# Patient Record
Sex: Male | Born: 1970 | Race: White | Hispanic: No | Smoking: Current every day smoker
Health system: Southern US, Community
[De-identification: ages and names within clinical notes are randomized; demographics above are authoritative.]

## PROBLEM LIST (undated history)

## (undated) DIAGNOSIS — F4310 Post-traumatic stress disorder, unspecified: Secondary | ICD-10-CM

## (undated) DIAGNOSIS — F17200 Nicotine dependence, unspecified, uncomplicated: Secondary | ICD-10-CM

## (undated) DIAGNOSIS — F419 Anxiety disorder, unspecified: Secondary | ICD-10-CM

## (undated) DIAGNOSIS — F32A Depression, unspecified: Secondary | ICD-10-CM

## (undated) DIAGNOSIS — F3181 Bipolar II disorder: Secondary | ICD-10-CM

## (undated) DIAGNOSIS — F1010 Alcohol abuse, uncomplicated: Secondary | ICD-10-CM

## (undated) DIAGNOSIS — F329 Major depressive disorder, single episode, unspecified: Secondary | ICD-10-CM

## (undated) HISTORY — PX: LYMPH NODE DISSECTION: SHX5087

## (undated) HISTORY — PX: HERNIA REPAIR: SHX51

---

## 2002-08-25 ENCOUNTER — Emergency Department (HOSPITAL_COMMUNITY): Admission: EM | Admit: 2002-08-25 | Discharge: 2002-08-25 | Payer: Self-pay | Admitting: Emergency Medicine

## 2003-04-25 ENCOUNTER — Encounter: Payer: Self-pay | Admitting: Emergency Medicine

## 2003-04-25 ENCOUNTER — Emergency Department (HOSPITAL_COMMUNITY): Admission: EM | Admit: 2003-04-25 | Discharge: 2003-04-25 | Payer: Self-pay | Admitting: Emergency Medicine

## 2004-11-06 ENCOUNTER — Emergency Department (HOSPITAL_COMMUNITY): Admission: EM | Admit: 2004-11-06 | Discharge: 2004-11-06 | Payer: Self-pay | Admitting: Emergency Medicine

## 2005-08-25 ENCOUNTER — Inpatient Hospital Stay (HOSPITAL_COMMUNITY): Admission: RE | Admit: 2005-08-25 | Discharge: 2005-08-27 | Payer: Self-pay | Admitting: Psychiatry

## 2005-08-25 ENCOUNTER — Emergency Department (HOSPITAL_COMMUNITY): Admission: EM | Admit: 2005-08-25 | Discharge: 2005-08-25 | Payer: Self-pay | Admitting: Emergency Medicine

## 2005-09-01 ENCOUNTER — Other Ambulatory Visit (HOSPITAL_COMMUNITY): Admission: RE | Admit: 2005-09-01 | Discharge: 2005-09-25 | Payer: Self-pay | Admitting: Psychiatry

## 2005-09-01 ENCOUNTER — Ambulatory Visit: Payer: Self-pay | Admitting: Psychiatry

## 2009-02-07 ENCOUNTER — Ambulatory Visit (HOSPITAL_COMMUNITY): Admission: RE | Admit: 2009-02-07 | Discharge: 2009-02-07 | Payer: Self-pay | Admitting: General Surgery

## 2009-06-04 ENCOUNTER — Inpatient Hospital Stay (HOSPITAL_COMMUNITY): Admission: RE | Admit: 2009-06-04 | Discharge: 2009-06-05 | Payer: Self-pay | Admitting: Psychiatry

## 2009-06-04 ENCOUNTER — Ambulatory Visit: Payer: Self-pay | Admitting: Psychiatry

## 2009-06-04 ENCOUNTER — Emergency Department (HOSPITAL_COMMUNITY): Admission: EM | Admit: 2009-06-04 | Discharge: 2009-06-04 | Payer: Self-pay | Admitting: Emergency Medicine

## 2009-10-24 ENCOUNTER — Ambulatory Visit (HOSPITAL_COMMUNITY): Payer: Self-pay | Admitting: Psychiatry

## 2010-03-15 ENCOUNTER — Ambulatory Visit: Payer: Self-pay | Admitting: Psychiatry

## 2010-12-23 ENCOUNTER — Inpatient Hospital Stay (HOSPITAL_COMMUNITY)
Admission: EM | Admit: 2010-12-23 | Discharge: 2010-12-25 | DRG: 897 | Disposition: A | Discharge status: Other Acute Inpatient Hospital | Payer: 59 | Attending: Internal Medicine | Admitting: Internal Medicine

## 2010-12-23 DIAGNOSIS — R45851 Suicidal ideations: Secondary | ICD-10-CM

## 2010-12-23 DIAGNOSIS — E871 Hypo-osmolality and hyponatremia: Secondary | ICD-10-CM | POA: Diagnosis present

## 2010-12-23 DIAGNOSIS — F10939 Alcohol use, unspecified with withdrawal, unspecified: Principal | ICD-10-CM | POA: Diagnosis present

## 2010-12-23 DIAGNOSIS — F10239 Alcohol dependence with withdrawal, unspecified: Principal | ICD-10-CM | POA: Diagnosis present

## 2010-12-23 DIAGNOSIS — F39 Unspecified mood [affective] disorder: Secondary | ICD-10-CM | POA: Diagnosis present

## 2010-12-23 DIAGNOSIS — F1020 Alcohol dependence, uncomplicated: Secondary | ICD-10-CM | POA: Diagnosis present

## 2010-12-23 LAB — DIFFERENTIAL
Basophils Absolute: 0 10*3/uL (ref 0.0–0.1)
Eosinophils Absolute: 0 10*3/uL (ref 0.0–0.7)
Eosinophils Relative: 0 % (ref 0–5)
Lymphocytes Relative: 12 % (ref 12–46)
Lymphs Abs: 1.3 10*3/uL (ref 0.7–4.0)
Monocytes Absolute: 0.5 10*3/uL (ref 0.1–1.0)
Monocytes Relative: 4 % (ref 3–12)

## 2010-12-23 LAB — CBC
HCT: 42.9 % (ref 39.0–52.0)
MCH: 27.5 pg (ref 26.0–34.0)
MCHC: 35 g/dL (ref 30.0–36.0)
MCV: 78.6 fL (ref 78.0–100.0)
Platelets: 211 10*3/uL (ref 150–400)
RBC: 5.46 MIL/uL (ref 4.22–5.81)
RDW: 14.6 % (ref 11.5–15.5)

## 2010-12-23 LAB — COMPREHENSIVE METABOLIC PANEL
ALT: 58 U/L — ABNORMAL HIGH (ref 0–53)
Albumin: 4.5 g/dL (ref 3.5–5.2)
Alkaline Phosphatase: 63 U/L (ref 39–117)
Calcium: 9.2 mg/dL (ref 8.4–10.5)
Creatinine, Ser: 0.79 mg/dL (ref 0.4–1.5)
GFR calc Af Amer: >60 mL/min (ref >60)
GFR calc non Af Amer: >60 mL/min (ref >60)
Potassium: 4 mEq/L (ref 3.5–5.1)
Sodium: 133 mEq/L — ABNORMAL LOW (ref 135–145)
Total Protein: 7.9 g/dL (ref 6.0–8.3)

## 2010-12-23 LAB — POCT CARDIAC MARKERS
CKMB, poc: 1.5 ng/mL (ref 1.0–8.0)
Myoglobin, poc: 76.6 ng/mL (ref 12–200)
Troponin i, poc: <0.05 ng/mL (ref 0.00–0.09)

## 2010-12-23 LAB — RAPID URINE DRUG SCREEN, HOSP PERFORMED
Amphetamines: NOT DETECTED
Benzodiazepines: POSITIVE — AB
Tetrahydrocannabinol: POSITIVE — AB

## 2010-12-23 LAB — ETHANOL: Alcohol, Ethyl (B): 5 mg/dL (ref 0–10)

## 2010-12-24 DIAGNOSIS — F1020 Alcohol dependence, uncomplicated: Secondary | ICD-10-CM

## 2010-12-24 LAB — BASIC METABOLIC PANEL
BUN: 9 mg/dL (ref 6–23)
CO2: 29 mEq/L (ref 19–32)
Calcium: 8.8 mg/dL (ref 8.4–10.5)
Creatinine, Ser: 0.77 mg/dL (ref 0.4–1.5)
GFR calc Af Amer: >60 mL/min (ref >60)
GFR calc non Af Amer: >60 mL/min (ref >60)
Potassium: 3.8 mEq/L (ref 3.5–5.1)
Sodium: 141 mEq/L (ref 135–145)

## 2010-12-24 LAB — LIPID PANEL
Cholesterol: 253 mg/dL — ABNORMAL HIGH (ref 0–200)
HDL: 84 mg/dL (ref >39)
LDL Cholesterol: 156 mg/dL — ABNORMAL HIGH (ref 0–99)
Total CHOL/HDL Ratio: 3 RATIO
Triglycerides: 63 mg/dL (ref <150)

## 2010-12-24 LAB — PHOSPHORUS: Phosphorus: 2.9 mg/dL (ref 2.3–4.6)

## 2010-12-24 LAB — TSH: TSH: 2.992 u[IU]/mL (ref 0.350–4.500)

## 2010-12-24 LAB — MAGNESIUM: Magnesium: 2.3 mg/dL (ref 1.5–2.5)

## 2010-12-24 LAB — VITAMIN B12: Vitamin B-12: 422 pg/mL (ref 211–911)

## 2010-12-24 NOTE — H&P (Signed)
[name], [name]            ACCOUNT NO.:  [id number]  MEDICAL RECORD NO.:  [id number]           PATIENT TYPE:  E  LOCATION:  [facility]                         FACILITY:  [facility]  PHYSICIAN:  Homero Fellers, MD   DATE OF BIRTH:  January 22, 1971  DATE OF ADMISSION:  12/23/2010 DATE OF DISCHARGE:                             HISTORY & PHYSICAL   PRIMARY CARE PHYSICIAN:  Thora Lance, MD  CHIEF COMPLAINT:  Wanting to detox from alcohol and suicide ideation.  This is a 40 year old Caucasian gentleman with heavy history of alcohol abuse.  The patient stated that he typically drinks constantly on a daily basis for 3 weeks in a row and then may be off for several weeks. He drinks about 30 beers a day and has been doing that for the past 2- 1/2 weeks.  Yesterday, he planned to kill himself by just drinking to death.  He said he planned to just drink until he just falls off and die.  Denies any use of weapon or any other organized plan like hanging or overdose of medication.  The patient had prior history of alcohol withdrawal before several months ago.  In the emergency room, he was given several doses of Ativan, totalling about 60 mg in the past 8 hours with some improvement of his symptoms but not completely resolved.  He continued to be tachycardic, agitated, anxious and is beginning to be tremulous.  He was already seen by the Behavioral Medicine Team and as such is not going to look for placement, but because of his daily features of alcohol withdrawal, we have been asked to admit to the Medical Service for management.  The patient complained of vague chest pain, no shortness of breath.  He had some nausea and vomiting with loose stool in the past 1-2 days, but there has been no vomiting since he got to the emergency room.  The patient had no seizure activity.  No leg swelling or palpitation.  Past medical history include alcohol abuse.  MEDICATIONS:  None.  ALLERGIES:   None.  SOCIAL HISTORY:  Currently smokes about half pack a per day, also uses marijuana.  He drinks alcohol as described above.  FAMILY HISTORY:  Unremarkable.  Ten-point review of systems is negative except as described above.  PHYSICAL EXAMINATION:  VITAL SIGNS:  Blood pressure 138/82 to 138/103, pulse of 105-119, respirations 24, temperature is 98.5, O2 sat 98%. GENERAL:  The patient is lying in bed, comfortable, appeared to be in no distress.  He appears slightly anxious and tremulous.  He is awake, alert, and oriented.  No hallucination or aggressive behavior. NECK:  Supple.  No JVD. LUNGS:  Clear to auscultation bilaterally. HEART:  S1 and S2.  No murmurs, rubs, or gallops. ABDOMEN:  Full, soft, nontender.  Bowel sounds present.  No masses. GI:  No edema, clubbing, or cyanosis. NEUROLOGIC:  No focal deficits.  Speech is clear.  Coordination appeared preserved.  He has mild tremor to both hands.  LABORATORY:  Sodium is 133, potassium 4, BUN 11, creatinine 0.79.  AST 56, ALT 58, alcohol level 5.  White count is 10.9, hemoglobin  is 15.0, platelet count is 211,000.  Cardiac enzymes normal.  ASSESSMENT: 1. Alcohol withdrawal. 2. Suicidal ideation. 3. Mild hyponatremia. 4. History of cannabis abuse.  PLAN:  Admit to telemetry.  I will put him on alcohol withdrawal protocol using Ativan and Librium.  He will also be on multivitamin, folic acid, and thiamine.  Vital signs to be closely followed.  The patient will also be on DVT prophylaxis.  I will use hydralazine for elevated blood pressure if condition is fairly stable.  He will definitely need alcohol rehabilitation once he is medically stable.     Homero Fellers, MD     FA/MEDQ  D:  12/23/2010  T:  12/23/2010  Job:  [id number]  Electronically Signed by Homero Fellers  on 12/24/2010 02:20:53 AM

## 2010-12-25 ENCOUNTER — Inpatient Hospital Stay (HOSPITAL_COMMUNITY)
Admission: RE | Admit: 2010-12-25 | Discharge: 2010-12-26 | DRG: 897 | Disposition: A | Discharge status: Home / Self Care | Payer: 59 | Source: Ambulatory Visit | Attending: Psychiatry | Admitting: Psychiatry

## 2010-12-25 DIAGNOSIS — F329 Major depressive disorder, single episode, unspecified: Secondary | ICD-10-CM

## 2010-12-25 DIAGNOSIS — F1210 Cannabis abuse, uncomplicated: Secondary | ICD-10-CM

## 2010-12-25 DIAGNOSIS — F3289 Other specified depressive episodes: Secondary | ICD-10-CM

## 2010-12-25 DIAGNOSIS — F1020 Alcohol dependence, uncomplicated: Principal | ICD-10-CM

## 2010-12-25 DIAGNOSIS — Z560 Unemployment, unspecified: Secondary | ICD-10-CM

## 2010-12-25 DIAGNOSIS — R45851 Suicidal ideations: Secondary | ICD-10-CM

## 2010-12-26 DIAGNOSIS — F1020 Alcohol dependence, uncomplicated: Secondary | ICD-10-CM

## 2010-12-26 NOTE — Discharge Summary (Signed)
[name], [name]            ACCOUNT NO.:  [id number]  MEDICAL RECORD NO.:  [id number]           PATIENT TYPE:  I  LOCATION:  3701                         FACILITY:  [facility]  PHYSICIAN:  Thora Lance, M.D.  DATE OF BIRTH:  05-May-1971  DATE OF ADMISSION:  12/23/2010 DATE OF DISCHARGE:  12/25/2010                              DISCHARGE SUMMARY   REASON FOR ADMISSION:  A 40 year old Caucasian male with history of heavy alcohol abuse who had been drinking up to 30 beers a day over the last 3 weeks prior to admission.  He reported he had planned to kill himself by drinking himself to death.  He does have a history of prior alcohol withdrawal in the past.  In the emergency room, he was given as much as 60 mg of Ativan, but still had significant tachycardia and agitation and anxiety and was admitted for alcohol detox after being seen by Behavioral Medicine.  PHYSICAL EXAMINATION:  Significant findings on exam; VITAL SIGNS:  Blood pressure 138/82, to 138/103, heart rate 105-119, respirations 24, and temperature 98.5. NECK:  Supple.  No JVD. LUNGS:  Were clear. HEART:  Regular rate and rhythm without murmur, gallop, or rub. ABDOMEN:  Soft, nontender.  Normal bowel sounds.  No masses. EXTREMITIES:  No edema. NEUROLOGIC:  No focal deficits.  LABORATORY DATA:  Sodium 133, potassium 4, BUN 11, and creatinine 0.7. AST 56 and ALT 58.  Alcohol level 5.  White count 10.9, hemoglobin 15, and platelet count 211,000.  Cardiac enzymes normal.  HOSPITAL COURSE:  The patient was admitted for alcohol withdrawal detox. He was placed on the alcohol detox protocol using Ativan.  This was supplemented with some p.m. Librium by Psychiatry.  He was given multivitamin, folic acid, and thiamine.  His blood pressure was initially elevated, but at discharge his vital signs had stabilized with last blood pressure 111/66 with a heart rate of 77.  He remained afebrile.  Folate level came back to 6.679  with is high.  B12 level 422. TSH 2.9.  The patient was seen by the Psychiatric Service.  The impression was chronic alcohol dependence and mood disorder, not otherwise specified.  Rule out bipolar disorder, which had been a diagnosis in the past.  Ativan detox protocol was continued, Librium was added at bedtime, and treatment options were discussed with the patient. The patient was transferred to [facility] for further stabilization and then from there would be transferred to rehab.  The patient was medically stable at the time of discharge, there was no significant medical issues.  DISCHARGE DIAGNOSES: 1. Alcoholism. 2. Alcohol withdrawal. 3. Mood disorder, rule out bipolar disorder.  PROCEDURES:  None.  DISCHARGE MEDICATIONS: 1. Multivitamin 1 p.o. daily. 2. Thiamine 100 mg one p.o. daily. 3. Folic acid 1 mg p.o. daily. 4. Psychiatric meds as per [facility].  DISPOSITION:  Discharge to [facility].  CODE STATUS:  Full code.  DIET:  Regular diet.  ACTIVITY:  As tolerated.         ______________________________ Thora Lance, M.D.     JJG/MEDQ  D:  12/25/2010  T:  12/25/2010  Job:  [id number]  Electronically Signed by Kirby Funk M.D. on 12/26/2010 06:26:11 PM

## 2010-12-28 NOTE — Consult Note (Signed)
[name], [name]            ACCOUNT NO.:  [id number]  MEDICAL RECORD NO.:  [id number]           PATIENT TYPE:  I  LOCATION:  3701                         FACILITY:  [facility]  PHYSICIAN:  Eulogio Ditch, MD DATE OF BIRTH:  Oct 05, 1971  DATE OF CONSULTATION:  12/24/2010 DATE OF DISCHARGE:                                CONSULTATION   REASON FOR CONSULTATION:  Alcohol detox and history of bipolar disorder.  HISTORY OF PRESENT ILLNESS:  A 40 year old white male with a long history of alcohol dependence, admitted on the medical floor for detox from alcohol.  The patient also verbalized suicidal ideation at the time of admission and he reported that he was planning to kill himself by just drinking.  The patient reported stress at job, problems with the wife.  He is married for 17 years, has 40 boy, a 39 year old.  Binge drinking.  The patient has financial and marital problems.  The patient is very logical and goal directed during interview, still has suicidal ideations but denies hearing any voices, is not delusional, no manic symptoms present.  Reported sleep and appetite fair.  The patient has gone into rehab in the past, 6 years ago, but remained sober only for 6 months.  He told me he remained sober for 6 months and then 1 year and then he started drinking again.  The patient was admitted in the past at [facility] under Dr. Dub Mikes and was on Lamictal.  He told me that he took Lamictal for 6 months and then later on he was on Seroquel but he did not like both medications and then he stopped taking the medication.  Also, he told me that Dr. Dub Mikes thought that his problem is not bipolar, his problem is alcohol abuse and psychosocial stressors.  MEDICAL ISSUES:  No active medical issue.  ALLERGIES:  No known drug allergies.  WITHDRAWAL SYMPTOMS:  The patient is currently not having any withdrawal symptoms.  He is on Ativan detox protocol.  The patient is not having any  tremors in the hands.  No psychotic symptoms present.  No seizure episode reported.  MENTAL STATUS EXAMINATION:  The patient is calm, cooperative during the interview.  Fair eye contact.  No psychomotor agitation, retardation noted during the interview.  Mood depressed, anxious.  Affect mood congruent.  Thought process logical and goal directed.  Thought content, the patient still has suicidal ideations.  Thought perception, no audiovisual hallucination reported, not internally preoccupied. Cognition, alert, awake, oriented x3.  Memory immediate, recent, remote intact.  Attention and concentration fair.  Abstraction ability fair. Fund of knowledge fair.  Insight and judgment poor.  DIAGNOSES:  Axis I:  Chronic alcohol dependence.  Mood disorder, not otherwise specified, rule out bipolar disorder. Axis II:  Deferred. Axis III:  See medical notes. Axis IV:  Chronic alcohol abuse. Axis V:  30 to 40  RECOMMENDATIONS: 1. The patient can be continued on Ativan detox protocol. 2. I added Librium 25 mg at bedtime for 3 days.3. The patient can be continued on folic acid 1 mg p.o. daily and     thiamine 100 mg p.o. daily. 4.  I discussed different treatment options with the patient.  The     patient agrees to go to [facility] for rehab or he is willing to come to     [facility] for further stabilization and then from there he     can go to rehab, but I told the patient that he should speak with     his wife and let us know which way he wants to go. 5. The patient told me that he thinks he will get benefit by going to     the [facility] at [facility]. 6. We will follow up on this patient for further treatment. 7. I will not start any medications for his mood lability at this     time.  Thanks for involving me in taking care of this patient.     Eulogio Ditch, MD     SA/MEDQ  D:  12/24/2010  T:  12/24/2010  Job:  [id number]  Electronically Signed by Eulogio Ditch  on 12/28/2010  07:17:54 AM

## 2010-12-29 NOTE — H&P (Signed)
[name], [name]            ACCOUNT NO.:  [id number]  MEDICAL RECORD NO.:  [id number]           PATIENT TYPE:  I  LOCATION:  0306                          FACILITY:  [facility]  PHYSICIAN:  Anselm Jungling, MD  DATE OF BIRTH:  Jan 25, 1971  DATE OF ADMISSION:  12/25/2010 DATE OF DISCHARGE:                      PSYCHIATRIC ADMISSION ASSESSMENT   IDENTIFYING INFORMATION:  A 40 year old male, married.  This is a voluntary admission.  HISTORY OF THE PRESENT ILLNESS:  Third [facility] admission for Javanni who has a history of polysubstance abuse and binge-pattern alcohol abuse.  He presents by way of our emergency room where he reported suicidal thoughts with a plan to kill myself by drinking himself to death.  He says that he has been binging heavily for the past 2 weeks and drank 30 beers on the day prior to presentation.  He endorses financial and marital problems but displayed no delusional or manic thinking.  He was initially admitted to our medical unit from March 19th to the 21st for alcohol detox since he initially displayed significant tachycardia, anxiety and agitation in the emergency room.  Today, he denies active suicidal thoughts.  Please see the psychiatric consultation done by Dr. Rogers Blocker on the medical unit on December 24, 2010.  PAST PSYCHIATRIC HISTORY:  Previous admissions to [facility] in 2006 for polysubstance abuse including cocaine abuse.  He participated in the [facility] chemical dependency intensive outpatient program in the past but did not complete it.  He had also attended a substance abuse rehab program 6 years ago but remained sober for only 6 months.  He has been previously diagnosed with bipolar II disorder, and his past medication trials include Wellbutrin and Lamictal.  He does have a history of one DWI charge.  He has no current legal charges.  SOCIAL HISTORY:  Married Caucasian male, unemployed.  Endorsing financial and marital stressors.  Lives at home with  wife and children.  FAMILY HISTORY:  Denies a family history of mental illness or substance abuse.  MEDICAL HISTORY:  Primary care physician is Dr. Kirby Funk. Chronic medical problems include cannabis abuse, alcohol abuse.  No known history of seizures or hepatitis or delirium.  PHYSICAL EXAM:  Done in the emergency room and is noted in the record. This is a normally developed Caucasian male in no acute distress. Admitting vital signs:  Temperature 98.3, pulse 77, respirations 18, blood pressure 111/66.  CBC:  WBC 10.9, hemoglobin 15, hematocrit 42.9, platelets 211,000.  Chemistry:  Sodium 141, potassium 3.8, chloride 107, carbon dioxide 29, BUN 9, creatinine 0.77.  Hepatic function:  SGOT 56, SGPT 58, alkaline phosphatase 63, total bilirubin 1.0.  Cardiac enzymes were negative.  Cholesterol was noted to be elevated.  Total cholesterol elevated at 253 with an LDL of 156.  B12, folate and TSH all normal. Urine drug screen positive for benzodiazepines and marijuana.  Alcohol level 5. MENTAL STATUS EXAM:  Fully alert male.  Cooperative.  No acute withdrawal symptoms today.  Endorsing some passive suicidal thoughts. No active suicidal thoughts.  Vital signs have normalized.  Pulse today 73, blood pressure 119/76.  Cognition intact.  No signs of delirium.  Thinking nonpsychotic.  Memory intact.  Fully oriented.  No signs of delirium or confusion.  Axis I:  Alcohol abuse and dependence.  Cannabis abuse. Axis II:  No diagnosis. Axis III:  No diagnosis. Axis IV:  Significant financial and marital discord. Axis V:  Current 42.  Past year not known.  PLAN:  Voluntarily admit him with the goal of a safe detox.  We started him on a Librium protocol, but he has already received a couple days over in the emergency room, so we will start today with Librium 25 mg b.i.d. for 2 days then 25 mg q.h.s. for 2 days, and he will have completed the protocol.  We plan on involving his family in  his treatment.     Margaret A. Lorin Picket, N.P.   ______________________________ Anselm Jungling, MD    MAS/MEDQ  D:  12/26/2010  T:  12/26/2010  Job:  [id number]  Electronically Signed by Kari Baars N.P. on 12/27/2010 03:43:53 PM Electronically Signed by Geralyn Flash MD on 12/29/2010 11:38:30 AM

## 2011-01-11 LAB — COMPREHENSIVE METABOLIC PANEL
ALT: 82 U/L — ABNORMAL HIGH (ref 0–53)
AST: 98 U/L — ABNORMAL HIGH (ref 0–37)
Albumin: 4.7 g/dL (ref 3.5–5.2)
Alkaline Phosphatase: 65 U/L (ref 39–117)
BUN: 9 mg/dL (ref 6–23)
CO2: 21 mEq/L (ref 19–32)
Calcium: 10 mg/dL (ref 8.4–10.5)
Chloride: 95 mEq/L — ABNORMAL LOW (ref 96–112)
Creatinine, Ser: 0.88 mg/dL (ref 0.4–1.5)
GFR calc Af Amer: >60 mL/min (ref >60)
GFR calc non Af Amer: >60 mL/min (ref >60)
Glucose, Bld: 118 mg/dL — ABNORMAL HIGH (ref 70–99)
Potassium: 3.9 mEq/L (ref 3.5–5.1)
Sodium: 134 mEq/L — ABNORMAL LOW (ref 135–145)
Total Bilirubin: 1.3 mg/dL — ABNORMAL HIGH (ref 0.3–1.2)
Total Protein: 8.1 g/dL (ref 6.0–8.3)

## 2011-01-11 LAB — RAPID URINE DRUG SCREEN, HOSP PERFORMED
Amphetamines: NOT DETECTED
Barbiturates: NOT DETECTED
Benzodiazepines: POSITIVE — AB
Cocaine: NOT DETECTED
Opiates: NOT DETECTED
Tetrahydrocannabinol: POSITIVE — AB

## 2011-01-11 LAB — LIPASE, BLOOD: Lipase: 23 U/L (ref 11–59)

## 2011-01-11 LAB — ETHANOL: Alcohol, Ethyl (B): 6 mg/dL (ref 0–10)

## 2011-01-14 LAB — DIFFERENTIAL
Basophils Absolute: 0 10*3/uL (ref 0.0–0.1)
Basophils Relative: 1 % (ref 0–1)
Eosinophils Absolute: 0.1 10*3/uL (ref 0.0–0.7)
Eosinophils Relative: 3 % (ref 0–5)
Lymphocytes Relative: 39 % (ref 12–46)
Lymphs Abs: 2.2 10*3/uL (ref 0.7–4.0)
Monocytes Absolute: 0.5 10*3/uL (ref 0.1–1.0)
Monocytes Relative: 10 % (ref 3–12)
Neutro Abs: 2.8 10*3/uL (ref 1.7–7.7)
Neutrophils Relative %: 48 % (ref 43–77)

## 2011-01-14 LAB — CBC
HCT: 45.4 % (ref 39.0–52.0)
Hemoglobin: 15.4 g/dL (ref 13.0–17.0)
MCHC: 33.8 g/dL (ref 30.0–36.0)
MCV: 84.2 fL (ref 78.0–100.0)
Platelets: 257 10*3/uL (ref 150–400)
RBC: 5.39 MIL/uL (ref 4.22–5.81)
RDW: 14.8 % (ref 11.5–15.5)
WBC: 5.6 10*3/uL (ref 4.0–10.5)

## 2011-01-14 LAB — BASIC METABOLIC PANEL
BUN: 8 mg/dL (ref 6–23)
CO2: 28 mEq/L (ref 19–32)
Calcium: 9.2 mg/dL (ref 8.4–10.5)
Chloride: 111 mEq/L (ref 96–112)
Creatinine, Ser: 0.92 mg/dL (ref 0.4–1.5)
GFR calc Af Amer: >60 mL/min (ref >60)
GFR calc non Af Amer: >60 mL/min (ref >60)
Glucose, Bld: 106 mg/dL — ABNORMAL HIGH (ref 70–99)
Potassium: 4.4 mEq/L (ref 3.5–5.1)
Sodium: 144 mEq/L (ref 135–145)

## 2011-02-18 NOTE — Op Note (Signed)
[name]            ACCOUNT NO.:  [id number]   MEDICAL RECORD NO.:  [id number]          PATIENT TYPE:  AMB   LOCATION:  DAY                          FACILITY:  [facility]   PHYSICIAN:  Juanetta Gosling, MDDATE OF BIRTH:  04/11/1971   DATE OF PROCEDURE:  02/07/2009  DATE OF DISCHARGE:                               OPERATIVE REPORT   PREOPERATIVE DIAGNOSIS:  Recurrent right inguinal hernia.   POSTOPERATIVE DIAGNOSIS:  Direct right inguinal hernia.   PROCEDURE:  Laparoscopic right inguinal hernia repair with Ultrapro  mesh.   SURGEON:  Juanetta Gosling, M.D.   ANESTHESIA:  General.   FINDINGS:  Direct inguinal hernia.  No indirect inguinal hernia found.   SPECIMENS:  None.   DRAINS:  None.   ESTIMATED BLOOD LOSS:  Minimal.   COMPLICATIONS:  None.   DISPOSITION:  To recovery room in stable condition.   SUPERVISING ANESTHESIOLOGIST:  Brayton Caves, M.D.   HISTORY:  This is a 40 year old male with a history of right inguinal  hernia repair with mesh in the early 90's who presents for a right groin  bulge for over a year.  This has begun to bother him over time.  On his  exam he had a well-healed right groin incision and an easily reducible  right inguinal hernia.  We discussed a laparoscopic possible open repair  of this right inguinal hernia.   PROCEDURE:  After informed consent was obtained, the patient was taken  to the operating room.  He had 1 gm of intravenous cefazolin  administered as well as sequential compression devices placed on his  lower extremities prior to operation.  He then was placed under general  endotracheal anesthesia without complication.  His abdomen and groins  were then prepped and draped in a standard sterile surgical fashion.  A  Foley catheter was placed without complication.  A surgical time-out was  then performed.   A 10 mm incision was then made below the umbilicus.  Dissection was  carried out down to the level of the  anterior fascia.  This was incised  sharply.  The rectus abdominis was pulled laterally on the left side,  and this preperitoneal space was then entered.  The balloon dissector  was then inserted and insufflated to 30 pumps.  This was held for 2  minutes.  Following this, this was then removed, and the preperitoneal  space was then insufflated. The space was created pretty well.  The  camera was then placed.  I placed initial two 5-mm trocars in the mid  clavicular line on both sides.  He very clearly had a direct inguinal  hernia that reduced upon insufflation.  His vessels were noted and  remained out of our field during the procedure.  The cord structures  were difficult to encircle.  There was a fair amount of scarring, and I  think he may have had a plug placed in this previously.  Eventually,  these structures were dissected, and I was able to identify that he did  not have an indirect inguinal hernia.  I did dissect laterally out to  the anterior superior iliac spine, which was still scarred from where it  appeared that he had a plug placed in this previously.  This space was  then developed to my satisfaction.  I then cut a 6 x 6 piece of Ultrapro  mesh over the corners and then inserted this mesh into the space.  The  Ultrapro was then placed into position laterally.  It was tacked twice  to the abdominal wall superiorly.  There were 2 tacks placed to Cooper's  ligament.  I also placed several tacks right above where his direct  hernia was in order to get plenty of overlap and make sure this stayed  in good position.  His mesh appeared in good position.  The peritoneum  was then placed on top of the mesh, and the abdomen was then  desufflated.  All trocars were then removed.  The fascia was closed with  a 2-0 Vicryl.  The skin was closed with a 4-0 Monocryl, and all the  incisions and Dermabond were placed all over these wounds.  He tolerated  this well and was extubated in the  operating room.  His Foley catheter  was removed.  He was transferred to recovery in stable condition.      Juanetta Gosling, MD  Electronically Signed     MCW/MEDQ  D:  02/07/2009  T:  02/07/2009  Job:  [id number]   cc:   Thora Lance, M.D.  Fax: [phone]

## 2011-02-18 NOTE — H&P (Signed)
[name], [name]            ACCOUNT NO.:  [id number]   MEDICAL RECORD NO.:  [id number]          PATIENT TYPE:  IPS   LOCATION:  0501                          FACILITY:  [facility]   PHYSICIAN:  Geoffery Lyons, M.D.      DATE OF BIRTH:  01/31/1971   DATE OF ADMISSION:  06/04/2009  DATE OF DISCHARGE:                       PSYCHIATRIC ADMISSION ASSESSMENT   This is on a 40 year old male voluntarily admitted on June 04, 2009.  The patient reports a history of binge drinking, has been binging for  the past 9 days.  States he binge drinks for about 1 week about every 3  to 4 weeks.  Normally drinks about a 12-pack to a case.  Last drink was  on Saturday.  He denies any suicidal thoughts.  He denies any seizure  activity and does smoke some marijuana on a recreational basis.  He  states he has been having some physical symptoms related to the alcohol  use, wants to get help.  Patient reports he has had a poor appetite for  5 days and did experience tremors, chills, nausea, vomiting at home.   PAST PSYCHIATRIC HISTORY:  The patient was here in 2006.  This is the  second visit.  No current outpatient mental health treatment.  Has been  on Lamictal in the past and states he may have a history of bipolar  disorder.   SOCIAL HISTORY:  A 40 year old male who lives in [place].  He is  married.  He has a 51 year old son.   FAMILY HISTORY:  Father with bipolar disorder and alcohol problems.   ALCOHOL/DRUG HISTORY:  He denies any seizures.  No DTs.  Smokes  marijuana on a recreational basis.   PRIMARY CARE [name]:  Dr. Valentina Lucks.   MEDICAL PROBLEMS:  He denies any acute or chronic health issues.   MEDICATIONS:  None.   DRUG ALLERGIES:  No known allergies.   PHYSICAL EXAMINATION:  Reviewed and noted the patient was well-  developed, well-nourished and agitated, anxious-appearing.  Abdomen was soft and nontender, nondistended.   Laboratory data shows a sodium of 134, chloride of 95,  glucose of 118.  Liver enzymes show an SGOT of 98 and SGPT of 82, total bilirubin is  mildly elevated at 1.3.  Alcohol level was 6.  Lipase of 23.   The patient received IV Ativan for alcohol withdrawal, fluids, and a  banana bag with some improvement in symptoms.   MENTAL STATUS EXAM:  The patient is fully alert and cooperative with  good eye contact.  He seems to appear somewhat anxious and complaining  of some tremors.  Speech is clear.  He is very polite and asked  appropriate questions.  Thought processes are coherent and goal  directed.  No delusional state.  Cognitive function intact.  Judgment  and insight appear to be good.  Poor impulse control related to alcohol  use.   DIAGNOSES:   AXIS I:  Alcohol dependence, cannabis abuse.   AXIS II:  Deferred.   AXIS III:  He has no known medical conditions.   AXIS IV:  Psychosocial problems related to chronic alcohol  use.   AXIS V:  Current is 45-50   Our plan is to detox patient on Librium protocol.  Work on relapse  prevention.  We will continue to assess comorbidities.  We will have  trazodone available for sleep.  The patient will be in the red group.  The patient is interest in the [facility] program, and case manager will  assess the patient's potential for attending.   Estimated length of stay is 3 to 5 days.      Landry Corporal, N.P.      Geoffery Lyons, M.D.  Electronically Signed    JO/MEDQ  D:  06/05/2009  T:  06/05/2009  Job:  [id number]

## 2011-02-21 NOTE — Discharge Summary (Signed)
[name], [name]            ACCOUNT NO.:  [id number]   MEDICAL RECORD NO.:  [id number]          PATIENT TYPE:  IPS   LOCATION:  0303                          FACILITY:  [facility]   PHYSICIAN:  Jeanice Lim, M.D. DATE OF BIRTH:  Nov 13, 1970   DATE OF ADMISSION:  08/25/2005  DATE OF DISCHARGE:  08/27/2005                                 DISCHARGE SUMMARY   IDENTIFYING DATA:  This is a 40 year old married Caucasian male with a  history of anxiety and alcohol abuse, increased depression and mood swings,  tearful, drinking with recent cocaine use, not sleeping well.  Denied  suicidal or homicidal ideation.  First [facility] admission.  History of a DUI.  Positive family history of bipolar.   MEDICATIONS:  The patient had been tried on Wellbutrin in the past.  None at  this time.   ALLERGIES:  No known drug allergies.   PHYSICAL EXAMINATION:  Physical and neurologic exam essentially within  normal limits.  The patient appearing healthy.   MENTAL STATUS EXAM:  Sleepy young male.  Alert, no distress.  Speech clear,  articulate.  Mood depressed, sleepy and affect slightly restricted.  Thought  processes goal directed.  No psychotic symptoms.  Denied acute dangerous  ideation.  Cognitively intact.  Judgment and insight were poor.   ADMISSION DIAGNOSES:  AXIS I:  Mood disorder not otherwise specified.  Rule  out bipolar disorder, type 2.  Alcohol abuse versus dependence.  Possible  alcohol-induced mood disorder superimposed on bipolar disorder, type 2.  AXIS II:  Deferred.  AXIS III:  None.  AXIS IV:  Moderate (stressors with limited support system and other  psychosocial issues).  AXIS V:  30/65.   HOSPITAL COURSE:  The patient was admitted and ordered routine p.r.n.  medications and underwent further monitoring.  Was encouraged to participate  in individual, group and milieu therapy.  Risk/benefit ratio and alternative  treatments regarding medications were reviewed  including risk of rash with  Lamictal and Steven-Johnson syndrome and instructions on safety and patient  was agreeable to start medications.  The patient responded to crisis  intervention.   CONDITION ON DISCHARGE:  Discharged in improved condition, tolerating  medications without side effects.  The patient was discharged and medication  education again reviewed.   DISCHARGE MEDICATIONS:  1.  Trazodone 50 mg q.h.s. p.r.n. insomnia.  2.  Librium 25 mg b.i.d. for two days, then 25 mg at 8 p.m. for two days and      then stop.  3.  Lamictal 25 mg in the morning for one week, then 50 mg in the morning.  4.  Vistaril 50 mg q.8h. p.r.n. anxiety.   FOLLOW UP:  The patient was agreeable to follow up with [facility] IOP at [facility] outpatient on September 01, 2005 at 11:30 a.m.   DISCHARGE DIAGNOSES:  AXIS I:  Mood disorder not otherwise specified.  Rule  out bipolar disorder, type 2.  Alcohol abuse versus dependence.  Possible  alcohol-induced mood disorder superimposed on bipolar disorder, type 2.  AXIS II:  Deferred.  AXIS  III:  None.  AXIS IV:  Moderate (stressors with limited support system and other  psychosocial issues).  AXIS V:  GAF on discharge 55.      Jeanice Lim, M.D.  Electronically Signed     JEM/MEDQ  D:  09/14/2005  T:  09/14/2005  Job:  [id number]

## 2011-06-04 ENCOUNTER — Emergency Department (HOSPITAL_COMMUNITY)
Admission: EM | Admit: 2011-06-04 | Discharge: 2011-06-05 | Disposition: A | Discharge status: Other Acute Inpatient Hospital | Payer: 59 | Attending: Emergency Medicine | Admitting: Emergency Medicine

## 2011-06-04 DIAGNOSIS — R000 Tachycardia, unspecified: Secondary | ICD-10-CM | POA: Insufficient documentation

## 2011-06-04 DIAGNOSIS — F3289 Other specified depressive episodes: Secondary | ICD-10-CM | POA: Insufficient documentation

## 2011-06-04 DIAGNOSIS — F329 Major depressive disorder, single episode, unspecified: Secondary | ICD-10-CM | POA: Insufficient documentation

## 2011-06-04 DIAGNOSIS — F1010 Alcohol abuse, uncomplicated: Secondary | ICD-10-CM | POA: Insufficient documentation

## 2011-06-04 LAB — COMPREHENSIVE METABOLIC PANEL
Albumin: 4.6 g/dL (ref 3.5–5.2)
Alkaline Phosphatase: 66 U/L (ref 39–117)
BUN: 5 mg/dL — ABNORMAL LOW (ref 6–23)
Chloride: 96 mEq/L (ref 96–112)
Creatinine, Ser: 0.6 mg/dL (ref 0.50–1.35)
GFR calc Af Amer: >60 mL/min (ref >60)
GFR calc non Af Amer: >60 mL/min (ref >60)
Glucose, Bld: 102 mg/dL — ABNORMAL HIGH (ref 70–99)
Total Bilirubin: 0.8 mg/dL (ref 0.3–1.2)

## 2011-06-04 LAB — CBC
Hemoglobin: 15.4 g/dL (ref 13.0–17.0)
MCH: 27.2 pg (ref 26.0–34.0)
MCV: 77.6 fL — ABNORMAL LOW (ref 78.0–100.0)
Platelets: 177 10*3/uL (ref 150–400)
RBC: 5.66 MIL/uL (ref 4.22–5.81)
RDW: 14.3 % (ref 11.5–15.5)
WBC: 14.4 10*3/uL — ABNORMAL HIGH (ref 4.0–10.5)

## 2011-06-04 LAB — RAPID URINE DRUG SCREEN, HOSP PERFORMED
Amphetamines: NOT DETECTED
Opiates: NOT DETECTED

## 2011-06-04 LAB — ETHANOL: Alcohol, Ethyl (B): 294 mg/dL — ABNORMAL HIGH (ref 0–11)

## 2011-06-05 ENCOUNTER — Inpatient Hospital Stay (HOSPITAL_COMMUNITY)
Admission: AD | Admit: 2011-06-05 | Discharge: 2011-06-08 | DRG: 897 | Disposition: A | Discharge status: Home / Self Care | Payer: 59 | Attending: Psychiatry | Admitting: Psychiatry

## 2011-06-05 DIAGNOSIS — F1020 Alcohol dependence, uncomplicated: Principal | ICD-10-CM

## 2011-06-05 DIAGNOSIS — F3289 Other specified depressive episodes: Secondary | ICD-10-CM

## 2011-06-05 DIAGNOSIS — F329 Major depressive disorder, single episode, unspecified: Secondary | ICD-10-CM

## 2011-06-07 DIAGNOSIS — F39 Unspecified mood [affective] disorder: Secondary | ICD-10-CM

## 2011-06-07 DIAGNOSIS — F1020 Alcohol dependence, uncomplicated: Secondary | ICD-10-CM

## 2011-06-08 LAB — LITHIUM LEVEL: Lithium Lvl: <0.25 mEq/L — ABNORMAL LOW (ref 0.80–1.40)

## 2011-06-27 NOTE — Discharge Summary (Signed)
NAMEVASILI, FOK            ACCOUNT NO.:  [id number]  MEDICAL RECORD NO.:  [id number]  LOCATION:  4782                          FACILITY:  [facility]  PHYSICIAN:  Orson Aloe, MD       DATE OF BIRTH:  Sep 28, 1971  DATE OF ADMISSION:  06/05/2011 DATE OF DISCHARGE:  06/08/2011                              DISCHARGE SUMMARY   IDENTIFYING INFORMATION:  This is a 40 year old Caucasian male.  This is a voluntary admission.  HISTORY OF PRESENT ILLNESS:  Xan presented with depression and alcohol abuse, had recently lost his marriage and endorsed a history of alcoholism.  He had been through numerous treatments and had been in jail three times in the past month.  He was asking for a safe detox and getting help with long-term treatment.  He reported drinking in a binge pattern and consuming at least 18 beers on the day he binges, sometimes up to 36 beers at a time.  He had recently been on an 8-9 day binge prior to admission.  He had DWI charge with a court date pending.  He denied any suicidal thoughts.  MEDICAL EVALUATION:  He was medically evaluated in our emergency room. He is a normally developed male who was noted to have a mildly elevated WBC of 14.4, normal hemoglobin at 15.4 and normal platelets at 177,000. Chemistry normal.  Hepatic function mildly elevated with AST 63, ALT 35, alkaline phosphatase 66 and total bilirubin 0.8.  Urine drug screen was negative for all substances and alcohol level 294 mg/dL.  COURSE OF HOSPITALIZATION:  He was admitted to our dual diagnosis unit and started on Vistaril 50 mg q.6 hours p.r.n. for insomnia and started on a Librium detox protocol with a goal of a safe detox in 4 days.  He was given a provisional diagnosis of bipolar disorder NOS and started on lithium 300 mg p.o. b.i.d.  Sleep was improved when we added Seroquel 25 mg h.s. p.r.n. for insomnia and the Vistaril was ultimately discontinued.  His detox was uneventful.  His  participation in group therapy was satisfactory and interaction with peers and staff appropriate.  We checked a lithium level on him on September 2 and it was subtherapeutic, at less than 0.25 mEq per liter.  Throughout his stay, he clearly and convincingly denied any suicidal thoughts and he was ready for discharge by September 2.  DISCHARGE PLAN:  Follow up with Dr. Geoffery Lyons for medication management and he requested to contact Dr. Dub Mikes to make his own appointment and he was given information on attending AA and was to start at [facility] meetings on June 08, 2011 and complete 90 meetings in 90 days.  DISCHARGE DIAGNOSES:  AXIS I:  Alcohol dependence. AXIS II:  Deferred. AXIS III:  Deferred. AXIS IV:  Moderate chronic social issues related to substance abuse. AXIS V:  Current 55, past year not known.  DISCHARGE MEDICATIONS: 1. Hydroxyzine 25 mg q.6 hours as needed for anxiety. 2. Lithium carbonate 300 mg q.12 hours for bipolar disorder. 3. Multivitamin 1 tablet daily. 4. Nicotine patch as needed for tobacco withdrawal.5. Thiamine 100 mg by mouth daily for vitamin B1 supplementation.     Margaret A.  Lorin Picket, N.P.   ______________________________ Orson Aloe, MD    MAS/MEDQ  D:  06/26/2011  T:  06/26/2011  Job:  [id number]  Electronically Signed by Orson Aloe  on 06/27/2011 08:16:04 AM

## 2011-06-27 NOTE — Assessment & Plan Note (Signed)
  [name], [name]            ACCOUNT NO.:  [id number]  MEDICAL RECORD NO.:  [id number]  LOCATION:  4098                          FACILITY:  [facility]  PHYSICIAN:  Orson Aloe, MD       DATE OF BIRTH:  1971/07/17  DATE OF ADMISSION:  06/05/2011 DATE OF DISCHARGE:  06/08/2011                      PSYCHIATRIC ADMISSION ASSESSMENT   IDENTIFYING INFORMATION:  A 40 year old Caucasian male.  This is a voluntary admission.  HISTORY OF PRESENT ILLNESS:  [name] presents requesting help getting off alcohol.  Had been on an 8- to 9-day binge and was found in the emergency room to have a significantly elevated alcohol level.  He denies any suicidal or homicidal thoughts but feels that alcohol is wrecking his life.  Has a pending court date for DWI, and wife has left him because of his alcohol use.  PAST PSYCHIATRIC HISTORY:  No current outpatient treatment.  MEDICAL EVALUATION:  He was medically evaluated in our emergency room, where he was noted to have an elevated alcohol level of 294 mg/dL and a negative urine drug screen.  Liver enzymes mildly elevated with AST 63, ALT 35, alkaline phosphatase 66, and total bilirubin 0.8.  ADMITTING MENTAL STATUS EXAM:  Fully alert male, cooperative, in full contact with reality.  Good eye contact.  Clear, goal-oriented thoughts. No evidence of delirium.  No evidence of acute withdrawal.  No delusional statements made.  No evidence of psychosis.  His cognition is completely intact.  Memory intact.  Judgment and insight adequate. Denying any suicidal thoughts.  ADMITTING DIAGNOSES:  Axis I:  Alcohol dependence.  History of bipolar disorder. Axis II:  No diagnosis. Axis III:  No diagnosis. Axis IV:  Significant social issues related to alcohol abuse. Axis V:  Current 40, past year not known.  The plan is to voluntarily admit him with a goal of a safe detox and to stabilize his mood.  We will start him on lithium 300 mg b.i.d. and Librium protocol  with a goal of a safe detox.    Margaret A. Lorin Picket, N.P.   ______________________________ Orson Aloe, MD   MAS/MEDQ  D:  06/26/2011  T:  06/26/2011  Job:  [id number]  Electronically Signed by Orson Aloe  on 06/27/2011 08:17:11 AM

## 2011-10-26 ENCOUNTER — Inpatient Hospital Stay (HOSPITAL_COMMUNITY)
Admission: EM | Admit: 2011-10-26 | Discharge: 2011-10-29 | DRG: 897 | Disposition: A | Discharge status: Home / Self Care | Payer: Self-pay | Attending: Internal Medicine | Admitting: Internal Medicine

## 2011-10-26 ENCOUNTER — Other Ambulatory Visit: Payer: Self-pay

## 2011-10-26 ENCOUNTER — Encounter (HOSPITAL_COMMUNITY): Payer: Self-pay | Admitting: Emergency Medicine

## 2011-10-26 DIAGNOSIS — E860 Dehydration: Secondary | ICD-10-CM | POA: Diagnosis present

## 2011-10-26 DIAGNOSIS — F10239 Alcohol dependence with withdrawal, unspecified: Principal | ICD-10-CM

## 2011-10-26 DIAGNOSIS — F1010 Alcohol abuse, uncomplicated: Secondary | ICD-10-CM | POA: Diagnosis present

## 2011-10-26 DIAGNOSIS — F10939 Alcohol use, unspecified with withdrawal, unspecified: Principal | ICD-10-CM | POA: Diagnosis present

## 2011-10-26 DIAGNOSIS — F32A Depression, unspecified: Secondary | ICD-10-CM | POA: Diagnosis present

## 2011-10-26 DIAGNOSIS — F17200 Nicotine dependence, unspecified, uncomplicated: Secondary | ICD-10-CM | POA: Diagnosis present

## 2011-10-26 DIAGNOSIS — F1020 Alcohol dependence, uncomplicated: Secondary | ICD-10-CM | POA: Diagnosis present

## 2011-10-26 DIAGNOSIS — F329 Major depressive disorder, single episode, unspecified: Secondary | ICD-10-CM | POA: Diagnosis present

## 2011-10-26 DIAGNOSIS — F3289 Other specified depressive episodes: Secondary | ICD-10-CM | POA: Diagnosis present

## 2011-10-26 DIAGNOSIS — R45851 Suicidal ideations: Secondary | ICD-10-CM | POA: Diagnosis present

## 2011-10-26 DIAGNOSIS — F10231 Alcohol dependence with withdrawal delirium: Secondary | ICD-10-CM

## 2011-10-26 HISTORY — DX: Nicotine dependence, unspecified, uncomplicated: F17.200

## 2011-10-26 HISTORY — DX: Anxiety disorder, unspecified: F41.9

## 2011-10-26 HISTORY — DX: Major depressive disorder, single episode, unspecified: F32.9

## 2011-10-26 HISTORY — DX: Alcohol abuse, uncomplicated: F10.10

## 2011-10-26 HISTORY — DX: Depression, unspecified: F32.A

## 2011-10-26 HISTORY — DX: Bipolar II disorder: F31.81

## 2011-10-26 LAB — RAPID URINE DRUG SCREEN, HOSP PERFORMED
Amphetamines: NOT DETECTED
Benzodiazepines: NOT DETECTED
Cocaine: NOT DETECTED
Opiates: NOT DETECTED
Tetrahydrocannabinol: NOT DETECTED

## 2011-10-26 LAB — COMPREHENSIVE METABOLIC PANEL
ALT: 34 U/L (ref 0–53)
AST: 37 U/L (ref 0–37)
CO2: 26 mEq/L (ref 19–32)
Calcium: 9.7 mg/dL (ref 8.4–10.5)
Creatinine, Ser: 0.73 mg/dL (ref 0.50–1.35)
GFR calc Af Amer: >90 mL/min (ref >90)
GFR calc non Af Amer: >90 mL/min (ref >90)
Glucose, Bld: 155 mg/dL — ABNORMAL HIGH (ref 70–99)
Sodium: 136 mEq/L (ref 135–145)
Total Protein: 8.8 g/dL — ABNORMAL HIGH (ref 6.0–8.3)

## 2011-10-26 LAB — CBC
HCT: 49.1 % (ref 39.0–52.0)
MCH: 27.3 pg (ref 26.0–34.0)
MCHC: 35.6 g/dL (ref 30.0–36.0)
MCV: 76.5 fL — ABNORMAL LOW (ref 78.0–100.0)
Platelets: 230 10*3/uL (ref 150–400)
RDW: 14 % (ref 11.5–15.5)

## 2011-10-26 LAB — DIFFERENTIAL
Basophils Absolute: 0 10*3/uL (ref 0.0–0.1)
Eosinophils Absolute: 0 10*3/uL (ref 0.0–0.7)
Eosinophils Relative: 0 % (ref 0–5)
Lymphocytes Relative: 18 % (ref 12–46)
Monocytes Absolute: 0.7 10*3/uL (ref 0.1–1.0)

## 2011-10-26 MED ORDER — LORAZEPAM 1 MG PO TABS: 1.0000 mg | ORAL_TABLET | Freq: Four times a day (QID) | ORAL | Status: DC | PRN

## 2011-10-26 MED ORDER — ACETAMINOPHEN 325 MG PO TABS
650.0000 mg | ORAL_TABLET | Freq: Four times a day (QID) | ORAL | Status: DC | PRN
  Administered 2011-10-26 – 2011-10-28 (×5): 650 mg via ORAL
  Filled 2011-10-26 (×5): qty 2

## 2011-10-26 MED ORDER — ONDANSETRON HCL 4 MG/2ML IJ SOLN
4.0000 mg | Freq: Once | INTRAMUSCULAR | Status: AC
  Administered 2011-10-26: 4 mg via INTRAVENOUS
  Filled 2011-10-26: qty 2

## 2011-10-26 MED ORDER — THIAMINE HCL 100 MG/ML IJ SOLN
Freq: Once | INTRAVENOUS | Status: AC
  Administered 2011-10-26: 21:00:00 via INTRAVENOUS
  Filled 2011-10-26: qty 1000

## 2011-10-26 MED ORDER — ENOXAPARIN SODIUM 30 MG/0.3ML ~~LOC~~ SOLN
30.0000 mg | SUBCUTANEOUS | Status: DC
  Administered 2011-10-26 – 2011-10-28 (×3): 30 mg via SUBCUTANEOUS
  Filled 2011-10-26 (×4): qty 0.3

## 2011-10-26 MED ORDER — SODIUM CHLORIDE 0.9 % IV BOLUS (SEPSIS)
1000.0000 mL | Freq: Once | INTRAVENOUS | Status: AC
  Administered 2011-10-26: 1000 mL via INTRAVENOUS

## 2011-10-26 MED ORDER — LORAZEPAM 2 MG/ML IJ SOLN
2.0000 mg | Freq: Once | INTRAMUSCULAR | Status: AC
  Administered 2011-10-26: 2 mg via INTRAVENOUS
  Filled 2011-10-26: qty 1

## 2011-10-26 MED ORDER — LORAZEPAM 2 MG/ML IJ SOLN
0.0000 mg | Freq: Two times a day (BID) | INTRAMUSCULAR | Status: DC
  Administered 2011-10-28: 2 mg via INTRAVENOUS

## 2011-10-26 MED ORDER — LORAZEPAM 2 MG/ML IJ SOLN
0.0000 mg | Freq: Four times a day (QID) | INTRAMUSCULAR | Status: AC
  Administered 2011-10-26 – 2011-10-28 (×7): 2 mg via INTRAVENOUS
  Filled 2011-10-26 (×5): qty 1

## 2011-10-26 MED ORDER — ACETAMINOPHEN 650 MG RE SUPP: 650.0000 mg | Freq: Four times a day (QID) | RECTAL | Status: DC | PRN

## 2011-10-26 MED ORDER — ONDANSETRON HCL 4 MG PO TABS: 4.0000 mg | ORAL_TABLET | Freq: Four times a day (QID) | ORAL | Status: DC | PRN

## 2011-10-26 MED ORDER — LORAZEPAM 2 MG/ML IJ SOLN
1.0000 mg | Freq: Four times a day (QID) | INTRAMUSCULAR | Status: DC | PRN
  Administered 2011-10-26 – 2011-10-28 (×3): 1 mg via INTRAVENOUS
  Filled 2011-10-26 (×6): qty 1

## 2011-10-26 MED ORDER — ONDANSETRON HCL 4 MG/2ML IJ SOLN: 4.0000 mg | Freq: Four times a day (QID) | INTRAMUSCULAR | Status: DC | PRN

## 2011-10-26 MED ORDER — DIAZEPAM 5 MG PO TABS
5.0000 mg | ORAL_TABLET | Freq: Two times a day (BID) | ORAL | Status: DC
  Administered 2011-10-26: 5 mg via ORAL
  Filled 2011-10-26: qty 1

## 2011-10-26 MED ORDER — SODIUM CHLORIDE 0.9 % IV SOLN
Freq: Once | INTRAVENOUS | Status: AC
  Administered 2011-10-26: 14:00:00 via INTRAVENOUS

## 2011-10-26 NOTE — ED Notes (Signed)
Consult to bedside.

## 2011-10-26 NOTE — ED Provider Notes (Signed)
History     CSN: [id number]  Arrival date & time 10/26/11  1304   First MD Initiated Contact with Patient 10/26/11 1310      Chief Complaint  Patient presents with  . Delirium Tremens (DTS)    (Consider location/radiation/quality/duration/timing/severity/associated sxs/prior treatment) The history is provided by the patient and a friend.   Patient presents with tremors and anxiety following cessation of drinking alcohol. Patient has been drinking 36 beers a day for the past 3 days but has been unable to drink for the past 8-9 hours due to vomiting. He denies any black stools, abdominal pain, blood in his vomit. His only other illicit drug use is marijuana. He denies any suicidal or homicidal ideations. Patient had been sober for 3 months prior to his current is taking. Does admit to depression. No medications used prior to arrival Past Medical History  Diagnosis Date  . Alcohol abuse     History reviewed. No pertinent past surgical history.  No family history on file.  History  Substance Use Topics  . Smoking status: Not on file  . Smokeless tobacco: Not on file  . Alcohol Use: Yes     heavy       Review of Systems  All other systems reviewed and are negative.    Allergies  Review of patient's allergies indicates not on file.  Home Medications  No current outpatient prescriptions on file.  There were no vitals taken for this visit.  Physical Exam  Nursing note and vitals reviewed. Constitutional: He is oriented to person, place, and time. He appears well-developed and well-nourished.  Non-toxic appearance. No distress.  HENT:  Head: Normocephalic and atraumatic.  Eyes: Conjunctivae, EOM and lids are normal. Pupils are equal, round, and reactive to light.  Neck: Normal range of motion. Neck supple. No tracheal deviation present. No mass present.  Cardiovascular: Regular rhythm and normal heart sounds.  Tachycardia present.  Exam reveals no gallop.   No murmur  heard. Pulmonary/Chest: Effort normal and breath sounds normal. No stridor. No respiratory distress. He has no decreased breath sounds. He has no wheezes. He has no rhonchi. He has no rales.  Abdominal: Soft. Normal appearance and bowel sounds are normal. He exhibits no distension. There is no tenderness. There is no rebound and no CVA tenderness.  Musculoskeletal: Normal range of motion. He exhibits no edema and no tenderness.  Neurological: He is alert and oriented to person, place, and time. He has normal strength. No cranial nerve deficit or sensory deficit. GCS eye subscore is 4. GCS verbal subscore is 5. GCS motor subscore is 6.  Skin: Skin is warm and dry. No abrasion and no rash noted.  Psychiatric: His speech is normal. He is agitated. He expresses no homicidal and no suicidal ideation.    ED Course  Procedures (including critical care time)   Labs Reviewed  URINE RAPID DRUG SCREEN (HOSP PERFORMED)  ETHANOL  CBC  DIFFERENTIAL  COMPREHENSIVE METABOLIC PANEL   No results found.   No diagnosis found.    MDM  Patient given Ativan x2 IV push. Patient also given IV fluids. Patient is in seizure precautions. He will be admitted by the hospitalist  CRITICAL CARE Performed by: Toy Baker   Total critical care time: 65   Critical care time was exclusive of separately billable procedures and treating other patients.  Critical care was necessary to treat or prevent imminent or life-threatening deterioration.  Critical care was time spent personally by me on  the following activities: development of treatment plan with patient and/or surrogate as well as nursing, discussions with consultants, evaluation of patient's response to treatment, examination of patient, obtaining history from patient or surrogate, ordering and performing treatments and interventions, ordering and review of laboratory studies, ordering and review of radiographic studies, pulse oximetry and  re-evaluation of patient's condition.\        Toy Baker, MD 10/26/11 [phone]

## 2011-10-26 NOTE — ED Notes (Signed)
Pt drinks heavy and was drinking then stopped 3 days ago, diaphoretic, shaking, tremors,

## 2011-10-26 NOTE — H&P (Signed)
PCP:  none  Chief Complaint:  "I'm withdrawing from alcohol"  HPI: [name] is a 41 year old white male with history of alcohol abuse and dependence, was brought to the ER by his friends today because he was seen shaking uncontrollably, restless, unsteady, and perspiring. [name] reports that he drinks heavily on a daily basis, with intermittent episodes of sobriety. He usually drinks 36 beers a day he has been doing this for almost a week now, was drinking a little less heavily prior to that, he had one beer today and had a little less than his usual yesterday. Currently reports nausea, uncontrollable shakes, sweating and restlessness. He denies any fevers or chills, denies recent trauma or injury, denies using any other prescription or illicit drug.  Allergies:  No Known Allergies    Past Medical History  Diagnosis Date  . Alcohol abuse   . Depression     Past Surgical History  Procedure Date  . 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 No pertinent family history.  Review of Systems: Positives  bolded Constitutional: Denies fever, chills, diaphoresis, appetite change and fatigue.  HEENT: Denies photophobia, eye pain, redness, hearing loss, ear pain, congestion, sore throat, rhinorrhea, sneezing, mouth sores, trouble swallowing, neck pain, neck stiffness and tinnitus.   Respiratory: Denies SOB, DOE, cough, chest tightness,  and wheezing.   Cardiovascular: Denies chest pain, palpitations and leg swelling.  Gastrointestinal: Denies nausea, vomiting, abdominal pain, diarrhea, constipation, blood in stool and abdominal distention.  Genitourinary: Denies dysuria, urgency, frequency, hematuria, flank  pain and difficulty urinating.  Musculoskeletal: Denies myalgias, back pain, joint swelling, arthralgias and gait problem.  Skin: Denies pallor, rash and wound.  Neurological: Denies dizziness, seizures, syncope, weakness, light-headedness, numbness and headaches.  Hematological: Denies adenopathy. Easy bruising, personal or family bleeding history  Psychiatric/Behavioral: Denies suicidal ideation, mood changes, confusion, nervousness, sleep disturbance and agitation   Physical Exam: Blood pressure 134/76, pulse 119, temperature 98.6 F (37 C), temperature source Oral, resp. rate 21, SpO2 96.00%. General exam: Thinly built white male sitting in the stretcher, the entire face and upper chest is flushed HEENT: Pupils are former 4 mm reactive to light, oral mucosa is dry Neck no JVD or lymphadenopathy CVS S1-S2 tachycardic regular rate and rhythm no murmurs rubs or gallops Lungs clear to auscultation bilaterally Abdomen soft mild epigastric tenderness no rigidity or rebound positive bowel sounds Extremities no edema clubbing or cyanosis Neuro fine tremors noted in upper extremity, otherwise moves all extremities no localizing signs no asterixis  Labs on Admission:  Results for orders placed during the hospital encounter of 10/26/11 (from the past 48 hour(s))  ETHANOL     Status: Abnormal   Collection Time   10/26/11  1:20 PM      Component Value Range Comment   Alcohol, Ethyl (B) 38 (*) 0 - 11 (mg/dL)   CBC     Status: Abnormal   Collection Time   10/26/11  1:20 PM      Component Value Range Comment   WBC 10.8 (*) 4.0 - 10.5 (K/uL)    RBC 6.42 (*) 4.22 - 5.81 (MIL/uL)    Hemoglobin 17.5 (*) 13.0 - 17.0 (g/dL)  HCT 49.1  39.0 - 52.0 (%)    MCV 76.5 (*) 78.0 - 100.0 (fL)    MCH 27.3  26.0 - 34.0 (pg)    MCHC 35.6  30.0 - 36.0 (g/dL)    RDW 78.2  95.6 - 21.3 (%)    Platelets 230  150 - 400 (K/uL)   DIFFERENTIAL     Status: Abnormal   Collection Time   10/26/11  1:20 PM       Component Value Range Comment   Neutrophils Relative 76  43 - 77 (%)    Neutro Abs 8.2 (*) 1.7 - 7.7 (K/uL)    Lymphocytes Relative 18  12 - 46 (%)    Lymphs Abs 1.9  0.7 - 4.0 (K/uL)    Monocytes Relative 6  3 - 12 (%)    Monocytes Absolute 0.7  0.1 - 1.0 (K/uL)    Eosinophils Relative 0  0 - 5 (%)    Eosinophils Absolute 0.0  0.0 - 0.7 (K/uL)    Basophils Relative 0  0 - 1 (%)    Basophils Absolute 0.0  0.0 - 0.1 (K/uL)   COMPREHENSIVE METABOLIC PANEL     Status: Abnormal   Collection Time   10/26/11  1:20 PM      Component Value Range Comment   Sodium 136  135 - 145 (mEq/L)    Potassium 3.5  3.5 - 5.1 (mEq/L)    Chloride 94 (*) 96 - 112 (mEq/L)    CO2 26  19 - 32 (mEq/L)    Glucose, Bld 155 (*) 70 - 99 (mg/dL)    BUN 11  6 - 23 (mg/dL)    Creatinine, Ser 0.86  0.50 - 1.35 (mg/dL)    Calcium 9.7  8.4 - 10.5 (mg/dL)    Total Protein 8.8 (*) 6.0 - 8.3 (g/dL)    Albumin 4.7  3.5 - 5.2 (g/dL)    AST 37  0 - 37 (U/L)    ALT 34  0 - 53 (U/L)    Alkaline Phosphatase 78  39 - 117 (U/L)    Total Bilirubin 0.9  0.3 - 1.2 (mg/dL)    GFR calc non Af Amer >90  >90 (mL/min)    GFR calc Af Amer >90  >90 (mL/min)   LIPASE, BLOOD     Status: Normal   Collection Time   10/26/11  1:39 PM      Component Value Range Comment   Lipase 34  11 - 59 (U/L)   URINE RAPID DRUG SCREEN (HOSP PERFORMED)     Status: Normal   Collection Time   10/26/11  2:00 PM      Component Value Range Comment   Opiates NONE DETECTED  NONE DETECTED     Cocaine NONE DETECTED  NONE DETECTED     Benzodiazepines NONE DETECTED  NONE DETECTED     Amphetamines NONE DETECTED  NONE DETECTED     Tetrahydrocannabinol NONE DETECTED  NONE DETECTED     Barbiturates NONE DETECTED  NONE DETECTED      Radiological Exams on Admission: No results found.  Assessment/Plan 1. Alcohol withdrawal syndrome 2. alcohol abuse  3. Depression 4. dehydration with hemoconcentration A little early for full-blown alcohol withdrawal since he  was drinking as of yesterday but still less than usual, suspect it would get worse through the next 2 days of his hospital course. Alcohol level is 38, which I suspect is still less for somebody who drinks 36 beers a  day. Will admit him to step down unit Check an EKG Start CIWA protocol with Ativan Will also add some scheduled Valium  Start him on IV fluids with thiamine multivitamin and folic acid and IV PPI Check magnesium level Will request  a psych consult for tomorrow. Code status: FULL  Time Spent on Admission:  Makaylee Spielberg [facility] Pager: [phone] 10/26/2011, 3:14 PM

## 2011-10-26 NOTE — ED Notes (Signed)
Pt belongings taken home with friends including cell phone, wallet and clothes

## 2011-10-27 ENCOUNTER — Encounter (HOSPITAL_COMMUNITY): Payer: Self-pay

## 2011-10-27 DIAGNOSIS — F10231 Alcohol dependence with withdrawal delirium: Secondary | ICD-10-CM

## 2011-10-27 MED ORDER — SODIUM CHLORIDE 0.9 % IV SOLN
INTRAVENOUS | Status: DC
  Administered 2011-10-27 – 2011-10-28 (×3): via INTRAVENOUS

## 2011-10-27 NOTE — Consult Note (Signed)
Reason for Consult: Alcohol withdrawal, detoxification. Referring Physician: Dr. Jomarie Longs.  [name] is an 41 y.o. male.  HPI: Patient had been drinking and presented with withdrawal symptoms from alcohol.  Past Medical History  Diagnosis Date  . Alcohol abuse   . Anxiety   . Depression   . Bipolar 2 disorder   . Active smoker     Past Surgical History  Procedure Date  . Lymph node dissection     History reviewed. No pertinent family history.  Social History:  reports that he has been smoking.  He does not have any smokeless tobacco history on file. He reports that he drinks about 21.6 ounces of alcohol per week. His drug history not on file.  Allergies: No Known Allergies  Medications: I have reviewed the patient's current medications.  Results for orders placed during the hospital encounter of 10/26/11 (from the past 48 hour(s))  ETHANOL     Status: Abnormal   Collection Time   10/26/11  1:20 PM      Component Value Range Comment   Alcohol, Ethyl (B) 38 (*) 0 - 11 (mg/dL)   CBC     Status: Abnormal   Collection Time   10/26/11  1:20 PM      Component Value Range Comment   WBC 10.8 (*) 4.0 - 10.5 (K/uL)    RBC 6.42 (*) 4.22 - 5.81 (MIL/uL)    Hemoglobin 17.5 (*) 13.0 - 17.0 (g/dL)    HCT 21.3  08.6 - 57.8 (%)    MCV 76.5 (*) 78.0 - 100.0 (fL)    MCH 27.3  26.0 - 34.0 (pg)    MCHC 35.6  30.0 - 36.0 (g/dL)    RDW 46.9  62.9 - 52.8 (%)    Platelets 230  150 - 400 (K/uL)   DIFFERENTIAL     Status: Abnormal   Collection Time   10/26/11  1:20 PM      Component Value Range Comment   Neutrophils Relative 76  43 - 77 (%)    Neutro Abs 8.2 (*) 1.7 - 7.7 (K/uL)    Lymphocytes Relative 18  12 - 46 (%)    Lymphs Abs 1.9  0.7 - 4.0 (K/uL)    Monocytes Relative 6  3 - 12 (%)    Monocytes Absolute 0.7  0.1 - 1.0 (K/uL)    Eosinophils Relative 0  0 - 5 (%)    Eosinophils Absolute 0.0  0.0 - 0.7 (K/uL)    Basophils Relative 0  0 - 1 (%)    Basophils Absolute 0.0  0.0 -  0.1 (K/uL)   COMPREHENSIVE METABOLIC PANEL     Status: Abnormal   Collection Time   10/26/11  1:20 PM      Component Value Range Comment   Sodium 136  135 - 145 (mEq/L)    Potassium 3.5  3.5 - 5.1 (mEq/L)    Chloride 94 (*) 96 - 112 (mEq/L)    CO2 26  19 - 32 (mEq/L)    Glucose, Bld 155 (*) 70 - 99 (mg/dL)    BUN 11  6 - 23 (mg/dL)    Creatinine, Ser 4.13  0.50 - 1.35 (mg/dL)    Calcium 9.7  8.4 - 10.5 (mg/dL)    Total Protein 8.8 (*) 6.0 - 8.3 (g/dL)    Albumin 4.7  3.5 - 5.2 (g/dL)    AST 37  0 - 37 (U/L)    ALT 34  0 - 53 (  U/L)    Alkaline Phosphatase 78  39 - 117 (U/L)    Total Bilirubin 0.9  0.3 - 1.2 (mg/dL)    GFR calc non Af Amer >90  >90 (mL/min)    GFR calc Af Amer >90  >90 (mL/min)   LIPASE, BLOOD     Status: Normal   Collection Time   10/26/11  1:39 PM      Component Value Range Comment   Lipase 34  11 - 59 (U/L)   URINE RAPID DRUG SCREEN (HOSP PERFORMED)     Status: Normal   Collection Time   10/26/11  2:00 PM      Component Value Range Comment   Opiates NONE DETECTED  NONE DETECTED     Cocaine NONE DETECTED  NONE DETECTED     Benzodiazepines NONE DETECTED  NONE DETECTED     Amphetamines NONE DETECTED  NONE DETECTED     Tetrahydrocannabinol NONE DETECTED  NONE DETECTED     Barbiturates NONE DETECTED  NONE DETECTED    MAGNESIUM     Status: Normal   Collection Time   10/26/11  8:48 PM      Component Value Range Comment   Magnesium 2.0  1.5 - 2.5 (mg/dL)     No results found.  Review of Systems  Unable to perform ROS: medical condition   Blood pressure 120/68, pulse 85, temperature 97.9 F (36.6 C), temperature source Oral, resp. rate 18, height 5\' 10"  (1.778 m), weight 74.844 kg (165 lb), SpO2 97.00%. Physical Exam  Assessment/Plan: Patient was admitted for alcohol withdrawal. He was tachycardic and Ativan detox protocol was initiated. He is awake and alert with good eye contact and normal speech. There are periods of being guarded about information. He  states that he was in the [employer] with one stated contact mission in [place]. He expresses disillusionment but not active flashbacks or nightmares. He makes a specific statement that he owns no weapons. He denies past history of mood swings but does state he was arrested for assault on one occasion. He reports that he is a binge drinker. The last detox was in August 2012. He did not have any pain to drink until January 19. He may spend 3-4 days drinking as many as 36 mm light. He admits to being confused and has some lapse of memory. He denies suicidal ideation. He is very despondent however because he has separated from his wife of many years and his 7 year old son may presented from seeing him.  he said that he has been taking SGA Seroquel to stop racing thoughts and Lamictal.  RECOMMENDATION:  1 this patient will be followed as the Ativan detox proceeds.  2 disposition will be determined following this detox and discussion with the Psych CSW   Jimmy Plessinger 10/27/2011, 9:03 PM

## 2011-10-27 NOTE — Progress Notes (Signed)
Subjective: Tachycardic overnight consistent with alcohol withdrawal  Objective: Vital signs in last 24 hours: Filed Vitals:   10/26/11 2153 10/26/11 2255 10/27/11 0358 10/27/11 0600  BP: 117/70 117/70 113/69 127/70  Pulse: 92 95 75 75  Temp: 98.3 F (36.8 C)   97.8 F (36.6 C)  TempSrc: Oral     Resp: 20   18  Height:      Weight:      SpO2: 94%   95%    Intake/Output Summary (Last 24 hours) at 10/27/11 1109 Last data filed at 10/26/11 1607  Gross per 24 hour  Intake      0 ml  Output   1100 ml  Net  -1100 ml    Weight change:   General exam: Thinly built white male sitting in the stretcher, the entire face and upper chest is flushed  HEENT: Pupils are former 4 mm reactive to light, oral mucosa is dry  Neck no JVD or lymphadenopathy  CVS S1-S2 tachycardic regular rate and rhythm no murmurs rubs or gallops  Lungs clear to auscultation bilaterally  Abdomen soft mild epigastric tenderness no rigidity or rebound positive bowel sounds  Extremities no edema clubbing or cyanosis  Neuro fine tremors noted in upper extremity, otherwise moves all extremities no localizing signs no asterixis   Lab Results: Results for orders placed during the hospital encounter of 10/26/11 (from the past 24 hour(s))  ETHANOL     Status: Abnormal   Collection Time   10/26/11  1:20 PM      Component Value Range   Alcohol, Ethyl (B) 38 (*) 0 - 11 (mg/dL)  CBC     Status: Abnormal   Collection Time   10/26/11  1:20 PM      Component Value Range   WBC 10.8 (*) 4.0 - 10.5 (K/uL)   RBC 6.42 (*) 4.22 - 5.81 (MIL/uL)   Hemoglobin 17.5 (*) 13.0 - 17.0 (g/dL)   HCT 16.1  09.6 - 04.5 (%)   MCV 76.5 (*) 78.0 - 100.0 (fL)   MCH 27.3  26.0 - 34.0 (pg)   MCHC 35.6  30.0 - 36.0 (g/dL)   RDW 40.9  81.1 - 91.4 (%)   Platelets 230  150 - 400 (K/uL)  DIFFERENTIAL     Status: Abnormal   Collection Time   10/26/11  1:20 PM      Component Value Range   Neutrophils Relative 76  43 - 77 (%)   Neutro Abs 8.2  (*) 1.7 - 7.7 (K/uL)   Lymphocytes Relative 18  12 - 46 (%)   Lymphs Abs 1.9  0.7 - 4.0 (K/uL)   Monocytes Relative 6  3 - 12 (%)   Monocytes Absolute 0.7  0.1 - 1.0 (K/uL)   Eosinophils Relative 0  0 - 5 (%)   Eosinophils Absolute 0.0  0.0 - 0.7 (K/uL)   Basophils Relative 0  0 - 1 (%)   Basophils Absolute 0.0  0.0 - 0.1 (K/uL)  COMPREHENSIVE METABOLIC PANEL     Status: Abnormal   Collection Time   10/26/11  1:20 PM      Component Value Range   Sodium 136  135 - 145 (mEq/L)   Potassium 3.5  3.5 - 5.1 (mEq/L)   Chloride 94 (*) 96 - 112 (mEq/L)   CO2 26  19 - 32 (mEq/L)   Glucose, Bld 155 (*) 70 - 99 (mg/dL)   BUN 11  6 - 23 (mg/dL)   Creatinine,  Ser 0.73  0.50 - 1.35 (mg/dL)   Calcium 9.7  8.4 - 16.1 (mg/dL)   Total Protein 8.8 (*) 6.0 - 8.3 (g/dL)   Albumin 4.7  3.5 - 5.2 (g/dL)   AST 37  0 - 37 (U/L)   ALT 34  0 - 53 (U/L)   Alkaline Phosphatase 78  39 - 117 (U/L)   Total Bilirubin 0.9  0.3 - 1.2 (mg/dL)   GFR calc non Af Amer >90  >90 (mL/min)   GFR calc Af Amer >90  >90 (mL/min)  LIPASE, BLOOD     Status: Normal   Collection Time   10/26/11  1:39 PM      Component Value Range   Lipase 34  11 - 59 (U/L)  URINE RAPID DRUG SCREEN (HOSP PERFORMED)     Status: Normal   Collection Time   10/26/11  2:00 PM      Component Value Range   Opiates NONE DETECTED  NONE DETECTED    Cocaine NONE DETECTED  NONE DETECTED    Benzodiazepines NONE DETECTED  NONE DETECTED    Amphetamines NONE DETECTED  NONE DETECTED    Tetrahydrocannabinol NONE DETECTED  NONE DETECTED    Barbiturates NONE DETECTED  NONE DETECTED   MAGNESIUM     Status: Normal   Collection Time   10/26/11  8:48 PM      Component Value Range   Magnesium 2.0  1.5 - 2.5 (mg/dL)     Micro: No results found for this or any previous visit (from the past 240 hour(s)).  Studies/Results: No results found.  Medications:  Scheduled Meds:   . sodium chloride   Intravenous Once  . enoxaparin  30 mg Subcutaneous Q24H  .  LORazepam  0-4 mg Intravenous Q6H   Followed by  . LORazepam  0-4 mg Intravenous Q12H  . LORazepam  2 mg Intravenous Once  . LORazepam  2 mg Intravenous Once  . ondansetron (ZOFRAN) IV  4 mg Intravenous Once  . general admission iv infusion   Intravenous Once  . sodium chloride  1,000 mL Intravenous Once  . DISCONTD: diazepam  5 mg Oral BID   Continuous Infusions:  PRN Meds:.acetaminophen, acetaminophen, LORazepam, LORazepam, ondansetron (ZOFRAN) IV, ondansetron   Assessment:   1. Alcohol withdrawal syndrome continue ciwa  protocol, continued telemetry 2. alcohol abuse social work consultation, continue thiamine and folic acid, magnesium level is okay  3. Depression  4. dehydration with hemoconcentration, continue IV fluids     LOS: 1 day   [facility] 10/27/2011, 11:09 AM

## 2011-10-27 NOTE — Progress Notes (Signed)
Per State Regulation 482.30 This chart was reviewed for medical necessity with respect to the patient's Admission/Duration of stay.    Stclair Szymborski K                 Nurse Care Manager              Next Review Date:10-30-11   

## 2011-10-28 LAB — COMPREHENSIVE METABOLIC PANEL
ALT: 30 U/L (ref 0–53)
Alkaline Phosphatase: 58 U/L (ref 39–117)
CO2: 26 mEq/L (ref 19–32)
GFR calc Af Amer: >90 mL/min (ref >90)
GFR calc non Af Amer: >90 mL/min (ref >90)
Glucose, Bld: 96 mg/dL (ref 70–99)
Potassium: 3.6 mEq/L (ref 3.5–5.1)
Sodium: 139 mEq/L (ref 135–145)
Total Bilirubin: 0.8 mg/dL (ref 0.3–1.2)

## 2011-10-28 MED ORDER — VALPROIC ACID 250 MG PO CAPS
250.0000 mg | ORAL_CAPSULE | Freq: Two times a day (BID) | ORAL | Status: DC
  Administered 2011-10-28 – 2011-10-29 (×2): 250 mg via ORAL
  Filled 2011-10-28 (×3): qty 1

## 2011-10-28 MED ORDER — ZOLPIDEM TARTRATE 5 MG PO TABS
5.0000 mg | ORAL_TABLET | Freq: Every evening | ORAL | Status: DC | PRN
  Administered 2011-10-28: 5 mg via ORAL
  Filled 2011-10-28: qty 1

## 2011-10-28 NOTE — Consult Note (Signed)
Reason for Consult:Alcohol Detox Referring Physician: Dr. Diannia Ruder is an 41 y.o. male.  HPI: Pt had a relapse, drinking alcohol for 3 days.  He says his sponsor became worried and friend of family brought him in.  He is depressed because relapse has further alienated his wife Barbette Or off his insurance] and may prohibit visits with his son.   Past Medical History  Diagnosis Date  . Alcohol abuse   . Anxiety   . Depression   . Bipolar 2 disorder   . Active smoker     Past Surgical History  Procedure Date  . Lymph node dissection     History reviewed. No pertinent family history.  Social History:  reports that he has been smoking.  He does not have any smokeless tobacco history on file. He reports that he drinks about 21.6 ounces of alcohol per week. His drug history not on file.  Allergies: No Known Allergies  Medications: I have reviewed the patient's current medications.  Results for orders placed during the hospital encounter of 10/26/11 (from the past 48 hour(s))  MAGNESIUM     Status: Normal   Collection Time   10/26/11  8:48 PM      Component Value Range Comment   Magnesium 2.0  1.5 - 2.5 (mg/dL)   COMPREHENSIVE METABOLIC PANEL     Status: Normal   Collection Time   10/28/11 10:31 AM      Component Value Range Comment   Sodium 139  135 - 145 (mEq/L)    Potassium 3.6  3.5 - 5.1 (mEq/L)    Chloride 105  96 - 112 (mEq/L)    CO2 26  19 - 32 (mEq/L)    Glucose, Bld 96  70 - 99 (mg/dL)    BUN 9  6 - 23 (mg/dL)    Creatinine, Ser 7.82  0.50 - 1.35 (mg/dL)    Calcium 9.1  8.4 - 10.5 (mg/dL)    Total Protein 6.8  6.0 - 8.3 (g/dL)    Albumin 3.5  3.5 - 5.2 (g/dL)    AST 29  0 - 37 (U/L)    ALT 30  0 - 53 (U/L)    Alkaline Phosphatase 58  39 - 117 (U/L)    Total Bilirubin 0.8  0.3 - 1.2 (mg/dL)    GFR calc non Af Amer >90  >90 (mL/min)    GFR calc Af Amer >90  >90 (mL/min)     No results found.  Review of Systems  Unable to perform ROS  Blood  pressure 118/79, pulse 74, temperature 97.7 F (36.5 C), temperature source Oral, resp. rate 20, height 5\' 10"  (1.778 m), weight 74.844 kg (165 lb), SpO2 100.00%. Physical Exam  Assessment/Plan: AXIS I   Depression, recurrent, moderate; Alcohol Dependence, Cannabis abuse AXIS II  Deferred AXIS III Alcohol Withdrawal AXIS IV Marital conflict, financial social stress AXIS IV GAF 55 prior GAF 40 Chart is reviewed, patient is interviewed.  He states he is feeling much better without symptoms of tremors or paraesthesias.  He denies suicidal/homidical ideation.  He was in rehab and had a relapse.  He was binge drinking for 3 days and says he did not call his sponsor or his family friend "for financial reasons - embarrassment".  He admits he had money for his  Drinking.  He feels great remorse because he has disappointed his wife and expects she will refuse to see him.  He says he is prepared to go  home and will call his sponsor when he knows the discharge date.  He has no medication for depression or to discourage alcohol.  He will need psychoeducation to understand the harm of taking alcohol with medications to treat his depression.  The Psych CSW is notified to make necessary referrals post-discharge. Revia has been recommended to discourage alcohol consumption - if pt is not taking any opioids.  This patient is sitting up, awake and alert.  He has spontaneous speech with good eye contact.  His thought process is organized and sequential.  He denies audio/visual hallucinations. He denies suicidal/homicidal ideation  He does not display any bizarre behaviors or psychotic thoughts.  His insight is poor His judgment is impaired. His reality testing is impaired. RECOMMENDATION: 1. Consider antidepressant medication Wellbutrin 75 mg 1 tab twice daily 2. Consider Depakote 250 mg twice daily OR Revia 50 mg daily 3. Will request Psych CSW to make referral to outpatient psychiatrist and therapist before discharge    4. Pt is cognitively intact and suicide risk is minimal  5.  Pt anticipates discharge to home and is not a suicide/homicide risk at this time   Zylpha Poynor 10/28/2011, 4:56 PM

## 2011-10-28 NOTE — Progress Notes (Signed)
  Subjective: He appears anxious and flushed   Objective: Vital signs in last 24 hours: Filed Vitals:   10/27/11 1451 10/27/11 2137 10/28/11 0600 10/28/11 1424  BP: 120/68 115/65 108/68 118/79  Pulse: 85 87 60 74  Temp: 97.9 F (36.6 C) 98.5 F (36.9 C) 98 F (36.7 C) 97.7 F (36.5 C)  TempSrc: Oral Oral Oral Oral  Resp: 18 18 18 20   Height:      Weight:      SpO2: 97% 98% 94% 100%   No intake or output data in the 24 hours ending 10/28/11 1722  Weight change:    General exam: Thinly built white male sitting in the stretcher, the entire face and upper chest is flushed  HEENT: Pupils are former 4 mm reactive to light, oral mucosa is dry  Neck no JVD or lymphadenopathy  CVS S1-S2 tachycardic regular rate and rhythm no murmurs rubs or gallops  Lungs clear to auscultation bilaterally  Abdomen soft mild epigastric tenderness no rigidity or rebound positive bowel sounds  Extremities no edema clubbing or cyanosis  Neuro fine tremors noted in upper extremity, otherwise moves all extremities no localizing signs no asterixis     Lab Results: Results for orders placed during the hospital encounter of 10/26/11 (from the past 24 hour(s))  COMPREHENSIVE METABOLIC PANEL     Status: Normal   Collection Time   10/28/11 10:31 AM      Component Value Range   Sodium 139  135 - 145 (mEq/L)   Potassium 3.6  3.5 - 5.1 (mEq/L)   Chloride 105  96 - 112 (mEq/L)   CO2 26  19 - 32 (mEq/L)   Glucose, Bld 96  70 - 99 (mg/dL)   BUN 9  6 - 23 (mg/dL)   Creatinine, Ser 1.61  0.50 - 1.35 (mg/dL)   Calcium 9.1  8.4 - 09.6 (mg/dL)   Total Protein 6.8  6.0 - 8.3 (g/dL)   Albumin 3.5  3.5 - 5.2 (g/dL)   AST 29  0 - 37 (U/L)   ALT 30  0 - 53 (U/L)   Alkaline Phosphatase 58  39 - 117 (U/L)   Total Bilirubin 0.8  0.3 - 1.2 (mg/dL)   GFR calc non Af Amer >90  >90 (mL/min)   GFR calc Af Amer >90  >90 (mL/min)     Micro: No results found for this or any previous visit (from the past 240  hour(s)).  Studies/Results: No results found.  Medications:  Scheduled Meds:   . enoxaparin  30 mg Subcutaneous Q24H  . LORazepam  0-4 mg Intravenous Q6H   Followed by  . LORazepam  0-4 mg Intravenous Q12H   Continuous Infusions:   . sodium chloride 75 mL/hr at 10/28/11 1719   PRN Meds:.acetaminophen, acetaminophen, LORazepam, LORazepam, ondansetron (ZOFRAN) IV, ondansetron   Assessment:   1. Alcohol withdrawal syndrome continue ciwa protocol, continued telemetry ,appreciate psyche consult .Started on depakote .Outpt psyche consult. 2. alcohol abuse social work consultation, continue thiamine and folic acid, magnesium level is okay  3. Depression started depakote today  4. dehydration with hemoconcentration, continue IV fluids   May dc in am if not in withdrawal       LOS: 2 days   [facility] 10/28/2011, 5:22 PM

## 2011-10-28 NOTE — Progress Notes (Signed)
Initial visit.  Saw pt on rounds in 5E.     Pt reported feeling anxious.   Pt is involved in [facility] in [place].  He reports that his relationship with AA is helpful and he longs to go back for support after discharge.  He feels that if he goes to a meeting every day, he is able abstain from drinking.   Pt reports that he did not go to a meeting Christmas day, as his wife did not want him to leave, and it became easier for him to miss the subsequent ones - which became a pattern until he began drinking again.  Pt has talked with a former AA sponsor, but has not been able to reach his current sponsor since he has been in hospital.    Pt spoke with chaplain about his relationship with his wife, which he feels is not supportive.  He reports that his wife is willing to attend AA meetings or family meetings with him.    Pt spoke with chaplain about goals after discharge, frustration with relapses, and not knowing "who I am" or "how to follow my calling."  Pt's faith figures prominently in pt's coping.  He reports that he prays every morning.  Chaplain provided support and Bible to pt.    Will continue to follow and provide support during admission.   Please page as needs arise.     10/28/11 1600  Clinical Encounter Type  Visited With Patient  Visit Type Initial;Spiritual support;Psychological support  Referral From Other (Comment) (Saw pt on rounds in 5E)  Spiritual Encounters  Spiritual Needs Emotional;Sacred text  Stress Factors  Patient Stress Factors Family relationships

## 2011-10-29 LAB — COMPREHENSIVE METABOLIC PANEL
AST: 30 U/L (ref 0–37)
Albumin: 3.5 g/dL (ref 3.5–5.2)
Calcium: 9.3 mg/dL (ref 8.4–10.5)
Creatinine, Ser: 0.86 mg/dL (ref 0.50–1.35)
GFR calc non Af Amer: >90 mL/min (ref >90)

## 2011-10-29 MED ORDER — LORAZEPAM 1 MG PO TABS: 1.0000 mg | ORAL_TABLET | Freq: Four times a day (QID) | ORAL | Status: AC | PRN

## 2011-10-29 MED ORDER — VALPROIC ACID 250 MG PO CAPS: 250.0000 mg | ORAL_CAPSULE | Freq: Two times a day (BID) | ORAL | Status: DC

## 2011-10-29 MED ORDER — ENOXAPARIN SODIUM 40 MG/0.4ML ~~LOC~~ SOLN
40.0000 mg | SUBCUTANEOUS | Status: DC
  Filled 2011-10-29: qty 0.4

## 2011-10-29 NOTE — Progress Notes (Signed)
Pharmacy - Brief Note Lovenox 30mg  SQ q24h for VTE prophylaxis  Wt 74.8 kg, SCr 0.9, CrCl 159mL/min  No bleeding reported.  P: Increase Lovenox to standard prophylactic dosage (40mg  SQ q24h) per standing orders.  Elie Goody, Pharm.D.  [phone] 10/29/2011 7:58 AM

## 2011-10-29 NOTE — Plan of Care (Signed)
Problem: Phase II Progression Outcomes Goal: IV changed to normal saline lock Outcome: Completed/Met Date Met:  10/29/11 NSL d/c

## 2011-10-29 NOTE — Discharge Summary (Signed)
Patient ID: [name] MRN: [id number] DOB/AGE: [birth year] 40 y.o.  Admit date: 10/26/2011 Discharge date: 10/29/2011  Primary Care Physician:  [name], MD, MD  Discharge Diagnoses:    Present on Admission:  .Alcohol abuse .Alcohol withdrawal syndrome .Depression  Active Problems:  Alcohol abuse  Alcohol withdrawal syndrome  Depression   Medication List  As of 10/29/2011  5:55 PM   TAKE these medications         LORazepam 1 MG tablet   Commonly known as: ATIVAN   Take 1 tablet (1 mg total) by mouth every 6 (six) hours as needed for anxiety (CIWA-AR > 8  -OR-  withdrawal symptoms:  anxiety, agitation, insomnia, diaphoresis, nausea, vomiting, tremors, tachycardia, or hypertension.).      valproic acid 250 MG capsule   Commonly known as: DEPAKENE   Take 1 capsule (250 mg total) by mouth 2 (two) times daily.            Disposition and Follow-up: with psychiatry in 1 week upon discharge  Consults:   1. psychiatry  Significant Diagnostic Studies:  No results found.  Brief H and P: 41 year old white male with history of alcohol abuse and dependence, was brought to the ER by his friends because he was seen shaking uncontrollably, restless, unsteady, and perspiring.  [name] reported that he drinks heavily on a daily basis, with intermittent episodes of sobriety. He usually drinks 36 beers a day he has been doing this for almost a week now, was drinking a little less heavily prior to that, he had one beer today and had a little less than his usual yesterday. Patient reported nausea, uncontrollable shakes, sweating and restlessness.  He denied any fevers or chills, denies recent trauma or injury, denies using any other prescription or illicit drug.   Physical Exam on Discharge:  Filed Vitals:   10/28/11 0600 10/28/11 1424 10/28/11 2150 10/29/11 0600  BP: 108/68 118/79 126/79 104/66  Pulse: 60 74 74 68  Temp: 98 F (36.7 C) 97.7 F (36.5 C) 97.4 F  (36.3 C) 97.2 F (36.2 C)  TempSrc: Oral Oral Oral Oral  Resp: 18 20 24 20   Height:      Weight:      SpO2: 94% 100% 98% 98%     Intake/Output Summary (Last 24 hours) at 10/29/11 1755 Last data filed at 10/29/11 1300  Gross per 24 hour  Intake   2580 ml  Output      0 ml  Net   2580 ml    General: Alert, awake, oriented x3, in no acute distress. HEENT: No bruits, no goiter. Heart: Regular rate and rhythm, without murmurs, rubs, gallops. Lungs: Clear to auscultation bilaterally. Abdomen: Soft, nontender, nondistended, positive bowel sounds. Extremities: No clubbing cyanosis or edema with positive pedal pulses. Neuro: Grossly intact, nonfocal.  CBC:    Component Value Date/Time   WBC 10.8* 10/26/2011 1320   HGB 17.5* 10/26/2011 1320   HCT 49.1 10/26/2011 1320   PLT 230 10/26/2011 1320   MCV 76.5* 10/26/2011 1320   NEUTROABS 8.2* 10/26/2011 1320   LYMPHSABS 1.9 10/26/2011 1320   MONOABS 0.7 10/26/2011 1320   EOSABS 0.0 10/26/2011 1320   BASOSABS 0.0 10/26/2011 1320    Basic Metabolic Panel:    Component Value Date/Time   NA 138 10/29/2011 0500   K 3.6 10/29/2011 0500   CL 103 10/29/2011 0500   CO2 25 10/29/2011 0500   BUN 8 10/29/2011 0500   CREATININE 0.86 10/29/2011  0500   GLUCOSE 83 10/29/2011 0500   CALCIUM 9.3 10/29/2011 0500    Hospital Course:   Active Problems:   Alcohol withdrawal syndrome - patient is no longer actively withdrawing - stable to be discharged home   Depression - patient was seen by psych and recommendation was for Depakote 350 mg BID - Wellbutrin apparently patent had intolerance to so we will not prescribe this treatment - patient verbalized understanding in importance to follow up with psychiatry  Time spent on Discharge: Greater than 30 minutes  Signed: Shalay Carder 10/29/2011, 5:55 PM

## 2011-10-29 NOTE — Progress Notes (Signed)
Spoke with patient at bedside. States he is ready for d/c home, has a PCP (Dr. Valentina Lucks) that follows him for primary care needs. States he has a friend on his way to get him. No issues with obtaining meds. Plans to restart AA, has a sponsor.

## 2011-10-30 NOTE — Progress Notes (Signed)
Pt d/c'd before being seen by CSW.  MD asking if CSW will call Pt to assist with outpt provider appts.  LM for Pt.  Notified MD.  Providence Crosby, LCSWA Clinical Social Work [phone]

## 2011-11-09 ENCOUNTER — Inpatient Hospital Stay (HOSPITAL_COMMUNITY)
Admission: EM | Admit: 2011-11-09 | Discharge: 2011-11-10 | DRG: 894 | Discharge status: Against Medical Advice | Payer: Self-pay | Attending: Internal Medicine | Admitting: Internal Medicine

## 2011-11-09 ENCOUNTER — Encounter (HOSPITAL_COMMUNITY): Payer: Self-pay | Admitting: *Deleted

## 2011-11-09 DIAGNOSIS — F3189 Other bipolar disorder: Secondary | ICD-10-CM | POA: Diagnosis present

## 2011-11-09 DIAGNOSIS — F17200 Nicotine dependence, unspecified, uncomplicated: Secondary | ICD-10-CM | POA: Diagnosis present

## 2011-11-09 DIAGNOSIS — F10939 Alcohol use, unspecified with withdrawal, unspecified: Principal | ICD-10-CM | POA: Diagnosis present

## 2011-11-09 DIAGNOSIS — F32A Depression, unspecified: Secondary | ICD-10-CM | POA: Diagnosis present

## 2011-11-09 DIAGNOSIS — F341 Dysthymic disorder: Secondary | ICD-10-CM | POA: Diagnosis present

## 2011-11-09 DIAGNOSIS — F329 Major depressive disorder, single episode, unspecified: Secondary | ICD-10-CM | POA: Diagnosis present

## 2011-11-09 DIAGNOSIS — F10239 Alcohol dependence with withdrawal, unspecified: Secondary | ICD-10-CM

## 2011-11-09 DIAGNOSIS — R45851 Suicidal ideations: Secondary | ICD-10-CM | POA: Diagnosis present

## 2011-11-09 DIAGNOSIS — F1010 Alcohol abuse, uncomplicated: Secondary | ICD-10-CM | POA: Diagnosis present

## 2011-11-09 LAB — DIFFERENTIAL
Eosinophils Relative: 2 % (ref 0–5)
Lymphocytes Relative: 47 % — ABNORMAL HIGH (ref 12–46)
Lymphs Abs: 2.3 10*3/uL (ref 0.7–4.0)
Neutro Abs: 2.2 10*3/uL (ref 1.7–7.7)
Neutrophils Relative %: 44 % (ref 43–77)

## 2011-11-09 LAB — COMPREHENSIVE METABOLIC PANEL
ALT: 27 U/L (ref 0–53)
Alkaline Phosphatase: 66 U/L (ref 39–117)
CO2: 29 mEq/L (ref 19–32)
Chloride: 102 mEq/L (ref 96–112)
GFR calc Af Amer: >90 mL/min (ref >90)
GFR calc non Af Amer: >90 mL/min (ref >90)
Glucose, Bld: 101 mg/dL — ABNORMAL HIGH (ref 70–99)
Potassium: 3.6 mEq/L (ref 3.5–5.1)
Sodium: 143 mEq/L (ref 135–145)
Total Bilirubin: 0.3 mg/dL (ref 0.3–1.2)

## 2011-11-09 LAB — CBC
HCT: 45.9 % (ref 39.0–52.0)
Hemoglobin: 15.7 g/dL (ref 13.0–17.0)
MCH: 27.1 pg (ref 26.0–34.0)
MCHC: 34.2 g/dL (ref 30.0–36.0)
MCV: 79.1 fL (ref 78.0–100.0)
Platelets: 227 K/uL (ref 150–400)
RBC: 5.8 MIL/uL (ref 4.22–5.81)
RDW: 14.5 % (ref 11.5–15.5)
WBC: 5 K/uL (ref 4.0–10.5)

## 2011-11-09 LAB — ETHANOL: Alcohol, Ethyl (B): 186 mg/dL — ABNORMAL HIGH (ref 0–11)

## 2011-11-09 LAB — MRSA PCR SCREENING: MRSA by PCR: NEGATIVE

## 2011-11-09 LAB — RAPID URINE DRUG SCREEN, HOSP PERFORMED: Barbiturates: NOT DETECTED

## 2011-11-09 LAB — MAGNESIUM: Magnesium: 1.9 mg/dL (ref 1.5–2.5)

## 2011-11-09 MED ORDER — LORAZEPAM 0.5 MG PO TABS
1.0000 mg | ORAL_TABLET | Freq: Four times a day (QID) | ORAL | Status: DC | PRN
  Administered 2011-11-09: 1 mg via ORAL
  Filled 2011-11-09 (×2): qty 1

## 2011-11-09 MED ORDER — LORAZEPAM 2 MG/ML IJ SOLN
0.0000 mg | Freq: Four times a day (QID) | INTRAMUSCULAR | Status: DC
  Administered 2011-11-09 – 2011-11-10 (×4): 2 mg via INTRAVENOUS
  Filled 2011-11-09 (×4): qty 1

## 2011-11-09 MED ORDER — LORAZEPAM 2 MG/ML IJ SOLN
1.0000 mg | INTRAMUSCULAR | Status: DC | PRN
  Administered 2011-11-09 – 2011-11-10 (×2): 1 mg via INTRAVENOUS
  Filled 2011-11-09 (×2): qty 1

## 2011-11-09 MED ORDER — FOLIC ACID 1 MG PO TABS
1.0000 mg | ORAL_TABLET | Freq: Every day | ORAL | Status: DC
  Administered 2011-11-09 – 2011-11-10 (×2): 1 mg via ORAL
  Filled 2011-11-09 (×2): qty 1

## 2011-11-09 MED ORDER — LORAZEPAM 0.5 MG PO TABS: 1.0000 mg | ORAL_TABLET | ORAL | Status: DC | PRN

## 2011-11-09 MED ORDER — NICOTINE 21 MG/24HR TD PT24
21.0000 mg | MEDICATED_PATCH | Freq: Once | TRANSDERMAL | Status: AC
  Administered 2011-11-09: 21 mg via TRANSDERMAL
  Filled 2011-11-09: qty 1

## 2011-11-09 MED ORDER — ENOXAPARIN SODIUM 40 MG/0.4ML ~~LOC~~ SOLN
40.0000 mg | Freq: Every day | SUBCUTANEOUS | Status: DC
  Administered 2011-11-09: 40 mg via SUBCUTANEOUS
  Filled 2011-11-09 (×2): qty 0.4

## 2011-11-09 MED ORDER — SODIUM CHLORIDE 0.45 % IV SOLN
INTRAVENOUS | Status: DC
  Administered 2011-11-09 – 2011-11-10 (×2): via INTRAVENOUS

## 2011-11-09 MED ORDER — THIAMINE HCL 100 MG/ML IJ SOLN
100.0000 mg | Freq: Every day | INTRAMUSCULAR | Status: DC
  Filled 2011-11-09 (×2): qty 1

## 2011-11-09 MED ORDER — LORAZEPAM 2 MG/ML IJ SOLN
1.0000 mg | Freq: Once | INTRAMUSCULAR | Status: AC
  Administered 2011-11-09: 1 mg via INTRAVENOUS
  Filled 2011-11-09: qty 1

## 2011-11-09 MED ORDER — LORAZEPAM 2 MG/ML IJ SOLN: 1.0000 mg | Freq: Once | INTRAMUSCULAR | Status: DC

## 2011-11-09 MED ORDER — LORAZEPAM 2 MG/ML IJ SOLN: 0.0000 mg | Freq: Two times a day (BID) | INTRAMUSCULAR | Status: DC

## 2011-11-09 MED ORDER — NICOTINE 21 MG/24HR TD PT24
21.0000 mg | MEDICATED_PATCH | Freq: Every day | TRANSDERMAL | Status: DC
  Administered 2011-11-09 – 2011-11-10 (×2): 21 mg via TRANSDERMAL
  Filled 2011-11-09 (×2): qty 1

## 2011-11-09 MED ORDER — M.V.I. ADULT IV INJ
INJECTION | Freq: Once | INTRAVENOUS | Status: AC
  Administered 2011-11-09: 14:00:00 via INTRAVENOUS
  Filled 2011-11-09: qty 1000

## 2011-11-09 MED ORDER — VITAMIN B-1 100 MG PO TABS
100.0000 mg | ORAL_TABLET | Freq: Every day | ORAL | Status: DC
  Administered 2011-11-09 – 2011-11-10 (×2): 100 mg via ORAL
  Filled 2011-11-09 (×2): qty 1

## 2011-11-09 MED ORDER — SODIUM CHLORIDE 0.9 % IV SOLN: Freq: Once | INTRAVENOUS | Status: DC

## 2011-11-09 MED ORDER — LORAZEPAM 2 MG/ML IJ SOLN: 2.0000 mg | Freq: Once | INTRAMUSCULAR | Status: DC

## 2011-11-09 MED ORDER — LORAZEPAM 2 MG/ML IJ SOLN: 1.0000 mg | Freq: Four times a day (QID) | INTRAMUSCULAR | Status: DC | PRN

## 2011-11-09 MED ORDER — PANTOPRAZOLE SODIUM 40 MG IV SOLR
40.0000 mg | Freq: Every day | INTRAVENOUS | Status: DC
  Administered 2011-11-09: 40 mg via INTRAVENOUS
  Filled 2011-11-09 (×2): qty 40

## 2011-11-09 MED ORDER — ALBUTEROL SULFATE (5 MG/ML) 0.5% IN NEBU: 2.5000 mg | INHALATION_SOLUTION | RESPIRATORY_TRACT | Status: DC | PRN

## 2011-11-09 MED ORDER — SODIUM CHLORIDE 0.9 % IV BOLUS (SEPSIS)
1000.0000 mL | Freq: Once | INTRAVENOUS | Status: AC
  Administered 2011-11-09: 1000 mL via INTRAVENOUS

## 2011-11-09 MED ORDER — DOCUSATE SODIUM 100 MG PO CAPS
100.0000 mg | ORAL_CAPSULE | Freq: Two times a day (BID) | ORAL | Status: DC
  Administered 2011-11-09 – 2011-11-10 (×2): 100 mg via ORAL
  Filled 2011-11-09 (×3): qty 1

## 2011-11-09 MED ORDER — ACETAMINOPHEN 650 MG RE SUPP: 650.0000 mg | Freq: Four times a day (QID) | RECTAL | Status: DC | PRN

## 2011-11-09 MED ORDER — LORAZEPAM 1 MG PO TABS: 1.0000 mg | ORAL_TABLET | Freq: Four times a day (QID) | ORAL | Status: DC | PRN

## 2011-11-09 MED ORDER — ONDANSETRON HCL 4 MG PO TABS: 4.0000 mg | ORAL_TABLET | Freq: Four times a day (QID) | ORAL | Status: DC | PRN

## 2011-11-09 MED ORDER — ADULT MULTIVITAMIN W/MINERALS CH
1.0000 | ORAL_TABLET | Freq: Every day | ORAL | Status: DC
  Administered 2011-11-09 – 2011-11-10 (×2): 1 via ORAL
  Filled 2011-11-09 (×2): qty 1

## 2011-11-09 MED ORDER — ONDANSETRON HCL 4 MG/2ML IJ SOLN
4.0000 mg | Freq: Four times a day (QID) | INTRAMUSCULAR | Status: DC | PRN
  Administered 2011-11-09: 4 mg via INTRAVENOUS
  Filled 2011-11-09: qty 2

## 2011-11-09 MED ORDER — BIOTENE DRY MOUTH MT LIQD
15.0000 mL | Freq: Two times a day (BID) | OROMUCOSAL | Status: DC
  Administered 2011-11-09 – 2011-11-10 (×2): 15 mL via OROMUCOSAL

## 2011-11-09 MED ORDER — LORAZEPAM 2 MG/ML IJ SOLN
1.0000 mg | Freq: Once | INTRAMUSCULAR | Status: AC
  Administered 2011-11-09: 1 mg via INTRAVENOUS

## 2011-11-09 MED ORDER — ACETAMINOPHEN 325 MG PO TABS
650.0000 mg | ORAL_TABLET | Freq: Four times a day (QID) | ORAL | Status: DC | PRN
  Administered 2011-11-09 – 2011-11-10 (×2): 650 mg via ORAL
  Filled 2011-11-09 (×2): qty 2

## 2011-11-09 NOTE — ED Notes (Signed)
Pt reports going through etoh withdrawals, last drank last night. Pt very anxious and shaking at triage. Airway intact.

## 2011-11-09 NOTE — Progress Notes (Signed)
[name] is an 41 y.o. male.    PCP: [name], MD, MD   Chief Complaint: Nausea, vomiting, and shaking  HPI: This is a 41 year old, Caucasian male, with a past medical history that is really unremarkable for any chronic medical problems. He was admitted in January for alcohol withdrawal. He was seen by psychiatry at that time, and the recommendations were to put the patient on Wellbutrin and Depakote. It appears that he was discharged on Depakote, but patient denies taking the medication at this time. He, apparently, had some form of intolerance to Wellbutrin.  He was discharged home on January 23rd. He did okay for a few days. But he went into what he calls,'manic depression'. He's been having a lot of financial trouble recently. So, he started drinking again about 4 days ago. He's had about 30-40 cans of beer on a daily basis. He went through a bottle of vodka. He had his last drink at 10 PM last night. However, when he woke up this morning he had a cup of ice house beer. And this morning he started having nausea, vomiting, sweating, denies any syncopal episodes. He started shaking a lot. He started feeling very poorly and sick. So, he took a shower and decided to come in to the emergency department. Patient denies any chest pain or shortness of breath. He tells me he has had occasional abdominal pains, but doesn't have any pain currently. He is very tearful and concerned about how his medical condition and his alcoholism will affect his son.    Home Medications: Prior to Admission medications   Medication Sig Start Date End Date Taking? Authorizing Provider  LORazepam (ATIVAN) 1 MG tablet Take 1 tablet (1 mg total) by mouth every 6 (six) hours as needed for anxiety (CIWA-AR > 8  -OR-  withdrawal symptoms:  anxiety, agitation, insomnia, diaphoresis, nausea, vomiting, tremors, tachycardia, or hypertension.). 10/29/11 11/08/11  Manson Passey, MD    Allergies: No Known  Allergies  Past Medical History: Past Medical History  Diagnosis Date  . Alcohol abuse   . Anxiety   . Depression   . Bipolar 2 disorder   . Active smoker     Past Surgical History  Procedure Date  . Lymph node dissection     Social History:  reports that he has been smoking.  He does not have any smokeless tobacco history on file. He reports that he drinks about 21.6 ounces of alcohol per week. He reports that he uses illicit drugs (Marijuana).  Family History:  Family History  Problem Relation Age of Onset  . Alcohol abuse Father     Review of Systems - History obtained from the patient General ROS: positive for  - fatigue Psychological ROS: positive for - anxiety, depression, irritability and denies suicidal or homicidal ideation Ophthalmic ROS: negative ENT ROS: negative Allergy and Immunology ROS: negative Hematological and Lymphatic ROS: negative Endocrine ROS: negative Respiratory ROS: no cough, shortness of breath, or wheezing Cardiovascular ROS: no chest pain or dyspnea on exertion Gastrointestinal ROS: negative Genito-Urinary ROS: no dysuria, trouble voiding, or hematuria Musculoskeletal ROS: negative Neurological ROS: negative Dermatological ROS: negative  Physical Examination Blood pressure 129/82, pulse 108, temperature 97.6 F (36.4 C), temperature source Oral, resp. rate 22, SpO2 99.00%.  General appearance: alert, cooperative, flushed and no distress Head: Normocephalic, without obvious abnormality, atraumatic Eyes: conjunctivae/corneas clear. PERRL, EOM's intact. Fundi benign. Throat: dry mm Neck: no adenopathy, no carotid bruit, no JVD, supple, symmetrical, trachea midline  and thyroid not enlarged, symmetric, no tenderness/mass/nodules Back: symmetric, no curvature. ROM normal. No CVA tenderness. Resp: clear to auscultation bilaterally Cardio: tachycardic, regular, no murmur, click, rub or gallop GI: soft, non-tender; bowel sounds normal; no  masses,  no organomegaly Extremities: extremities normal, atraumatic, no cyanosis or edema Pulses: 2+ and symmetric Skin: flushed, sweating Lymph nodes: Cervical, supraclavicular, and axillary nodes normal. Neurologic: Grossly normal  Laboratory Data: Results for orders placed during the hospital encounter of 11/09/11 (from the past 48 hour(s))  CBC     Status: Normal   Collection Time   11/09/11 11:14 AM      Component Value Range Comment   WBC 5.0  4.0 - 10.5 (K/uL)    RBC 5.80  4.22 - 5.81 (MIL/uL)    Hemoglobin 15.7  13.0 - 17.0 (g/dL)    HCT 78.2  95.6 - 21.3 (%)    MCV 79.1  78.0 - 100.0 (fL)    MCH 27.1  26.0 - 34.0 (pg)    MCHC 34.2  30.0 - 36.0 (g/dL)    RDW 08.6  57.8 - 46.9 (%)    Platelets 227  150 - 400 (K/uL)   DIFFERENTIAL     Status: Abnormal   Collection Time   11/09/11 11:14 AM      Component Value Range Comment   Neutrophils Relative 44  43 - 77 (%)    Neutro Abs 2.2  1.7 - 7.7 (K/uL)    Lymphocytes Relative 47 (*) 12 - 46 (%)    Lymphs Abs 2.3  0.7 - 4.0 (K/uL)    Monocytes Relative 7  3 - 12 (%)    Monocytes Absolute 0.3  0.1 - 1.0 (K/uL)    Eosinophils Relative 2  0 - 5 (%)    Eosinophils Absolute 0.1  0.0 - 0.7 (K/uL)    Basophils Relative 1  0 - 1 (%)    Basophils Absolute 0.0  0.0 - 0.1 (K/uL)   COMPREHENSIVE METABOLIC PANEL     Status: Abnormal   Collection Time   11/09/11 11:14 AM      Component Value Range Comment   Sodium 143  135 - 145 (mEq/L)    Potassium 3.6  3.5 - 5.1 (mEq/L)    Chloride 102  96 - 112 (mEq/L)    CO2 29  19 - 32 (mEq/L)    Glucose, Bld 101 (*) 70 - 99 (mg/dL)    BUN 8  6 - 23 (mg/dL)    Creatinine, Ser 6.29  0.50 - 1.35 (mg/dL)    Calcium 9.1  8.4 - 10.5 (mg/dL)    Total Protein 8.1  6.0 - 8.3 (g/dL)    Albumin 4.2  3.5 - 5.2 (g/dL)    AST 29  0 - 37 (U/L)    ALT 27  0 - 53 (U/L)    Alkaline Phosphatase 66  39 - 117 (U/L)    Total Bilirubin 0.3  0.3 - 1.2 (mg/dL)    GFR calc non Af Amer >90  >90 (mL/min)    GFR calc Af  Amer >90  >90 (mL/min)   ETHANOL     Status: Abnormal   Collection Time   11/09/11 11:14 AM      Component Value Range Comment   Alcohol, Ethyl (B) 186 (*) 0 - 11 (mg/dL)   URINE RAPID DRUG SCREEN (HOSP PERFORMED)     Status: Abnormal   Collection Time   11/09/11 11:29 AM  Component Value Range Comment   Opiates NONE DETECTED  NONE DETECTED     Cocaine NONE DETECTED  NONE DETECTED     Benzodiazepines POSITIVE (*) NONE DETECTED     Amphetamines NONE DETECTED  NONE DETECTED     Tetrahydrocannabinol NONE DETECTED  NONE DETECTED     Barbiturates NONE DETECTED  NONE DETECTED      Radiology Reports: No results found.   Assessment/Plan  Principal Problem:  *Alcohol withdrawal syndrome Active Problems:  Alcohol abuse  Depression   #1 Alcohol withdrawal syndrome/alcohol abuse: Patient will be put on the Ativan protocol. Because there is possibility of clinical worsening, patient will be monitored in the step down floor for tonight. He'll be given thiamine. Magnesium levels will be checked.  #2 Depression: Psychiatry consultation will be obtained again. Patient may require inpatient psychiatric treatment. At this time will hold off on initiating any new medications. He's not been taking the Depakote. Hold off on that till seen by psychiatry. However, he denies any suicidal or homicidal ideation.  #3 DVT prophylaxis will be provided.  Patient is a full code.  Further management decisions will depend on results of further testing and patient's response to treatment.   Dr. Kirby Funk will assume care in the morning.   [facility]  [facility] Pager [phone]  11/09/2011, 2:54 PM

## 2011-11-09 NOTE — ED Notes (Signed)
Pt denies HI/SI.

## 2011-11-09 NOTE — ED Provider Notes (Signed)
History     CSN: [id number]  Arrival date & time 11/09/11  1017   First MD Initiated Contact with Patient 11/09/11 1104      Chief Complaint  Patient presents with  . Medical Clearance    (Consider location/radiation/quality/duration/timing/severity/associated sxs/prior treatment) Patient is a 41 y.o. male presenting with alcohol problem. The history is provided by the patient.  Alcohol Problem  Pt presents with alcohol withdrawal. States that he can go months without drinking but has been binging on up to 36-40 beers/day for the past 4 days. Last drink was sometime last evening. He feels very nervous and is shaking; admits HA but denies other sx. Denies nausea, vomiting, abd pain, hematemesis, dark stools/BRBPR, weakness. He denies SI/HI; has hx of depression and bipolar d/o.  He is a smoker. Admits cannabis use but denies other illicit drug use.  Pt was hospitalized on 1/20 for observation for etoh withdrawal.  Past Medical History  Diagnosis Date  . Alcohol abuse   . Anxiety   . Depression   . Bipolar 2 disorder   . Active smoker     Past Surgical History  Procedure Date  . Lymph node dissection     History reviewed. No pertinent family history. Strong family hx of etoh/drug abuse.  History  Substance Use Topics  . Smoking status: Current Everyday Smoker -- 2.0 packs/day for 20 years  . Smokeless tobacco: Not on file  . Alcohol Use: 21.6 oz/week    36 Cans of beer per week     heavy       Review of Systems  All other systems reviewed and are negative.    Allergies  Review of patient's allergies indicates no known allergies.  Home Medications   Current Outpatient Rx  Name Route Sig Dispense Refill  . LORAZEPAM 1 MG PO TABS Oral Take 1 tablet (1 mg total) by mouth every 6 (six) hours as needed for anxiety (CIWA-AR > 8  -OR-  withdrawal symptoms:  anxiety, agitation, insomnia, diaphoresis, nausea, vomiting, tremors, tachycardia, or hypertension.). 30  tablet 0    BP 122/82  Pulse 95  Temp(Src) 97.6 F (36.4 C) (Oral)  Resp 20  SpO2 96%  Physical Exam  Nursing note and vitals reviewed. Constitutional: He is oriented to person, place, and time. He appears well-developed and well-nourished. No distress.  HENT:  Head: Normocephalic and atraumatic.  Eyes: Conjunctivae and EOM are normal. Pupils are equal, round, and reactive to light. Right eye exhibits no discharge. Left eye exhibits no discharge.  Neck: Normal range of motion.  Cardiovascular: Normal rate, regular rhythm and normal heart sounds.  Exam reveals no gallop and no friction rub.   No murmur heard. Pulmonary/Chest: Effort normal and breath sounds normal.  Abdominal: Soft. There is no tenderness.  Musculoskeletal: Normal range of motion.  Neurological: He is alert and oriented to person, place, and time. He has normal strength. No cranial nerve deficit or sensory deficit. Coordination normal. GCS eye subscore is 4. GCS verbal subscore is 5. GCS motor subscore is 6.  Skin: Skin is warm and dry. He is not diaphoretic.  Psychiatric: His speech is normal. His mood appears anxious. He is agitated. He expresses no homicidal and no suicidal ideation.       Extremely anxious appearing. Tremulous.    ED Course  Procedures (including critical care time)  Labs Reviewed  DIFFERENTIAL - Abnormal; Notable for the following:    Lymphocytes Relative 47 (*)    All other  components within normal limits  COMPREHENSIVE METABOLIC PANEL - Abnormal; Notable for the following:    Glucose, Bld 101 (*)    All other components within normal limits  URINE RAPID DRUG SCREEN (HOSP PERFORMED) - Abnormal; Notable for the following:    Benzodiazepines POSITIVE (*)    All other components within normal limits  ETHANOL - Abnormal; Notable for the following:    Alcohol, Ethyl (B) 186 (*)    All other components within normal limits  CBC   No results found.   1. Alcohol withdrawal syndrome   2.  Depression       MDM  11:25 AM CIWA of 13. 2 mg Ativan and IVF given. Seizure precautions.  1:58 PM Pt remedicated with Ativan. Banana bag given. Called [facility] - they will see once stabilized. Will require admit for observation.  2:10 PM Discussed with Dr. Weldon Inches, who saw pt with me. Additional 2 mg of Ativan given. Call to medicine for admit - concern for going into DTs.       Grant Fontana, [place] 11/09/11 1428

## 2011-11-09 NOTE — ED Notes (Addendum)
Pt requesting detox from ETOH, reports drinking 36-40 beers/day, started drinking age 41, reports he is a binge drinker, comes from a family of alcohol and drug abuse, pt reports drinking 36-40 beers/day x4 days, last drank last night approx 2230. Pt reports being in and out of rehab several times. Pt tearful states "I don't know why I can't quit drinking." Pt reports he has a 75 yr old son and he does'nt want his son to be disappointed of him. Pt reports not eating x3-4 days

## 2011-11-09 NOTE — ED Provider Notes (Addendum)
Medical screening examination/treatment/procedure(s) were conducted as a shared visit with non-physician practitioner(s) and myself.  I personally evaluated the patient during the encounter 41 year old, male, admits to being an alcoholic.  He requests alcohol detox.  He has been on a binge for the past 4 days.  He states he has never gone through DTs before, but he has passed.  Out.  When he has been intoxicated in the past.  His last drink was last night.  On physical examination.  He is tremulous, erythematous, and tachycardic.  We'll perform screening evaluation and treating him for alcohol withdrawal.  Then, we will arrange for admission.  Nicholes Stairs, MD 11/09/11 1416  2:25 PM Spoke with [facility].  Dr. Rito Ehrlich.  He will come eval to determine if pt needs stepdown or will be safe with tele alone.   Nicholes Stairs, MD 11/09/11 1426

## 2011-11-10 LAB — CBC
HCT: 42.3 % (ref 39.0–52.0)
Hemoglobin: 13.8 g/dL (ref 13.0–17.0)
MCHC: 32.6 g/dL (ref 30.0–36.0)
RBC: 5.28 MIL/uL (ref 4.22–5.81)

## 2011-11-10 LAB — COMPREHENSIVE METABOLIC PANEL
ALT: 20 U/L (ref 0–53)
Calcium: 9.1 mg/dL (ref 8.4–10.5)
Creatinine, Ser: 0.83 mg/dL (ref 0.50–1.35)
GFR calc Af Amer: >90 mL/min (ref >90)
Glucose, Bld: 82 mg/dL (ref 70–99)
Sodium: 139 mEq/L (ref 135–145)
Total Protein: 6.4 g/dL (ref 6.0–8.3)

## 2011-11-10 LAB — PROTIME-INR
INR: 0.97 (ref 0.00–1.49)
Prothrombin Time: 13.1 seconds (ref 11.6–15.2)

## 2011-11-10 LAB — APTT: aPTT: 31 seconds (ref 24–37)

## 2011-11-10 NOTE — Progress Notes (Signed)
Clinical Social Work Psychiatry  Assessment    Presenting Symptoms/Problems: Patient presents after recent dc from [facility] hospital with binge drinking and "manic depression" per his report.  Reports he can go 6-9 months without a drink, and then is triggered by financial problems, relationship issues, no job, ect which causes him to return to drinking and his recent relapse.  Upon meeting patient, he reports he is ready to be discharged. Reports he has an appointment tomorrow at an inpatient substance abuse facility: [facility] and also a job interview in which he is very excited about.    Psychiatric History: Patient reports he has been to [facility] and [facility] for multiple times for detox and is interested in inpatient treatment at [facility] in which he has an appointment on 2/5.     Family Collateral Information: No family present during interview.  Reports he has supervised visits with his son and not a good relationship with son's mother. Reports he has friends within the community and also multiple sponsors who provide care for him if he is in crisis.  Reports he goes to AA for additional treatment as well.                           Emotional Health/Current Symptoms:   Upon meeting patient, patient was pacing the room and immediately asked if he could be discharge, because he felt a lot better and was ready to go home.  CSW and patient discussed patient multiple admissions for detox and also extensive alcohol history since he was 41 years old.  Patient reports he has already set up an appointment for [facility] for treatment and is eager to get started on 2/5 and also is hopeful for a job interview on 2/5 as well for additional income.  Patient reports he recently left the hospital in which he went home and was doing well until he was cleaning his home and found a bottle of liquor.  Patient reports he was not strong enough, was triggered by the urge to drink and began binge drinking again.  Patient mood was  motivated and did not appear depressed.  Patient reports he is ready to go and wants to be discharged home.  Patient reports he is motivated to speak with his sponsor and also community friends for additional support.  CSW challenged patient and used reality therapy to entice patient about his recent relapse and again admitted to the hospital.  Patient took ownership regarding situation, but reports this time will be different in which he will follow up with treatment and has outpatient treatment already established.  Patient reports he is not feeling depressed at this time, understand he tremors but that is because of his nerves.  Reports he is not experiencing psychosis, AVH, or SI, plan or intent.  Patient wants to go home and follow up in the outpatient.    Interpretive Summary/Anticipated DC Plan:    1.  Patient has an appointment for inpatient treatment on 2/5 per his report and a job interview in which he is ready to leave and go home.   Agreeable to wait and hear recommendations from MD before leaving.   2.  Reports he is feeling better and nauesa and abdominal pain have decreased.  3.  Discussed with patient about options of inpatient treatment and SA IOP and patient agreeable with plan to [facility].  4.  Will follow up with Psych MD with regards to recommendations and plan.  Ashley Jacobs, MSW LCSW [phone]

## 2011-11-10 NOTE — Progress Notes (Signed)
Pt kept saying that he needs to go home to "take care of some business." After multiple long talks with pt, he still wants to go home "right now." Dr. Valentina Lucks was notified. Pt is told that his MD does not recommend that he goes home before seeing the psychiatrist, but pt still refused to stay. Dr. Valentina Lucks is notified a second time and pt signed the "LEAVING HOSPITAL AGAINST MEDICAL ADVICE" form.

## 2011-11-10 NOTE — Progress Notes (Signed)
Subjective: Very depressed.  Third detox in last few weeks  Objective: Vital signs in last 24 hours: Temp:  [97.6 F (36.4 C)-99.5 F (37.5 C)] 97.8 F (36.6 C) (02/04 0400) Pulse Rate:  [68-110] 71  (02/04 0600) Resp:  [11-23] 18  (02/04 0600) BP: (104-155)/(58-104) 119/79 mmHg (02/04 0600) SpO2:  [94 %-99 %] 96 % (02/04 0600) Weight:  [71.3 kg (157 lb 3 oz)-73 kg (160 lb 15 oz)] 73 kg (160 lb 15 oz) (02/04 0500) Weight change:     Intake/Output from previous day: 02/03 0701 - 02/04 0700 In: 3140 [P.O.:840; I.V.:2300] Out: 1445 [Urine:1445] Intake/Output this shift:    General appearance: alert and cooperative Resp: clear to auscultation bilaterally Cardio: regular rate and rhythm, S1, S2 normal, no murmur, click, rub or gallop GI: soft, non-tender; bowel sounds normal; no masses,  no organomegaly Extremities: extremities normal, atraumatic, no cyanosis or edema  Lab Results:  Basename 11/10/11 0435 11/09/11 1114  WBC 6.3 5.0  HGB 13.8 15.7  HCT 42.3 45.9  PLT 219 227   BMET  Basename 11/10/11 0435 11/09/11 1114  NA 139 143  K 3.7 3.6  CL 101 102  CO2 31 29  GLUCOSE 82 101*  BUN 8 8  CREATININE 0.83 0.72  CALCIUM 9.1 9.1    Studies/Results: No results found.  Medications: I have reviewed the patient's current medications.  Assessment/Plan: Principal Problem:  *Alcohol withdrawal syndrome  Continue alcohol WD protocol, OOB and ambulate, advance diet Active Problems:  Alcohol abuse  Depression, h/o bipolar disorder.  Psych Consult   LOS: 1 day   Amritha Yorke JOSEPH 11/10/2011, 7:12 AM

## 2011-11-10 NOTE — ED Provider Notes (Signed)
Medical screening examination/treatment/procedure(s) were conducted as a shared visit with non-physician practitioner(s) and myself.  I personally evaluated the patient during the encounter  Dyanara Cozza P Shatavia Santor, MD 11/10/11 1455 

## 2011-11-10 NOTE — Progress Notes (Signed)
Pt just left his room to go home

## 2011-11-15 ENCOUNTER — Inpatient Hospital Stay (HOSPITAL_COMMUNITY)
Admission: EM | Admit: 2011-11-15 | Discharge: 2011-11-18 | DRG: 897 | Disposition: A | Discharge status: Home / Self Care | Payer: Self-pay | Attending: Internal Medicine | Admitting: Internal Medicine

## 2011-11-15 ENCOUNTER — Encounter (HOSPITAL_COMMUNITY): Payer: Self-pay | Admitting: Emergency Medicine

## 2011-11-15 DIAGNOSIS — F1010 Alcohol abuse, uncomplicated: Secondary | ICD-10-CM | POA: Diagnosis present

## 2011-11-15 DIAGNOSIS — F1020 Alcohol dependence, uncomplicated: Secondary | ICD-10-CM | POA: Diagnosis present

## 2011-11-15 DIAGNOSIS — R000 Tachycardia, unspecified: Secondary | ICD-10-CM | POA: Diagnosis present

## 2011-11-15 DIAGNOSIS — F329 Major depressive disorder, single episode, unspecified: Secondary | ICD-10-CM | POA: Diagnosis present

## 2011-11-15 DIAGNOSIS — F32A Depression, unspecified: Secondary | ICD-10-CM | POA: Diagnosis present

## 2011-11-15 DIAGNOSIS — F3289 Other specified depressive episodes: Secondary | ICD-10-CM | POA: Diagnosis present

## 2011-11-15 DIAGNOSIS — F10939 Alcohol use, unspecified with withdrawal, unspecified: Principal | ICD-10-CM | POA: Diagnosis present

## 2011-11-15 DIAGNOSIS — F10239 Alcohol dependence with withdrawal, unspecified: Principal | ICD-10-CM | POA: Diagnosis present

## 2011-11-15 DIAGNOSIS — E876 Hypokalemia: Secondary | ICD-10-CM | POA: Diagnosis present

## 2011-11-15 DIAGNOSIS — R45851 Suicidal ideations: Secondary | ICD-10-CM | POA: Diagnosis present

## 2011-11-15 DIAGNOSIS — F17200 Nicotine dependence, unspecified, uncomplicated: Secondary | ICD-10-CM | POA: Diagnosis present

## 2011-11-15 LAB — CBC
HCT: 47.1 % (ref 39.0–52.0)
MCH: 26.7 pg (ref 26.0–34.0)
MCHC: 34.2 g/dL (ref 30.0–36.0)
MCV: 78 fL (ref 78.0–100.0)
Platelets: 244 10*3/uL (ref 150–400)
RDW: 15 % (ref 11.5–15.5)

## 2011-11-15 LAB — RAPID URINE DRUG SCREEN, HOSP PERFORMED
Benzodiazepines: NOT DETECTED
Cocaine: NOT DETECTED
Opiates: NOT DETECTED

## 2011-11-15 LAB — COMPREHENSIVE METABOLIC PANEL
Albumin: 4.3 g/dL (ref 3.5–5.2)
BUN: 7 mg/dL (ref 6–23)
Calcium: 9.2 mg/dL (ref 8.4–10.5)
Creatinine, Ser: 0.74 mg/dL (ref 0.50–1.35)
Total Bilirubin: 0.3 mg/dL (ref 0.3–1.2)
Total Protein: 8.5 g/dL — ABNORMAL HIGH (ref 6.0–8.3)

## 2011-11-15 LAB — ETHANOL: Alcohol, Ethyl (B): 359 mg/dL — ABNORMAL HIGH (ref 0–11)

## 2011-11-15 LAB — ACETAMINOPHEN LEVEL: Acetaminophen (Tylenol), Serum: <15 ug/mL (ref 10–30)

## 2011-11-15 MED ORDER — ALUM & MAG HYDROXIDE-SIMETH 200-200-20 MG/5ML PO SUSP: 30.0000 mL | ORAL | Status: DC | PRN

## 2011-11-15 MED ORDER — NICOTINE 21 MG/24HR TD PT24: 21.0000 mg | MEDICATED_PATCH | Freq: Every day | TRANSDERMAL | Status: DC

## 2011-11-15 MED ORDER — ADULT MULTIVITAMIN W/MINERALS CH: 1.0000 | ORAL_TABLET | Freq: Every day | ORAL | Status: DC

## 2011-11-15 MED ORDER — IBUPROFEN 600 MG PO TABS
600.0000 mg | ORAL_TABLET | Freq: Three times a day (TID) | ORAL | Status: DC | PRN
  Filled 2011-11-15: qty 3
  Filled 2011-11-15: qty 1

## 2011-11-15 MED ORDER — ZOLPIDEM TARTRATE 5 MG PO TABS
5.0000 mg | ORAL_TABLET | Freq: Every evening | ORAL | Status: DC | PRN
  Filled 2011-11-15: qty 1

## 2011-11-15 MED ORDER — VITAMIN B-1 100 MG PO TABS: 100.0000 mg | ORAL_TABLET | Freq: Every day | ORAL | Status: DC

## 2011-11-15 MED ORDER — ACETAMINOPHEN 325 MG PO TABS: 650.0000 mg | ORAL_TABLET | ORAL | Status: DC | PRN

## 2011-11-15 MED ORDER — LORAZEPAM 1 MG PO TABS: 0.0000 mg | ORAL_TABLET | Freq: Two times a day (BID) | ORAL | Status: DC

## 2011-11-15 MED ORDER — LORAZEPAM 2 MG/ML IJ SOLN: 1.0000 mg | Freq: Four times a day (QID) | INTRAMUSCULAR | Status: DC | PRN

## 2011-11-15 MED ORDER — FOLIC ACID 1 MG PO TABS
1.0000 mg | ORAL_TABLET | Freq: Every day | ORAL | Status: DC
Start: 2011-11-16 — End: 2011-11-16

## 2011-11-15 MED ORDER — ONDANSETRON HCL 4 MG PO TABS
4.0000 mg | ORAL_TABLET | Freq: Three times a day (TID) | ORAL | Status: DC | PRN
  Administered 2011-11-16: 4 mg via ORAL
  Filled 2011-11-15: qty 1

## 2011-11-15 MED ORDER — LORAZEPAM 1 MG PO TABS
2.0000 mg | ORAL_TABLET | Freq: Once | ORAL | Status: AC
  Administered 2011-11-15: 2 mg via ORAL
  Filled 2011-11-15: qty 2

## 2011-11-15 MED ORDER — LORAZEPAM 1 MG PO TABS
0.0000 mg | ORAL_TABLET | Freq: Four times a day (QID) | ORAL | Status: DC
  Administered 2011-11-15: 2 mg via ORAL
  Filled 2011-11-15: qty 2

## 2011-11-15 MED ORDER — LORAZEPAM 1 MG PO TABS
1.0000 mg | ORAL_TABLET | Freq: Four times a day (QID) | ORAL | Status: DC | PRN
  Administered 2011-11-16: 1 mg via ORAL
  Filled 2011-11-15: qty 1

## 2011-11-15 MED ORDER — THIAMINE HCL 100 MG/ML IJ SOLN: 100.0000 mg | Freq: Every day | INTRAMUSCULAR | Status: DC

## 2011-11-15 NOTE — ED Notes (Signed)
MD/PA at bedside. 

## 2011-11-15 NOTE — ED Notes (Signed)
MD at bedside. 

## 2011-11-15 NOTE — ED Notes (Signed)
Pt extremely anxious. Distraught about circumstances.  Tearful. Consoled patient as much as possible.  Pt given meal and meds for comfort.

## 2011-11-15 NOTE — ED Provider Notes (Signed)
History     CSN: [id number]  Arrival date & time 11/15/11  1959   First MD Initiated Contact with Patient 11/15/11 2051     10:16 PM HPI Patient reports a history of alcohol abuse. Reports his requesting alcohol detox. Reports his last detox was one week ago reports severe depression but denies suicidal or homicidal ideation. Patient is a 41 y.o. male presenting with alcohol problem. The history is provided by the patient.  Alcohol Problem This is a chronic problem. The problem has been unchanged. Pertinent negatives include no abdominal pain or weakness.    Past Medical History  Diagnosis Date  . Alcohol abuse   . Anxiety   . Depression   . Bipolar 2 disorder   . Active smoker     Past Surgical History  Procedure Date  . Lymph node dissection     Family History  Problem Relation Age of Onset  . Alcohol abuse Father     History  Substance Use Topics  . Smoking status: Current Everyday Smoker -- 2.0 packs/day for 20 years  . Smokeless tobacco: Not on file  . Alcohol Use: 21.6 oz/week    36 Cans of beer per week     heavy       Review of Systems  Gastrointestinal: Negative for abdominal pain.  Neurological: Positive for tremors. Negative for weakness.  Psychiatric/Behavioral: Positive for behavioral problems and agitation. Negative for suicidal ideas and self-injury.  All other systems reviewed and are negative.    Allergies  Review of patient's allergies indicates no known allergies.  Home Medications  No current outpatient prescriptions on file.  BP 134/84  Pulse 115  Temp(Src) 98.1 F (36.7 C) (Oral)  Resp 18  SpO2 98%  Physical Exam  Constitutional: He is oriented to person, place, and time. He appears well-developed and well-nourished.  HENT:  Head: Normocephalic and atraumatic.  Eyes: Pupils are equal, round, and reactive to light.  Neurological: He is alert and oriented to person, place, and time.  Skin: Skin is warm and dry. No rash noted.  No erythema. No pallor.  Psychiatric: He has a normal mood and affect. His behavior is normal.    ED Course  Procedures   MDM   Patient passed to Dr. Nicanor Alcon who will admit the patient for Alcohol withdrawal       Thomasene Lot, PA-C 11/16/11 0111  Thomasene Lot, PA-C 11/16/11 0112

## 2011-11-15 NOTE — ED Notes (Signed)
Pt presented to the ER stating that he needs detox, pt reports having 30 beers a day, states last dose was about 3hr ago, pt presented to be shaking, jittering, able to ambulate but unstable and wobbly.

## 2011-11-15 NOTE — ED Notes (Addendum)
No family at bedside at this time.

## 2011-11-16 ENCOUNTER — Encounter (HOSPITAL_COMMUNITY): Payer: Self-pay | Admitting: Internal Medicine

## 2011-11-16 LAB — BASIC METABOLIC PANEL
CO2: 30 mEq/L (ref 19–32)
Chloride: 100 mEq/L (ref 96–112)
Creatinine, Ser: 0.85 mg/dL (ref 0.50–1.35)
GFR calc Af Amer: >90 mL/min (ref >90)
Potassium: 3.2 mEq/L — ABNORMAL LOW (ref 3.5–5.1)
Sodium: 141 mEq/L (ref 135–145)

## 2011-11-16 LAB — MRSA PCR SCREENING: MRSA by PCR: NEGATIVE

## 2011-11-16 LAB — CBC
HCT: 39.5 % (ref 39.0–52.0)
RBC: 5.03 MIL/uL (ref 4.22–5.81)
RDW: 15.1 % (ref 11.5–15.5)
WBC: 5.4 10*3/uL (ref 4.0–10.5)

## 2011-11-16 MED ORDER — ONDANSETRON HCL 4 MG/2ML IJ SOLN: 4.0000 mg | Freq: Four times a day (QID) | INTRAMUSCULAR | Status: DC | PRN

## 2011-11-16 MED ORDER — LORAZEPAM 1 MG PO TABS: 1.0000 mg | ORAL_TABLET | Freq: Four times a day (QID) | ORAL | Status: DC | PRN

## 2011-11-16 MED ORDER — SODIUM CHLORIDE 0.9 % IJ SOLN
3.0000 mL | Freq: Two times a day (BID) | INTRAMUSCULAR | Status: DC
  Administered 2011-11-16 – 2011-11-18 (×4): 3 mL via INTRAVENOUS

## 2011-11-16 MED ORDER — POTASSIUM CHLORIDE CRYS ER 20 MEQ PO TBCR
20.0000 meq | EXTENDED_RELEASE_TABLET | Freq: Three times a day (TID) | ORAL | Status: DC
  Administered 2011-11-16 (×3): 20 meq via ORAL
  Filled 2011-11-16 (×6): qty 1

## 2011-11-16 MED ORDER — THIAMINE HCL 100 MG/ML IJ SOLN
Freq: Once | INTRAVENOUS | Status: AC
  Administered 2011-11-16: 04:00:00 via INTRAVENOUS
  Filled 2011-11-16: qty 1000

## 2011-11-16 MED ORDER — VITAMIN B-1 100 MG PO TABS
100.0000 mg | ORAL_TABLET | Freq: Every day | ORAL | Status: DC
  Administered 2011-11-16 – 2011-11-18 (×3): 100 mg via ORAL
  Filled 2011-11-16 (×4): qty 1

## 2011-11-16 MED ORDER — FOLIC ACID 1 MG PO TABS
1.0000 mg | ORAL_TABLET | Freq: Every day | ORAL | Status: DC
  Administered 2011-11-16 – 2011-11-18 (×3): 1 mg via ORAL
  Filled 2011-11-16 (×4): qty 1

## 2011-11-16 MED ORDER — LORAZEPAM 1 MG PO TABS
2.0000 mg | ORAL_TABLET | Freq: Four times a day (QID) | ORAL | Status: DC | PRN
  Administered 2011-11-17 – 2011-11-18 (×2): 2 mg via ORAL
  Filled 2011-11-16 (×2): qty 2

## 2011-11-16 MED ORDER — LORAZEPAM 2 MG/ML IJ SOLN: 2.0000 mg | Freq: Four times a day (QID) | INTRAMUSCULAR | Status: DC | PRN

## 2011-11-16 MED ORDER — LORAZEPAM 1 MG PO TABS
0.0000 mg | ORAL_TABLET | Freq: Two times a day (BID) | ORAL | Status: DC
  Administered 2011-11-17: 2 mg via ORAL
  Administered 2011-11-18: 1 mg via ORAL
  Filled 2011-11-16: qty 1

## 2011-11-16 MED ORDER — ACETAMINOPHEN 325 MG PO TABS
650.0000 mg | ORAL_TABLET | Freq: Four times a day (QID) | ORAL | Status: DC | PRN
  Administered 2011-11-16 – 2011-11-17 (×2): 650 mg via ORAL
  Filled 2011-11-16 (×2): qty 2

## 2011-11-16 MED ORDER — ACETAMINOPHEN 650 MG RE SUPP: 650.0000 mg | Freq: Four times a day (QID) | RECTAL | Status: DC | PRN

## 2011-11-16 MED ORDER — LORAZEPAM 2 MG/ML IJ SOLN
2.0000 mg | Freq: Four times a day (QID) | INTRAMUSCULAR | Status: DC | PRN
  Administered 2011-11-16 – 2011-11-17 (×4): 2 mg via INTRAVENOUS
  Filled 2011-11-16 (×4): qty 1

## 2011-11-16 MED ORDER — PANTOPRAZOLE SODIUM 40 MG PO TBEC
40.0000 mg | DELAYED_RELEASE_TABLET | Freq: Every day | ORAL | Status: DC
  Administered 2011-11-16 – 2011-11-18 (×3): 40 mg via ORAL
  Filled 2011-11-16 (×3): qty 1

## 2011-11-16 MED ORDER — PANTOPRAZOLE SODIUM 40 MG IV SOLR
40.0000 mg | Freq: Every day | INTRAVENOUS | Status: DC
  Administered 2011-11-16: 40 mg via INTRAVENOUS
  Filled 2011-11-16 (×2): qty 40

## 2011-11-16 MED ORDER — ZOLPIDEM TARTRATE 5 MG PO TABS
5.0000 mg | ORAL_TABLET | Freq: Once | ORAL | Status: AC
  Administered 2011-11-16: 5 mg via ORAL
  Filled 2011-11-16: qty 1

## 2011-11-16 MED ORDER — THIAMINE HCL 100 MG/ML IJ SOLN
100.0000 mg | Freq: Every day | INTRAMUSCULAR | Status: DC
  Filled 2011-11-16 (×4): qty 1

## 2011-11-16 MED ORDER — ADULT MULTIVITAMIN W/MINERALS CH
1.0000 | ORAL_TABLET | Freq: Every day | ORAL | Status: DC
  Administered 2011-11-16 – 2011-11-18 (×3): 1 via ORAL
  Filled 2011-11-16 (×4): qty 1

## 2011-11-16 MED ORDER — ONDANSETRON HCL 4 MG PO TABS
4.0000 mg | ORAL_TABLET | Freq: Four times a day (QID) | ORAL | Status: DC | PRN
  Administered 2011-11-16 – 2011-11-17 (×2): 4 mg via ORAL
  Filled 2011-11-16 (×2): qty 1

## 2011-11-16 MED ORDER — LORAZEPAM 1 MG PO TABS
0.0000 mg | ORAL_TABLET | Freq: Four times a day (QID) | ORAL | Status: AC
  Administered 2011-11-16 (×3): 2 mg via ORAL
  Administered 2011-11-16: 4 mg via ORAL
  Administered 2011-11-17: 3 mg via ORAL
  Administered 2011-11-18: 2 mg via ORAL
  Filled 2011-11-16: qty 4
  Filled 2011-11-16: qty 2
  Filled 2011-11-16: qty 4
  Filled 2011-11-16 (×2): qty 2
  Filled 2011-11-16: qty 3
  Filled 2011-11-16: qty 2

## 2011-11-16 MED ORDER — LORAZEPAM 2 MG/ML IJ SOLN
1.0000 mg | Freq: Four times a day (QID) | INTRAMUSCULAR | Status: DC | PRN
  Administered 2011-11-16: 1 mg via INTRAVENOUS
  Filled 2011-11-16: qty 1

## 2011-11-16 NOTE — H&P (Signed)
PCP:   [name], MD, MD   Chief Complaint:  Alcohol withdrawal  HPI: 41yoM with h/o alcohol dependence.   Pt was last admitted to [facility] 1/20-1/23 for alcohol withdrawal, was seen by psych  and recommended starting Depakote, and was stable for d/c home. He was then seen  again in ED 2/3 for alcohol withdrawal, and admitted again 2/3 for the same. They  note he had not been taking Depakote. He left against medical advice to "take care  of some business" on 2/4.   He now presents with alcohol withdrawal symptoms, tremulousness, and requesting  detox. In discussion with pt, has fairly good insight into his behavior and  expresses that he can't keep doing this to himself. There are also significant  social stressors -- his wife divorced him and took his 28 yo son, who he now cannot  see, and she also apparently took quite a lot of his money. 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  Finally, he states he was in the [employer] and was in [place], [place] during  combat action, for which he possibly has PTSD symptoms.   However, he states he's had periods of total abstinence and sobriety for 9 months  at a time, but recently started drinking again -- up to 2 cases of beer a day. He  denies hard liquor, but endorses occasional marijuana, but no other drugs. He  denies h/o seizures.   In the ED, pt was tachycardic to 115, preserved BP, afebrile. Labs with HCO3 33  otherwise unremarkable chemistry panel. LFT's normal. CBC normal. Tylenol negative.  Alcohol level 359, UTox negative.   ROS also with some abdominal pain, but otherwise negative.   Past Medical History  Diagnosis Date  . Alcohol abuse   . Anxiety   . Depression   . Bipolar 2 disorder   . Active smoker     Past Surgical History  Procedure Date  . Lymph node dissection     Medications:  HOME MEDS: Not taking anything Prior to Admission medications   Not on File    Allergies:  No Known Allergies  Social History:   reports that he has been smoking.  He does not have any smokeless tobacco history on file. He reports that he drinks about 21.6 ounces of alcohol per week. He reports that he uses illicit drugs (Marijuana). Has an ex-wife and 31 yo son whom he cannot see now, as his ex-wife divorced him. Was in the [employer] and went to [place], [place], and may have some PTSD symptoms from this. Was also a Management consultant. Heavy alcohol abuse during binges, but has had periods of sobriety for months on end. Has been drinking up to two cases of beer a day  Family History: Family History  Problem Relation Age of Onset  . Alcohol abuse Father    Physical Exam: Filed Vitals:   11/15/11 2044 11/15/11 2310 11/15/11 2315  BP: 134/84 143/89 143/89  Pulse: 115 111 111  Temp: 98.1 F (36.7 C) 98.6 F (37 C)   TempSrc: Oral Oral   Resp: 18 18   SpO2: 98% 98%    Blood pressure 143/89, pulse 111, temperature 98.6 F (37 C), temperature source Oral, resp. rate 18, SpO2 98.00%.  Gen: Young appearing, pleasant, currently calm and conversant, able to relate  history well, minimally tremulous at present. No distress HEENT: Pupils round, reactive, normal, EOMI, sclera clear, no icterus. Mouth a bit  dry but normal appearing.  Lungs: CTAB no w/c/r, normal exam, good air movement Heart: Regular, not tachycardic, no m/g Abd: Soft, scaphoid, NT ND benign overall Extrem: Warm, thin, normal tone, radials palpable, normal exam, no BLE edema Neuro: Alert, attentive CN 2-12 intact, moves extremities on his own, grossly non- focal Psych: Linear thought process, expresses himself well, but expressing guilt and  remorse, has decent insight into his problems.   Labs & Imaging Results for orders placed during the hospital encounter of 11/15/11 (from the past 48 hour(s))    URINE RAPID DRUG SCREEN (HOSP PERFORMED)     Status: Normal   Collection Time   11/15/11  9:59 PM      Component Value Range Comment   Opiates NONE DETECTED  NONE DETECTED     Cocaine NONE DETECTED  NONE DETECTED     Benzodiazepines NONE DETECTED  NONE DETECTED     Amphetamines NONE DETECTED  NONE DETECTED     Tetrahydrocannabinol NONE DETECTED  NONE DETECTED     Barbiturates NONE DETECTED  NONE DETECTED    CBC     Status: Abnormal   Collection Time   11/15/11 10:01 PM      Component Value Range Comment   WBC 5.5  4.0 - 10.5 (K/uL)    RBC 6.04 (*) 4.22 - 5.81 (MIL/uL)    Hemoglobin 16.1  13.0 - 17.0 (g/dL)    HCT 16.1  09.6 - 04.5 (%)    MCV 78.0  78.0 - 100.0 (fL)    MCH 26.7  26.0 - 34.0 (pg)    MCHC 34.2  30.0 - 36.0 (g/dL)    RDW 40.9  81.1 - 91.4 (%)    Platelets 244  150 - 400 (K/uL)   COMPREHENSIVE METABOLIC PANEL     Status: Abnormal   Collection Time   11/15/11 10:01 PM      Component Value Range Comment   Sodium 145  135 - 145 (mEq/L)    Potassium 3.6  3.5 - 5.1 (mEq/L)    Chloride 103  96 - 112 (mEq/L)    CO2 33 (*) 19 - 32 (mEq/L)    Glucose, Bld 101 (*) 70 - 99 (mg/dL)    BUN 7  6 - 23 (mg/dL)    Creatinine, Ser 7.82  0.50 - 1.35 (mg/dL)    Calcium 9.2  8.4 - 10.5 (mg/dL)    Total Protein 8.5 (*) 6.0 - 8.3 (g/dL)    Albumin 4.3  3.5 - 5.2 (g/dL)    AST 38 (*) 0 - 37 (U/L)    ALT 32  0 - 53 (U/L)    Alkaline Phosphatase 76  39 - 117 (U/L)    Total Bilirubin 0.3  0.3 - 1.2 (mg/dL)    GFR calc non Af Amer >90  >90 (mL/min)    GFR calc Af Amer >90  >90 (mL/min)   ETHANOL     Status: Abnormal   Collection Time   11/15/11 10:01 PM      Component Value Range Comment   Alcohol, Ethyl (B) 359 (*) 0 - 11 (mg/dL)   ACETAMINOPHEN LEVEL     Status: Normal   Collection Time   11/15/11 10:01 PM      Component Value Range Comment   Acetaminophen (Tylenol), Serum <15.0  10 - 30 (ug/mL)    No  results found.  Impression Present on Admission:  .Alcohol withdrawal  syndrome .Depression .Alcohol abuse  41yoM with h/o alcohol dependence presents with alcohol withdrawal.   1. Alcohol withdrawal: Has good insight into his alcoholism and periods of extended  sobriety and having a job in the past are good predictors of future success if he  can stop drinking. There is significant social stressors and psychiatric  comorbidities -- depression and likely some element of PTSD. Hopefully SW/psych can  provide him with some resources to get plugged into therapy or to see a  psychiatrist regularly.   He doesn't like taking meds, that's why he quit Seroquel, Lamictal, and most  recently Depakote.   - CIWA scale, will give banana bag, then PO folate, MV, thiamine. PPI for  gastritis. SW consultation. Consider Psych consult in the am.   SDU, admit to [facility]  Presumed full code.   Other plans as per orders.  Shakeita Vandevander 11/16/2011, 2:42 AM

## 2011-11-16 NOTE — ED Provider Notes (Signed)
Medical screening examination/treatment/procedure(s) were performed by non-physician practitioner and as supervising physician I was immediately available for consultation/collaboration.  Loren Racer, MD 11/16/11 [phone]

## 2011-11-16 NOTE — Progress Notes (Signed)
eLink Physician-Brief Progress Note Patient Name: [name] DOB: 1970-10-25 MRN: [id number]  Date of Service  11/16/2011   HPI/Events of Note   Best practice  eICU Interventions  SCDs ordered   Intervention Category Intermediate Interventions: Best-practice therapies (e.g. DVT, beta blocker, etc.)  DETERDING,ELIZABETH 11/16/2011, 6:35 AM

## 2011-11-16 NOTE — Progress Notes (Signed)
Patient CIWA 23.  Patient restless, agitated, picking at skin, visible tremors.  Tearful at times.  C/o headache 4/10.  Asking "who is in the bathroom". (No one else is in the room with the patient).  MD notified.  New orders received, will continue to monitor.

## 2011-11-17 LAB — BASIC METABOLIC PANEL
BUN: 9 mg/dL (ref 6–23)
Calcium: 9.4 mg/dL (ref 8.4–10.5)
GFR calc non Af Amer: >90 mL/min (ref >90)
Glucose, Bld: 87 mg/dL (ref 70–99)

## 2011-11-17 MED ORDER — ZOLPIDEM TARTRATE 5 MG PO TABS
5.0000 mg | ORAL_TABLET | ORAL | Status: DC
  Administered 2011-11-17: 5 mg via ORAL
  Filled 2011-11-17: qty 1

## 2011-11-17 MED ORDER — POTASSIUM CHLORIDE CRYS ER 20 MEQ PO TBCR
20.0000 meq | EXTENDED_RELEASE_TABLET | Freq: Two times a day (BID) | ORAL | Status: DC
  Administered 2011-11-17 – 2011-11-18 (×3): 20 meq via ORAL
  Filled 2011-11-17 (×6): qty 1

## 2011-11-17 NOTE — Progress Notes (Signed)
Spoke with Judeth Cornfield from SW about pt's d/c plans, possible [facility] admission, Pt requesting [facility] for rehab treatment.  SW aware, Judeth Cornfield to let pt's SW Marchelle Folks know to came talk with pt regarding d/c plans.

## 2011-11-17 NOTE — Progress Notes (Signed)
Subjective: Feels much better  Objective: Vital signs in last 24 hours: Temp:  [97.5 F (36.4 C)-98.7 F (37.1 C)] 97.5 F (36.4 C) (02/11 0400) Pulse Rate:  [64-102] 66  (02/11 0600) Resp:  [13-26] 15  (02/11 0600) BP: (107-127)/(56-90) 107/73 mmHg (02/11 0600) SpO2:  [92 %-98 %] 96 % (02/11 0600) Weight change:     Intake/Output from previous day: 02/10 0701 - 02/11 0700 In: 1920 [P.O.:1920] Out: 3800 [Urine:3800] Intake/Output this shift: Total I/O In: 480 [P.O.:480] Out: 1000 [Urine:1000]  General appearance: alert and cooperative Chest wall: no tenderness lungs clear Cardio: regular rate and rhythm, S1, S2 normal, no murmur, click, rub or gallop  Lab Results:  Basename 11/16/11 0512 11/15/11 2201  WBC 5.4 5.5  HGB 13.2 16.1  HCT 39.5 47.1  PLT 177 244   BMET  Basename 11/17/11 0316 11/16/11 0512  NA 143 141  K 4.0 3.2*  CL 103 100  CO2 32 30  GLUCOSE 87 85  BUN 9 10  CREATININE 0.79 0.85  CALCIUM 9.4 8.7    Studies/Results: No results found.  Medications: I have reviewed the patient's current medications.  Assessment/Plan: Principal Problem:  *Alcohol withdrawal syndrome  Improved.  Continue WD protocol Active Problems:  Alcohol abuse SW consult.  He claims he is entering alcohol rehab next week  Depression/bipolar disorder  Psych to see today   LOS: 2 days   [name] 11/17/2011, 6:54 AM

## 2011-11-17 NOTE — Progress Notes (Signed)
Spoke with Marchelle Folks from SW, she said that psych consult has not been done for pt yet.  Called and spoke with Dr. Valentina Lucks about pt's psych consult, he is going to call in this am.

## 2011-11-17 NOTE — Progress Notes (Signed)
Spiritual Care Note:  Pt requested that a Chaplain be paged at or about 1500, but no page was received by the daytime chaplain until our transition at 1700. Chaplains decided for nighttime Chaplain to respond to the 1715 page. This was explain to the pt and he responded he understood that sometimes pages fail to go through.  Pt is a recovering alcoholic coming out of the shakes stage of his addiction. He is very intelligent and is capable of assisting in his own care. He has been admitted to the hospital for his addiction in the past and has been treated in [facility] previously. In short he has been through this before and dealing with his treatment this time requires dealing with an intellectual pt who knows where his life is endangered and what he should do. The pt said that he has walked out of treatment in the past when it appeared the staff was assisting him get away from one addiction by starting him on another addiction, which he felt was behavioral medications. His spiritual pain is real and has been dealt with by straight talk about where he is and where he needs to be. One barrier to this is that the 12 step program he was attending assigned him a buddy who really does not want to be his buddy. He says that he was referred around so much that he ended up not calling his buddy again, which set him up for being alone with his addictions. Because of his addictions he has lost his wife's care, his children's affection and his work situation. He is not at the bottom, but he can see it from where he is currently.  Pt feels that if he is allowed to walk more, now that he is more stable, that he can burn off some of his pent up stress.  For Medical and Behavioral Staff: My recommendation is that a hard look at pt being assigned to Behavioral Health be made. This course may lead to a pt feeling of greater desperation rather than a conduit of betterment. Pt needs counsel, encouragement, advocate care,  and truth telling. His spiritual side is holding him together presently and to separate him from such care while being assisted by Medical and Behavioral staff would be shortsighted. Consults for holistic care is needed. The day time chaplains will be requested for follow-up.  For Chaplains: Pt is [name] with a [employer] background. He presently is mediating on Psalm 23 and feels this is his guide to spiritual recovery.  Pt speaks very highly of the staff on [address] [place] and praises those that have provided care to him. He is in the midst of painful transitions from his addiction to his recovery and staff care has meant a great deal to his beginning recovery.pt   Nighttime Chaplain should be paged tonight in the event of another crisis situation.   [name] Nighttime Chaplain On Call Pager [phone]

## 2011-11-17 NOTE — Progress Notes (Signed)
CSW received a call from nurse, Sue Lush. CSW was informed that patient is waiting to see a Child psychotherapist. CSW informed patient care nurse that this CSW is not the CSW for this patient. CSW was told that nurse has called CSW Marchelle Folks, [place] CSW and she has not answered phone. CSW informed patient care nurse that she may be with another patient but that if she leaves Marchelle Folks a message she will likely call back and that this CSW will also relay the information to her. CSW informed patient care nurse that the CSW for the patients physician is Joquita and early this morning CSW gave nurse that number. CSW communicated same to Psych Child psychotherapist, Providence Crosby. Thomes Burak C. Alexey Rhoads MSW, LCSW [phone]

## 2011-11-18 DIAGNOSIS — F1010 Alcohol abuse, uncomplicated: Secondary | ICD-10-CM

## 2011-11-18 NOTE — Progress Notes (Signed)
LM for Pt's friend, Lillia Abed, at [phone].  Providence Crosby, LCSWA Clinical Social Work [phone]

## 2011-11-18 NOTE — Consult Note (Addendum)
Reason for Consult  Alcohol Withdrawal Referring Physician: Dr.Griffin [name] is an 41 y.o. male.  HPI: Spoke with Pt regarding this admission Pt denies any illegal drug use. Pt does smoke.  Pt reports that he's been drinking for 25yrs. He reports that he binge drinks, starting with a 12-pack and eventually consuming up to 40 beers in one sitting.  Pt reports that he's been to detox 4x in the past 2-3wks. He states that he was sober from August 2012 until 2wks ago. He attributes his relapse to hanging out with the wrong people.  Pt reports that he's been in numerous inpt and outpt tx facilities and that he's attended AA faithfully during recovery periods. Pt reports that AA works for him and that he has a strong AA support system.  Pt reports that he used to be a Surveyor, minerals but that he's been out of work for approx 1 year[ as a [employer] homes] due to the economy. He states that he and his business partner have known one another for many years and that he's realizing that he's going to have to distance himself from this person if he wants to get better. Pt reports that this realization, and the separation from his wife of 18 years for the past year, has contributed to his depression.  Pt has the weight of possibly losing his belongings, car and apartment on his shoulders.  Pt denies current or hx of sucidality but admits to periods of time of not wanting to live. Pt states that he has a 52 year old son to whom he's devoted. Pt reports that his son is not currently talking to him due to his recent relapse but that he hopes that his son will "bounce back" and understand that he wants help.  Pt has an appt at [facility] for admission into their 28-day program on the 18th. He will have the option for remaining in their program for 90-days. Pt wanting to d/c home to tie up loose ends and collect his things prior to admission into [facility]. Pt intends to physically be with an AA support person,  particularly his sponsor, daily until he's admitted into [facility]. He says he has a list of AA friends who will allow him  To sleep on sofas until Mon. Feb 18th admission to [facility].   Additionally, Pt intends to attend AA meetings daily until his admission into [facility].  Pt reports that he's been Rx'd Xanax recently but that flushed them. He not Rx'd any other psychotropic med. He reports he had a bad reaction to one but cannot recall the name.  He does recall Lithium and Lamictal  Pt attempted to contact a friend, Lillia Abed, at [phone]   Pt provided CSW with permission who will  contact Lillia Abed at a later time.  Pt emphatically states that he needs to be d/c'd home prior to his [facility] admission. He states that he will be better able to focus on his tx if he's allowed to get his affairs in order. Otherwise, he states that he will spend much of his tx time worrying about the status of his belongings, car and his apt.    AXIS I  Depression due to medical condition Alcohol Dependence, Bipolar I, Tobacco DependenceI  AXIS II  Deferred AXIS III  Past Medical History  Diagnosis Date  . Alcohol abuse   . Anxiety   . Depression   . Bipolar 2 disorder   . Active smoker     Past Surgical  History  Procedure Date  . Lymph node dissection   AXIS IV  Parenting issues, finances, employment, family conflicts impulse control, poor adherence to medication treatment AXIS V  GAF  50  Family History  Problem Relation Age of Onset  . Alcohol abuse Father     Social History:  reports that he has been smoking.  He does not have any smokeless tobacco history on file. He reports that he drinks about 151.2 ounces of alcohol per week. He reports that he uses illicit drugs (Marijuana).  Allergies: No Known Allergies  Medications:I have reviewed patient's current medications  Results for orders placed during the hospital encounter of 11/15/11 (from the past 48 hour(s))  BASIC METABOLIC PANEL     Status:  Normal   Collection Time   11/17/11  3:16 AM      Component Value Range Comment   Sodium 143  135 - 145 (mEq/L)    Potassium 4.0  3.5 - 5.1 (mEq/L)    Chloride 103  96 - 112 (mEq/L)    CO2 32  19 - 32 (mEq/L)    Glucose, Bld 87  70 - 99 (mg/dL)    BUN 9  6 - 23 (mg/dL)    Creatinine, Ser 4.09  0.50 - 1.35 (mg/dL)    Calcium 9.4  8.4 - 10.5 (mg/dL)    GFR calc non Af Amer >90  >90 (mL/min)    GFR calc Af Amer >90  >90 (mL/min)     No results found.  Review of Systems  Unable to perform ROS: other   Blood pressure 104/63, pulse 118, temperature 97.8 F (36.6 C), temperature source Oral, resp. rate 17, height 5\' 10"  (1.778 m), weight 70.1 kg (154 lb 8.7 oz), SpO2 97.00%. Physical Exam  Assessment/Plan:  Discussed with Dr. Valentina Lucks, Pt is interviewed with Psych CSW Providence Crosby Pt reviews his plans for post-discharge treatment.  Former treatments were only a few days.  [facility] will give him a longer rehab program.  He has full complement of sponsors and has plans to stay with them and plans to attend multiple AA programs daily. Marland Kitchen  He has realized his last relapse after an effective rehab was a disregard of his counselors warning to NOT try to see his son.  When he did, he decompensated and began drinking again.  He needs to be transferred to outpatient services when he completes his rehab program.  He has decided with this interview that he will NOT try to see his son who refuses to talk with him. This is a significant trigger to binge drinking.  Pt is bearded, calm and has spontaneous talk.  His affect is labile as he describes loss of work with friend, loss of contact with his son and financial, tangible losses of car etc.  He denies SI/HI.  He denies AH VH.  He is cognitively intact and speaks with evidence of executive functioning.  He has some insight.  His judgment is to be tested for this specific plan he has in place. He is future oriented.   It is assessed that a few days as inpatient  in [facility] or similar facility would not serve him well as the long-term rehab program.  RECOMMENDATION: 1.  When medically stable, discharge with notification of friends in AA 2.  Consider mood stabilizing medication Depakote 250 mg BID, starting dose 3.  Consider anxiolytic/antidepressant: Zoloft 50 mg OR Lexapro 20 mg 4.  Consider Vistaril 50 mg 3 X daily as  needed for anxiety 5.  Consider the lethal risk of potential mix of Benzodiazepines and Alcohol; minimize or delete Ativan and 'cousins' 6.  Consider ambien 10 mg for sleep 7.  Support referral to [facility]  When medically stable; Suicide risk assessment, based on today's interview, minimal 8.  No further psychiatric needs identified.  MD Psychiatrist signs off  Wael Maestas 11/18/2011, 4:03 PM

## 2011-11-18 NOTE — Progress Notes (Signed)
Met with Pt and psych MD.  Pt explained that he's made several calls to various AA members and that he has couches lined up on which for him to sleep.  Pt told CSW and psych MD that he intends to attend up to 4 AA meetings per day and was anxious to d/c so that he could attend a 1700 AA meeting.  Pt asked CSW not to speak with Lillia Abed, as he wanted to keep things between his AA contacts, psych MD, MD and himself.  Psych MD completed her eval.  Recommendations to follow.  Providence Crosby, LCSWA Clinical Social Work [phone]

## 2011-11-18 NOTE — Progress Notes (Signed)
Subjective: Feels better.  Would like to be transferred to [facility] prior to admission at alcohol rehab program next week  Objective: Vital signs in last 24 hours: Temp:  [97.7 F (36.5 C)-98.6 F (37 C)] 98.4 F (36.9 C) (02/11 2125) Pulse Rate:  [57-90] 63  (02/12 0600) Resp:  [12-20] 13  (02/12 0600) BP: (96-130)/(61-70) 96/63 mmHg (02/12 0325) SpO2:  [95 %-98 %] 96 % (02/12 0600) Weight change:  Last BM Date: 11/09/11  Intake/Output from previous day: 02/11 0701 - 02/12 0700 In: -  Out: 1825 [Urine:1825] Intake/Output this shift:    Resp: clear to auscultation bilaterally Cardio: regular rate and rhythm, S1, S2 normal, no murmur, click, rub or gallop GI: soft, non-tender; bowel sounds normal; no masses,  no organomegaly  Lab Results:  [facility] 11/16/11 0512 11/15/11 2201  WBC 5.4 5.5  HGB 13.2 16.1  HCT 39.5 47.1  PLT 177 244   BMET  Basename 11/17/11 0316 11/16/11 0512  NA 143 141  K 4.0 3.2*  CL 103 100  CO2 32 30  GLUCOSE 87 85  BUN 9 10  CREATININE 0.79 0.85  CALCIUM 9.4 8.7    Studies/Results: No results found.  Medications: I have reviewed the patient's current medications.  Assessment/Plan: Principal Problem:  *Alcohol withdrawal syndrome  Improved on alcohol WD protocol Active Problems:  Alcohol abuse  SW to see pt.  Depression  Awaiting recommendations from psych, ?Transfer to [facility] , pt tells states he has made arrangement to enter inpt etoh rehab program on 2/18   LOS: 3 days   Carlyle Mcelrath JOSEPH 11/18/2011, 7:32 AM

## 2011-11-18 NOTE — Progress Notes (Signed)
Spoke with Pt at length regarding current admission.  Pt denies any illegal drug use.  Pt does smoke.  Pt reports that he's been drinking for 51yrs.  He reports that he binge drinks, starting with a 12-pack and eventually consuming up to 40 beers in one sitting.    Pt reports that he's been to detox 4x in the past 2-3wks.  He states that he was sober from August 2012 until 2wks ago.  He attributes his relapse to hanging out with the wrong people.  Pt reports that he's been in numerous inpt and outpt tx facilities and that he's attended AA faithfully during recovery periods.  Pt reports that AA works for him and that he has a strong AA support system.  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  He states that he and his business partner have known one another for many years and that he's realizing that he's going to have to distance himself from this person if he wants to get better.  Pt reports that this realization, and the separation from his wife of 18 years for the past year, has contributed to his depression.  Additionally, Pt has the weight of possibly losing his belongings, car and apartment on his shoulders.  Pt denies current or hx of sucidality but admits to periods of time of not wanting to live.  Pt states that he has a 16 year old son to whom he's devoted.  Pt reports that his son is not currently talking to him due to his recent relapse but that he hopes that his son will "bounce back" and understand that he wants help.  Pt has an appt at [facility] for admission into their 28-day program on the 18th.  He will have the option for remaining in their program for 90-days.  Pt wanting to d/c home to tie up loose ends, see his son and collect his things prior to admission into [facility].  Pt intends to physically be with an AA support person, particularly his sponsor, daily until he's admitted into [facility].  Additionally, Pt  intends to attend AA meetings daily until his admission into [facility].  Pt reports that he's been Rx'd Xanax recently but that flushed them.  He not Rx'd any other psychotropic med.  Pt attempted to contact a friend, Lillia Abed, at [phone] for CSW to speak with, however this friend was at work.  Pt provided CSW with permission to contact [place] at a later time.  Pt emphatically states that he needs to be d/c'd home prior to his [facility] admission.  He states that he will be better able to focus on his tx if he's allowed to get his affairs in order.  Otherwise, he states that he will spend much of his tx time worrying about the status of his belongings, car and Apt.  Psych MD to see Pt this afternoon.  Providence Crosby, LCSWA Clinical Social Work [phone]

## 2011-11-18 NOTE — Discharge Summary (Signed)
Physician Discharge Summary  Patient ID: [name] MRN: [id number] DOB/AGE: 14-Jun-1971 41 y.o.  Admit date: 11/15/2011 Discharge date: 11/18/2011  Admission Diagnoses:alcohol withdrawal syndrome Alcoholism Depression  Discharge Diagnoses:  Principal Problem:  *Alcohol withdrawal syndrome Active Problems:  Alcohol abuse  Depression   Discharged Condition: fair  Hospital Course: the patient was admitted at his request for alcohol withdrawal syndrome. His lab work and x-ray were unremarkable other than some mild hypokalemia which was corrected. He was given IV fluids and treated with lorazepam as per the alcohol protocol. Within 2 days his alcohol withdrawal syndrome which much improved with less agitation and tremulousness. He was seen by psychiatry and the social worker. The psychiatrist made some suggestions for medical therapy of his depression and possible bipolar disorder. He has obtained admission to [facility] inpatient alcohol rehabilitation facility starting tomorrow as per RN report. I will defer treatment of his psychiatric issues to this facility.  Diet: Regular diet Activity: As tolerated CODE STATUS: Full code  Consults: psychiatry  Significant Diagnostic Studies: labs:  and radiology: CXR: normal  Treatments: IV hydration and sedatives, vitamins  Discharge Exam: Blood pressure 111/72, pulse 130, temperature 97.8 F (36.6 C), temperature source Oral, resp. rate 14, height 5\' 10"  (1.778 m), weight 70.1 kg (154 lb 8.7 oz), SpO2 97.00%. General appearance: alert and cooperative Resp: clear to auscultation bilaterally Cardio: regular rate and rhythm, S1, S2 normal, no murmur, click, rub or gallop  Disposition  Medication List    Notice       You have not been prescribed any medications.            Follow-up Information    Follow up with [name], MD in 6 weeks.         SignedLillia Mountain 11/18/2011, 5:49 PM

## 2011-11-19 NOTE — Discharge Summary (Signed)
Physician Discharge Summary  Patient ID: [name] MRN: [id number] DOB/AGE: Mar 23, 1971 41 y.o.  Admit date: 11/09/2011 Discharge date: 11/19/2011  Admission Diagnoses:alcohol withdrawal syndrome Alcohol abuse Depression Bipolar disorder  Discharge Diagnoses:  Principal Problem:  *Alcohol withdrawal syndrome Active Problems:  Alcohol abuse  Depression   Discharged Condition: good  Hospital Course: the patient was admitted at his request for alcohol withdrawal syndrome. His lab work and chest x-ray were unremarkable. He was treated with the alcohol withdrawal protocol including lorazepam as needed, IV fluids and vitamin supplementation.after one day of an inpatient he was lucid, alert and oriented mild tremulousness. Psychiatry was consulted. Before he could be seen by psychiatry were social work the patient left the hospital against medical device  Consults: psychiatry  Significant Diagnostic Studies: labs: see chart  Treatments: IV hydration and alcohol withdrawal protocol including benzodiazepines  Discharge Exam: Blood pressure 130/77, pulse 93, temperature 97.8 F (36.6 C), temperature source Oral, resp. rate 24, height 5\' 10"  (1.778 m), weight 73 kg (160 lb 15 oz), SpO2 96.00%. Resp: clear to auscultation bilaterally Cardio: regular rate and rhythm, S1, S2 normal, no murmur, click, rub or gallop  Disposition: Home or Self Care   Medication List  As of 11/19/2011  3:13 PM   STOP taking these medications         LORazepam 1 MG tablet           Follow-up Information    Follow up with [name], MD .         SignedLillia Mountain 11/19/2011, 3:13 PM

## 2013-02-08 ENCOUNTER — Encounter (HOSPITAL_COMMUNITY): Payer: Self-pay | Admitting: *Deleted

## 2013-02-08 ENCOUNTER — Inpatient Hospital Stay (HOSPITAL_COMMUNITY)
Admission: EM | Admit: 2013-02-08 | Discharge: 2013-02-12 | DRG: 897 | Disposition: A | Discharge status: Home / Self Care | Payer: MEDICAID | Attending: Internal Medicine | Admitting: Internal Medicine

## 2013-02-08 DIAGNOSIS — F329 Major depressive disorder, single episode, unspecified: Secondary | ICD-10-CM

## 2013-02-08 DIAGNOSIS — F1994 Other psychoactive substance use, unspecified with psychoactive substance-induced mood disorder: Secondary | ICD-10-CM | POA: Diagnosis present

## 2013-02-08 DIAGNOSIS — F411 Generalized anxiety disorder: Secondary | ICD-10-CM | POA: Diagnosis present

## 2013-02-08 DIAGNOSIS — F10929 Alcohol use, unspecified with intoxication, unspecified: Secondary | ICD-10-CM

## 2013-02-08 DIAGNOSIS — R45851 Suicidal ideations: Secondary | ICD-10-CM

## 2013-02-08 DIAGNOSIS — F32A Depression, unspecified: Secondary | ICD-10-CM | POA: Diagnosis present

## 2013-02-08 DIAGNOSIS — F3189 Other bipolar disorder: Secondary | ICD-10-CM | POA: Diagnosis present

## 2013-02-08 DIAGNOSIS — G479 Sleep disorder, unspecified: Secondary | ICD-10-CM | POA: Diagnosis present

## 2013-02-08 DIAGNOSIS — F17200 Nicotine dependence, unspecified, uncomplicated: Secondary | ICD-10-CM | POA: Diagnosis present

## 2013-02-08 DIAGNOSIS — F10939 Alcohol use, unspecified with withdrawal, unspecified: Principal | ICD-10-CM | POA: Diagnosis present

## 2013-02-08 DIAGNOSIS — F930 Separation anxiety disorder of childhood: Secondary | ICD-10-CM | POA: Diagnosis present

## 2013-02-08 DIAGNOSIS — F10229 Alcohol dependence with intoxication, unspecified: Secondary | ICD-10-CM | POA: Diagnosis present

## 2013-02-08 DIAGNOSIS — F10239 Alcohol dependence with withdrawal, unspecified: Secondary | ICD-10-CM | POA: Diagnosis present

## 2013-02-08 DIAGNOSIS — F1010 Alcohol abuse, uncomplicated: Secondary | ICD-10-CM | POA: Diagnosis present

## 2013-02-08 LAB — RAPID URINE DRUG SCREEN, HOSP PERFORMED
Amphetamines: NOT DETECTED
Barbiturates: NOT DETECTED
Benzodiazepines: NOT DETECTED
Cocaine: NOT DETECTED
Opiates: NOT DETECTED
Tetrahydrocannabinol: NOT DETECTED

## 2013-02-08 LAB — COMPREHENSIVE METABOLIC PANEL
ALT: 57 U/L — ABNORMAL HIGH (ref 0–53)
AST: 67 U/L — ABNORMAL HIGH (ref 0–37)
Albumin: 4.2 g/dL (ref 3.5–5.2)
Alkaline Phosphatase: 69 U/L (ref 39–117)
BUN: 7 mg/dL (ref 6–23)
CO2: 26 mEq/L (ref 19–32)
Calcium: 8.9 mg/dL (ref 8.4–10.5)
Chloride: 103 mEq/L (ref 96–112)
Creatinine, Ser: 0.63 mg/dL (ref 0.50–1.35)
GFR calc Af Amer: >90 mL/min (ref >90)
GFR calc non Af Amer: >90 mL/min (ref >90)
Glucose, Bld: 106 mg/dL — ABNORMAL HIGH (ref 70–99)
Potassium: 3.7 mEq/L (ref 3.5–5.1)
Sodium: 143 mEq/L (ref 135–145)
Total Bilirubin: 0.3 mg/dL (ref 0.3–1.2)
Total Protein: 8.4 g/dL — ABNORMAL HIGH (ref 6.0–8.3)

## 2013-02-08 LAB — CBC
HCT: 45.9 % (ref 39.0–52.0)
Hemoglobin: 16.1 g/dL (ref 13.0–17.0)
MCH: 27.1 pg (ref 26.0–34.0)
MCHC: 35.1 g/dL (ref 30.0–36.0)
MCV: 77.1 fL — ABNORMAL LOW (ref 78.0–100.0)
Platelets: 189 10*3/uL (ref 150–400)
RBC: 5.95 MIL/uL — ABNORMAL HIGH (ref 4.22–5.81)
RDW: 13.8 % (ref 11.5–15.5)
WBC: 6.3 10*3/uL (ref 4.0–10.5)

## 2013-02-08 LAB — ETHANOL: Alcohol, Ethyl (B): 424 mg/dL (ref 0–11)

## 2013-02-08 MED ORDER — LORAZEPAM 2 MG/ML IJ SOLN
1.0000 mg | Freq: Once | INTRAMUSCULAR | Status: AC
  Administered 2013-02-08: 1 mg via INTRAVENOUS

## 2013-02-08 MED ORDER — LORAZEPAM 2 MG/ML IJ SOLN
1.0000 mg | Freq: Four times a day (QID) | INTRAMUSCULAR | Status: DC | PRN
  Filled 2013-02-08 (×2): qty 1

## 2013-02-08 MED ORDER — THIAMINE HCL 100 MG/ML IJ SOLN
100.0000 mg | Freq: Every day | INTRAMUSCULAR | Status: DC
  Filled 2013-02-08: qty 1

## 2013-02-08 MED ORDER — ADULT MULTIVITAMIN W/MINERALS CH
1.0000 | ORAL_TABLET | Freq: Every day | ORAL | Status: DC
  Administered 2013-02-09 – 2013-02-10 (×2): 1 via ORAL
  Filled 2013-02-08 (×2): qty 1

## 2013-02-08 MED ORDER — LORAZEPAM 1 MG PO TABS
0.0000 mg | ORAL_TABLET | Freq: Four times a day (QID) | ORAL | Status: DC
  Administered 2013-02-09: 1 mg via ORAL
  Filled 2013-02-08: qty 2

## 2013-02-08 MED ORDER — ZIPRASIDONE MESYLATE 20 MG IM SOLR
20.0000 mg | Freq: Once | INTRAMUSCULAR | Status: AC
  Administered 2013-02-08: 20 mg via INTRAMUSCULAR
  Filled 2013-02-08: qty 20

## 2013-02-08 MED ORDER — ONDANSETRON HCL 4 MG PO TABS: 4.0000 mg | ORAL_TABLET | Freq: Three times a day (TID) | ORAL | Status: DC | PRN

## 2013-02-08 MED ORDER — NICOTINE 21 MG/24HR TD PT24
21.0000 mg | MEDICATED_PATCH | Freq: Every day | TRANSDERMAL | Status: DC
  Administered 2013-02-09 – 2013-02-11 (×3): 21 mg via TRANSDERMAL
  Filled 2013-02-08 (×5): qty 1

## 2013-02-08 MED ORDER — IBUPROFEN 200 MG PO TABS: 600.0000 mg | ORAL_TABLET | Freq: Three times a day (TID) | ORAL | Status: DC | PRN

## 2013-02-08 MED ORDER — VITAMIN B-1 100 MG PO TABS
100.0000 mg | ORAL_TABLET | Freq: Every day | ORAL | Status: DC
  Administered 2013-02-09 – 2013-02-12 (×4): 100 mg via ORAL
  Filled 2013-02-08 (×4): qty 1

## 2013-02-08 MED ORDER — LORAZEPAM 1 MG PO TABS: 0.0000 mg | ORAL_TABLET | Freq: Two times a day (BID) | ORAL | Status: DC

## 2013-02-08 MED ORDER — FOLIC ACID 1 MG PO TABS
1.0000 mg | ORAL_TABLET | Freq: Every day | ORAL | Status: DC
  Administered 2013-02-09 – 2013-02-10 (×2): 1 mg via ORAL
  Filled 2013-02-08 (×2): qty 1

## 2013-02-08 MED ORDER — LORAZEPAM 1 MG PO TABS
1.0000 mg | ORAL_TABLET | Freq: Four times a day (QID) | ORAL | Status: DC | PRN
  Administered 2013-02-09: 1 mg via ORAL
  Filled 2013-02-08 (×2): qty 1
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
## 2013-02-08 NOTE — ED Provider Notes (Signed)
History     CSN: [id number]  Arrival date & time 02/08/13  2010   First MD Initiated Contact with Patient 02/08/13 2041      Chief Complaint  Patient presents with  . Medical Clearance   level V caveat applies secondary to alcohol intoxication and cooperation  HPI  History provided by the patient and EMS report. Patient is a 42 year old male with past history is of bipolar disorder, depression and alcohol abuse who presents by EMS intoxicated. Patient does not cooperate much and does not offer much history. He does occasionally state that he is sad and angry at the government for making him kill people. Patient is not offer any other details. Per EMS patient was found at a friend's house where his reported to been drinking and intoxicated with alcohol. There was concerns by friends of his safety to self and the patient was making statements about harming himself or dying.    Past Medical History  Diagnosis Date  . Alcohol abuse   . Anxiety   . Depression   . Bipolar 2 disorder   . Active smoker     Past Surgical History  Procedure Laterality Date  . Lymph node dissection      Family History  Problem Relation Age of Onset  . Alcohol abuse Father     History  Substance Use Topics  . Smoking status: Current Every Day Smoker -- 2.00 packs/day for 20 years  . Smokeless tobacco: Not on file  . Alcohol Use: 151.2 oz/week    252 Cans of beer per week     Comment: heavy       Review of Systems  Unable to perform ROS: Other    Allergies  Review of patient's allergies indicates no known allergies.  Home Medications  No current outpatient prescriptions on file.  BP 152/96  Pulse 109  Temp(Src) 98.5 F (36.9 C) (Oral)  Resp 20  SpO2 94%  Physical Exam  Nursing note and vitals reviewed. Constitutional: He is oriented to person, place, and time. He appears well-developed and well-nourished. No distress.  HENT:  Head: Normocephalic.  Cardiovascular: Normal rate  and regular rhythm.   Pulmonary/Chest: Effort normal.  Abdominal: Soft.  Musculoskeletal: Normal range of motion.  Neurological: He is alert and oriented to person, place, and time.  Speech slightly slurred. Appears intoxicated  Skin: Skin is warm.  Psychiatric: His mood appears anxious. He exhibits a depressed mood. He expresses suicidal ideation.  Tearful    ED Course  Procedures   Results for orders placed during the hospital encounter of 02/08/13  CBC      Result Value Range   WBC 6.3  4.0 - 10.5 K/uL   RBC 5.95 (*) 4.22 - 5.81 MIL/uL   Hemoglobin 16.1  13.0 - 17.0 g/dL   HCT 08.6  57.8 - 46.9 %   MCV 77.1 (*) 78.0 - 100.0 fL   MCH 27.1  26.0 - 34.0 pg   MCHC 35.1  30.0 - 36.0 g/dL   RDW 62.9  52.8 - 41.3 %   Platelets 189  150 - 400 K/uL  COMPREHENSIVE METABOLIC PANEL      Result Value Range   Sodium 143  135 - 145 mEq/L   Potassium 3.7  3.5 - 5.1 mEq/L   Chloride 103  96 - 112 mEq/L   CO2 26  19 - 32 mEq/L   Glucose, Bld 106 (*) 70 - 99 mg/dL   BUN 7  6 -  23 mg/dL   Creatinine, Ser 4.09  0.50 - 1.35 mg/dL   Calcium 8.9  8.4 - 81.1 mg/dL   Total Protein 8.4 (*) 6.0 - 8.3 g/dL   Albumin 4.2  3.5 - 5.2 g/dL   AST 67 (*) 0 - 37 U/L   ALT 57 (*) 0 - 53 U/L   Alkaline Phosphatase 69  39 - 117 U/L   Total Bilirubin 0.3  0.3 - 1.2 mg/dL   GFR calc non Af Amer >90  >90 mL/min   GFR calc Af Amer >90  >90 mL/min  ETHANOL      Result Value Range   Alcohol, Ethyl (B) 424 (*) 0 - 11 mg/dL  URINE RAPID DRUG SCREEN (HOSP PERFORMED)      Result Value Range   Opiates NONE DETECTED  NONE DETECTED   Cocaine NONE DETECTED  NONE DETECTED   Benzodiazepines NONE DETECTED  NONE DETECTED   Amphetamines NONE DETECTED  NONE DETECTED   Tetrahydrocannabinol NONE DETECTED  NONE DETECTED   Barbiturates NONE DETECTED  NONE DETECTED       No results found.   1. Alcohol intoxication       MDM  8:55 PM patient seen and evaluated. Patient awake and alert but not require  operative does not offer much history. He does appear intoxicated.  Psych holding orders in place.  Tele psych consult ordered.      Angus Seller, PA-C 02/09/13 [phone]

## 2013-02-08 NOTE — ED Notes (Signed)
Clothing, shoes, necklace, and car keys in pt belongings bag.  No wallet noted.

## 2013-02-08 NOTE — ED Notes (Signed)
[id number]> Expected date:<BR> Expected time:<BR> Means of arrival:<BR> Comments:<BR> EMS/42 yo male-suicidal-hx PTSD-intoxicated

## 2013-02-09 ENCOUNTER — Encounter (HOSPITAL_COMMUNITY): Payer: Self-pay

## 2013-02-09 DIAGNOSIS — F10239 Alcohol dependence with withdrawal, unspecified: Principal | ICD-10-CM

## 2013-02-09 DIAGNOSIS — F1010 Alcohol abuse, uncomplicated: Secondary | ICD-10-CM

## 2013-02-09 DIAGNOSIS — F329 Major depressive disorder, single episode, unspecified: Secondary | ICD-10-CM

## 2013-02-09 MED ORDER — LORAZEPAM 2 MG/ML IJ SOLN: 0.5000 mg | INTRAMUSCULAR | Status: DC | PRN

## 2013-02-09 MED ORDER — NICOTINE 21 MG/24HR TD PT24: 21.0000 mg | MEDICATED_PATCH | Freq: Every day | TRANSDERMAL | Status: DC

## 2013-02-09 MED ORDER — LORAZEPAM 2 MG/ML IJ SOLN
0.0000 mg | Freq: Four times a day (QID) | INTRAMUSCULAR | Status: DC
  Administered 2013-02-09 – 2013-02-10 (×3): 2 mg via INTRAVENOUS
  Administered 2013-02-10: 4 mg via INTRAVENOUS
  Filled 2013-02-09 (×4): qty 1
  Filled 2013-02-09: qty 2

## 2013-02-09 MED ORDER — LORAZEPAM 2 MG/ML IJ SOLN
2.0000 mg | Freq: Once | INTRAMUSCULAR | Status: AC
  Administered 2013-02-09: 2 mg via INTRAVENOUS

## 2013-02-09 MED ORDER — OXYCODONE-ACETAMINOPHEN 5-325 MG PO TABS: 1.0000 | ORAL_TABLET | Freq: Four times a day (QID) | ORAL | Status: DC | PRN

## 2013-02-09 MED ORDER — ENOXAPARIN SODIUM 40 MG/0.4ML ~~LOC~~ SOLN
40.0000 mg | SUBCUTANEOUS | Status: DC
  Administered 2013-02-09 – 2013-02-10 (×2): 40 mg via SUBCUTANEOUS
  Filled 2013-02-09 (×5): qty 0.4

## 2013-02-09 MED ORDER — ONDANSETRON HCL 4 MG PO TABS: 4.0000 mg | ORAL_TABLET | Freq: Four times a day (QID) | ORAL | Status: DC | PRN

## 2013-02-09 MED ORDER — THIAMINE HCL 100 MG/ML IJ SOLN
100.0000 mg | Freq: Every day | INTRAMUSCULAR | Status: DC
Start: 2013-02-09 — End: 2013-02-09

## 2013-02-09 MED ORDER — LORAZEPAM 2 MG/ML IJ SOLN
1.0000 mg | Freq: Four times a day (QID) | INTRAMUSCULAR | Status: DC | PRN
  Administered 2013-02-09 – 2013-02-10 (×2): 1 mg via INTRAVENOUS
  Filled 2013-02-09 (×3): qty 1

## 2013-02-09 MED ORDER — KETOROLAC TROMETHAMINE 30 MG/ML IJ SOLN
30.0000 mg | Freq: Once | INTRAMUSCULAR | Status: AC
  Administered 2013-02-09: 30 mg via INTRAVENOUS
  Filled 2013-02-09: qty 1

## 2013-02-09 MED ORDER — ADULT MULTIVITAMIN W/MINERALS CH: 1.0000 | ORAL_TABLET | Freq: Every day | ORAL | Status: DC

## 2013-02-09 MED ORDER — VITAMIN B-1 100 MG PO TABS: 100.0000 mg | ORAL_TABLET | Freq: Every day | ORAL | Status: DC

## 2013-02-09 MED ORDER — ONDANSETRON HCL 4 MG/2ML IJ SOLN
4.0000 mg | Freq: Four times a day (QID) | INTRAMUSCULAR | Status: DC | PRN
  Administered 2013-02-10 (×2): 4 mg via INTRAVENOUS
  Filled 2013-02-09 (×2): qty 2

## 2013-02-09 MED ORDER — FOLIC ACID 1 MG PO TABS: 1.0000 mg | ORAL_TABLET | Freq: Every day | ORAL | Status: DC

## 2013-02-09 MED ORDER — HYDRALAZINE HCL 20 MG/ML IJ SOLN: 5.0000 mg | Freq: Once | INTRAMUSCULAR | Status: DC

## 2013-02-09 MED ORDER — LORAZEPAM 1 MG PO TABS: 1.0000 mg | ORAL_TABLET | Freq: Four times a day (QID) | ORAL | Status: DC | PRN

## 2013-02-09 MED ORDER — SODIUM CHLORIDE 0.9 % IV SOLN
INTRAVENOUS | Status: DC
  Administered 2013-02-09 – 2013-02-10 (×2): via INTRAVENOUS
  Administered 2013-02-11: 75 mL via INTRAVENOUS
  Administered 2013-02-11 – 2013-02-12 (×2): via INTRAVENOUS

## 2013-02-09 MED ORDER — LORAZEPAM 1 MG PO TABS
2.0000 mg | ORAL_TABLET | Freq: Once | ORAL | Status: AC
  Administered 2013-02-09: 2 mg via ORAL
  Filled 2013-02-09: qty 2

## 2013-02-09 MED ORDER — LORAZEPAM 2 MG/ML IJ SOLN: 0.0000 mg | Freq: Two times a day (BID) | INTRAMUSCULAR | Status: DC

## 2013-02-09 NOTE — H&P (Signed)
[facility] History and Physical  [name] JXB:[id number] DOB: September 23, 1971 DOA: 02/08/2013  Referring physician: ED physician PCP: [name], MD   Chief Complaint: alcohol withdrawal   HPI:  Pt is 42 yo male with history of alcohol abuse and alcohol withdrawal, presented to [facility] ED after having suicidal ideations, was markedly intoxicated on arrival and unable to provide history. Pt is more alert at the time of my visit but still unable to provide detail history and is relatively poor historian. TRH has been asked to admit for withdrawal and requested admission to SDU. Pt denies chest pain or shortness of breath, no specific abdominal or urinary concerns.   Assessment and Plan:  Principal Problem:   Alcohol withdrawal syndrome - plan on admitting to SDU for closer monitoring - place on CIWA protocol - continue Ativan per CIWA, multivitamin, folic acid and thiamine - allow for regular diet, repeat CMET and CBC in AM Active Problems:   Alcohol abuse - psych consultation and social work consult to discuss cessation    Depression - will need admission to [facility] once medically cleared - details on SI or HI are not very clear - I have place consult for psych evaluation and recommendations    Transaminitis - secondary to alcohol abuse  Code Status: Full Family Communication: Pt at bedside Disposition Plan: Admit to SDU    Review of Systems:  Unable to obtain due to relatively altered mental status    Past Medical History  Diagnosis Date  . Alcohol abuse   . Anxiety   . Depression   . Bipolar 2 disorder   . Active smoker     Past Surgical History  Procedure Laterality Date  . Lymph node dissection      Social History:  reports that he has been smoking.  He does not have any smokeless tobacco history on file. He reports that he drinks about 151.2 ounces of alcohol per week. He reports that he uses illicit drugs (Marijuana).  No Known Allergies  Family  History  Problem Relation Age of Onset  . Alcohol abuse Father     Prior to Admission medications   Not on File    Physical Exam: Filed Vitals:   02/09/13 0626 02/09/13 0931 02/09/13 0940 02/09/13 1207  BP: 117/76 141/91 141/91 145/79  Pulse: 87 104 104 105  Temp: 98.5 F (36.9 C) 98.3 F (36.8 C)  97.6 F (36.4 C)  TempSrc: Oral Oral  Oral  Resp: 18 16  24   SpO2: 94% 93%  97%    Physical Exam  Constitutional: Appears calm but tremulous, not in acute distress  HENT: Normocephalic. External right and left ear normal. Oropharynx is clear and moist.  Eyes: Conjunctivae and EOM are normal. PERRLA, no scleral icterus.  Neck: Normal ROM. Neck supple. No JVD. No tracheal deviation. No thyromegaly.  CVS: RRR, S1/S2 +, no murmurs, no gallops, no carotid bruit.  Pulmonary: Effort and breath sounds normal, no stridor, rhonchi, wheezes, rales.  Abdominal: Soft. BS +,  no distension, tenderness, rebound or guarding.  Musculoskeletal: Normal range of motion. No edema and no tenderness.  Lymphadenopathy: No lymphadenopathy noted, cervical, inguinal. Neuro: Alert. Difficulty with finger to nose and heal to sheen, follows commands appropriately, overall non focal  Skin: Skin is warm and dry. No rash noted. Not diaphoretic. No erythema. No pallor.  Psychiatric: Appears calm at the time   Labs on Admission:  Basic Metabolic Panel:  Recent Labs Lab 02/08/13 2034  NA  143  K 3.7  CL 103  CO2 26  GLUCOSE 106*  BUN 7  CREATININE 0.63  CALCIUM 8.9   Liver Function Tests:  Recent Labs Lab 02/08/13 2034  AST 67*  ALT 57*  ALKPHOS 69  BILITOT 0.3  PROT 8.4*  ALBUMIN 4.2   CBC:  Recent Labs Lab 02/08/13 2034  WBC 6.3  HGB 16.1  HCT 45.9  MCV 77.1*  PLT 189   Radiological Exams on Admission: No results found.  EKG: Normal sinus rhythm, no ST/T wave changes  Debbora Presto, MD  [facility] Pager [phone]  If 7PM-7AM, please contact  night-coverage [email or web address] Password [facility] 02/09/2013, 12:15 PM

## 2013-02-09 NOTE — ED Notes (Signed)
Resting quietly, sitter at bedside-per charge RN, do not wake patient due to increased agitation-meds not given by night RN r/t agitation

## 2013-02-09 NOTE — BHH Counselor (Signed)
Pending medical admit, per Dr. Benjiman Core.

## 2013-02-09 NOTE — ED Notes (Signed)
IVC papers requested by Dr Juleen China

## 2013-02-09 NOTE — ED Provider Notes (Signed)
Medical screening examination/treatment/procedure(s) were performed by non-physician practitioner and as supervising physician I was immediately available for consultation/collaboration.  Raeford Razor, MD 02/09/13 1455

## 2013-02-09 NOTE — BHH Counselor (Signed)
Attempted to assess patient today. Patient  stated, "I am very sick right now can we do this later". Patient explained that he has been passing out on/off since his arrival to the ED. Patient then sat up on his bed and stated, "I can't breathe..help me please" . Corporate treasurer at patients bedside who grabbed a trash can as patient began to vomit. EPD and nurse also notified.

## 2013-02-09 NOTE — ED Provider Notes (Signed)
Patient sleeping comfortably this morning. Reportedly has been homicidal and depressed. Was markedly intoxicated. Recently discharged from [employer]. Will need psych eval and likey placement.   [name]. Rubin Payor, MD 02/09/13 [phone]

## 2013-02-10 DIAGNOSIS — F1994 Other psychoactive substance use, unspecified with psychoactive substance-induced mood disorder: Secondary | ICD-10-CM

## 2013-02-10 DIAGNOSIS — F1020 Alcohol dependence, uncomplicated: Secondary | ICD-10-CM

## 2013-02-10 LAB — CBC
Hemoglobin: 13.5 g/dL (ref 13.0–17.0)
MCH: 25.9 pg — ABNORMAL LOW (ref 26.0–34.0)
MCV: 77.8 fL — ABNORMAL LOW (ref 78.0–100.0)
RBC: 5.22 MIL/uL (ref 4.22–5.81)

## 2013-02-10 LAB — COMPREHENSIVE METABOLIC PANEL
BUN: 11 mg/dL (ref 6–23)
CO2: 30 mEq/L (ref 19–32)
Calcium: 8.9 mg/dL (ref 8.4–10.5)
Creatinine, Ser: 0.71 mg/dL (ref 0.50–1.35)
GFR calc Af Amer: >90 mL/min (ref >90)
GFR calc non Af Amer: >90 mL/min (ref >90)
Glucose, Bld: 87 mg/dL (ref 70–99)

## 2013-02-10 MED ORDER — LORAZEPAM 2 MG/ML IJ SOLN
INTRAMUSCULAR | Status: AC
  Administered 2013-02-10: 2 mg
  Filled 2013-02-10: qty 1

## 2013-02-10 MED ORDER — HYDROXYZINE HCL 25 MG PO TABS
25.0000 mg | ORAL_TABLET | Freq: Four times a day (QID) | ORAL | Status: DC | PRN
  Administered 2013-02-11 (×2): 25 mg via ORAL
  Filled 2013-02-10 (×2): qty 1

## 2013-02-10 MED ORDER — DIPHENHYDRAMINE HCL 50 MG/ML IJ SOLN
25.0000 mg | Freq: Once | INTRAMUSCULAR | Status: AC
  Administered 2013-02-10: 25 mg via INTRAVENOUS
  Filled 2013-02-10: qty 1

## 2013-02-10 MED ORDER — LORAZEPAM 2 MG/ML IJ SOLN
2.0000 mg | Freq: Three times a day (TID) | INTRAMUSCULAR | Status: DC | PRN
  Administered 2013-02-10 – 2013-02-12 (×5): 2 mg via INTRAVENOUS
  Filled 2013-02-10 (×6): qty 1

## 2013-02-10 MED ORDER — ONDANSETRON 4 MG PO TBDP
4.0000 mg | ORAL_TABLET | Freq: Four times a day (QID) | ORAL | Status: DC | PRN
  Filled 2013-02-10: qty 1

## 2013-02-10 MED ORDER — LORAZEPAM 1 MG PO TABS: 2.0000 mg | ORAL_TABLET | Freq: Once | ORAL | Status: DC

## 2013-02-10 MED ORDER — CHLORDIAZEPOXIDE HCL 25 MG PO CAPS
50.0000 mg | ORAL_CAPSULE | Freq: Once | ORAL | Status: AC
  Administered 2013-02-10: 50 mg via ORAL
  Filled 2013-02-10: qty 2

## 2013-02-10 MED ORDER — ZOLPIDEM TARTRATE 5 MG PO TABS
5.0000 mg | ORAL_TABLET | Freq: Once | ORAL | Status: AC
  Administered 2013-02-10: 5 mg via ORAL
  Filled 2013-02-10: qty 1

## 2013-02-10 MED ORDER — ADULT MULTIVITAMIN W/MINERALS CH
1.0000 | ORAL_TABLET | Freq: Every day | ORAL | Status: DC
  Administered 2013-02-11 – 2013-02-12 (×2): 1 via ORAL
  Filled 2013-02-10 (×3): qty 1

## 2013-02-10 MED ORDER — METOCLOPRAMIDE HCL 5 MG/ML IJ SOLN
10.0000 mg | Freq: Once | INTRAMUSCULAR | Status: AC
  Administered 2013-02-10: 10 mg via INTRAVENOUS
  Filled 2013-02-10: qty 2

## 2013-02-10 MED ORDER — ACETAMINOPHEN 500 MG PO TABS: 500.0000 mg | ORAL_TABLET | Freq: Four times a day (QID) | ORAL | Status: DC | PRN

## 2013-02-10 MED ORDER — CHLORDIAZEPOXIDE HCL 25 MG PO CAPS
25.0000 mg | ORAL_CAPSULE | Freq: Four times a day (QID) | ORAL | Status: DC | PRN
  Administered 2013-02-10 – 2013-02-11 (×3): 25 mg via ORAL
  Filled 2013-02-10 (×3): qty 1

## 2013-02-10 MED ORDER — THIAMINE HCL 100 MG/ML IJ SOLN
100.0000 mg | Freq: Once | INTRAMUSCULAR | Status: DC
  Filled 2013-02-10: qty 1

## 2013-02-10 MED ORDER — LORAZEPAM 2 MG/ML IJ SOLN
2.0000 mg | Freq: Once | INTRAMUSCULAR | Status: AC
  Administered 2013-02-10: 2 mg via INTRAVENOUS
  Filled 2013-02-10: qty 1

## 2013-02-10 MED ORDER — LORAZEPAM 2 MG/ML IJ SOLN
2.0000 mg | Freq: Once | INTRAMUSCULAR | Status: AC
  Administered 2013-02-10: 2 mg via INTRAVENOUS

## 2013-02-10 MED ORDER — LOPERAMIDE HCL 2 MG PO CAPS: 2.0000 mg | ORAL_CAPSULE | ORAL | Status: DC | PRN

## 2013-02-10 NOTE — Consult Note (Signed)
Reason for Consult: Alcohol intoxication, Alcohol dependence, Substance induced mood disorder, SI/HI  Referring Physician: Dr. Lesia Hausen is an 41 y.o. male.  HPI: Patient has complaints of severe withdrawal symptoms of stomach upset, nausea, vomiting, sweating, disturbed vitals and increased anxiety and asked to be released but was placed on IVC because of severe intoxication and danger to himself. It was informed that his wife gave him warning that he needs to seeking treatment before he can contact her again. He stated that he has been bing drinking four day in a row as he has been separated from his wife and has been in relationship with his 54 years old son. 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 He was previously taken detox treatment at [facility] treatment [facility], [facility],  and [facility]. Patient stated that he has been feeling depression and anxiety and suicidal ideations. He was history of alcohol abuse and alcohol withdrawal, presented to [facility] ED after having suicidal ideations. He was markedly intoxicated on arrival with BAL is 424.    MSE: Patient appeared restless, unable to stay in his bed, standing and wants to walk on the unit and feels nervous and hand tremors. He has anxious and depressed mood and anxious affect. He has increased psychomotor activity and normal speech and thought process. He has poor insight, judgment and impulse control  Past Medical History  Diagnosis Date  . Alcohol abuse   . Anxiety   . Depression   . Bipolar 2 disorder   . Active smoker     Past Surgical History  Procedure Laterality Date  . Lymph node dissection      Family History  Problem Relation Age of Onset  . Alcohol abuse Father     Social History:  reports that he has been smoking Cigarettes.  He has a 40 pack-year smoking history. He has never used smokeless tobacco. He reports that he drinks about 151.2 ounces of alcohol per week. He reports  that he uses illicit drugs (Marijuana).  Allergies: No Known Allergies  Medications: I have reviewed the patient's current medications.  Results for orders placed during the hospital encounter of 02/08/13 (from the past 48 hour(s))  CBC     Status: Abnormal   Collection Time    02/08/13  8:34 PM      Result Value Range   WBC 6.3  4.0 - 10.5 K/uL   RBC 5.95 (*) 4.22 - 5.81 MIL/uL   Hemoglobin 16.1  13.0 - 17.0 g/dL   HCT 09.6  04.5 - 40.9 %   MCV 77.1 (*) 78.0 - 100.0 fL   MCH 27.1  26.0 - 34.0 pg   MCHC 35.1  30.0 - 36.0 g/dL   RDW 81.1  91.4 - 78.2 %   Platelets 189  150 - 400 K/uL  COMPREHENSIVE METABOLIC PANEL     Status: Abnormal   Collection Time    02/08/13  8:34 PM      Result Value Range   Sodium 143  135 - 145 mEq/L   Potassium 3.7  3.5 - 5.1 mEq/L   Chloride 103  96 - 112 mEq/L   CO2 26  19 - 32 mEq/L   Glucose, Bld 106 (*) 70 - 99 mg/dL   BUN 7  6 - 23 mg/dL   Creatinine, Ser 9.56  0.50 - 1.35 mg/dL   Calcium 8.9  8.4 - 21.3 mg/dL   Total Protein 8.4 (*) 6.0 -  8.3 g/dL   Albumin 4.2  3.5 - 5.2 g/dL   AST 67 (*) 0 - 37 U/L   ALT 57 (*) 0 - 53 U/L   Alkaline Phosphatase 69  39 - 117 U/L   Total Bilirubin 0.3  0.3 - 1.2 mg/dL   GFR calc non Af Amer >90  >90 mL/min   GFR calc Af Amer >90  >90 mL/min   Comment:            The eGFR has been calculated     using the CKD EPI equation.     This calculation has not been     validated in all clinical     situations.     eGFR's persistently     <90 mL/min signify     possible Chronic Kidney Disease.  ETHANOL     Status: Abnormal   Collection Time    02/08/13  8:34 PM      Result Value Range   Alcohol, Ethyl (B) 424 (*) 0 - 11 mg/dL   Comment:            LOWEST DETECTABLE LIMIT FOR     SERUM ALCOHOL IS 11 mg/dL     FOR MEDICAL PURPOSES ONLY     CRITICAL RESULT CALLED TO, READ BACK BY AND VERIFIED WITH:     Talbert Nan RN 2124 02/08/13 A NAVARRO  URINE RAPID DRUG SCREEN (HOSP PERFORMED)     Status: None    Collection Time    02/08/13  8:50 PM      Result Value Range   Opiates NONE DETECTED  NONE DETECTED   Cocaine NONE DETECTED  NONE DETECTED   Benzodiazepines NONE DETECTED  NONE DETECTED   Amphetamines NONE DETECTED  NONE DETECTED   Tetrahydrocannabinol NONE DETECTED  NONE DETECTED   Barbiturates NONE DETECTED  NONE DETECTED   Comment:            DRUG SCREEN FOR MEDICAL PURPOSES     ONLY.  IF CONFIRMATION IS NEEDED     FOR ANY PURPOSE, NOTIFY LAB     WITHIN 5 DAYS.                LOWEST DETECTABLE LIMITS     FOR URINE DRUG SCREEN     Drug Class       Cutoff (ng/mL)     Amphetamine      1000     Barbiturate      200     Benzodiazepine   200     Tricyclics       300     Opiates          300     Cocaine          300     THC              50  MRSA PCR SCREENING     Status: None   Collection Time    02/09/13  3:45 PM      Result Value Range   MRSA by PCR NEGATIVE  NEGATIVE   Comment:            The GeneXpert MRSA Assay (FDA     approved for NASAL specimens     only), is one component of a     comprehensive MRSA colonization     surveillance program. It is not     intended to diagnose MRSA     infection nor  to guide or     monitor treatment for     MRSA infections.  CBC     Status: Abnormal   Collection Time    02/10/13  6:07 AM      Result Value Range   WBC 5.8  4.0 - 10.5 K/uL   RBC 5.22  4.22 - 5.81 MIL/uL   Hemoglobin 13.5  13.0 - 17.0 g/dL   HCT 29.5  28.4 - 13.2 %   MCV 77.8 (*) 78.0 - 100.0 fL   MCH 25.9 (*) 26.0 - 34.0 pg   MCHC 33.3  30.0 - 36.0 g/dL   RDW 44.0  10.2 - 72.5 %   Platelets 123 (*) 150 - 400 K/uL   Comment: SPECIMEN CHECKED FOR CLOTS     REPEATED TO VERIFY     DELTA CHECK NOTED  COMPREHENSIVE METABOLIC PANEL     Status: Abnormal   Collection Time    02/10/13  6:07 AM      Result Value Range   Sodium 139  135 - 145 mEq/L   Potassium 4.1  3.5 - 5.1 mEq/L   Chloride 104  96 - 112 mEq/L   CO2 30  19 - 32 mEq/L   Glucose, Bld 87  70 - 99 mg/dL    BUN 11  6 - 23 mg/dL   Creatinine, Ser 3.66  0.50 - 1.35 mg/dL   Calcium 8.9  8.4 - 44.0 mg/dL   Total Protein 6.5  6.0 - 8.3 g/dL   Albumin 3.2 (*) 3.5 - 5.2 g/dL   AST 55 (*) 0 - 37 U/L   ALT 42  0 - 53 U/L   Alkaline Phosphatase 61  39 - 117 U/L   Total Bilirubin 1.0  0.3 - 1.2 mg/dL   GFR calc non Af Amer >90  >90 mL/min   GFR calc Af Amer >90  >90 mL/min   Comment:            The eGFR has been calculated     using the CKD EPI equation.     This calculation has not been     validated in all clinical     situations.     eGFR's persistently     <90 mL/min signify     possible Chronic Kidney Disease.    No results found.  Positive for anorexia, anxiety, bad mood, behavior problems, depression, excessive alcohol consumption, separation anxiety, sleep disturbance and tobacco use Blood pressure 130/76, pulse 113, temperature 98.2 F (36.8 C), temperature source Oral, resp. rate 19, height 5\' 10"  (1.778 m), weight 166 lb 3.6 oz (75.4 kg), SpO2 97.00%.   Assessment/Plan: Alcohol intoxication Alcohol dependence Substance induced mood disorder  Recommendation: Will stop his ativan protocol which is not strong enough to control his withdrawal symptoms. Will start Librium 50 mg loading dose and librium protocol with supportive therapy. Continue IVC and sitter.   Janmarie Smoot,JANARDHAHA R. 02/10/2013, 2:44 PM

## 2013-02-10 NOTE — H&P (Signed)
[facility] History and Physical  [name] ZOX:[id number] DOB: 1971/07/20 DOA: 02/08/2013  Referring physician: ED physician PCP: [name], MD   Chief Complaint: alcohol withdrawal   HPI:  Pt is 42 yo male with history of alcohol abuse and alcohol withdrawal, presented to [facility] ED after having suicidal ideations, was markedly intoxicated on arrival and unable to provide history. Pt is more alert at the time of my visit but still unable to provide detail history and is relatively poor historian. TRH has been asked to admit for withdrawal and requested admission to SDU. Pt denies chest pain or shortness of breath, no specific abdominal or urinary concerns.   Assessment and Plan:  Alcohol withdrawal syndrome - plan on admitting to SDU for closer monitoring - place on CIWA protocol - continue multivitamin, folic acid and thiamine - allow for regular diet, repeat CMET and CBC in AM - psych consulted, appreciate input. IVC and sitter    Alcohol abuse - psych consultation and social work consult to discuss cessation     Depression - will need admission to [facility] once medically cleared, appreciate psych input - details on SI or HI are not very clear; patient denies SI today     Transaminitis - secondary to alcohol abuse - improving  Code Status: Full Family Communication: Pt at bedside Disposition Plan: remain inpatient.    Past Medical History  Diagnosis Date  . Alcohol abuse   . Anxiety   . Depression   . Bipolar 2 disorder   . Active smoker     Past Surgical History  Procedure Laterality Date  . Lymph node dissection      Social History:  reports that he has been smoking Cigarettes.  He has a 40 pack-year smoking history. He has never used smokeless tobacco. He reports that he drinks about 151.2 ounces of alcohol per week. He reports that he uses illicit drugs (Marijuana).  No Known Allergies  Family History  Problem Relation Age of Onset  .  Alcohol abuse Father     Prior to Admission medications   Not on File    Physical Exam: Filed Vitals:   02/10/13 0300 02/10/13 0344 02/10/13 0400 02/10/13 0800  BP:   115/63 127/87  Pulse: 80  80 92  Temp:  98.5 F (36.9 C)    TempSrc:      Resp: 17  18 24   Height:      Weight:  75.4 kg (166 lb 3.6 oz)    SpO2: 96%  95% 92%   Physical Exam  Constitutional: Appears calm but tremulous, not in acute distress  HENT: Normocephalic. External right and left ear normal. Oropharynx is clear and moist.  Eyes: Conjunctivae and EOM are normal. PERRLA, no scleral icterus.  Neck: Normal ROM. Neck supple. No JVD. No tracheal deviation. No thyromegaly.  CVS: RRR, S1/S2 +, no murmurs, no gallops, no carotid bruit.  Pulmonary: Effort and breath sounds normal, no stridor, rhonchi, wheezes, rales.  Abdominal: Soft. BS +,  no distension, tenderness, rebound or guarding.  Musculoskeletal: Normal range of motion. No edema and no tenderness.  Lymphadenopathy: No lymphadenopathy noted, cervical, inguinal. Neuro: Alert. Difficulty with finger to nose and heal to sheen, follows commands appropriately, overall non focal  Skin: Skin is warm and dry. No rash noted. Not diaphoretic. No erythema. No pallor.  Psychiatric: Appears calm at the time   Labs on Admission:  Basic Metabolic Panel:  Recent Labs Lab 02/08/13 2034 02/10/13 0607  NA  143 139  K 3.7 4.1  CL 103 104  CO2 26 30  GLUCOSE 106* 87  BUN 7 11  CREATININE 0.63 0.71  CALCIUM 8.9 8.9   Liver Function Tests:  Recent Labs Lab 02/08/13 2034 02/10/13 0607  AST 67* 55*  ALT 57* 42  ALKPHOS 69 61  BILITOT 0.3 1.0  PROT 8.4* 6.5  ALBUMIN 4.2 3.2*   CBC:  Recent Labs Lab 02/08/13 2034 02/10/13 0607  WBC 6.3 5.8  HGB 16.1 13.5  HCT 45.9 40.6  MCV 77.1* 77.8*  PLT 189 123*   EKG: Normal sinus rhythm, no ST/T wave changes  Pamella Pert, MD  [facility] Pager [phone]  If 7PM-7AM, please contact  night-coverage [email or web address] Password [email or web address] 02/10/2013, 10:14 AM

## 2013-02-10 NOTE — Progress Notes (Signed)
Clinical Social Work Department CLINICAL SOCIAL WORK PSYCHIATRY SERVICE LINE ASSESSMENT 02/10/2013  Patient:  [name]  Account:  [id number]  Admit Date:  02/08/2013  Clinical Social Worker:  Unk Lightning, LCSW  Date/Time:  02/10/2013 02:30 PM Referred by:  Physician  Date referred:  02/10/2013 Reason for Referral  Substance Abuse  Psychosocial assessment   Presenting Symptoms/Problems (In the person's/family's own words):   Psych consulted due to ETOH intoxication and SI and HI.   Abuse/Neglect/Trauma History (check all that apply)  Denies history   Abuse/Neglect/Trauma Comments:   Psychiatric History (check all that apply)  Outpatient treatment   Psychiatric medications:  None currently   Current Mental Health Hospitalizations/Previous Mental Health History:   Patient reports he has been diagnosed with depression and anxiety. Patient reports that depression is more situational and gives the example of getting divorced and missing his child. Patient reports no current treatment for depression or anxiety.   Current provider:   None   Place and Date:   N/A   Current Medications:   acetaminophen, LORazepam, LORazepam, LORazepam, ondansetron (ZOFRAN) IV, ondansetron            . enoxaparin (LOVENOX) injection  40 mg Subcutaneous Q24H  . folic acid  1 mg Oral Daily  . LORazepam  0-4 mg Intravenous Q6H   Followed by     . [START ON 02/11/2013] LORazepam  0-4 mg Intravenous Q12H  . multivitamin with minerals  1 tablet Oral Daily  . nicotine  21 mg Transdermal Daily  . thiamine  100 mg Oral Daily   Or     . thiamine  100 mg Intravenous Daily   Previous Impatient Admission/Date/Reason:   Patient denies any hospitalizations.   Emotional Health / Current Symptoms    Suicide/Self Harm  None reported   Suicide attempt in the past:   Patient denies any SI or HI. Patient denies any SI or HI on admission and reports "I was drunk when I came in."   Other harmful  behavior:   Patient admits to periods of binge drinking.   Psychotic/Dissociative Symptoms  None reported   Other Psychotic/Dissociative Symptoms:   Patient denies any current psychotic symtpoms. Patient denies any AH or VH when withdrawing.    Attention/Behavioral Symptoms  Restless   Other Attention / Behavioral Symptoms:   Patient restless and continues to report that he wants to DC. Patient reports he wants to walk outside and get fresh air.    Cognitive Impairment  Orientation - Place  Orientation - Self  Orientation - Situation  Orientation - Time   Other Cognitive Impairment:    Mood and Adjustment  Anxious    Stress, Anxiety, Trauma, Any Recent Loss/Stressor  Other - See comment   Anxiety (frequency):   Patient reports he feels anxious at home. Patient feels anxious now and feels "stir crazy." RN reports that patient has received several doses of Ativan.   Phobia (specify):   N/A   Compulsive behavior (specify):   N/A   Obsessive behavior (specify):   N/A   Other:   Patient reports stressful relationship with ex-wfe.   Substance Abuse/Use  Current substance use  Substance abuse treatment needed   SBIRT completed (please refer for detailed history):  Y  Self-reported substance use:   Patient reports he can go 2-3 months at a time remaining sober but will have periods of binge drinking. Patient reports that he hurt his back about 1 week ago and was using  hydrocodone. When pills ran out, patient went to the bar to drink and eat dinner and reports he ended up at the ER. Patient reports he has been drinking about 36-48 beers daily for the past three days. Patient denies any further drug use.   Urinary Drug Screen Completed:  Y Alcohol level:   424    Environmental/Housing/Living Arrangement  Stable housing   Who is in the home:   Alone   Emergency contact:  C.C.-friend   Financial  IPRS   Patient's Strengths and Goals (patient's own words):    Patient reports he attends AA meetings daily. Patient reports that family in [place] are supportive.   Clinical Social Worker's Interpretive Summary:   CSW received referral to complete psychosocial assessment. CSW reviewed chart and met with patient at bedside. No family present but sitter in room. CSW introduced myself and explained role.    Patient reports he hurt his back and was taking pain medications. Patient reports when pain medication ran out he begun drinking and is not sure how he arrived to the ER. 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 Patient has a 55 year old son and reports a strong relationship with son. Patient reports limited informal support in the area but family is supportive who all live in [place]. Patient reports plan at DC is to go to [place] and live with his family. Patient feels that negative influences encourage him to drink and it is hard for him to stay sober. Although patient desires to live with family, patient has not contacted family in order to make arrangements at DC.    CSW and patient discussed patient's substance use and greater detail. Patient reports that he has been drinking for several years. Patient has been to several inpatient facilities but does not feel they are beneficial. Patient feels that most effective treatment is attending AA meetings on daily basis. Patient reports he has had two sponsors but one sponsor recently relapsed. Patient has been to therapy in the past and been for marriage counseling. Patient refuses all therapy, support groups or inpatient treatment at this time and feels that "it was a waste of time."    CSW and patient spoke about triggers to alcohol use and if any emotional connections were related to consumption. Patient reports that depression is situational and often when he feels frustrated he turns to alcohol. Patient has tried taking Lamictal and Seroquel about 6-7 years ago  but reports no current medications. Patient reports he would be open to discussing medication options with psych MD.    Patient restless throughout assessment and appears to minimize harmful affects of alcohol use. Patient describes use a cycle and feels that AA meetings are effective although he has had several relapses. Patient identified cycle of relapsing about every 2-3 months but struggles with identifying triggers or using alternative coping skills. Patient not receptive to other forms of treatment at this time. Patient denying any SI or HI and reports he wants to DC and does not acknowledge the dangers of alcohol withdrawal.    CSW will staff case with psych MD and will follow any recommendations provided.   Disposition:  Recommend Psych CSW continuing to support while in hospital

## 2013-02-10 NOTE — Progress Notes (Signed)
Nutrition Brief Note  Patient identified on the Malnutrition Screening Tool (MST) Report  Body mass index is 23.85 kg/(m^2). Patient meets criteria for normal weight based on current BMI. Pt denies any weight loss or decreased appetite.  Current diet order is regular, patient is consuming approximately 100% of meals at this time. Pt reports that he was eating well PTA and ate 3 meals yesterday but, this morning he has nausea and skipped breakfast. Pt was given medication for nausea and he plans on ordering lunch. Pt has no questions or concerns at this time. Labs and medications reviewed. Pt receiving a Multivitamin with minerals, Folvite, and thiamine daily.  No nutrition interventions warranted at this time. If nutrition issues arise, please consult RD.   Ian Malkin RD, LDN Inpatient Clinical Dietitian Pager: [phone] After Hours Pager: [phone]

## 2013-02-11 DIAGNOSIS — F10239 Alcohol dependence with withdrawal, unspecified: Secondary | ICD-10-CM

## 2013-02-11 LAB — CBC
MCH: 25.8 pg — ABNORMAL LOW (ref 26.0–34.0)
MCV: 78 fL (ref 78.0–100.0)
Platelets: 126 10*3/uL — ABNORMAL LOW (ref 150–400)
RBC: 5.23 MIL/uL (ref 4.22–5.81)
RDW: 13.6 % (ref 11.5–15.5)

## 2013-02-11 LAB — COMPREHENSIVE METABOLIC PANEL
AST: 50 U/L — ABNORMAL HIGH (ref 0–37)
BUN: 8 mg/dL (ref 6–23)
CO2: 27 mEq/L (ref 19–32)
Calcium: 8.8 mg/dL (ref 8.4–10.5)
Creatinine, Ser: 0.66 mg/dL (ref 0.50–1.35)
GFR calc Af Amer: >90 mL/min (ref >90)
GFR calc non Af Amer: >90 mL/min (ref >90)

## 2013-02-11 MED ORDER — FLEET ENEMA 7-19 GM/118ML RE ENEM
1.0000 | ENEMA | Freq: Once | RECTAL | Status: AC
  Administered 2013-02-11: 1 via RECTAL
  Filled 2013-02-11: qty 1

## 2013-02-11 MED ORDER — LORAZEPAM 2 MG/ML IJ SOLN
2.0000 mg | Freq: Once | INTRAMUSCULAR | Status: AC
  Administered 2013-02-11: 2 mg via INTRAVENOUS

## 2013-02-11 MED ORDER — ACETAMINOPHEN 325 MG PO TABS
650.0000 mg | ORAL_TABLET | Freq: Three times a day (TID) | ORAL | Status: DC | PRN
  Administered 2013-02-11: 650 mg via ORAL
  Filled 2013-02-11: qty 2

## 2013-02-11 MED ORDER — ZOLPIDEM TARTRATE 5 MG PO TABS
5.0000 mg | ORAL_TABLET | Freq: Once | ORAL | Status: AC
  Administered 2013-02-11: 5 mg via ORAL
  Filled 2013-02-11: qty 1

## 2013-02-11 NOTE — H&P (Deleted)
[facility] Progress Note   [name] JXB:[id number] DOB: 1970-12-11 DOA: 02/08/2013  Referring physician: ED physician PCP: [name], MD   Chief Complaint: alcohol withdrawal   HPI:  Pt is 42 yo male with history of alcohol abuse and alcohol withdrawal, presented to [facility] ED after having suicidal ideations, was markedly intoxicated on arrival and unable to provide history. Pt is more alert at the time of my visit but still unable to provide detail history and is relatively poor historian. TRH has been asked to admit for withdrawal and requested admission to SDU. Pt denies chest pain or shortness of breath, no specific abdominal or urinary concerns.   Assessment and Plan:  Alcohol withdrawal syndrome - improved this morning, patient insists that he be discharged home today, explained that he will need additional psychiatry evaluation today. Appreciate their input! - continue multivitamin, folic acid and thiamine - allow for regular diet, repeat CMET and CBC in AM - psych consulted, appreciate input. IVC and sitter    Alcohol abuse - psych consultation and social work consult to discuss cessation     Depression - may need admission to [facility] once medically cleared, defer that decision to psych. From medical standpoint patient has improved today.  - details on SI or HI were not very clear on admission; patient denies SI now     Transaminitis - secondary to alcohol abuse - improving  Code Status: Full Family Communication: Pt at bedside Disposition Plan: remain inpatient pending psych evaluation re dispo   Past Medical History  Diagnosis Date  . Alcohol abuse   . Anxiety   . Depression   . Bipolar 2 disorder   . Active smoker     Past Surgical History  Procedure Laterality Date  . Lymph node dissection      Social History:  reports that he has been smoking Cigarettes.  He has a 40 pack-year smoking history. He has never used smokeless tobacco. He  reports that he drinks about 151.2 ounces of alcohol per week. He reports that he uses illicit drugs (Marijuana).  No Known Allergies  Family History  Problem Relation Age of Onset  . Alcohol abuse Father     Prior to Admission medications   Not on File    Physical Exam: Filed Vitals:   02/10/13 2311 02/11/13 0000 02/11/13 0338 02/11/13 0400  BP:  139/76  129/83  Pulse:  86  84  Temp: 98 F (36.7 C)  98 F (36.7 C)   TempSrc: Oral  Oral   Resp:  22  14  Height:      Weight:      SpO2:  95%  95%   Physical Exam  Constitutional: Appears calm but tremulous, not in acute distress  HENT: Normocephalic. External right and left ear normal. Oropharynx is clear and moist.  Eyes: Conjunctivae and EOM are normal. PERRLA, no scleral icterus.  Neck: Normal ROM. Neck supple. No JVD. No tracheal deviation. No thyromegaly.  CVS: RRR, S1/S2 +, no murmurs, no gallops, no carotid bruit.  Pulmonary: Effort and breath sounds normal, no stridor, rhonchi, wheezes, rales.  Abdominal: Soft. BS +,  no distension, tenderness, rebound or guarding.  Musculoskeletal: Normal range of motion. No edema and no tenderness.  Lymphadenopathy: No lymphadenopathy noted, cervical, inguinal. Neuro: Alert. Difficulty with finger to nose and heal to sheen, follows commands appropriately, overall non focal  Skin: Skin is warm and dry. No rash noted. Not diaphoretic. No erythema. No pallor.  Psychiatric:  Appears calm at the time   Labs on Admission:  Basic Metabolic Panel:  Recent Labs Lab 02/08/13 2034 02/10/13 0607 02/11/13 0335  NA 143 139 138  K 3.7 4.1 3.5  CL 103 104 103  CO2 26 30 27   GLUCOSE 106* 87 84  BUN 7 11 8   CREATININE 0.63 0.71 0.66  CALCIUM 8.9 8.9 8.8   Liver Function Tests:  Recent Labs Lab 02/08/13 2034 02/10/13 0607 02/11/13 0335  AST 67* 55* 50*  ALT 57* 42 40  ALKPHOS 69 61 64  BILITOT 0.3 1.0 0.8  PROT 8.4* 6.5 6.7  ALBUMIN 4.2 3.2* 3.3*   CBC:  Recent Labs Lab  02/08/13 2034 02/10/13 0607 02/11/13 0335  WBC 6.3 5.8 6.3  HGB 16.1 13.5 13.5  HCT 45.9 40.6 40.8  MCV 77.1* 77.8* 78.0  PLT 189 123* 126*   EKG: Normal sinus rhythm, no ST/T wave changes  Pamella Pert, MD  [facility] Pager [phone]  If 7PM-7AM, please contact night-coverage [email or web address] Password [email or web address] 02/11/2013, 7:21 AM

## 2013-02-11 NOTE — Progress Notes (Addendum)
[facility] PROGRESS NOTE  [name] JYN:[id number] DOB: July 20, 1971 DOA: 02/08/2013 PCP: [name], MD  HPI: Pt is 42 yo male with history of alcohol abuse and alcohol withdrawal, presented to [facility] ED after having suicidal ideations, was markedly intoxicated on arrival and unable to provide history. Pt is more alert at the time of my visit but still unable to provide detail history and is relatively poor historian. TRH has been asked to admit for withdrawal and requested admission to SDU. Pt denies chest pain or shortness of breath, no specific abdominal or urinary concerns.   Assessment/Plan: Alcohol withdrawal syndrome  - improved this morning, patient insists that he be discharged home today, explained that he will need additional psychiatry evaluation today. Appreciate their input!  - continue multivitamin, folic acid and thiamine  - allow for regular diet, repeat CMET and CBC in AM  - psych consulted, appreciate input. IVC and sitter   Alcohol abuse  - psych consultation and social work consult to discuss cessation   Depression  - may need admission to [facility] once medically cleared, defer that decision to psych. From medical standpoint patient has improved today.  - details on SI or HI were not very clear on admission; patient denies SI now   Transaminitis  - secondary to alcohol abuse  - improving  Code Status: Full Family Communication: none  Disposition Plan: remain inpatient pending psych evaluation re dispo  Consultants:  Psychiatry  Procedures:  none  Antibiotics:  Anti-infectives   None     Antibiotics Given (last 72 hours)   None      HPI/Subjective: - no complaints, patient insists that he be discharged home today, counseled that he needs additional psychiatric evaluation  Objective: Filed Vitals:   02/11/13 0338 02/11/13 0400 02/11/13 0800 02/11/13 1200  BP:  129/83 119/72   Pulse:  84 75   Temp: 98 F (36.7 C)  97.7 F (36.5  C) 97.8 F (36.6 C)  TempSrc: Oral  Oral Oral  Resp:  14 16   Height:      Weight:      SpO2:  95% 95%     Intake/Output Summary (Last 24 hours) at 02/11/13 1352 Last data filed at 02/11/13 1240  Gross per 24 hour  Intake   2115 ml  Output   3475 ml  Net  -1360 ml   Filed Weights   02/09/13 1530 02/10/13 0344  Weight: 73.2 kg (161 lb 6 oz) 75.4 kg (166 lb 3.6 oz)    Exam:   General:  NAD, less tremulous since yesterday   Cardiovascular: regular rate and rhythm, without MRG  Respiratory: good air movement, clear to auscultation throughout, no wheezing, ronchi or rales  Abdomen: soft, not tender to palpation, positive bowel sounds  MSK: no peripheral edema  Neuro: CN 2-12 grossly intact, MS 5/5 in all 4  Data Reviewed: Basic Metabolic Panel:  Recent Labs Lab 02/08/13 2034 02/10/13 0607 02/11/13 0335  NA 143 139 138  K 3.7 4.1 3.5  CL 103 104 103  CO2 26 30 27   GLUCOSE 106* 87 84  BUN 7 11 8   CREATININE 0.63 0.71 0.66  CALCIUM 8.9 8.9 8.8   Liver Function Tests:  Recent Labs Lab 02/08/13 2034 02/10/13 0607 02/11/13 0335  AST 67* 55* 50*  ALT 57* 42 40  ALKPHOS 69 61 64  BILITOT 0.3 1.0 0.8  PROT 8.4* 6.5 6.7  ALBUMIN 4.2 3.2* 3.3*   CBC:  Recent Labs  Lab 02/08/13 2034 02/10/13 0607 02/11/13 0335  WBC 6.3 5.8 6.3  HGB 16.1 13.5 13.5  HCT 45.9 40.6 40.8  MCV 77.1* 77.8* 78.0  PLT 189 123* 126*    Recent Results (from the past 240 hour(s))  MRSA PCR SCREENING     Status: None   Collection Time    02/09/13  3:45 PM      Result Value Range Status   MRSA by PCR NEGATIVE  NEGATIVE Final   Comment:            The GeneXpert MRSA Assay (FDA     approved for NASAL specimens     only), is one component of a     comprehensive MRSA colonization     surveillance program. It is not     intended to diagnose MRSA     infection nor to guide or     monitor treatment for     MRSA infections.     Studies: No results found.  Scheduled  Meds: . enoxaparin (LOVENOX) injection  40 mg Subcutaneous Q24H  . multivitamin with minerals  1 tablet Oral Daily  . nicotine  21 mg Transdermal Daily  . thiamine  100 mg Oral Daily   Continuous Infusions: . sodium chloride 75 mL/hr at 02/11/13 0305    Principal Problem:   Alcohol withdrawal syndrome Active Problems:   Alcohol abuse   Depression   Time spent: 70  Pamella Pert, MD [facility] Pager [phone]. If 7 PM - 7 AM, please contact night-coverage at [email or web address], password [email or web address] 02/11/2013, 1:52 PM  LOS: 3 days

## 2013-02-12 DIAGNOSIS — F10229 Alcohol dependence with intoxication, unspecified: Secondary | ICD-10-CM

## 2013-02-12 MED ORDER — NICOTINE 21 MG/24HR TD PT24: 1.0000 | MEDICATED_PATCH | TRANSDERMAL | Status: DC

## 2013-02-12 MED ORDER — ADULT MULTIVITAMIN W/MINERALS CH: 1.0000 | ORAL_TABLET | Freq: Every day | ORAL | Status: DC

## 2013-02-12 NOTE — Progress Notes (Signed)
Patient provided discharge instructions. No follow up appointments needed or scheduled. Physician gave prescriptions to patient. IV discontinued. Patient verbalized understanding of discharge instructions.

## 2013-02-12 NOTE — Consult Note (Signed)
Reason for Consult: Alcohol intoxication, Alcohol dependence, Substance induced mood disorder, SI/HI Referring Physician: Dr. Lesia Hausen is an 42 y.o. male.  HPI: Patient has been free from alcohol withdrawal symptoms. Patient was able to complete detox protocol unable to take a shower eat well and sleep. Patient denies current symptoms for depression anxiety mania and psychosis. Patient denies recent onset ideation and has no evidence of psychotic symptoms. Patient has been contracting for safety and willing to follow up with outpatient psychiatric services and substance abuse treatment program. Patient is willing to participate in AA meetings and stay with a sponsor.   Reportedly patient friend has plans of removing alcohol from the house he has been living. Reportedly has been living with his friends house since he has been separated from his wife about 2 and half years ago because of his alcohol binge drinking. 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 He was  supported by his friends and some extended family members.  Patient minimized his alcohol abuse was dependence and refuse inpatient rehabilitation services.  Mental Status Examination: Patient appeared as per his stated age, casually dressed, and fairly groomed, and maintaining good eye contact. Patient has good mood and his affect was constricted. He has normal rate, rhythm, and volume of speech. His thought process is linear and goal directed. Patient has denied suicidal, homicidal ideations, intentions or plans. Patient has no evidence of auditory or visual hallucinations, delusions, and paranoia. Patient has fair to poor insight judgment and impulse control.  Past Medical History  Diagnosis Date  . Alcohol abuse   . Anxiety   . Depression   . Bipolar 2 disorder   . Active smoker     Past Surgical History  Procedure Laterality Date  . Lymph node  dissection      Family History  Problem Relation Age of Onset  . Alcohol abuse Father     Social History:  reports that he has been smoking Cigarettes.  He has a 40 pack-year smoking history. He has never used smokeless tobacco. He reports that he drinks about 151.2 ounces of alcohol per week. He reports that he uses illicit drugs (Marijuana).  Allergies: No Known Allergies  Medications: I have reviewed the patient's current medications.  Results for orders placed during the hospital encounter of 02/08/13 (from the past 48 hour(s))  CBC     Status: Abnormal   Collection Time    02/11/13  3:35 AM      Result Value Range   WBC 6.3  4.0 - 10.5 K/uL   RBC 5.23  4.22 - 5.81 MIL/uL   Hemoglobin 13.5  13.0 - 17.0 g/dL   HCT 40.9  81.1 - 91.4 %   MCV 78.0  78.0 - 100.0 fL   MCH 25.8 (*) 26.0 - 34.0 pg   MCHC 33.1  30.0 - 36.0 g/dL   RDW 78.2  95.6 - 21.3 %   Platelets 126 (*) 150 - 400 K/uL  COMPREHENSIVE METABOLIC PANEL     Status: Abnormal   Collection Time    02/11/13  3:35 AM      Result Value Range   Sodium 138  135 - 145 mEq/L   Potassium 3.5  3.5 - 5.1 mEq/L   Chloride 103  96 - 112 mEq/L   CO2 27  19 - 32 mEq/L   Glucose, Bld 84  70 - 99  mg/dL   BUN 8  6 - 23 mg/dL   Creatinine, Ser 1.61  0.50 - 1.35 mg/dL   Calcium 8.8  8.4 - 09.6 mg/dL   Total Protein 6.7  6.0 - 8.3 g/dL   Albumin 3.3 (*) 3.5 - 5.2 g/dL   AST 50 (*) 0 - 37 U/L   ALT 40  0 - 53 U/L   Alkaline Phosphatase 64  39 - 117 U/L   Total Bilirubin 0.8  0.3 - 1.2 mg/dL   GFR calc non Af Amer >90  >90 mL/min   GFR calc Af Amer >90  >90 mL/min   Comment:            The eGFR has been calculated     using the CKD EPI equation.     This calculation has not been     validated in all clinical     situations.     eGFR's persistently     <90 mL/min signify     possible Chronic Kidney Disease.    No results found.  Positive for anorexia, anxiety, bad mood, behavior problems, depression, excessive alcohol  consumption, separation anxiety, sleep disturbance and tobacco use Blood pressure 113/78, pulse 71, temperature 97.8 F (36.6 C), temperature source Oral, resp. rate 16, height 5\' 10"  (1.778 m), weight 166 lb 3.6 oz (75.4 kg), SpO2 97.00%.   Assessment/Plan: Alcohol intoxication Alcohol dependence Substance induced mood disorder  Recommendation:  1. Patient would be referred to the outpatient psychiatric services at [facility] in [place] and also [facility] at [place].   2. Patient cannot be commented to a substance abuse treatment programs as a rehabilitation treatment.  3. Patient has plans of starting antidepressant medication Effexor and a hoping he will receive a free medication from [facility].  4. Patient has plans of relocating to [place], where extended family members lives at this time.  5. Patient has plans of participating in AA meetings and stay with a sponsor staying sober.  6. Discontinue sitter and rescind involuntary commitment.   Jaeli Grubb,JANARDHAHA R. 02/12/2013, 12:15 PM

## 2013-02-12 NOTE — Progress Notes (Signed)
Asked by MD and psych MD to rescind Pt's IVC documents.  Faxed Examination and Recommendation, with box to rescind IVC checked, to [employer].  Received fax confirmation.  Placed these documents on Pt's chart.  Attempted to speak with Pt re: any potential needs.  Pt had already left the building.  Providence Crosby, LCSWA Clinical Social Work [phone]

## 2013-02-12 NOTE — Discharge Summary (Signed)
Physician Discharge Summary  [name] WJX:[id number] DOB: 01-Apr-1971 DOA: 02/08/2013  PCP: [name], MD   Admit date: 02/08/2013 Discharge date: 02/12/2013  Time spent: 45inutes  Recommendations for Outpatient Follow-up:  1. Please follow up with PCP next week post hospitalization.   Psychiatry recommendations: 1. Patient would be referred to the outpatient psychiatric services at [facility] in [place] and also [facility] at [place].  2. Patient cannot be commented to a substance abuse treatment programs as a rehabilitation treatment.  3. Patient has plans of starting antidepressant medication Effexor and a hoping he will receive a free medication from [facility].  4. Patient has plans of relocating to [place], where extended family members lives at this time.  5. Patient has plans of participating in AA meetings and stay with a sponsor staying sober.  6. Discontinue sitter and rescind involuntary commitment.   Discharge Diagnoses:  Principal Problem:   Alcohol withdrawal syndrome Active Problems:   Alcohol abuse   Depression  Discharge Condition: stable Diet recommendation: regular  Filed Weights   02/09/13 1530 02/10/13 0344  Weight: 73.2 kg (161 lb 6 oz) 75.4 kg (166 lb 3.6 oz)   History of present illness:  42 yo male with history of alcohol abuse and alcohol withdrawal, presented to [facility] ED after having suicidal ideations, was markedly intoxicated on arrival and unable to provide history. Pt is more alert at the time of my visit but still unable to provide detail history and is relatively poor historian. TRH has been asked to admit for withdrawal and requested admission to SDU. Pt denies chest pain or shortness of breath, no specific abdominal or urinary concerns.   Hospital Course:  Alcohol withdrawal syndrome  Placed on CIWA with Ativan initially, changed to Librium per psychiatry with improvement in patient symptoms and  had minimal withdrawal symptoms by his hospital day 3. Had long discussions with the patient about his depression and drinking and seems determined to seek long term help. He is genuinely concerned however afraid of a relapse. During his hospitalization he persistently denies SI or HI. Psychiatry followed patient while hospitalized. Has good follow up as outlined above.  Alcohol abuse - psych consultation and social work consult to discuss cessation  Depression - plans to start antidepressant as an outpatient, patient deferred to be given a prescrption.  Transaminitis - secondary to alcohol abuse, improving.  Procedures:  none   Consultations:  Psychiatry  Discharge Exam: Filed Vitals:   02/12/13 0000 02/12/13 0400 02/12/13 0800 02/12/13 1122  BP: 114/76 95/49 105/59 113/78  Pulse: 79 72 66 71  Temp: 98.4 F (36.9 C)  97.7 F (36.5 C) 97.8 F (36.6 C)  TempSrc: Oral  Oral Oral  Resp: 21 14 13 16   Height:      Weight:      SpO2: 93% 95% 93% 97%    General: NAD Cardiovascular: RRR Respiratory: CTA biL  Discharge Instructions     Medication List    TAKE these medications       multivitamin with minerals Tabs  Take 1 tablet by mouth daily.     nicotine 21 mg/24hr patch  Commonly known as:  NICODERM CQ - dosed in mg/24 hours  Place 1 patch onto the skin daily.        The results of significant diagnostics from this hospitalization (including imaging, microbiology, ancillary and laboratory) are listed below for reference.    Significant Diagnostic Studies: No results found.  Microbiology:  Recent Results (from the past 240 hour(s))  MRSA PCR SCREENING     Status: None   Collection Time    02/09/13  3:45 PM      Result Value Range Status   MRSA by PCR NEGATIVE  NEGATIVE Final   Comment:            The GeneXpert MRSA Assay (FDA     approved for NASAL specimens     only), is one component of a     comprehensive MRSA colonization     surveillance program. It  is not     intended to diagnose MRSA     infection nor to guide or     monitor treatment for     MRSA infections.     Labs: Basic Metabolic Panel:  Recent Labs Lab 02/08/13 2034 02/10/13 0607 02/11/13 0335  NA 143 139 138  K 3.7 4.1 3.5  CL 103 104 103  CO2 26 30 27   GLUCOSE 106* 87 84  BUN 7 11 8   CREATININE 0.63 0.71 0.66  CALCIUM 8.9 8.9 8.8   Liver Function Tests:  Recent Labs Lab 02/08/13 2034 02/10/13 0607 02/11/13 0335  AST 67* 55* 50*  ALT 57* 42 40  ALKPHOS 69 61 64  BILITOT 0.3 1.0 0.8  PROT 8.4* 6.5 6.7  ALBUMIN 4.2 3.2* 3.3*   CBC:  Recent Labs Lab 02/08/13 2034 02/10/13 0607 02/11/13 0335  WBC 6.3 5.8 6.3  HGB 16.1 13.5 13.5  HCT 45.9 40.6 40.8  MCV 77.1* 77.8* 78.0  PLT 189 123* 126*    Signed:  Lyriq Jarchow  [facility] 02/12/2013, 3:07 PM

## 2013-10-05 ENCOUNTER — Inpatient Hospital Stay (HOSPITAL_COMMUNITY)
Admission: EM | Admit: 2013-10-05 | Discharge: 2013-10-10 | DRG: 897 | Disposition: A | Discharge status: Home / Self Care | Payer: Medicaid - Out of State | Attending: Internal Medicine | Admitting: Internal Medicine

## 2013-10-05 ENCOUNTER — Encounter (HOSPITAL_COMMUNITY): Payer: Self-pay | Admitting: Emergency Medicine

## 2013-10-05 DIAGNOSIS — K922 Gastrointestinal hemorrhage, unspecified: Secondary | ICD-10-CM

## 2013-10-05 DIAGNOSIS — R45851 Suicidal ideations: Secondary | ICD-10-CM

## 2013-10-05 DIAGNOSIS — F3189 Other bipolar disorder: Secondary | ICD-10-CM | POA: Diagnosis present

## 2013-10-05 DIAGNOSIS — F10939 Alcohol use, unspecified with withdrawal, unspecified: Principal | ICD-10-CM

## 2013-10-05 DIAGNOSIS — F1020 Alcohol dependence, uncomplicated: Secondary | ICD-10-CM | POA: Diagnosis present

## 2013-10-05 DIAGNOSIS — F10239 Alcohol dependence with withdrawal, unspecified: Principal | ICD-10-CM

## 2013-10-05 DIAGNOSIS — K219 Gastro-esophageal reflux disease without esophagitis: Secondary | ICD-10-CM | POA: Diagnosis present

## 2013-10-05 DIAGNOSIS — F1210 Cannabis abuse, uncomplicated: Secondary | ICD-10-CM

## 2013-10-05 DIAGNOSIS — F411 Generalized anxiety disorder: Secondary | ICD-10-CM | POA: Diagnosis present

## 2013-10-05 DIAGNOSIS — Z23 Encounter for immunization: Secondary | ICD-10-CM

## 2013-10-05 DIAGNOSIS — F1010 Alcohol abuse, uncomplicated: Secondary | ICD-10-CM

## 2013-10-05 DIAGNOSIS — F329 Major depressive disorder, single episode, unspecified: Secondary | ICD-10-CM

## 2013-10-05 DIAGNOSIS — F17200 Nicotine dependence, unspecified, uncomplicated: Secondary | ICD-10-CM | POA: Diagnosis present

## 2013-10-05 DIAGNOSIS — F32A Depression, unspecified: Secondary | ICD-10-CM

## 2013-10-05 LAB — CBC
Hemoglobin: 14.2 g/dL (ref 13.0–17.0)
MCH: 28.3 pg (ref 26.0–34.0)
RBC: 5.01 MIL/uL (ref 4.22–5.81)
WBC: 6.4 10*3/uL (ref 4.0–10.5)

## 2013-10-05 LAB — COMPREHENSIVE METABOLIC PANEL
ALT: 53 U/L (ref 0–53)
AST: 47 U/L — ABNORMAL HIGH (ref 0–37)
Albumin: 4 g/dL (ref 3.5–5.2)
Alkaline Phosphatase: 66 U/L (ref 39–117)
BUN: 6 mg/dL (ref 6–23)
CO2: 27 mEq/L (ref 19–32)
Calcium: 8.7 mg/dL (ref 8.4–10.5)
Chloride: 102 mEq/L (ref 96–112)
Creatinine, Ser: 0.92 mg/dL (ref 0.50–1.35)
GFR calc Af Amer: >90 mL/min (ref >90)
GFR calc non Af Amer: >90 mL/min (ref >90)
Glucose, Bld: 107 mg/dL — ABNORMAL HIGH (ref 70–99)
Potassium: 4 mEq/L (ref 3.7–5.3)
Sodium: 145 mEq/L (ref 137–147)
Total Bilirubin: 0.5 mg/dL (ref 0.3–1.2)
Total Protein: 7.9 g/dL (ref 6.0–8.3)

## 2013-10-05 LAB — ETHANOL: Alcohol, Ethyl (B): 194 mg/dL — ABNORMAL HIGH (ref 0–11)

## 2013-10-05 LAB — RAPID URINE DRUG SCREEN, HOSP PERFORMED
Amphetamines: NOT DETECTED
Barbiturates: NOT DETECTED
Tetrahydrocannabinol: POSITIVE — AB

## 2013-10-05 MED ORDER — SODIUM CHLORIDE 0.9 % IV SOLN
1000.0000 mL | Freq: Once | INTRAVENOUS | Status: AC
  Administered 2013-10-05: 1000 mL via INTRAVENOUS

## 2013-10-05 MED ORDER — ENOXAPARIN SODIUM 40 MG/0.4ML ~~LOC~~ SOLN
40.0000 mg | SUBCUTANEOUS | Status: DC
  Administered 2013-10-05: 40 mg via SUBCUTANEOUS
  Filled 2013-10-05 (×2): qty 0.4

## 2013-10-05 MED ORDER — VITAMIN B-1 100 MG PO TABS: 100.0000 mg | ORAL_TABLET | Freq: Every day | ORAL | Status: DC

## 2013-10-05 MED ORDER — THIAMINE HCL 100 MG/ML IJ SOLN
100.0000 mg | Freq: Every day | INTRAMUSCULAR | Status: DC
  Administered 2013-10-05: 100 mg via INTRAVENOUS
  Filled 2013-10-05: qty 2

## 2013-10-05 MED ORDER — LORAZEPAM 2 MG/ML IJ SOLN
2.0000 mg | Freq: Once | INTRAMUSCULAR | Status: AC
  Administered 2013-10-05: 2 mg via INTRAVENOUS
  Filled 2013-10-05: qty 1

## 2013-10-05 MED ORDER — LORAZEPAM 2 MG/ML IJ SOLN
2.0000 mg | INTRAMUSCULAR | Status: DC | PRN
  Administered 2013-10-05: 3 mg via INTRAVENOUS
  Administered 2013-10-06 – 2013-10-07 (×7): 2 mg via INTRAVENOUS
  Administered 2013-10-07 (×4): 3 mg via INTRAVENOUS
  Administered 2013-10-08 (×4): 2 mg via INTRAVENOUS
  Administered 2013-10-08: 3 mg via INTRAVENOUS
  Administered 2013-10-09 (×3): 2 mg via INTRAVENOUS
  Filled 2013-10-05: qty 2
  Filled 2013-10-05: qty 1
  Filled 2013-10-05: qty 2
  Filled 2013-10-05: qty 1
  Filled 2013-10-05 (×2): qty 2
  Filled 2013-10-05 (×8): qty 1
  Filled 2013-10-05 (×2): qty 2
  Filled 2013-10-05: qty 1

## 2013-10-05 MED ORDER — SODIUM CHLORIDE 0.9 % IV SOLN
1000.0000 mL | INTRAVENOUS | Status: DC
  Administered 2013-10-05: 1000 mL via INTRAVENOUS

## 2013-10-05 MED ORDER — FOLIC ACID 1 MG PO TABS
1.0000 mg | ORAL_TABLET | Freq: Every day | ORAL | Status: DC
  Administered 2013-10-06 – 2013-10-10 (×5): 1 mg via ORAL
  Filled 2013-10-05 (×5): qty 1

## 2013-10-05 MED ORDER — CHLORDIAZEPOXIDE HCL 10 MG PO CAPS
10.0000 mg | ORAL_CAPSULE | Freq: Four times a day (QID) | ORAL | Status: DC
  Administered 2013-10-05: 10 mg via ORAL
  Filled 2013-10-05 (×2): qty 1

## 2013-10-05 MED ORDER — ADULT MULTIVITAMIN W/MINERALS CH
1.0000 | ORAL_TABLET | Freq: Every day | ORAL | Status: DC
  Administered 2013-10-06 – 2013-10-10 (×5): 1 via ORAL
  Filled 2013-10-05 (×5): qty 1

## 2013-10-05 MED ORDER — THIAMINE HCL 100 MG/ML IJ SOLN
100.0000 mg | Freq: Every day | INTRAMUSCULAR | Status: DC
  Administered 2013-10-05 – 2013-10-06 (×2): 100 mg via INTRAVENOUS
  Filled 2013-10-05: qty 2
  Filled 2013-10-05: qty 1

## 2013-10-05 MED ORDER — LORAZEPAM 1 MG PO TABS: 0.0000 mg | ORAL_TABLET | Freq: Four times a day (QID) | ORAL | Status: DC

## 2013-10-05 MED ORDER — SODIUM CHLORIDE 0.9 % IV SOLN
INTRAVENOUS | Status: DC
  Administered 2013-10-05 – 2013-10-07 (×2): via INTRAVENOUS

## 2013-10-05 MED ORDER — ONDANSETRON HCL 4 MG/2ML IJ SOLN
4.0000 mg | Freq: Four times a day (QID) | INTRAMUSCULAR | Status: DC | PRN
  Administered 2013-10-06: 4 mg via INTRAVENOUS
  Filled 2013-10-05: qty 2

## 2013-10-05 MED ORDER — SODIUM CHLORIDE 0.9 % IJ SOLN
3.0000 mL | Freq: Two times a day (BID) | INTRAMUSCULAR | Status: DC
Start: 2013-10-05 — End: 2013-10-10
  Administered 2013-10-06 – 2013-10-10 (×9): 3 mL via INTRAVENOUS

## 2013-10-05 MED ORDER — ONDANSETRON HCL 4 MG PO TABS: 4.0000 mg | ORAL_TABLET | Freq: Four times a day (QID) | ORAL | Status: DC | PRN

## 2013-10-05 MED ORDER — PNEUMOCOCCAL VAC POLYVALENT 25 MCG/0.5ML IJ INJ
0.5000 mL | INJECTION | INTRAMUSCULAR | Status: AC
  Administered 2013-10-06: 0.5 mL via INTRAMUSCULAR
  Filled 2013-10-05: qty 0.5

## 2013-10-05 MED ORDER — LORAZEPAM 1 MG PO TABS: 0.0000 mg | ORAL_TABLET | Freq: Two times a day (BID) | ORAL | Status: DC

## 2013-10-05 MED ORDER — THIAMINE HCL 100 MG/ML IJ SOLN
Freq: Once | INTRAVENOUS | Status: AC
  Administered 2013-10-05: 21:00:00 via INTRAVENOUS
  Filled 2013-10-05: qty 1000

## 2013-10-05 NOTE — H&P (Signed)
[facility] History and Physical  Patient: [name]  NWG:[id number]  DOB: 09-19-1971  DOS: the patient was seen and examined on 10/05/2013 PCP: No PCP Per Patient  Chief Complaint: Tremors  HPI: [name] is a 42 y.o. male with Past medical history of alcohol abuse, depression, bipolar disorder. The patient is coming from home. Patient presents with complaints of tremors. He mentions that he was trying to get himself by drinking To much alcohol. Over Saturday Sunday and Monday he mentions he must or drank nearly 30-60 beers and some [place] whiskey. At his baseline and regular day he would at least drinks 6 cans of beer which can go up to 18 and 1-2 [place] whiskey. Tuesday night he woke up in the middle of the night as he was cold and clammy and he drank 2 beers. This morning he drank one beer when he woke up.and since he continues to have tremors and shakiness which are not getting better it had another beer in the afternoon.despite that his symptoms were not getting better and he was worried about seizure and therefore he came to the hospital. He denies any complaints of seizure hallucination incontinence of bowel or bladder or trauma. He mentions he has been smoking marijuana, the last time he smoked was 2 weeks ago.  he denies use of any other recreational drugs or any other prescription medication. He was supposed to take Lamictal and seroquel but is not taking it since last few months.  at present he denies any suicidal or homicidal ideation.  Review of Systems: as mentioned in the history of present illness.  A Comprehensive review of the other systems is negative.  Past Medical History  Diagnosis Date  . Alcohol abuse   . Anxiety   . Depression   . Bipolar 2 disorder   . Active smoker    Past Surgical History  Procedure Laterality Date  . Lymph node dissection     Social History:  reports that he has been smoking Cigarettes.  He has a 40 pack-year  smoking history. He has never used smokeless tobacco. He reports that he drinks about 151.2 ounces of alcohol per week. He reports that he uses illicit drugs (Marijuana). Independent for most of his  ADL.  No Known Allergies  Family History  Problem Relation Age of Onset  . Alcohol abuse Father     Prior to Admission medications   Not on File    Physical Exam: Filed Vitals:   10/05/13 1856 10/05/13 1858 10/05/13 1910 10/05/13 1924  BP: 134/80 116/62 116/62 124/68  Pulse: 112 115 110 106  Temp:      TempSrc:      Resp: 16  19 24   Weight:      SpO2: 98%  95% 95%    General: Alert, Awake and Oriented to Time, Place and Person. Appear in moderate distress Eyes: PERRL ENT: Oral Mucosa clear dry. Neck: no JVD Cardiovascular: S1 and S2 Present, no Murmur, Peripheral Pulses Present Respiratory: Bilateral Air entry equal and Decreased, Clear to Auscultation,  no Crackles,no wheezes Abdomen: Bowel Sound Present, Soft and Non tender Skin: no Rash Extremities: no Pedal edema, no calf tenderness Neurologic: Grossly Unremarkable.  Labs on Admission:  CBC:  Recent Labs Lab 10/05/13 1655  WBC 6.4  HGB 14.2  HCT 40.8  MCV 81.4  PLT 275    CMP     Component Value Date/Time   NA 145 10/05/2013 1655   K 4.0 10/05/2013  1655   CL 102 10/05/2013 1655   CO2 27 10/05/2013 1655   GLUCOSE 107* 10/05/2013 1655   BUN 6 10/05/2013 1655   CREATININE 0.92 10/05/2013 1655   CALCIUM 8.7 10/05/2013 1655   PROT 7.9 10/05/2013 1655   ALBUMIN 4.0 10/05/2013 1655   AST 47* 10/05/2013 1655   ALT 53 10/05/2013 1655   ALKPHOS 66 10/05/2013 1655   BILITOT 0.5 10/05/2013 1655   GFRNONAA >90 10/05/2013 1655   GFRAA >90 10/05/2013 1655    No results found for this basename: LIPASE, AMYLASE,  in the last 168 hours No results found for this basename: AMMONIA,  in the last 168 hours  No results found for this basename: CKTOTAL, CKMB, CKMBINDEX, TROPONINI,  in the last 168 hours BNP (last 3  results) No results found for this basename: PROBNP,  in the last 8760 hours  Radiological Exams on Admission: No results found.  EKG: Independently reviewed. Sinus tachycardia   Assessment/Plan Principal Problem:   Alcohol withdrawal Active Problems:   Suicidal ideation   1. Alcohol withdrawal The patient is presenting with complaints of alcohol withdrawal. He appears to significant shakiness and tremors but does not have any hallucination/delusion or seizure-like activity. He does is surrounding and is well aware of his current condition. With this she will be admitted to step down unit. He will be placed on CIWA protocol. He will be given librium 10 mg QID AS WELL as Ativan as needed for worsening agitation. IV Zofran as needed for nausea  2. GERD IV Protonix  3. suicide ideation Psychiatric consult in the morning and bedside sitter.  4.Maj. depression   psychiatric consult for further workup.  DVT Prophylaxis: subcutaneous Heparin Nutrition: Clear liquid diet   Code Status: Full   Disposition: Admitted to inpatient in step-down unit.  Author: Lynden Oxford, MD [facility] Pager: [phone] 10/05/2013, 7:56 PM    If 7PM-7AM, please contact night-coverage [email or web address] Password [email or web address]

## 2013-10-05 NOTE — Plan of Care (Signed)
Problem: Phase I Progression Outcomes Goal: Pain controlled with appropriate interventions Outcome: Progressing Complains of headache at times currently is pain free Goal: Voiding-avoid urinary catheter unless indicated Outcome: Progressing awaiting first void Goal: Hemodynamically stable Outcome: Progressing VSS will continue to monitor

## 2013-10-05 NOTE — ED Notes (Addendum)
Reports heavy etoh use for the past 5 days. Last drank 1 hour ago but having shaking, muscle spasms, n/v. Also reports "no longer wanting to live."

## 2013-10-05 NOTE — ED Provider Notes (Signed)
CSN: [id number]     Arrival date & time 10/05/13  1615 History   First MD Initiated Contact with Patient 10/05/13 1629     Chief Complaint  Patient presents with  . Withdrawal    HPI Is reports that he normally drinks a beer throughout the day.  His last drink was around 7 or 8 this morning.  He tried to stop drinking on his own and as the day went on he is become increasingly "shaky".  He tried to drink 1 beer an hour ago to see if this would help with the shakes and it did not and therefore he came to the ER for evaluation.  No hallucinations.  No seizures.  Patient with several missions to the hospital for similar symptoms   Past Medical History  Diagnosis Date  . Alcohol abuse   . Anxiety   . Depression   . Bipolar 2 disorder   . Active smoker    Past Surgical History  Procedure Laterality Date  . Lymph node dissection     Family History  Problem Relation Age of Onset  . Alcohol abuse Father    History  Substance Use Topics  . Smoking status: Current Every Day Smoker -- 2.00 packs/day for 20 years    Types: Cigarettes  . Smokeless tobacco: Never Used  . Alcohol Use: 151.2 oz/week    252 Cans of beer per week     Comment: heavy     Review of Systems  All other systems reviewed and are negative.    Allergies  Review of patient's allergies indicates no known allergies.  Home Medications  No current outpatient prescriptions on file. BP 138/91  Pulse 110  Temp(Src) 98.1 F (36.7 C) (Oral)  Resp 18  Wt 171 lb 3 oz (77.65 kg)  SpO2 96% Physical Exam  Nursing note and vitals reviewed. Constitutional: He is oriented to person, place, and time. He appears well-developed and well-nourished.  HENT:  Head: Normocephalic and atraumatic.  Eyes: EOM are normal.  Neck: Normal range of motion.  Cardiovascular: Normal rate, regular rhythm, normal heart sounds and intact distal pulses.   Pulmonary/Chest: Effort normal and breath sounds normal. No respiratory distress.   Abdominal: Soft. He exhibits no distension. There is no tenderness.  Musculoskeletal: Normal range of motion.  Neurological: He is alert and oriented to person, place, and time.  Skin: Skin is warm and dry.  Psychiatric: He has a normal mood and affect. Judgment normal.    ED Course  Procedures (including critical care time)  CRITICAL CARE Performed by: Lyanne Co Total critical care time: 32 Critical care time was exclusive of separately billable procedures and treating other patients. Critical care was necessary to treat or prevent imminent or life-threatening deterioration. Critical care was time spent personally by me on the following activities: development of treatment plan with patient and/or surrogate as well as nursing, discussions with consultants, evaluation of patient's response to treatment, examination of patient, obtaining history from patient or surrogate, ordering and performing treatments and interventions, ordering and review of laboratory studies, ordering and review of radiographic studies, pulse oximetry and re-evaluation of patient's condition.   Labs Review Labs Reviewed  COMPREHENSIVE METABOLIC PANEL - Abnormal; Notable for the following:    Glucose, Bld 107 (*)    AST 47 (*)    All other components within normal limits  ETHANOL - Abnormal; Notable for the following:    Alcohol, Ethyl (B) 194 (*)    All other  components within normal limits  SALICYLATE LEVEL - Abnormal; Notable for the following:    Salicylate Lvl <2.0 (*)    All other components within normal limits  URINE RAPID DRUG SCREEN (HOSP PERFORMED) - Abnormal; Notable for the following:    Tetrahydrocannabinol POSITIVE (*)    All other components within normal limits  CBC   Imaging Review No results found.  EKG Interpretation   None       MDM   1. Alcohol withdrawal    The patient will be admitted to the hospital for alcohol withdrawal.  Ativan given. CIWA 12. Step  down    Lyanne Co, MD 10/05/13 [phone]

## 2013-10-05 NOTE — ED Notes (Signed)
Ron, [facility] notified of [facility] sitter need.  Security called to wand pt.

## 2013-10-06 DIAGNOSIS — F1210 Cannabis abuse, uncomplicated: Secondary | ICD-10-CM | POA: Diagnosis present

## 2013-10-06 DIAGNOSIS — K922 Gastrointestinal hemorrhage, unspecified: Secondary | ICD-10-CM

## 2013-10-06 LAB — COMPREHENSIVE METABOLIC PANEL
ALT: 36 U/L (ref 0–53)
AST: 28 U/L (ref 0–37)
Albumin: 3 g/dL — ABNORMAL LOW (ref 3.5–5.2)
Alkaline Phosphatase: 49 U/L (ref 39–117)
BUN: 8 mg/dL (ref 6–23)
CO2: 27 mEq/L (ref 19–32)
Calcium: 7.6 mg/dL — ABNORMAL LOW (ref 8.4–10.5)
Chloride: 109 mEq/L (ref 96–112)
Creatinine, Ser: 0.82 mg/dL (ref 0.50–1.35)
GFR calc Af Amer: >90 mL/min (ref >90)
GFR calc non Af Amer: >90 mL/min (ref >90)
Glucose, Bld: 81 mg/dL (ref 70–99)
Potassium: 3.8 mEq/L (ref 3.7–5.3)
Sodium: 146 mEq/L (ref 137–147)
Total Bilirubin: 0.8 mg/dL (ref 0.3–1.2)
Total Protein: 5.8 g/dL — ABNORMAL LOW (ref 6.0–8.3)

## 2013-10-06 LAB — CBC
HCT: 36.9 % — ABNORMAL LOW (ref 39.0–52.0)
Hemoglobin: 12.1 g/dL — ABNORMAL LOW (ref 13.0–17.0)
MCH: 27.3 pg (ref 26.0–34.0)
MCHC: 32.8 g/dL (ref 30.0–36.0)
MCV: 83.1 fL (ref 78.0–100.0)
Platelets: 235 10*3/uL (ref 150–400)
RBC: 4.44 MIL/uL (ref 4.22–5.81)
RDW: 15 % (ref 11.5–15.5)
WBC: 5 10*3/uL (ref 4.0–10.5)

## 2013-10-06 LAB — GLUCOSE, CAPILLARY
GLUCOSE-CAPILLARY: 74 mg/dL (ref 70–99)
Glucose-Capillary: 122 mg/dL — ABNORMAL HIGH (ref 70–99)

## 2013-10-06 LAB — ETHANOL: Alcohol, Ethyl (B): <11 mg/dL (ref 0–11)

## 2013-10-06 LAB — PROTIME-INR
INR: 1.01 (ref 0.00–1.49)
Prothrombin Time: 13.1 seconds (ref 11.6–15.2)

## 2013-10-06 LAB — MRSA PCR SCREENING: MRSA by PCR: NEGATIVE

## 2013-10-06 MED ORDER — PANTOPRAZOLE SODIUM 40 MG IV SOLR
40.0000 mg | INTRAVENOUS | Status: DC
  Administered 2013-10-06: 40 mg via INTRAVENOUS
  Filled 2013-10-06 (×2): qty 40

## 2013-10-06 MED ORDER — LORAZEPAM 2 MG/ML IJ SOLN
2.0000 mg | INTRAMUSCULAR | Status: DC
  Administered 2013-10-06 – 2013-10-09 (×15): 2 mg via INTRAVENOUS
  Filled 2013-10-06 (×19): qty 1

## 2013-10-06 MED ORDER — VITAMIN B-1 100 MG PO TABS
100.0000 mg | ORAL_TABLET | Freq: Every day | ORAL | Status: DC
  Administered 2013-10-07 – 2013-10-10 (×4): 100 mg via ORAL
  Filled 2013-10-06 (×4): qty 1

## 2013-10-06 MED ORDER — CHLORDIAZEPOXIDE HCL 5 MG PO CAPS
25.0000 mg | ORAL_CAPSULE | Freq: Four times a day (QID) | ORAL | Status: DC
  Administered 2013-10-06 – 2013-10-10 (×16): 25 mg via ORAL
  Filled 2013-10-06: qty 5
  Filled 2013-10-06 (×2): qty 1
  Filled 2013-10-06: qty 5
  Filled 2013-10-06 (×5): qty 1
  Filled 2013-10-06: qty 5
  Filled 2013-10-06 (×4): qty 1
  Filled 2013-10-06: qty 5
  Filled 2013-10-06: qty 1

## 2013-10-06 MED ORDER — INFLUENZA VAC SPLIT QUAD 0.5 ML IM SUSP
0.5000 mL | INTRAMUSCULAR | Status: DC
  Filled 2013-10-06: qty 0.5

## 2013-10-06 MED ORDER — PANTOPRAZOLE SODIUM 40 MG PO TBEC
40.0000 mg | DELAYED_RELEASE_TABLET | Freq: Every day | ORAL | Status: DC
  Administered 2013-10-07 – 2013-10-10 (×4): 40 mg via ORAL
  Filled 2013-10-06 (×4): qty 1

## 2013-10-06 NOTE — Progress Notes (Addendum)
[facility] Progress Note [name] TEAM 1 - Stepdown/ICU TEAM   JAZ MALLICK WUJ:[id number] DOB: [birth year] DOA: 10/05/2013 PCP: No PCP Per Patient    Brief narrative: 43 y/o male with h/o long term alcohol abuse presented with ongoing alcohol abuse and tremors. Pt was admitted to a hospital recently for ETOH withdrawal, remained sober for a few days after being discharged and then began to drink again. He states he was essentially drinking beer constantly while laying in bed for the past 5 days. He lives alone. He began to feel weak and have tremors and called his AA sponser who brought him to the hospital .   Subjective: Still feels very anxious- believes he was having visual hallucinations at home. Had been vomiting daily in the mornings. Does not admit to vomiting blood but noted black stool about a week or two ago which lasted a few days. He often has heartburn and indigestion but rarely has abdominal pain. He confirms to me that he is not suicidal at this time and has not been suicidal at home.  Assessment/Plan: Principal Problem:   Alcohol abuse and withdrawal - increase Librium from 10 to 25 mg TID - Liberal Ativan use to control withdrawal symptoms - add routine Ativan at 2 mg every 4 hrs in addition to PRN Ativan - very high risk for seizures- seizure and fall precautions   Active Problems:    GI bleed- probable - suspect alcoholic gastritis  - hemoccult stool - start Protonix IV - follow Hgb with hydration - d/c Lovenox   Vomiting - resolved - advance to regular diet  Mariajuana abuse  Depression - has seen multiple different psychiatrists and has been on and off of Lamictal and Seroquel for past 6-8 months - he has not taken them in over a month  Code Status: Full code Family Communication: none Disposition Plan: follow in SDU  Consultants: none  Procedures: none  Antibiotics: none  DVT prophylaxis: SCDs- d/c  Lovenox  Objective: Blood pressure 129/75, pulse 102, temperature 98.1 F (36.7 C), temperature source Oral, resp. rate 24, height 5\' 9"  (1.753 m), weight 69.9 kg (154 lb 1.6 oz), SpO2 96.00%.  Intake/Output Summary (Last 24 hours) at 10/06/13 1551 Last data filed at 10/06/13 1500  Gross per 24 hour  Intake   2853 ml  Output   1651 ml  Net   1202 ml     Exam: General: No acute respiratory distress- anxious appearing with tremulous hands- AAO  X 3 Lungs: Clear to auscultation bilaterally without wheezes or crackles Cardiovascular: Regular rate and rhythm without murmur- HR 90-100 Abdomen: Nontender, nondistended, soft, bowel sounds positive, no rebound, no ascites, no appreciable mass Extremities: No significant cyanosis, clubbing, or edema bilateral lower extremities  Data Reviewed: Basic Metabolic Panel:  Recent Labs Lab 10/05/13 1655 10/06/13 0448  NA 145 146  K 4.0 3.8  CL 102 109  CO2 27 27  GLUCOSE 107* 81  BUN 6 8  CREATININE 0.92 0.82  CALCIUM 8.7 7.6*   Liver Function Tests:  Recent Labs Lab 10/05/13 1655 10/06/13 0448  AST 47* 28  ALT 53 36  ALKPHOS 66 49  BILITOT 0.5 0.8  PROT 7.9 5.8*  ALBUMIN 4.0 3.0*   No results found for this basename: LIPASE, AMYLASE,  in the last 168 hours No results found for this basename: AMMONIA,  in the last 168 hours CBC:  Recent Labs Lab 10/05/13 1655 10/06/13 0448  WBC 6.4 5.0  HGB 14.2  12.1*  HCT 40.8 36.9*  MCV 81.4 83.1  PLT 275 235   Cardiac Enzymes: No results found for this basename: CKTOTAL, CKMB, CKMBINDEX, TROPONINI,  in the last 168 hours BNP (last 3 results) No results found for this basename: PROBNP,  in the last 8760 hours CBG:  Recent Labs Lab 10/05/13 2340 10/06/13 0639  GLUCAP 122* 74    Recent Results (from the past 240 hour(s))  MRSA PCR SCREENING     Status: None   Collection Time    10/06/13  6:15 AM      Result Value Range Status   MRSA by PCR NEGATIVE  NEGATIVE Final    Comment:            The GeneXpert MRSA Assay (FDA     approved for NASAL specimens     only), is one component of a     comprehensive MRSA colonization     surveillance program. It is not     intended to diagnose MRSA     infection nor to guide or     monitor treatment for     MRSA infections.     Studies:  Recent x-ray studies have been reviewed in detail by the Attending Physician  Scheduled Meds:  Scheduled Meds: . chlordiazePOXIDE  25 mg Oral QID  . enoxaparin (LOVENOX) injection  40 mg Subcutaneous Q24H  . folic acid  1 mg Oral Daily  . [START ON 10/07/2013] influenza vac split quadrivalent PF  0.5 mL Intramuscular Tomorrow-1000  . multivitamin with minerals  1 tablet Oral Daily  . [START ON 10/07/2013] pantoprazole  40 mg Oral Daily  . sodium chloride  3 mL Intravenous Q12H  . [START ON 10/07/2013] thiamine  100 mg Oral Daily   Continuous Infusions: . sodium chloride 125 mL/hr at 10/05/13 2247    Time spent on care of this patient: 35 min   [name],Jaskirat Schwieger, MD  [facility] Office  [phone] Pager - Text Page per Loretha StaplerAmion as per below:  On-Call/Text Page:      [email or web address]      password [email or web address]  If 7PM-7AM, please contact night-coverage [email or web address] Password [email or web address] 10/06/2013, 3:51 PM   LOS: 1 day

## 2013-10-07 MED ORDER — LORAZEPAM 2 MG/ML IJ SOLN
3.0000 mg | Freq: Once | INTRAMUSCULAR | Status: AC
  Filled 2013-10-07: qty 2

## 2013-10-07 MED ORDER — TRAZODONE HCL 50 MG PO TABS
50.0000 mg | ORAL_TABLET | Freq: Once | ORAL | Status: AC
  Administered 2013-10-07: 50 mg via ORAL
  Filled 2013-10-07: qty 1

## 2013-10-07 NOTE — Progress Notes (Signed)
Utilization Review Completed.Damien Batty T1/11/2013  

## 2013-10-07 NOTE — Progress Notes (Addendum)
[facility] Progress Note [place] TEAM 1 - Stepdown/ICU TEAM   Ray ChurchMichael C Mackel KGM:[id number] DOB: [birth year] DOA: 10/05/2013 PCP: No PCP Per Patient    Brief narrative: 43 y/o male with h/o long term alcohol abuse presented with ongoing alcohol abuse and tremors. Pt was admitted to a hospital recently for ETOH withdrawal, remained sober for a few days after being discharged and then began to drink again. He states he was essentially drinking beer constantly while laying in bed for the past 5 days. He lives alone. He began to feel weak and have tremors and called his AA sponser who brought him to the hospital .   Subjective: Still feels very anxious- no other complaints.  Assessment/Plan: Principal Problem:   Alcohol abuse and withdrawal - increased Librium from 10 to 25 mg TID - add routine Ativan at 2 mg every 4 hrs in addition to PRN Ativan - very high risk for seizures- seizure and fall precautions - withdrawal quite sever today with CIWA > 20- advise RN to give liberal Ativan (CIWA is Q1 hr)    Active Problems:    GI bleed- probable - suspect alcoholic gastritis  - hemoccult stool - started Protonix  - follow Hgb with hydration - d/c Lovenox   Vomiting - resolved - advanced to regular diet  Mariajuana abuse  Depression - has seen multiple different psychiatrists and has been on and off of Lamictal and Seroquel for past 6-8 months - he has not taken them in over a month  Code Status: Full code Family Communication: none Disposition Plan: follow in SDU  Consultants: none  Procedures: none  Antibiotics: none  DVT prophylaxis: SCDs- d/c Lovenox  Objective: Blood pressure 123/64, pulse 80, temperature 97.5 F (36.4 C), temperature source Oral, resp. rate 19, height 5\' 9"  (1.753 m), weight 71.1 kg (156 lb 12 oz), SpO2 96.00%.  Intake/Output Summary (Last 24 hours) at 10/07/13 1408 Last data filed at 10/07/13 1300  Gross per 24 hour  Intake    3100 ml  Output   3000 ml  Net    100 ml     Exam: General: No acute respiratory distress- anxious appearing with tremulous hands- AAO  X 3 Lungs: Clear to auscultation bilaterally without wheezes or crackles Cardiovascular: Regular rate and rhythm without murmur- HR 90-100 Abdomen: Nontender, nondistended, soft, bowel sounds positive, no rebound, no ascites, no appreciable mass Extremities: No significant cyanosis, clubbing, or edema bilateral lower extremities  Data Reviewed: Basic Metabolic Panel:  Recent Labs Lab 10/05/13 1655 10/06/13 0448  NA 145 146  K 4.0 3.8  CL 102 109  CO2 27 27  GLUCOSE 107* 81  BUN 6 8  CREATININE 0.92 0.82  CALCIUM 8.7 7.6*   Liver Function Tests:  Recent Labs Lab 10/05/13 1655 10/06/13 0448  AST 47* 28  ALT 53 36  ALKPHOS 66 49  BILITOT 0.5 0.8  PROT 7.9 5.8*  ALBUMIN 4.0 3.0*   No results found for this basename: LIPASE, AMYLASE,  in the last 168 hours No results found for this basename: AMMONIA,  in the last 168 hours CBC:  Recent Labs Lab 10/05/13 1655 10/06/13 0448  WBC 6.4 5.0  HGB 14.2 12.1*  HCT 40.8 36.9*  MCV 81.4 83.1  PLT 275 235   Cardiac Enzymes: No results found for this basename: CKTOTAL, CKMB, CKMBINDEX, TROPONINI,  in the last 168 hours BNP (last 3 results) No results found for this basename: PROBNP,  in the last 8760 hours  CBG:  Recent Labs Lab 10/05/13 2340 10/06/13 0639  GLUCAP 122* 74    Recent Results (from the past 240 hour(s))  MRSA PCR SCREENING     Status: None   Collection Time    10/06/13  6:15 AM      Result Value Range Status   MRSA by PCR NEGATIVE  NEGATIVE Final   Comment:            The GeneXpert MRSA Assay (FDA     approved for NASAL specimens     only), is one component of a     comprehensive MRSA colonization     surveillance program. It is not     intended to diagnose MRSA     infection nor to guide or     monitor treatment for     MRSA infections.      Studies:  Recent x-ray studies have been reviewed in detail by the Attending Physician  Scheduled Meds:  Scheduled Meds: . chlordiazePOXIDE  25 mg Oral QID  . folic acid  1 mg Oral Daily  . influenza vac split quadrivalent PF  0.5 mL Intramuscular Tomorrow-1000  . LORazepam  2 mg Intravenous Q4H  . multivitamin with minerals  1 tablet Oral Daily  . pantoprazole  40 mg Oral Daily  . sodium chloride  3 mL Intravenous Q12H  . thiamine  100 mg Oral Daily   Continuous Infusions: . sodium chloride 125 mL/hr at 10/07/13 1000    Time spent on care of this patient: 35 min   Rosezetta Balderston, MD  [facility] Office  [phone] Pager - Text Page per Loretha Stapler as per below:  On-Call/Text Page:      [email or web address]      password [email or web address]  If 7PM-7AM, please contact night-coverage [email or web address] Password [email or web address] 10/07/2013, 2:08 PM   LOS: 2 days

## 2013-10-07 NOTE — Progress Notes (Signed)
Pt has received 18 ml of Ativan which includes shceduled and prn, pt's CIWA has not been less than 20 throughout day, pt obs with tremors and states not feeling well, pt request ativan q hr and states it is not helping, pt also request something to sleep, MD/N, nursing will cont to monitor

## 2013-10-07 NOTE — Progress Notes (Signed)
Chaplain responded to spiritual consult, but patient was sound asleep. Will follow up as necessary.

## 2013-10-08 LAB — BASIC METABOLIC PANEL
BUN: 5 mg/dL — ABNORMAL LOW (ref 6–23)
CALCIUM: 8.9 mg/dL (ref 8.4–10.5)
CO2: 25 mEq/L (ref 19–32)
Chloride: 107 mEq/L (ref 96–112)
Creatinine, Ser: 0.78 mg/dL (ref 0.50–1.35)
GFR calc Af Amer: >90 mL/min (ref >90)
GFR calc non Af Amer: >90 mL/min (ref >90)
GLUCOSE: 94 mg/dL (ref 70–99)
Potassium: 3.8 mEq/L (ref 3.7–5.3)
Sodium: 143 mEq/L (ref 137–147)

## 2013-10-08 LAB — CBC
HCT: 37.1 % — ABNORMAL LOW (ref 39.0–52.0)
Hemoglobin: 12.5 g/dL — ABNORMAL LOW (ref 13.0–17.0)
MCH: 27.7 pg (ref 26.0–34.0)
MCHC: 33.7 g/dL (ref 30.0–36.0)
MCV: 82.3 fL (ref 78.0–100.0)
Platelets: 247 10*3/uL (ref 150–400)
RBC: 4.51 MIL/uL (ref 4.22–5.81)
RDW: 15.1 % (ref 11.5–15.5)
WBC: 5.7 10*3/uL (ref 4.0–10.5)

## 2013-10-08 MED ORDER — TRAZODONE HCL 50 MG PO TABS
50.0000 mg | ORAL_TABLET | Freq: Every day | ORAL | Status: DC
  Administered 2013-10-08 – 2013-10-09 (×2): 50 mg via ORAL
  Filled 2013-10-08 (×3): qty 1

## 2013-10-08 NOTE — Progress Notes (Signed)
Took himself off heart monitor and starts to walk to ED.  States he left clothing, wallet and watch in white bag in ED upon admit.  Security called third time to ask about patient's belongings and cannot find them.  Security states they will come up to talk with patient.  Patient back to room and placed on monitor.  Ativan given.  Continue to watch.

## 2013-10-08 NOTE — Progress Notes (Signed)
[facility] Progress Note [place] TEAM 1 - Stepdown/ICU TEAM   [name] ZOX:[id number] DOB: [birth year] DOA: 10/05/2013 PCP: No PCP Per Patient    Brief narrative: 43 y/o male with h/o long term alcohol abuse presented with ongoing alcohol abuse and tremors. Pt was admitted to a hospital recently for ETOH withdrawal, remained sober for a few days after being discharged and then began to drink again. He states he was essentially drinking beer constantly while laying in bed for the past 5 days. He lives alone. He began to feel weak and have tremors and called his AA sponser who brought him to the hospital .   Subjective: Still feels very anxious- no other complaints.  Assessment/Plan: Principal Problem:   Alcohol abuse and withdrawal - cont Librium at 25 mg TID - cont routine Ativan at 2 mg every 4 hrs in addition to PRN Ativan - very high risk for seizures- seizure and fall precautions  Active Problems:    GI bleed- probable - suspect alcoholic gastritis  - hemoccult stool - started Protonix  -Hgb stable with hydration - d/c Lovenox   Vomiting - resolved - advanced to regular diet  Mariajuana abuse  Depression - has seen multiple different psychiatrists and has been on and off of Lamictal and Seroquel for past 6-8 months - he has not taken them in over a month  Code Status: Full code Family Communication: none Disposition Plan: follow in SDU  Consultants: none  Procedures: none  Antibiotics: none  DVT prophylaxis: SCDs- d/c Lovenox  Objective: Blood pressure 97/55, pulse 70, temperature 97.4 F (36.3 C), temperature source Oral, resp. rate 13, height 5\' 9"  (1.753 m), weight 73.8 kg (162 lb 11.2 oz), SpO2 97.00%.  Intake/Output Summary (Last 24 hours) at 10/08/13 1511 Last data filed at 10/08/13 1204  Gross per 24 hour  Intake 1110.5 ml  Output   2000 ml  Net -889.5 ml     Exam: General: No acute respiratory distress- continues  to be anxious appearing with tremulous hands- AAO  X 3 Lungs: Clear to auscultation bilaterally without wheezes or crackles Cardiovascular: Regular rate and rhythm without murmur- HR 90-100 Abdomen: Nontender, nondistended, soft, bowel sounds positive, no rebound, no ascites, no appreciable mass Extremities: No significant cyanosis, clubbing, or edema bilateral lower extremities  Data Reviewed: Basic Metabolic Panel:  Recent Labs Lab 10/05/13 1655 10/06/13 0448 10/08/13 0405  NA 145 146 143  K 4.0 3.8 3.8  CL 102 109 107  CO2 27 27 25   GLUCOSE 107* 81 94  BUN 6 8 5*  CREATININE 0.92 0.82 0.78  CALCIUM 8.7 7.6* 8.9   Liver Function Tests:  Recent Labs Lab 10/05/13 1655 10/06/13 0448  AST 47* 28  ALT 53 36  ALKPHOS 66 49  BILITOT 0.5 0.8  PROT 7.9 5.8*  ALBUMIN 4.0 3.0*   No results found for this basename: LIPASE, AMYLASE,  in the last 168 hours No results found for this basename: AMMONIA,  in the last 168 hours CBC:  Recent Labs Lab 10/05/13 1655 10/06/13 0448 10/08/13 0405  WBC 6.4 5.0 5.7  HGB 14.2 12.1* 12.5*  HCT 40.8 36.9* 37.1*  MCV 81.4 83.1 82.3  PLT 275 235 247   Cardiac Enzymes: No results found for this basename: CKTOTAL, CKMB, CKMBINDEX, TROPONINI,  in the last 168 hours BNP (last 3 results) No results found for this basename: PROBNP,  in the last 8760 hours CBG:  Recent Labs Lab 10/05/13 2340 10/06/13 [id number]  GLUCAP 122* 74    Recent Results (from the past 240 hour(s))  MRSA PCR SCREENING     Status: None   Collection Time    10/06/13  6:15 AM      Result Value Range Status   MRSA by PCR NEGATIVE  NEGATIVE Final   Comment:            The GeneXpert MRSA Assay (FDA     approved for NASAL specimens     only), is one component of a     comprehensive MRSA colonization     surveillance program. It is not     intended to diagnose MRSA     infection nor to guide or     monitor treatment for     MRSA infections.      Studies:  Recent x-Dennis studies have been reviewed in detail by the Attending Physician  Scheduled Meds:  Scheduled Meds: . chlordiazePOXIDE  25 mg Oral QID  . folic acid  1 mg Oral Daily  . influenza vac split quadrivalent PF  0.5 mL Intramuscular Tomorrow-1000  . LORazepam  2 mg Intravenous Q4H  . multivitamin with minerals  1 tablet Oral Daily  . pantoprazole  40 mg Oral Daily  . sodium chloride  3 mL Intravenous Q12H  . thiamine  100 mg Oral Daily   Continuous Infusions: . sodium chloride 50 mL/hr at 10/07/13 1900    Time spent on care of this patient: 35 min   Neiman Roots, MD  [facility] Office  [phone] Pager - Text Page per Loretha Stapler as per below:  On-Call/Text Page:      [email or web address]      password [email or web address]  If 7PM-7AM, please contact night-coverage [email or web address] Password [email or web address] 10/08/2013, 3:11 PM   LOS: 3 days

## 2013-10-08 NOTE — Progress Notes (Addendum)
IV right forearm infiltrated, site unremarkable.  No complaints. Tried to restart in right wrist unsuccessful.  Patient tolerated without event. Site unremarkable. Refused influenza shot, educated. States, "my wife and I have never taken the the flu shot and I have never gotten the flu".

## 2013-10-09 MED ORDER — LORAZEPAM 1 MG PO TABS
2.0000 mg | ORAL_TABLET | ORAL | Status: DC | PRN
  Administered 2013-10-10: 2 mg via ORAL
  Filled 2013-10-09: qty 2

## 2013-10-09 NOTE — Progress Notes (Signed)
[facility] Progress Note [name] - Stepdown/ICU TEAM   [name] IEP:[id number] DOB: 1971-03-09 DOA: 10/05/2013 PCP: No PCP Per Patient    Brief narrative: 43 y/o male with h/o long term alcohol abuse presented with ongoing alcohol abuse and tremors. Pt was admitted to a hospital recently for ETOH withdrawal, remained sober for a few days after being discharged and then began to drink again. He states he was essentially drinking beer constantly while laying in bed for the past 5 days. He lives alone. He began to feel weak and have tremors and called his AA sponser who brought him to the hospital .   Subjective: Still feels very anxious- no other complaints.  Assessment/Plan: Principal Problem:   Alcohol abuse and withdrawal - symptoms now improving with lower CIWA scores- d/c routine Ativan now and continue PRN Ativan - cont Librium at 25 mg TID  Active Problems:    GI bleed- probable - suspect alcoholic gastritis  - hemoccult stool - started Protonix  -Hgb stable with hydration - d/c Lovenox   Vomiting - resolved - advanced to regular diet  Mariajuana abuse  Depression - has seen multiple different psychiatrists and has been on and off of Lamictal and Seroquel for past 6-8 months - he has not taken them in over a month - will call psych to help manage depression symptoms  Code Status: Full code Family Communication: none Disposition Plan: transfer to med/surg  Consultants: none  Procedures: none  Antibiotics: none  DVT prophylaxis: SCDs- d/c Lovenox  Objective: Blood pressure 116/65, pulse 80, temperature 97.4 F (36.3 C), temperature source Oral, resp. rate 14, height 5\' 9"  (1.753 m), weight 73.074 kg (161 lb 1.6 oz), SpO2 98.00%.  Intake/Output Summary (Last 24 hours) at 10/09/13 1614 Last data filed at 10/09/13 1023  Gross per 24 hour  Intake    603 ml  Output   1550 ml  Net   -947 ml     Exam: General: No acute  respiratory distress- tremors resolved - no overt signs of anxiety Lungs: Clear to auscultation bilaterally without wheezes or crackles Cardiovascular: Regular rate and rhythm without murmur- HR 90-100 Abdomen: Nontender, nondistended, soft, bowel sounds positive, no rebound, no ascites, no appreciable mass Extremities: No significant cyanosis, clubbing, or edema bilateral lower extremities  Data Reviewed: Basic Metabolic Panel:  Recent Labs Lab 10/05/13 1655 10/06/13 0448 10/08/13 0405  NA 145 146 143  K 4.0 3.8 3.8  CL 102 109 107  CO2 27 27 25   GLUCOSE 107* 81 94  BUN 6 8 5*  CREATININE 0.92 0.82 0.78  CALCIUM 8.7 7.6* 8.9   Liver Function Tests:  Recent Labs Lab 10/05/13 1655 10/06/13 0448  AST 47* 28  ALT 53 36  ALKPHOS 66 49  BILITOT 0.5 0.8  PROT 7.9 5.8*  ALBUMIN 4.0 3.0*   No results found for this basename: LIPASE, AMYLASE,  in the last 168 hours No results found for this basename: AMMONIA,  in the last 168 hours CBC:  Recent Labs Lab 10/05/13 1655 10/06/13 0448 10/08/13 0405  WBC 6.4 5.0 5.7  HGB 14.2 12.1* 12.5*  HCT 40.8 36.9* 37.1*  MCV 81.4 83.1 82.3  PLT 275 235 247   Cardiac Enzymes: No results found for this basename: CKTOTAL, CKMB, CKMBINDEX, TROPONINI,  in the last 168 hours BNP (last 3 results) No results found for this basename: PROBNP,  in the last 8760 hours CBG:  Recent Labs Lab 10/05/13 2340 10/06/13  [id number]  GLUCAP 122* 74    Recent Results (from the past 240 hour(s))  MRSA PCR SCREENING     Status: None   Collection Time    10/06/13  6:15 AM      Result Value Range Status   MRSA by PCR NEGATIVE  NEGATIVE Final   Comment:            The GeneXpert MRSA Assay (FDA     approved for NASAL specimens     only), is one component of a     comprehensive MRSA colonization     surveillance program. It is not     intended to diagnose MRSA     infection nor to guide or     monitor treatment for     MRSA infections.      Studies:  Recent x-ray studies have been reviewed in detail by the Attending Physician  Scheduled Meds:  Scheduled Meds: . chlordiazePOXIDE  25 mg Oral QID  . folic acid  1 mg Oral Daily  . influenza vac split quadrivalent PF  0.5 mL Intramuscular Tomorrow-1000  . multivitamin with minerals  1 tablet Oral Daily  . pantoprazole  40 mg Oral Daily  . sodium chloride  3 mL Intravenous Q12H  . thiamine  100 mg Oral Daily  . traZODone  50 mg Oral QHS   Continuous Infusions:    Time spent on care of this patient: 25 min   [name],[name] , MD  [facility] Office  [phone] Pager - Text Page per Loretha StaplerAmion as per below:  On-Call/Text Page:      [email or web address]      password [email or web address]  If 7PM-7AM, please contact night-coverage [email or web address] Password [email or web address] 10/09/2013, 4:14 PM   LOS: 4 days

## 2013-10-10 MED ORDER — ADULT MULTIVITAMIN W/MINERALS CH: 1.0000 | ORAL_TABLET | Freq: Every day | ORAL | Status: DC

## 2013-10-10 MED ORDER — TRAZODONE HCL 50 MG PO TABS: 50.0000 mg | ORAL_TABLET | Freq: Every day | ORAL | Status: DC

## 2013-10-10 MED ORDER — LORAZEPAM 1 MG PO TABS: 1.0000 mg | ORAL_TABLET | ORAL | Status: DC | PRN

## 2013-10-10 NOTE — Progress Notes (Signed)
CIWA 21 at 8pm ativan 2mg  iv given pt is much calmer now,less tremors.[place] [place] suicide precautions had been dcd. Pt denies any suicide thoughts or ideations.Stated male physician he spoke with sat night 10/08/13 told him he no longer needed suicide precautions.Pt's room is close to the desk and camera moniter is own as precautionary measure.Dennis HeadlandBeverly, Valentine Kuechle D

## 2013-10-10 NOTE — Discharge Summary (Signed)
Physician Discharge Summary  [name] ZOX:[id number] DOB: November 03, 1970 DOA: 10/05/2013  PCP: No PCP Per Patient  Admit date: 10/05/2013 Discharge date: 10/10/2013  Time spent: >35 minutes  Recommendations for Outpatient Follow-up:  F/u with psychiatry in 12 week  F/uw ith PCP in 1 week wellness clinic as needed  Discharge Diagnoses:  Principal Problem:   Alcohol withdrawal Active Problems:   Suicidal ideation   GI bleed   Marijuana abuse   Discharge Condition: stable   Diet recommendation: regular  Filed Weights   10/07/13 0500 10/08/13 0500 10/09/13 0402  Weight: 71.1 kg (156 lb 12 oz) 73.8 kg (162 lb 11.2 oz) 73.074 kg (161 lb 1.6 oz)    History of present illness:  43 y/o male with h/o long term alcohol abuse presented with ongoing alcohol abuse and tremors. Pt was admitted to a hospital recently for ETOH withdrawal, remained sober for a few days after being discharged and then began to drink again. He states he was essentially drinking beer constantly while laying in bed for the past 5 days. He began to feel weak and have tremors and called his AA sponser who brought him to the hospital .   Hospital Course:  1. Alcohol abuse, mild tremors on admission  no s/s of DT, denies hallucinations, vitals stable; labs unremarkable;  -d/w patient alcohol abuse; recommended to f/u alcohol anonymous group outpatient   2. Mariajuana abuse, recommended to stop using drugs   3. H/o Depression; denies any depression at this time; no suicidal ideation or plans;  -he would prefer to f/u with psychiatry as outpatient  Patient is being d/c home in stable condition   Procedures:  None  (i.e. Studies not automatically included, echos, thoracentesis, etc; not x-rays)  Consultations:  None   Discharge Exam: Filed Vitals:   10/10/13 0530  BP: 105/55  Pulse: 68  Temp: 97.4 F (36.3 C)  Resp: 18    General: alert Cardiovascular: s1,s2 rrr Respiratory: CTA BL  Discharge  Instructions  Discharge Orders   Future Orders Complete By Expires   Diet - low sodium heart healthy  As directed    Discharge instructions  As directed    Comments:     Please follow up with psychiatry in 12 weeks   Increase activity slowly  As directed        Medication List         LORazepam 1 MG tablet  Commonly known as:  ATIVAN  Take 1 tablet (1 mg total) by mouth every 4 (four) hours as needed for anxiety.     multivitamin with minerals Tabs tablet  Take 1 tablet by mouth daily.     traZODone 50 MG tablet  Commonly known as:  DESYREL  Take 1 tablet (50 mg total) by mouth at bedtime.       No Known Allergies     Follow-up Information   Follow up with No PCP Per Patient.   Specialty:  General Practice   Contact information:   [address] [place] [address] [phone]       Follow up with [facility]     In 1 week.   Contact information:   [address] [place] [address] [phone]       The results of significant diagnostics from this hospitalization (including imaging, microbiology, ancillary and laboratory) are listed below for reference.    Significant Diagnostic Studies: No results found.  Microbiology: Recent Results (  from the past 240 hour(s))  MRSA PCR SCREENING     Status: None   Collection Time    10/06/13  6:15 AM      Result Value Range Status   MRSA by PCR NEGATIVE  NEGATIVE Final   Comment:            The GeneXpert MRSA Assay (FDA     approved for NASAL specimens     only), is one component of a     comprehensive MRSA colonization     surveillance program. It is not     intended to diagnose MRSA     infection nor to guide or     monitor treatment for     MRSA infections.     Labs: Basic Metabolic Panel:  Recent Labs Lab 10/05/13 1655 10/06/13 0448 10/08/13 0405  NA 145 146 143  K 4.0 3.8 3.8  CL 102 109 107  CO2 27 27 25   GLUCOSE 107* 81 94  BUN 6 8 5*   CREATININE 0.92 0.82 0.78  CALCIUM 8.7 7.6* 8.9   Liver Function Tests:  Recent Labs Lab 10/05/13 1655 10/06/13 0448  AST 47* 28  ALT 53 36  ALKPHOS 66 49  BILITOT 0.5 0.8  PROT 7.9 5.8*  ALBUMIN 4.0 3.0*   No results found for this basename: LIPASE, AMYLASE,  in the last 168 hours No results found for this basename: AMMONIA,  in the last 168 hours CBC:  Recent Labs Lab 10/05/13 1655 10/06/13 0448 10/08/13 0405  WBC 6.4 5.0 5.7  HGB 14.2 12.1* 12.5*  HCT 40.8 36.9* 37.1*  MCV 81.4 83.1 82.3  PLT 275 235 247   Cardiac Enzymes: No results found for this basename: CKTOTAL, CKMB, CKMBINDEX, TROPONINI,  in the last 168 hours BNP: BNP (last 3 results) No results found for this basename: PROBNP,  in the last 8760 hours CBG:  Recent Labs Lab 10/05/13 2340 10/06/13 0639  GLUCAP 122* 74       Signed:  Etan Vasudevan N  [facility] 10/10/2013, 10:18 AM

## 2013-10-14 ENCOUNTER — Ambulatory Visit: Payer: Medicaid - Out of State | Attending: Internal Medicine | Admitting: Internal Medicine

## 2013-10-14 ENCOUNTER — Encounter: Payer: Self-pay | Admitting: Internal Medicine

## 2013-10-14 VITALS — BP 156/87 | HR 100 | Temp 97.0°F | Resp 18 | Wt 168.2 lb

## 2013-10-14 DIAGNOSIS — Z139 Encounter for screening, unspecified: Secondary | ICD-10-CM

## 2013-10-14 DIAGNOSIS — F10239 Alcohol dependence with withdrawal, unspecified: Secondary | ICD-10-CM

## 2013-10-14 DIAGNOSIS — F411 Generalized anxiety disorder: Secondary | ICD-10-CM

## 2013-10-14 DIAGNOSIS — F17200 Nicotine dependence, unspecified, uncomplicated: Secondary | ICD-10-CM

## 2013-10-14 DIAGNOSIS — F10939 Alcohol use, unspecified with withdrawal, unspecified: Secondary | ICD-10-CM

## 2013-10-14 DIAGNOSIS — F1010 Alcohol abuse, uncomplicated: Secondary | ICD-10-CM

## 2013-10-14 LAB — COMPLETE METABOLIC PANEL WITH GFR
ALT: 25 U/L (ref 0–53)
AST: 20 U/L (ref 0–37)
Albumin: 4.8 g/dL (ref 3.5–5.2)
Alkaline Phosphatase: 58 U/L (ref 39–117)
BILIRUBIN TOTAL: 0.6 mg/dL (ref 0.3–1.2)
BUN: 9 mg/dL (ref 6–23)
CALCIUM: 10.1 mg/dL (ref 8.4–10.5)
CHLORIDE: 103 meq/L (ref 96–112)
CO2: 29 meq/L (ref 19–32)
CREATININE: 0.9 mg/dL (ref 0.50–1.35)
GLUCOSE: 97 mg/dL (ref 70–99)
Potassium: 4.1 mEq/L (ref 3.5–5.3)
Sodium: 141 mEq/L (ref 135–145)
Total Protein: 8 g/dL (ref 6.0–8.3)

## 2013-10-14 LAB — LIPID PANEL
CHOLESTEROL: 225 mg/dL — AB (ref 0–200)
HDL: 62 mg/dL (ref >39)
LDL Cholesterol: 141 mg/dL — ABNORMAL HIGH (ref 0–99)
Total CHOL/HDL Ratio: 3.6 Ratio
Triglycerides: 112 mg/dL (ref <150)
VLDL: 22 mg/dL (ref 0–40)

## 2013-10-14 MED ORDER — NICOTINE POLACRILEX 2 MG MT GUM: 2.0000 mg | CHEWING_GUM | OROMUCOSAL | Status: DC | PRN

## 2013-10-14 MED ORDER — LORAZEPAM 1 MG PO TABS: 1.0000 mg | ORAL_TABLET | ORAL | Status: DC | PRN

## 2013-10-14 NOTE — Progress Notes (Signed)
Patient is here for hospital follow up Patient is an alcoholic Currently in AA [place] due to withdrawal from alcohol

## 2013-10-14 NOTE — Progress Notes (Signed)
Patient Demographics  [name], is a 43 y.o. male  WJX:[id number]  OZH:[id number]  DOB - Jan 04, 1971  CC:  Chief Complaint  Patient presents with  . Hospitalization Follow-up       HPI: [name] is a 43 y.o. male here today to establish medical care. he was recently hospitalized, EMR reviewed patient has history of alcohol abuse, local withdrawal, anxiety/depression, patient has history of binge drinking, he was discharged on Monday, denies any hallucination has mild headache and mild tremors, patient is currently following up with AA, has history of marijuana abuse, patient does smoke cigarettes, advised to quit smoking he is agreeable to try nicotine gum, patient was prescribed Ativan on the discharge as per patient he then out of the medication, patient was also advised to follow with psychiatry. Patient denies any history of seizures. Patient has mild headache, No chest pain, No abdominal pain - No Nausea, No new weakness tingling or numbness, No Cough - SOB.  No Known Allergies Past Medical History  Diagnosis Date  . Alcohol abuse   . Anxiety   . Depression   . Bipolar 2 disorder   . Active smoker    Current Outpatient Prescriptions on File Prior to Visit  Medication Sig Dispense Refill  . Multiple Vitamin (MULTIVITAMIN WITH MINERALS) TABS tablet Take 1 tablet by mouth daily.      . traZODone (DESYREL) 50 MG tablet Take 1 tablet (50 mg total) by mouth at bedtime.  30 tablet  0   No current facility-administered medications on file prior to visit.   Family History  Problem Relation Age of Onset  . Alcohol abuse Father    History   Social History  . Marital Status: Legally Separated    Spouse Name: N/A    Number of Children: N/A  . Years of Education: N/A   Occupational History  . Not on file.   Social History Main Topics  . Smoking status: Current Every Day Smoker -- 2.00 packs/day for 20 years    Types: Cigarettes  . Smokeless tobacco: Never Used   . Alcohol Use: 151.2 oz/week    252 Cans of beer per week     Comment: heavy   . Drug Use: Yes    Special: Marijuana  . Sexual Activity: Yes   Other Topics Concern  . Not on file   Social History Narrative   Has an ex-wife and 61 yo son whom he cannot see now, as his ex-wife divorced him. Was in the [employer] and went to [place], [place], and may have some PTSD symptoms from this. Was also a Management consultant. Heavy alcohol abuse during binges, but has had periods of sobriety for months on end. Has been drinking up to two cases of beer a day    Review of Systems: Constitutional: Negative for fever, chills, diaphoresis, activity change, appetite change and fatigue. HENT: Negative for ear pain, nosebleeds, congestion, facial swelling, rhinorrhea, neck pain, neck stiffness and ear discharge.  Eyes: Negative for pain, discharge, redness, itching and visual disturbance. Respiratory: Negative for cough, choking, chest tightness, shortness of breath, wheezing and stridor.  Cardiovascular: Negative for chest pain, palpitations and leg swelling. Gastrointestinal: Negative for abdominal distention. Genitourinary: Negative for dysuria, urgency, frequency, hematuria, flank pain, decreased urine volume, difficulty urinating and dyspareunia.  Musculoskeletal: Negative for back pain, joint swelling, arthralgia and gait problem. Neurological: Negative for dizziness, +tremors, seizures, syncope, facial asymmetry, speech difficulty, weakness, light-headedness, numbness and headaches.  Hematological: Negative for adenopathy. Does not  bruise/bleed easily. Psychiatric/Behavioral: Negative for hallucinations, behavioral problems, confusion, dysphoric mood, decreased concentration and agitation.    Objective:   Filed Vitals:   10/14/13 1518  BP: 156/87  Pulse: 100  Temp: 97 F (36.1 C)  Resp: 18    Physical Exam: Constitutional: Patient appears well-developed and well-nourished. No distress. HENT:  Normocephalic, atraumatic, External right and left ear normal. Oropharynx is clear and moist.  Eyes: Conjunctivae and EOM are normal. PERRLA, no scleral icterus. Neck: Normal ROM. Neck supple. No JVD. No tracheal deviation. No thyromegaly. CVS: RRR, S1/S2 +, no murmurs, no gallops, no carotid bruit.  Pulmonary: Effort and breath sounds normal, no stridor, rhonchi, wheezes, rales.  Abdominal: Soft. BS +, no distension, tenderness, rebound or guarding.  Musculoskeletal: Normal range of motion. No edema and no tenderness.  Neuro: Alert. Normal reflexes, muscle tone coordination. No cranial nerve deficit. +ve tremors of outstretched hands. Skin: Skin is warm and dry. No rash noted. Not diaphoretic. No erythema. No pallor. Psychiatric: Normal mood and affect. Behavior, judgment, thought content normal.  Lab Results  Component Value Date   WBC 5.7 10/08/2013   HGB 12.5* 10/08/2013   HCT 37.1* 10/08/2013   MCV 82.3 10/08/2013   PLT 247 10/08/2013   Lab Results  Component Value Date   CREATININE 0.78 10/08/2013   BUN 5* 10/08/2013   NA 143 10/08/2013   K 3.8 10/08/2013   CL 107 10/08/2013   CO2 25 10/08/2013    No results found for this basename: HGBA1C   Lipid Panel     Component Value Date/Time   CHOL  Value: 253        ATP III CLASSIFICATION:  <200     mg/dL   Desirable  161-096200-239  mg/dL   Borderline High  >=045>=240    mg/dL   High       * 4/09/81193/20/2012 0508   TRIG 63 12/24/2010 0508   HDL 84 12/24/2010 0508   CHOLHDL 3.0 12/24/2010 0508   VLDL 13 12/24/2010 0508   LDLCALC  Value: 156        Total Cholesterol/HDL:CHD Risk Coronary Heart Disease Risk Table                     Men   Women  1/2 Average Risk   3.4   3.3  Average Risk       5.0   4.4  2 X Average Risk   9.6   7.1  3 X Average Risk  23.4   11.0        Use the calculated Patient Ratio above and the CHD Risk Table to determine the patient's CHD Risk.        ATP III CLASSIFICATION (LDL):  <100     mg/dL   Optimal  147-829100-129  mg/dL   Near or Above                     Optimal  130-159  mg/dL   Borderline  562-130160-189  mg/dL   High  >865>190     mg/dL   Very High* 7/84/69623/20/2012 0508       Assessment and plan:   1. Alcohol abuse/alcohol withdrawal Patient currently following up AA Denies any more drinking, has mild symptoms of withdrawal tremulous headache borderline elevated blood pressure, patient is not tachycardic. I prescribed lorazepam when necessary. - LORazepam (ATIVAN) 1 MG tablet; Take 1 tablet (1 mg total) by mouth every 4 (four) hours  as needed for anxiety.  Dispense: 30 tablet; Refill: 0  2. Anxiety state, unspecified  - LORazepam (ATIVAN) 1 MG tablet; Take 1 tablet (1 mg total) by mouth every 4 (four) hours as needed for anxiety.  Dispense: 30 tablet; Refill: 0 - Ambulatory referral to Psychiatry  3. Smoking  - nicotine polacrilex (EQ NICOTINE) 2 MG gum; Take 1 each (2 mg total) by mouth as needed for smoking cessation.  Dispense: 100 tablet; Refill: 0  4. Screening Baseline blood work. - COMPLETE METABOLIC PANEL WITH GFR - TSH - Lipid panel - Vit D  25 hydroxy (rtn osteoporosis monitoring)  Follow up in 2 weeks   The patient was given clear instructions to go to ER or return to medical center if symptoms don't improve, worsen or new problems develop. The patient verbalized understanding. The patient was told to call to get lab results if they haven't heard anything in the next week.     Doris Cheadle, MD

## 2013-10-15 LAB — VITAMIN D 25 HYDROXY (VIT D DEFICIENCY, FRACTURES): Vit D, 25-Hydroxy: 36 ng/mL (ref 30–89)

## 2013-10-15 LAB — TSH: TSH: 0.991 u[IU]/mL (ref 0.350–4.500)

## 2013-10-20 ENCOUNTER — Telehealth: Payer: Self-pay

## 2013-10-20 ENCOUNTER — Telehealth: Payer: Self-pay | Admitting: *Deleted

## 2013-10-20 NOTE — Telephone Encounter (Signed)
Message copied by Lestine MountJUAREZ, Briyana Badman L on Thu Oct 20, 2013  2:11 PM ------      Message from: Doris CheadleADVANI, DEEPAK      Created: Thu Oct 20, 2013  9:30 AM       Blood work reviewed, noticed elevated cholesterol, advise patient for low fat diet.             ------

## 2013-10-20 NOTE — Telephone Encounter (Signed)
Message copied by Raynelle CharyWINFREE, Cortney Mckinney R on Thu Oct 20, 2013  1:23 PM ------      Message from: Doris CheadleADVANI, DEEPAK      Created: Thu Oct 20, 2013  9:30 AM       Blood work reviewed, noticed elevated cholesterol, advise patient for low fat diet.             ------

## 2013-10-20 NOTE — Telephone Encounter (Signed)
I spoke to the pt and informed him of his lab results. I informed him that a good diet and exercise is what the doctor has ordered.

## 2013-10-20 NOTE — Telephone Encounter (Signed)
Patient is aware of his lab results 

## 2013-11-02 ENCOUNTER — Ambulatory Visit: Payer: Medicaid - Out of State | Attending: Internal Medicine | Admitting: Internal Medicine

## 2013-11-02 ENCOUNTER — Encounter: Payer: Self-pay | Admitting: Internal Medicine

## 2013-11-02 VITALS — BP 129/80 | HR 89 | Temp 97.9°F | Resp 16 | Ht 70.0 in | Wt 167.0 lb

## 2013-11-02 DIAGNOSIS — K649 Unspecified hemorrhoids: Secondary | ICD-10-CM | POA: Insufficient documentation

## 2013-11-02 DIAGNOSIS — K625 Hemorrhage of anus and rectum: Secondary | ICD-10-CM

## 2013-11-02 LAB — CBC WITH DIFFERENTIAL/PLATELET
Basophils Absolute: 0 10*3/uL (ref 0.0–0.1)
Basophils Relative: 0 % (ref 0–1)
EOS PCT: 2 % (ref 0–5)
Eosinophils Absolute: 0.1 10*3/uL (ref 0.0–0.7)
HEMATOCRIT: 48.2 % (ref 39.0–52.0)
HEMOGLOBIN: 16.3 g/dL (ref 13.0–17.0)
LYMPHS ABS: 2.5 10*3/uL (ref 0.7–4.0)
LYMPHS PCT: 32 % (ref 12–46)
MCH: 27.1 pg (ref 26.0–34.0)
MCHC: 33.8 g/dL (ref 30.0–36.0)
MCV: 80.2 fL (ref 78.0–100.0)
MONOS PCT: 8 % (ref 3–12)
Monocytes Absolute: 0.6 10*3/uL (ref 0.1–1.0)
Neutro Abs: 4.4 10*3/uL (ref 1.7–7.7)
Neutrophils Relative %: 58 % (ref 43–77)
Platelets: 270 10*3/uL (ref 150–400)
RBC: 6.01 MIL/uL — AB (ref 4.22–5.81)
RDW: 15.5 % (ref 11.5–15.5)
WBC: 7.6 10*3/uL (ref 4.0–10.5)

## 2013-11-02 MED ORDER — PANTOPRAZOLE SODIUM 40 MG PO TBEC: 40.0000 mg | DELAYED_RELEASE_TABLET | Freq: Two times a day (BID) | ORAL | Status: DC

## 2013-11-02 MED ORDER — HYDROCORTISONE ACETATE 25 MG RE SUPP: 25.0000 mg | Freq: Two times a day (BID) | RECTAL | Status: DC

## 2013-11-02 MED ORDER — DOCUSATE SODIUM 100 MG PO CAPS: 200.0000 mg | ORAL_CAPSULE | Freq: Two times a day (BID) | ORAL | Status: DC

## 2013-11-02 NOTE — Progress Notes (Signed)
Pt is here today with an upset stomach. Pt reports having occasional blood in his stool. Pt has a history of hemorrhoids.

## 2013-11-02 NOTE — Progress Notes (Signed)
Patient ID: [name], male   DOB: 02-28-1971, 43 y.o.   MRN: [id number]   CC:  HPI: 43 year old male with a history of binge drinking, presents to the clinic with a chief complaint of rectal bleeding, bleeding has been ongoing anywhere from 2-3 years. He thinks it is coming from his hemorrhoids although he does describe dark colored stools. The patient has a mildly low hemoglobin of around 12. He also complains of bloating, epigastric pain, rectal discomfort when he has a bowel movement, reports 3 bowel movements in the morning on a daily basis He has never had an EGD or a colonoscopy. He states that he has stopped drinking and currently sees alcohol anonymous Also history of depression, marijuana, reports decreased appetite and 9 pound weight loss   No Known Allergies Past Medical History  Diagnosis Date  . Alcohol abuse   . Anxiety   . Depression   . Bipolar 2 disorder   . Active smoker    Current Outpatient Prescriptions on File Prior to Visit  Medication Sig Dispense Refill  . LORazepam (ATIVAN) 1 MG tablet Take 1 tablet (1 mg total) by mouth every 4 (four) hours as needed for anxiety.  30 tablet  0  . traZODone (DESYREL) 50 MG tablet Take 1 tablet (50 mg total) by mouth at bedtime.  30 tablet  0  . Multiple Vitamin (MULTIVITAMIN WITH MINERALS) TABS tablet Take 1 tablet by mouth daily.      . nicotine polacrilex (EQ NICOTINE) 2 MG gum Take 1 each (2 mg total) by mouth as needed for smoking cessation.  100 tablet  0   No current facility-administered medications on file prior to visit.   Family History  Problem Relation Age of Onset  . Alcohol abuse Father    History   Social History  . Marital Status: Legally Separated    Spouse Name: N/A    Number of Children: N/A  . Years of Education: N/A   Occupational History  . Not on file.   Social History Main Topics  . Smoking status: Current Every Day Smoker -- 2.00 packs/day for 20 years    Types: Cigarettes  .  Smokeless tobacco: Never Used  . Alcohol Use: 151.2 oz/week    252 Cans of beer per week     Comment: heavy   . Drug Use: Yes    Special: Marijuana  . Sexual Activity: Yes   Other Topics Concern  . Not on file   Social History Narrative   Has an ex-wife and 42 yo son whom he cannot see now, as his ex-wife divorced him. Was in the [employer] and went [place], and may have some PTSD symptoms from this. Was also a Management consultanthome builder. Heavy alcohol abuse during binges, but has had periods of sobriety for months on end. Has been drinking up to two cases of beer a day    Review of Systems  Constitutional: Negative for fever, chills, diaphoresis, activity change, appetite change and fatigue.  HENT: Negative for ear pain, nosebleeds, congestion, facial swelling, rhinorrhea, neck pain, neck stiffness and ear discharge.   Eyes: Negative for pain, discharge, redness, itching and visual disturbance.  Respiratory: Negative for cough, choking, chest tightness, shortness of breath, wheezing and stridor.   Cardiovascular: Negative for chest pain, palpitations and leg swelling.  Gastrointestinal: As in history of present illness Genitourinary: Negative for dysuria, urgency, frequency, hematuria, flank pain, decreased urine volume, difficulty urinating and dyspareunia.  Musculoskeletal: Negative for  back pain, joint swelling, arthralgias and gait problem.  Neurological: Negative for dizziness, tremors, seizures, syncope, facial asymmetry, speech difficulty, weakness, light-headedness, numbness and headaches.  Hematological: Negative for adenopathy. Does not bruise/bleed easily.  Psychiatric/Behavioral: Negative for hallucinations, behavioral problems, confusion, dysphoric mood, decreased concentration and agitation.    Objective:   Filed Vitals:   11/02/13 1147  BP: 129/80  Pulse: 89  Temp: 97.9 F (36.6 C)  Resp: 16    Physical Exam  Constitutional: Appears well-developed and  well-nourished. No distress.  HENT: Normocephalic. External right and left ear normal. Oropharynx is clear and moist.  Eyes: Conjunctivae and EOM are normal. PERRLA, no scleral icterus.  Neck: Normal ROM. Neck supple. No JVD. No tracheal deviation. No thyromegaly.  CVS: RRR, S1/S2 +, no murmurs, no gallops, no carotid bruit.  Pulmonary: Effort and breath sounds normal, no stridor, rhonchi, wheezes, rales.  Abdominal: Soft. BS +,  no distension, mild epigastric tenderness, rebound or guarding.  Musculoskeletal: Normal range of motion. No edema and no tenderness.  Lymphadenopathy: No lymphadenopathy noted, cervical, inguinal. Neuro: Alert. Normal reflexes, muscle tone coordination. No cranial nerve deficit. Skin: Skin is warm and dry. No rash noted. Not diaphoretic. No erythema. No pallor.  Psychiatric: Normal mood and affect. Behavior, judgment, thought content normal.   Lab Results  Component Value Date   WBC 5.7 10/08/2013   HGB 12.5* 10/08/2013   HCT 37.1* 10/08/2013   MCV 82.3 10/08/2013   PLT 247 10/08/2013   Lab Results  Component Value Date   CREATININE 0.90 10/14/2013   BUN 9 10/14/2013   NA 141 10/14/2013   K 4.1 10/14/2013   CL 103 10/14/2013   CO2 29 10/14/2013    No results found for this basename: HGBA1C   Lipid Panel     Component Value Date/Time   CHOL 225* 10/14/2013 1546   TRIG 112 10/14/2013 1546   HDL 62 10/14/2013 1546   CHOLHDL 3.6 10/14/2013 1546   VLDL 22 10/14/2013 1546   LDLCALC 141* 10/14/2013 1546       Assessment and plan:   Patient Active Problem List   Diagnosis Date Noted  . GI bleed 10/06/2013  . Marijuana abuse 10/06/2013  . Alcohol withdrawal 10/05/2013  . Suicidal ideation 10/05/2013  . Alcohol abuse 10/26/2011  . Alcohol withdrawal syndrome 10/26/2011  . Depression 10/26/2011       Rectal bleeding Given symptoms cannot rule out upper GI bleeding, combined with hemorrhoidal bleeding Start the patient on PPI twice a day Urgent gastroenterology referral for  colonoscopy/endoscopy Patient asked to refrain from aspirin, NSAIDs, ibuprofen  Hemorrhoids Anusol HC Followup in 2 weeks  The patient was given clear instructions to go to ER or return to medical center if symptoms don't improve, worsen or new problems develop. The patient verbalized understanding. The patient was told to call to get any lab results if not heard anything in the next week.

## 2013-11-04 ENCOUNTER — Telehealth: Payer: Self-pay | Admitting: *Deleted

## 2013-11-04 NOTE — Telephone Encounter (Signed)
Message copied by Tiarrah Saville, [place] R on Fri Nov 04, 2013 11:59 AM ------      Message from: Susie CassetteABROL MD, [facility] CenterNAYANA      Created: Thu Nov 03, 2013 10:40 AM       Notify patient that hemoglobin is improved at 16.3 from 12.5, 3 weeks ago ------

## 2013-11-04 NOTE — Telephone Encounter (Signed)
Left a voicemail for pt to give us a call back. 

## 2013-11-15 ENCOUNTER — Ambulatory Visit: Payer: Self-pay | Admitting: Internal Medicine

## 2013-12-06 ENCOUNTER — Ambulatory Visit: Payer: Self-pay | Admitting: Internal Medicine

## 2014-03-17 ENCOUNTER — Encounter (HOSPITAL_COMMUNITY): Payer: Self-pay | Admitting: Emergency Medicine

## 2014-03-17 ENCOUNTER — Inpatient Hospital Stay (HOSPITAL_COMMUNITY)
Admission: EM | Admit: 2014-03-17 | Discharge: 2014-03-19 | DRG: 894 | Discharge status: Against Medical Advice | Payer: Self-pay | Attending: Internal Medicine | Admitting: Internal Medicine

## 2014-03-17 ENCOUNTER — Emergency Department (HOSPITAL_COMMUNITY): Payer: Medicaid - Out of State

## 2014-03-17 DIAGNOSIS — F10239 Alcohol dependence with withdrawal, unspecified: Principal | ICD-10-CM | POA: Diagnosis present

## 2014-03-17 DIAGNOSIS — Z6379 Other stressful life events affecting family and household: Secondary | ICD-10-CM

## 2014-03-17 DIAGNOSIS — F10939 Alcohol use, unspecified with withdrawal, unspecified: Principal | ICD-10-CM | POA: Diagnosis present

## 2014-03-17 DIAGNOSIS — F1210 Cannabis abuse, uncomplicated: Secondary | ICD-10-CM | POA: Diagnosis present

## 2014-03-17 DIAGNOSIS — F1020 Alcohol dependence, uncomplicated: Secondary | ICD-10-CM | POA: Diagnosis present

## 2014-03-17 DIAGNOSIS — F17200 Nicotine dependence, unspecified, uncomplicated: Secondary | ICD-10-CM | POA: Diagnosis present

## 2014-03-17 DIAGNOSIS — Z888 Allergy status to other drugs, medicaments and biological substances status: Secondary | ICD-10-CM

## 2014-03-17 DIAGNOSIS — R630 Anorexia: Secondary | ICD-10-CM | POA: Diagnosis present

## 2014-03-17 DIAGNOSIS — F3189 Other bipolar disorder: Secondary | ICD-10-CM | POA: Diagnosis present

## 2014-03-17 DIAGNOSIS — R634 Abnormal weight loss: Secondary | ICD-10-CM | POA: Diagnosis present

## 2014-03-17 DIAGNOSIS — F1010 Alcohol abuse, uncomplicated: Secondary | ICD-10-CM | POA: Diagnosis present

## 2014-03-17 LAB — CBC
HEMATOCRIT: 45.1 % (ref 39.0–52.0)
HEMOGLOBIN: 15.6 g/dL (ref 13.0–17.0)
MCH: 26.9 pg (ref 26.0–34.0)
MCHC: 34.6 g/dL (ref 30.0–36.0)
MCV: 77.9 fL — ABNORMAL LOW (ref 78.0–100.0)
Platelets: 244 10*3/uL (ref 150–400)
RBC: 5.79 MIL/uL (ref 4.22–5.81)
RDW: 14.4 % (ref 11.5–15.5)
WBC: 7.6 10*3/uL (ref 4.0–10.5)

## 2014-03-17 LAB — COMPREHENSIVE METABOLIC PANEL
ALK PHOS: 63 U/L (ref 39–117)
ALT: 64 U/L — AB (ref 0–53)
AST: 61 U/L — ABNORMAL HIGH (ref 0–37)
Albumin: 4.5 g/dL (ref 3.5–5.2)
BILIRUBIN TOTAL: 0.7 mg/dL (ref 0.3–1.2)
BUN: 5 mg/dL — AB (ref 6–23)
CO2: 23 mEq/L (ref 19–32)
Calcium: 9 mg/dL (ref 8.4–10.5)
Chloride: 103 mEq/L (ref 96–112)
Creatinine, Ser: 0.72 mg/dL (ref 0.50–1.35)
Glucose, Bld: 113 mg/dL — ABNORMAL HIGH (ref 70–99)
Potassium: 4 mEq/L (ref 3.7–5.3)
SODIUM: 143 meq/L (ref 137–147)
TOTAL PROTEIN: 8.4 g/dL — AB (ref 6.0–8.3)

## 2014-03-17 LAB — RAPID URINE DRUG SCREEN, HOSP PERFORMED
Amphetamines: NOT DETECTED
BARBITURATES: NOT DETECTED
Benzodiazepines: NOT DETECTED
Cocaine: NOT DETECTED
Opiates: NOT DETECTED
Tetrahydrocannabinol: POSITIVE — AB

## 2014-03-17 LAB — ETHANOL: ALCOHOL ETHYL (B): 190 mg/dL — AB (ref 0–11)

## 2014-03-17 LAB — MRSA PCR SCREENING: MRSA by PCR: NEGATIVE

## 2014-03-17 LAB — PROTIME-INR
INR: 0.92 (ref 0.00–1.49)
Prothrombin Time: 12.2 seconds (ref 11.6–15.2)

## 2014-03-17 MED ORDER — LORAZEPAM 2 MG/ML IJ SOLN: 2.0000 mg | Freq: Once | INTRAMUSCULAR | Status: DC

## 2014-03-17 MED ORDER — ONDANSETRON HCL 4 MG/2ML IJ SOLN
4.0000 mg | Freq: Four times a day (QID) | INTRAMUSCULAR | Status: DC | PRN
  Administered 2014-03-17: 4 mg via INTRAVENOUS
  Filled 2014-03-17: qty 2

## 2014-03-17 MED ORDER — CHLORDIAZEPOXIDE HCL 10 MG PO CAPS
10.0000 mg | ORAL_CAPSULE | Freq: Three times a day (TID) | ORAL | Status: DC
  Administered 2014-03-17: 10 mg via ORAL
  Filled 2014-03-17 (×2): qty 1

## 2014-03-17 MED ORDER — SODIUM CHLORIDE 0.9 % IV SOLN
Freq: Once | INTRAVENOUS | Status: AC
  Administered 2014-03-17: 05:00:00 via INTRAVENOUS

## 2014-03-17 MED ORDER — LORAZEPAM 2 MG/ML IJ SOLN
2.0000 mg | Freq: Once | INTRAMUSCULAR | Status: AC
  Administered 2014-03-17: 2 mg via INTRAVENOUS
  Filled 2014-03-17: qty 1

## 2014-03-17 MED ORDER — ENOXAPARIN SODIUM 40 MG/0.4ML ~~LOC~~ SOLN
40.0000 mg | SUBCUTANEOUS | Status: DC
  Administered 2014-03-17 – 2014-03-18 (×2): 40 mg via SUBCUTANEOUS
  Filled 2014-03-17 (×2): qty 0.4

## 2014-03-17 MED ORDER — ADULT MULTIVITAMIN W/MINERALS CH: 1.0000 | ORAL_TABLET | Freq: Every day | ORAL | Status: DC

## 2014-03-17 MED ORDER — DEXTROSE-NACL 5-0.9 % IV SOLN
INTRAVENOUS | Status: DC
  Administered 2014-03-17: 125 mL/h via INTRAVENOUS
  Administered 2014-03-18: 1000 mL via INTRAVENOUS
  Administered 2014-03-18 – 2014-03-19 (×2): via INTRAVENOUS

## 2014-03-17 MED ORDER — ACETAMINOPHEN 325 MG PO TABS
650.0000 mg | ORAL_TABLET | Freq: Four times a day (QID) | ORAL | Status: DC | PRN
  Administered 2014-03-17 – 2014-03-18 (×2): 650 mg via ORAL
  Filled 2014-03-17 (×2): qty 2

## 2014-03-17 MED ORDER — NICOTINE 21 MG/24HR TD PT24
21.0000 mg | MEDICATED_PATCH | Freq: Every day | TRANSDERMAL | Status: DC
  Administered 2014-03-17 – 2014-03-19 (×3): 21 mg via TRANSDERMAL
  Filled 2014-03-17 (×3): qty 1

## 2014-03-17 MED ORDER — VITAMIN B-1 100 MG PO TABS
100.0000 mg | ORAL_TABLET | Freq: Every day | ORAL | Status: DC
  Administered 2014-03-17 – 2014-03-19 (×3): 100 mg via ORAL
  Filled 2014-03-17 (×3): qty 1

## 2014-03-17 MED ORDER — ALUM & MAG HYDROXIDE-SIMETH 200-200-20 MG/5ML PO SUSP: 30.0000 mL | Freq: Four times a day (QID) | ORAL | Status: DC | PRN

## 2014-03-17 MED ORDER — ONDANSETRON HCL 4 MG PO TABS: 4.0000 mg | ORAL_TABLET | Freq: Four times a day (QID) | ORAL | Status: DC | PRN

## 2014-03-17 MED ORDER — ADULT MULTIVITAMIN W/MINERALS CH
1.0000 | ORAL_TABLET | Freq: Every day | ORAL | Status: DC
  Administered 2014-03-17 – 2014-03-19 (×3): 1 via ORAL
  Filled 2014-03-17 (×3): qty 1

## 2014-03-17 MED ORDER — LORAZEPAM 2 MG/ML IJ SOLN
2.0000 mg | INTRAMUSCULAR | Status: DC | PRN
  Administered 2014-03-17 – 2014-03-18 (×3): 2 mg via INTRAVENOUS
  Filled 2014-03-17 (×3): qty 1

## 2014-03-17 MED ORDER — FOLIC ACID 1 MG PO TABS
1.0000 mg | ORAL_TABLET | Freq: Every day | ORAL | Status: DC
  Administered 2014-03-17 – 2014-03-19 (×3): 1 mg via ORAL
  Filled 2014-03-17 (×3): qty 1

## 2014-03-17 MED ORDER — LORAZEPAM 2 MG/ML IJ SOLN
2.0000 mg | INTRAMUSCULAR | Status: DC
  Administered 2014-03-17 – 2014-03-19 (×9): 2 mg via INTRAVENOUS
  Filled 2014-03-17 (×11): qty 1

## 2014-03-17 MED ORDER — ACETAMINOPHEN 650 MG RE SUPP: 650.0000 mg | Freq: Four times a day (QID) | RECTAL | Status: DC | PRN

## 2014-03-17 NOTE — ED Provider Notes (Signed)
CSN: [id number]     Arrival date & time 03/17/14  [id number] History   First MD Initiated Contact with Patient 03/17/14 0357     Chief Complaint  Patient presents with  . Medical Clearance     (Consider location/radiation/quality/duration/timing/severity/associated sxs/prior Treatment) HPI Comments: Patient is an alcoholic who presents requesting detox he states he has had alcohol withdrawal seizures in the past also states that 4 days ago he went to [facility] he was given Ativan but he decided to leave and not pursue admission at that time he states he drinks 40 beers nightly.  The history is provided by the patient.    Past Medical History  Diagnosis Date  . Alcohol abuse   . Anxiety   . Depression   . Bipolar 2 disorder   . Active smoker    Past Surgical History  Procedure Laterality Date  . Lymph node dissection     Family History  Problem Relation Age of Onset  . Alcohol abuse Father    History  Substance Use Topics  . Smoking status: Current Every Day Smoker -- 2.00 packs/day for 20 years    Types: Cigarettes  . Smokeless tobacco: Never Used  . Alcohol Use: 151.2 oz/week    252 Cans of beer per week     Comment: heavy     Review of Systems  Constitutional: Negative for fever.  Respiratory: Negative for shortness of breath.   Gastrointestinal: Negative for abdominal pain.  Neurological: Negative for dizziness and headaches.  Psychiatric/Behavioral: Positive for suicidal ideas. The patient is nervous/anxious.   All other systems reviewed and are negative.     Allergies  Paxil  Home Medications   Prior to Admission medications   Medication Sig Start Date End Date Taking? Authorizing Provider  QUEtiapine (SEROQUEL) 25 MG tablet Take 50 mg by mouth at bedtime.    Yes Historical Provider, MD   BP 113/71  Pulse 95  Temp(Src) 97.9 F (36.6 C) (Oral)  Resp 18  SpO2 95% Physical Exam  Nursing note and vitals reviewed. Constitutional: He is  oriented to person, place, and time. He appears well-developed and well-nourished.  HENT:  Head: Normocephalic.  Eyes: Pupils are equal, round, and reactive to light.  Neck: Normal range of motion.  Cardiovascular: Regular rhythm.  Tachycardia present.   Pulmonary/Chest: Effort normal and breath sounds normal.  Abdominal: Soft. Bowel sounds are normal. He exhibits no distension.  Musculoskeletal: Normal range of motion.  Neurological: He is alert and oriented to person, place, and time.  Skin: There is pallor.    ED Course  Procedures (including critical care time) Labs Review Labs Reviewed  CBC - Abnormal; Notable for the following:    MCV 77.9 (*)    All other components within normal limits  COMPREHENSIVE METABOLIC PANEL - Abnormal; Notable for the following:    Glucose, Bld 113 (*)    BUN 5 (*)    Total Protein 8.4 (*)    AST 61 (*)    ALT 64 (*)    All other components within normal limits  ETHANOL - Abnormal; Notable for the following:    Alcohol, Ethyl (B) 190 (*)    All other components within normal limits  URINE RAPID DRUG SCREEN (HOSP PERFORMED) - Abnormal; Notable for the following:    Tetrahydrocannabinol POSITIVE (*)    All other components within normal limits    Imaging Review No results found.   EKG Interpretation None  MDM  Patient appears very shaky stating that he a seizure coming on Final diagnoses:  Alcohol withdrawal         Arman FilterGail K Lamonda Noxon, NP 03/17/14 [phone]

## 2014-03-17 NOTE — ED Provider Notes (Signed)
Medical screening examination/treatment/procedure(s) were performed by non-physician practitioner and as supervising physician I was immediately available for consultation/collaboration.   EKG Interpretation None       Skiler Olden M Dorien Bessent, MD 03/17/14 0617 

## 2014-03-17 NOTE — H&P (Addendum)
[facility] History and Physical  Dennis ChurchMichael C [name] [id number] DOB: 1971/08/16 DOA: 03/17/2014   PCP: No PCP Per Patient    Chief Complaint: ETOH withdrawl  HPI: [name] is a 43 y.o. male with ETOH abuse who only binge drinks and had been doing well not drinking for 6 months. He was going to [employer] meetings regularly and was [facility].    He resumed drinking 3 days ago and was drinking "as much [employer] as he could before passing out". He stopped at 8PM last night and started going in to withdrawal by early AM. He called the EMS to bring him in. Currently feels very shaky and anxious. Mild nausea.    General: The patient has had anorexia and weight loss over the past 2 months Cardiac: Denies chest pain, palpitations, pedal edema states he has been passing out over the past 3 days from heavy drinking Respiratory: Denies dyspnea on exertion, cough, shortness of breath, wheezing  GI: Denies severe indigestion/heartburn, abdominal pain, nausea, vomiting, diarrhea and constipation GU: Denies hematuria, incontinence, dysuria  Musculoskeletal: Joint pains "all the time" Skin: Denies suspicious skin lesions Neurologic: Denies focal weakness or numbness, change in vision  Past Medical History  Diagnosis Date  . Alcohol abuse   . Anxiety   . Depression   . Bipolar 2 disorder   . Active smoker    Past Surgical History  Procedure Laterality Date  . Lymph node dissection     Social History:  reports that he has been smoking Cigarettes.  1ppd for 20 yrs. He has never used smokeless tobacco. Drinking beer heavily for 3 days. He reports that he uses marijuana occassionally Lives in a house alone Good with ADLs  Allergies  Allergen Reactions  . Paxil [Paroxetine Hcl] Hives    Family History  Problem Relation Age of Onset  . Alcohol abuse Father       Prior to Admission medications   Medication Sig Start Date End  Date Taking? Authorizing Provider  NONE   Physical Exam: Filed Vitals:   03/18/14 0300 03/18/14 0400 03/18/14 0500 03/18/14 0600  BP:  90/58  91/44  Pulse: 67 62 71 66  Temp:  97.5 F (36.4 C)    TempSrc:  Oral    Resp: 16 15 16 14   Weight:  75.6 kg (166 lb 10.7 oz)    SpO2: 94% 96% 96% 96%     General: AAO x 3 HEENT: Normocephalic and Atraumatic, Mucous membranes pink                PERRLA; EOM intact; No scleral icterus,                 Nares: Patent, Oropharynx: Clear, Fair Dentition                 Neck: FROM, no cervical lymphadenopathy, thyromegaly, carotid bruit or JVD;  Breasts: deferred CHEST WALL: No tenderness  CHEST: Normal respiration, clear to auscultation bilaterally  HEART: Regular rate and rhythm; no murmurs rubs or gallops  BACK: No kyphosis or scoliosis; no CVA tenderness  ABDOMEN: Positive Bowel Sounds, soft, non-tender; no masses, no organomegaly Rectal Exam: deferred EXTREMITIES: No cyanosis, clubbing, or edema Genitalia: not examined  SKIN:  no rash or ulceration  CNS: Alert and Oriented x 4, Nonfocal exam, CN 2-12 intact  Labs on Admission:  Basic Metabolic Panel:  Recent Labs Lab 03/17/14 0402 03/18/14 0320  NA  143 142  K 4.0 3.6*  CL 103 106  CO2 23 29  GLUCOSE 113* 111*  BUN 5* 7  CREATININE 0.72 0.82  CALCIUM 9.0 8.5   Liver Function Tests:  Recent Labs Lab 03/17/14 0402 03/18/14 0320  AST 61* 41*  ALT 64* 44  ALKPHOS 63 46  BILITOT 0.7 1.1  PROT 8.4* 5.8*  ALBUMIN 4.5 2.9*   No results found for this basename: LIPASE, AMYLASE,  in the last 168 hours No results found for this basename: AMMONIA,  in the last 168 hours CBC:  Recent Labs Lab 03/17/14 0402 03/18/14 0320  WBC 7.6 7.0  HGB 15.6 13.5  HCT 45.1 41.1  MCV 77.9* 80.6  PLT 244 185   Cardiac Enzymes: No results found for this basename: CKTOTAL, CKMB, CKMBINDEX, TROPONINI,  in the last 168 hours  BNP (last 3 results) No results found for this basename:  PROBNP,  in the last 8760 hours CBG: No results found for this basename: GLUCAP,  in the last 168 hours  Radiological Exams on Admission: Dg Chest 2 View  03/17/2014   CLINICAL DATA:  Cough, congestion, shortness of breath.  Smoker.  EXAM: CHEST  2 VIEW  COMPARISON:  08/25/2005  FINDINGS: Normal heart size and pulmonary vascularity. Lungs appear clear and expanded. No focal airspace disease or consolidation. Hila are symmetrical. Old left rib fractures. No blunting of costophrenic angles. No pneumothorax.  IMPRESSION: No evidence of active pulmonary disease.   Electronically Signed   By: Burman NievesWilliam  Stevens M.D.   On: 03/17/2014 06:23    EKG: Independently reviewed. Sinus rhythm at 95bpm  Assessment/Plan Principal Problem:   Alcohol withdrawal/  Alcohol abuse - due to binging for 3 days -seems to be quite severe- will admit to step down and give liberal Ativan., folate, B12 and D5 infusion  Active Problems:    Marijuana abuse - advised to quit    Consulted:   Code Status: Full code  Family Communication: none  Disposition Plan: to be determined   Time spent: > 45 min  Troi Bechtold, MD [facility]  If 7PM-7AM, please contact night-coverage [email or web address] 03/18/2014, 7:21 AM

## 2014-03-17 NOTE — ED Notes (Signed)
rn started D5 NS fluids per hospitalist verbal order, while she was in the room

## 2014-03-17 NOTE — ED Notes (Signed)
Pt states he has been drinking heavily for the past 3 days and last drank about 9pm last night  Pt states he is very anxious, shaky, and nauseated

## 2014-03-17 NOTE — ED Notes (Signed)
Schulz, FNP at bedside. 

## 2014-03-17 NOTE — ED Notes (Signed)
Pt reports he has been binge drinking for 20 years. Reports he has been sober 5.5 months and then started binge drinking again 3 days ago.

## 2014-03-17 NOTE — ED Notes (Signed)
Per EMS pt was found sitting on a bench shaking with anxiety  Pt states he has been binge drinking for the past 3 days and wants to go to detox

## 2014-03-18 DIAGNOSIS — F10939 Alcohol use, unspecified with withdrawal, unspecified: Principal | ICD-10-CM

## 2014-03-18 DIAGNOSIS — F1210 Cannabis abuse, uncomplicated: Secondary | ICD-10-CM

## 2014-03-18 DIAGNOSIS — F10239 Alcohol dependence with withdrawal, unspecified: Principal | ICD-10-CM

## 2014-03-18 LAB — CBC
HCT: 41.1 % (ref 39.0–52.0)
Hemoglobin: 13.5 g/dL (ref 13.0–17.0)
MCH: 26.5 pg (ref 26.0–34.0)
MCHC: 32.8 g/dL (ref 30.0–36.0)
MCV: 80.6 fL (ref 78.0–100.0)
PLATELETS: 185 10*3/uL (ref 150–400)
RBC: 5.1 MIL/uL (ref 4.22–5.81)
RDW: 14.6 % (ref 11.5–15.5)
WBC: 7 10*3/uL (ref 4.0–10.5)

## 2014-03-18 LAB — COMPREHENSIVE METABOLIC PANEL
ALT: 44 U/L (ref 0–53)
AST: 41 U/L — ABNORMAL HIGH (ref 0–37)
Albumin: 2.9 g/dL — ABNORMAL LOW (ref 3.5–5.2)
Alkaline Phosphatase: 46 U/L (ref 39–117)
BILIRUBIN TOTAL: 1.1 mg/dL (ref 0.3–1.2)
BUN: 7 mg/dL (ref 6–23)
CHLORIDE: 106 meq/L (ref 96–112)
CO2: 29 mEq/L (ref 19–32)
Calcium: 8.5 mg/dL (ref 8.4–10.5)
Creatinine, Ser: 0.82 mg/dL (ref 0.50–1.35)
GFR calc non Af Amer: >90 mL/min (ref >90)
GLUCOSE: 111 mg/dL — AB (ref 70–99)
Potassium: 3.6 mEq/L — ABNORMAL LOW (ref 3.7–5.3)
Sodium: 142 mEq/L (ref 137–147)
Total Protein: 5.8 g/dL — ABNORMAL LOW (ref 6.0–8.3)

## 2014-03-18 LAB — MAGNESIUM: MAGNESIUM: 2.2 mg/dL (ref 1.5–2.5)

## 2014-03-18 MED ORDER — LORAZEPAM 2 MG/ML IJ SOLN
2.0000 mg | INTRAMUSCULAR | Status: DC | PRN
  Administered 2014-03-18 – 2014-03-19 (×3): 2 mg via INTRAVENOUS
  Filled 2014-03-18 (×2): qty 1

## 2014-03-18 MED ORDER — POTASSIUM CHLORIDE CRYS ER 20 MEQ PO TBCR
40.0000 meq | EXTENDED_RELEASE_TABLET | ORAL | Status: AC
  Administered 2014-03-18 (×2): 40 meq via ORAL
  Filled 2014-03-18 (×2): qty 2

## 2014-03-18 MED ORDER — CHLORDIAZEPOXIDE HCL 10 MG PO CAPS
10.0000 mg | ORAL_CAPSULE | Freq: Three times a day (TID) | ORAL | Status: DC
  Administered 2014-03-18 – 2014-03-19 (×2): 10 mg via ORAL
  Filled 2014-03-18 (×3): qty 1

## 2014-03-18 MED ORDER — CHLORDIAZEPOXIDE HCL 25 MG PO CAPS: 100.0000 mg | ORAL_CAPSULE | Freq: Three times a day (TID) | ORAL | Status: DC

## 2014-03-18 NOTE — Progress Notes (Signed)
[facility] Progress Note   [name] [id number] DOB: 1971-05-20 DOA: 03/17/2014 PCP: No PCP Per Patient  Brief narrative: [name] is a 43 y.o. male with ETOH abuse who only binge drinks and had been doing well not drinking for 6 months. He resumed drinking 3 days ago and was drinking "as much [employer] as he could before passing out". He stopped at 8PM last night and started going in to withdrawal by early AM.  Subjective: Feels much better today and asking if he can go home.   Assessment/Plan: Principal Problem:   Alcohol withdrawal - cont CIWA- start cutting back- allow to get out of bed  Active Problems:   Alcohol abuse - will ask social worker to evaluate- he already goes to AA meetings    Marijuana abuse - advised to quit    Code Status: Full code Family Communication: none Disposition Plan: home  Consultants: none  Procedures: none  Antibiotics: Antibiotics Given (last 72 hours)   None       DVT prophylaxis: lovenox  Objective: Filed Weights   03/18/14 0400  Weight: 75.6 kg (166 lb 10.7 oz)    Vitals Filed Vitals:   03/18/14 0500 03/18/14 0600 03/18/14 0749 03/18/14 0800  BP:  91/44  116/66  Pulse: 71 66  80  Temp:   97.4 F (36.3 C)   TempSrc:   Oral   Resp: 16 14  16   Weight:      SpO2: 96% 96%  93%     Intake/Output Summary (Last 24 hours) at 03/18/14 1127 Last data filed at 03/18/14 1100  Gross per 24 hour  Intake   3300 ml  Output   2300 ml  Net   1000 ml     Exam: General: No acute respiratory distress Lungs: Clear to auscultation bilaterally without wheezes or crackles Cardiovascular: Regular rate and rhythm without murmur gallop or rub normal S1 and S2 Abdomen: Nontender, nondistended, soft, bowel sounds positive, no rebound, no ascites, no appreciable mass Extremities: No significant cyanosis, clubbing, or edema bilateral lower extremities  Data Reviewed: Basic Metabolic  Panel:  Recent Labs Lab 03/17/14 0402 03/18/14 0320  NA 143 142  K 4.0 3.6*  CL 103 106  CO2 23 29  GLUCOSE 113* 111*  BUN 5* 7  CREATININE 0.72 0.82  CALCIUM 9.0 8.5  MG  --  2.2   Liver Function Tests:  Recent Labs Lab 03/17/14 0402 03/18/14 0320  AST 61* 41*  ALT 64* 44  ALKPHOS 63 46  BILITOT 0.7 1.1  PROT 8.4* 5.8*  ALBUMIN 4.5 2.9*   No results found for this basename: LIPASE, AMYLASE,  in the last 168 hours No results found for this basename: AMMONIA,  in the last 168 hours CBC:  Recent Labs Lab 03/17/14 0402 03/18/14 0320  WBC 7.6 7.0  HGB 15.6 13.5  HCT 45.1 41.1  MCV 77.9* 80.6  PLT 244 185   Cardiac Enzymes: No results found for this basename: CKTOTAL, CKMB, CKMBINDEX, TROPONINI,  in the last 168 hours BNP (last 3 results) No results found for this basename: PROBNP,  in the last 8760 hours CBG: No results found for this basename: GLUCAP,  in the last 168 hours  Recent Results (from the past 240 hour(s))  MRSA PCR SCREENING     Status: None   Collection Time    03/17/14  9:50 AM      Result Value Ref Range Status   MRSA by PCR  NEGATIVE  NEGATIVE Final   Comment:            The GeneXpert MRSA Assay (FDA     approved for NASAL specimens     only), is one component of a     comprehensive MRSA colonization     surveillance program. It is not     intended to diagnose MRSA     infection nor to guide or     monitor treatment for     MRSA infections.     Studies:  Recent x-Dennis studies have been reviewed in detail by the Attending Physician  Scheduled Meds:  Scheduled Meds: . enoxaparin (LOVENOX) injection  40 mg Subcutaneous Q24H  . folic acid  1 mg Oral Daily  . LORazepam  2 mg Intravenous Q4H  . multivitamin with minerals  1 tablet Oral Daily  . nicotine  21 mg Transdermal Daily  . potassium chloride  40 mEq Oral Q4H  . thiamine  100 mg Oral Daily   Continuous Infusions: . dextrose 5 % and 0.9% NaCl 125 mL/hr at 03/18/14 [id number]     Time spent on care of this patient: 30 min   [name], MD 03/18/2014, 11:27 AM  LOS: 1 day   [facility] Office  [phone] Pager - Text Page per Loretha StaplerAmion   If 7PM-7AM, please contact night-coverage [email or web address]

## 2014-03-19 MED ORDER — LORAZEPAM 1 MG PO TABS: 2.0000 mg | ORAL_TABLET | ORAL | Status: DC | PRN

## 2014-03-22 NOTE — Discharge Summary (Signed)
Physician Discharge Summary  Dennis ChurchMichael C Citro QQV:[id number] DOB: Aug 21, 1971 DOA: 03/17/2014  PCP: No PCP Per Patient  Admit date: 03/17/2014 Discharge date: 03/22/2014  Time spent: >45 minutes  PATIENT HAS LEFT AMA  Discharge Diagnoses:  Principal Problem:   Alcohol withdrawal Active Problems:   Alcohol abuse   Marijuana abuse   Discharge Condition: fair  Diet recommendation: at tolerated  Filed Weights   03/18/14 0400 03/19/14 0400  Weight: 75.6 kg (166 lb 10.7 oz) 74.9 kg (165 lb 2 oz)    History of present illness:  [name] is a 43 y.o. male with ETOH abuse who only binge drinks and had been doing well not drinking for 6 months.  He resumed drinking 3 days ago and was drinking "as much [employer] as he could before passing out". He stopped at 8PM last night and started going in to withdrawal by early AM.   Hospital Course:  Principal Problem:  Alcohol withdrawal  - still having withdrawal symptoms and requiring PRN Ativan in addition to routinely ordered Ativan  - anxious to go home but advised to stay in the hospital until withdrawal symptoms have resolved-  - unwilling to stay and signing out AMA  Active Problems:  Alcohol abuse  -  asked social worker to evaluate- he already goes to AA meetings   Marijuana abuse  - advised to quit   Procedures:  none  Consultations:  none  Discharge Exam: Filed Vitals:   03/19/14 0825  BP: 129/79  Pulse: 80  Temp:   Resp:     General: alert, oriented x 3 Cardiovascular: RRR, no murmurs Respiratory: CTA b /l   Discharge Instructions You were cared for by a hospitalist during your hospital stay. If you have any questions about your discharge medications or the care you received while you were in the hospital after you are discharged, you can call the unit and asked to speak with the hospitalist on call if the hospitalist that took care of you is not available. Once you are discharged, your primary  care physician will handle any further medical issues. Please note that NO REFILLS for any discharge medications will be authorized once you are discharged, as it is imperative that you return to your primary care physician (or establish a relationship with a primary care physician if you do not have one) for your aftercare needs so that they can reassess your need for medications and monitor your lab values.   Medications:  Although med/rec states Seroquel, he states he was not on any medications prior to arrival and has not been given prescriptions due to his decision to leave AMA  Allergies  Allergen Reactions  . Paxil [Paroxetine Hcl] Hives      The results of significant diagnostics from this hospitalization (including imaging, microbiology, ancillary and laboratory) are listed below for reference.    Significant Diagnostic Studies: Dg Chest 2 View  03/17/2014   CLINICAL DATA:  Cough, congestion, shortness of breath.  Smoker.  EXAM: CHEST  2 VIEW  COMPARISON:  08/25/2005  FINDINGS: Normal heart size and pulmonary vascularity. Lungs appear clear and expanded. No focal airspace disease or consolidation. Hila are symmetrical. Old left rib fractures. No blunting of costophrenic angles. No pneumothorax.  IMPRESSION: No evidence of active pulmonary disease.   Electronically Signed   By: Burman NievesWilliam  Stevens M.D.   On: 03/17/2014 06:23    Microbiology: Recent Results (from the past 240 hour(s))  MRSA PCR SCREENING  Status: None   Collection Time    03/17/14  9:50 AM      Result Value Ref Range Status   MRSA by PCR NEGATIVE  NEGATIVE Final   Comment:            The GeneXpert MRSA Assay (FDA     approved for NASAL specimens     only), is one component of a     comprehensive MRSA colonization     surveillance program. It is not     intended to diagnose MRSA     infection nor to guide or     monitor treatment for     MRSA infections.     Labs: Basic Metabolic Panel:  Recent  Labs Lab 03/17/14 0402 03/18/14 0320  NA 143 142  K 4.0 3.6*  CL 103 106  CO2 23 29  GLUCOSE 113* 111*  BUN 5* 7  CREATININE 0.72 0.82  CALCIUM 9.0 8.5  MG  --  2.2   Liver Function Tests:  Recent Labs Lab 03/17/14 0402 03/18/14 0320  AST 61* 41*  ALT 64* 44  ALKPHOS 63 46  BILITOT 0.7 1.1  PROT 8.4* 5.8*  ALBUMIN 4.5 2.9*   No results found for this basename: LIPASE, AMYLASE,  in the last 168 hours No results found for this basename: AMMONIA,  in the last 168 hours CBC:  Recent Labs Lab 03/17/14 0402 03/18/14 0320  WBC 7.6 7.0  HGB 15.6 13.5  HCT 45.1 41.1  MCV 77.9* 80.6  PLT 244 185   Cardiac Enzymes: No results found for this basename: CKTOTAL, CKMB, CKMBINDEX, TROPONINI,  in the last 168 hours BNP: BNP (last 3 results) No results found for this basename: PROBNP,  in the last 8760 hours CBG: No results found for this basename: GLUCAP,  in the last 168 hours     Signed:  [name],Jani Moronta, MD [facility] 03/22/2014, 6:51 PM

## 2016-11-25 ENCOUNTER — Encounter (HOSPITAL_COMMUNITY): Payer: Self-pay

## 2016-11-25 ENCOUNTER — Emergency Department (HOSPITAL_COMMUNITY)
Admission: EM | Admit: 2016-11-25 | Discharge: 2016-11-25 | Disposition: A | Discharge status: Home / Self Care | Payer: BLUE CROSS/BLUE SHIELD | Attending: Physician Assistant | Admitting: Physician Assistant

## 2016-11-25 DIAGNOSIS — F10129 Alcohol abuse with intoxication, unspecified: Secondary | ICD-10-CM | POA: Diagnosis not present

## 2016-11-25 DIAGNOSIS — F17210 Nicotine dependence, cigarettes, uncomplicated: Secondary | ICD-10-CM | POA: Diagnosis not present

## 2016-11-25 DIAGNOSIS — F10920 Alcohol use, unspecified with intoxication, uncomplicated: Secondary | ICD-10-CM

## 2016-11-25 LAB — COMPREHENSIVE METABOLIC PANEL
ALT: 39 U/L (ref 17–63)
AST: 51 U/L — ABNORMAL HIGH (ref 15–41)
Albumin: 4.5 g/dL (ref 3.5–5.0)
Alkaline Phosphatase: 53 U/L (ref 38–126)
Anion gap: 12 (ref 5–15)
BUN: 6 mg/dL (ref 6–20)
CALCIUM: 9.1 mg/dL (ref 8.9–10.3)
CHLORIDE: 105 mmol/L (ref 101–111)
CO2: 24 mmol/L (ref 22–32)
CREATININE: 0.82 mg/dL (ref 0.61–1.24)
GFR calc Af Amer: >60 mL/min (ref >60)
Glucose, Bld: 131 mg/dL — ABNORMAL HIGH (ref 65–99)
Potassium: 3.7 mmol/L (ref 3.5–5.1)
Sodium: 141 mmol/L (ref 135–145)
Total Bilirubin: 1.1 mg/dL (ref 0.3–1.2)
Total Protein: 7.9 g/dL (ref 6.5–8.1)

## 2016-11-25 LAB — CBC
HEMATOCRIT: 44.8 % (ref 39.0–52.0)
Hemoglobin: 15.3 g/dL (ref 13.0–17.0)
MCH: 27 pg (ref 26.0–34.0)
MCHC: 34.2 g/dL (ref 30.0–36.0)
MCV: 79.2 fL (ref 78.0–100.0)
PLATELETS: 229 10*3/uL (ref 150–400)
RBC: 5.66 MIL/uL (ref 4.22–5.81)
RDW: 14.8 % (ref 11.5–15.5)
WBC: 8.7 10*3/uL (ref 4.0–10.5)

## 2016-11-25 LAB — ETHANOL: Alcohol, Ethyl (B): 328 mg/dL (ref <5)

## 2016-11-25 MED ORDER — LORAZEPAM 2 MG/ML IJ SOLN
2.0000 mg | Freq: Once | INTRAMUSCULAR | Status: AC
  Administered 2016-11-25: 2 mg via INTRAVENOUS
  Filled 2016-11-25: qty 1

## 2016-11-25 MED ORDER — LORAZEPAM 2 MG/ML IJ SOLN: 1.0000 mg | Freq: Once | INTRAMUSCULAR | Status: DC

## 2016-11-25 MED ORDER — CHLORDIAZEPOXIDE HCL 25 MG PO CAPS: ORAL_CAPSULE | ORAL | 0 refills | Status: DC

## 2016-11-25 MED ORDER — SODIUM CHLORIDE 0.9 % IV BOLUS (SEPSIS)
1000.0000 mL | Freq: Once | INTRAVENOUS | Status: AC
  Administered 2016-11-25: 1000 mL via INTRAVENOUS

## 2016-11-25 NOTE — ED Triage Notes (Addendum)
Pt was brought in by his girlfriend for alcohol intoxication. She reports he has been drinking non-stop for the past 6 days "cases of beer." Pt reports he has PTSD from being in the [employer]. Pt is hallucinating, hearing voices and seeing things that are not real. Pt currently having delusions of persecution and unable to sit still. Denies illegal drug use, states only drinking alcohol and this is a binge episode and denies drinking everyday.

## 2016-11-25 NOTE — ED Provider Notes (Signed)
[facility]-EMERGENCY DEPT Provider Note   CSN: [id number] Arrival date & time: 11/25/16  1733     History   Chief Complaint Chief Complaint  Patient presents with  . Alcohol Intoxication    HPI [name] is a 46 y.o. male. Pt with hx of alcohol abuse and prior alcohol withdrawal presents with concern of going into acohol w/d. Pt reports he has been binge drinking for the last 6 days. Has been drinking about 2 cases per day. His last drink was just prior to coming to the hospital tonight. Denies any SI/HI. Reports hx of PTSD and states he drinks when he gets PTSD sx. Prior to 6 days ago, his last drink was over a month ago. He does have hx of being admitted before with w/d. He reports anxiety but denies any A/V hallucinations.  HPI  Past Medical History:  Diagnosis Date  . Active smoker   . Alcohol abuse   . Anxiety   . Bipolar 2 disorder (HCC)   . Depression     Patient Active Problem List   Diagnosis Date Noted  . GI bleed 10/06/2013  . Marijuana abuse 10/06/2013  . Alcohol withdrawal (HCC) 10/05/2013  . Suicidal ideation 10/05/2013  . Alcohol abuse 10/26/2011  . Alcohol withdrawal syndrome (HCC) 10/26/2011  . Depression 10/26/2011    Past Surgical History:  Procedure Laterality Date  . LYMPH NODE DISSECTION         Home Medications    Prior to Admission medications   Medication Sig Start Date End Date Taking? Authorizing Provider  tadalafil (CIALIS) 5 MG tablet Take 5 mg by mouth every 3 (three) days.   Yes Historical Provider, MD  chlordiazePOXIDE (LIBRIUM) 25 MG capsule 50mg  PO QID x 1D, then 25mg  PO QID X 1D, then 25mg  PO BID X 1D, then 25mg  PO once x 1D 11/25/16   Lennette BihariJohn Eugean Clinton Dragone, MD    Family History Family History  Problem Relation Age of Onset  . Alcohol abuse Father     Social History Social History  Substance Use Topics  . Smoking status: Current Every Day Smoker    Packs/day: 2.00    Years: 20.00    Types: Cigarettes  . Smokeless  tobacco: Never Used  . Alcohol use 151.2 oz/week    252 Cans of beer per week     Comment: heavy      Allergies   Paxil [paroxetine hcl]   Review of Systems Review of Systems  Constitutional: Negative for chills and fever.  HENT: Negative for ear pain and sore throat.   Eyes: Negative for pain and visual disturbance.  Respiratory: Negative for cough and shortness of breath.   Cardiovascular: Negative for chest pain and palpitations.  Gastrointestinal: Negative for abdominal pain and vomiting.  Genitourinary: Negative for dysuria and hematuria.  Musculoskeletal: Negative for arthralgias and back pain.  Skin: Negative for color change and rash.  Neurological: Negative for seizures and syncope.  Psychiatric/Behavioral: Positive for agitation. Negative for confusion, hallucinations, self-injury and suicidal ideas. The patient is nervous/anxious.   All other systems reviewed and are negative.    Physical Exam Updated Vital Signs BP 125/63   Pulse 119   Temp 98.2 F (36.8 C) (Oral)   Resp 24   SpO2 92%   Physical Exam  Constitutional: He is oriented to person, place, and time. He appears well-developed and well-nourished. No distress.  HENT:  Head: Normocephalic and atraumatic.  Eyes: Conjunctivae are normal.  Neck: Neck supple.  Cardiovascular: Regular rhythm and intact distal pulses.   No murmur heard. Mild tachycardia (104)  Pulmonary/Chest: Effort normal and breath sounds normal. No respiratory distress. He has no wheezes. He has no rales.  Abdominal: Soft. He exhibits no distension and no mass. There is no tenderness. There is no rebound and no guarding.  Musculoskeletal: He exhibits no edema, tenderness or deformity.  Neurological: He is alert and oriented to person, place, and time. No cranial nerve deficit.  Strength 5/5 in all extremities. Ambulating without difficulty.  Skin: Skin is warm and dry.  Psychiatric: He has a normal mood and affect.  Nursing note  and vitals reviewed.    ED Treatments / Results  Labs (all labs ordered are listed, but only abnormal results are displayed) Labs Reviewed  COMPREHENSIVE METABOLIC PANEL - Abnormal; Notable for the following:       Result Value   Glucose, Bld 131 (*)    AST 51 (*)    All other components within normal limits  ETHANOL - Abnormal; Notable for the following:    Alcohol, Ethyl (B) 328 (*)    All other components within normal limits  CBC    EKG  EKG Interpretation None       Radiology No results found.  Procedures Procedures (including critical care time)  Medications Ordered in ED Medications  sodium chloride 0.9 % bolus 1,000 mL (0 mLs Intravenous Stopped 11/25/16 2356)  LORazepam (ATIVAN) injection 2 mg (2 mg Intravenous Given 11/25/16 2230)  sodium chloride 0.9 % bolus 1,000 mL (0 mLs Intravenous Stopped 11/25/16 2356)     Initial Impression / Assessment and Plan / ED Course  I have reviewed the triage vital signs and the nursing notes.  Pertinent labs & imaging results that were available during my care of the patient were reviewed by me and considered in my medical decision making (see chart for details).    Pt with hx as above presents with EtOH use and concern of going into w/d. Reports hx of seizure and withdrawal in the past. Last use just prior to arrival. Here, he is mildly tachycardic but mentating well and no evidence of tremor. There was a triage note about him reporting a seizure in the waiting room. No post-ictal phase. Pt alert and oriented when he arrives in the trauma bay. I do not think he had a seizure. Pt was given 2L NS in the ED and 2mg  Ativan Iv. He had improvement in his sx of anxiety. His HR also improved. BP is in normal range. No evidence of severe alcohol withdrawal at this time. Pt is here with his girlfriend who lives with him and reports he has stopped drinking many times. Pt reports interest in remaining abstinent from alcohol. His concern is  avoiding alcohol withdrawal. I spoke at length with patient and his girlfriend about outpatient Librium taper. They would like to be discharged with librium taper. I discussed the taper in detail. I also discussed return precautions in detail. Pt remains mildly tachycardic but BP is normal. He has no tremor. He has been tolerating PO in his room. Pt stable for discharge.  Final Clinical Impressions(s) / ED Diagnoses   Final diagnoses:  Alcoholic intoxication without complication [facility])    New Prescriptions Discharge Medication List as of 11/25/2016 11:40 PM    START taking these medications   Details  chlordiazePOXIDE (LIBRIUM) 25 MG capsule 50mg  PO QID x 1D, then 25mg  PO QID X 1D, then 25mg  PO BID X  1D, then 25mg  PO once x 1D, Print         Lennette Bihari, MD 11/26/16 1610    Abelino Derrick, MD 11/27/16 1323

## 2016-11-25 NOTE — ED Notes (Signed)
Carla RN came to NF advising pt needed room he was having sezure in triage. [facility]

## 2016-11-25 NOTE — ED Notes (Signed)
Pt ambulatory to bathroom without any problems 

## 2016-11-25 NOTE — ED Notes (Signed)
Attempted to Musiciancontact Charge RN multiple times via radio and phone and unable to get through before moving pt to TC.

## 2016-11-25 NOTE — ED Notes (Addendum)
Pt is asking to eat.

## 2016-11-25 NOTE — ED Notes (Signed)
EMT informed RN that pt was seizing; upon RN arrival pt had slight twitching but was a&ox 4; pt did look a little "out of it" and was diaphoretic; family attempting to give pt water and to sit pt upright in chair; last known drink is in the parking lot prior to checking in via family member; IV started and pt moved to room

## 2016-11-25 NOTE — ED Notes (Signed)
Lab called ETOH [id number],

## 2017-03-02 ENCOUNTER — Inpatient Hospital Stay (HOSPITAL_COMMUNITY)
Admission: EM | Admit: 2017-03-02 | Discharge: 2017-03-02 | DRG: 897 | Disposition: A | Discharge status: Home / Self Care | Payer: BLUE CROSS/BLUE SHIELD | Attending: Pulmonary Disease | Admitting: Pulmonary Disease

## 2017-03-02 ENCOUNTER — Encounter (HOSPITAL_COMMUNITY): Payer: Self-pay | Admitting: Emergency Medicine

## 2017-03-02 DIAGNOSIS — Y908 Blood alcohol level of 240 mg/100 ml or more: Secondary | ICD-10-CM | POA: Diagnosis present

## 2017-03-02 DIAGNOSIS — F10129 Alcohol abuse with intoxication, unspecified: Principal | ICD-10-CM | POA: Diagnosis present

## 2017-03-02 DIAGNOSIS — F17210 Nicotine dependence, cigarettes, uncomplicated: Secondary | ICD-10-CM | POA: Diagnosis present

## 2017-03-02 DIAGNOSIS — Z888 Allergy status to other drugs, medicaments and biological substances status: Secondary | ICD-10-CM | POA: Diagnosis not present

## 2017-03-02 DIAGNOSIS — Z79899 Other long term (current) drug therapy: Secondary | ICD-10-CM | POA: Diagnosis not present

## 2017-03-02 DIAGNOSIS — F3181 Bipolar II disorder: Secondary | ICD-10-CM | POA: Diagnosis present

## 2017-03-02 DIAGNOSIS — F10931 Alcohol use, unspecified with withdrawal delirium: Secondary | ICD-10-CM | POA: Diagnosis present

## 2017-03-02 DIAGNOSIS — F419 Anxiety disorder, unspecified: Secondary | ICD-10-CM | POA: Diagnosis present

## 2017-03-02 DIAGNOSIS — F1010 Alcohol abuse, uncomplicated: Secondary | ICD-10-CM

## 2017-03-02 DIAGNOSIS — F10231 Alcohol dependence with withdrawal delirium: Secondary | ICD-10-CM | POA: Diagnosis present

## 2017-03-02 LAB — RAPID URINE DRUG SCREEN, HOSP PERFORMED
AMPHETAMINES: NOT DETECTED
BARBITURATES: NOT DETECTED
Benzodiazepines: NOT DETECTED
Cocaine: NOT DETECTED
Opiates: NOT DETECTED
TETRAHYDROCANNABINOL: NOT DETECTED

## 2017-03-02 LAB — CBC
HEMATOCRIT: 42.3 % (ref 39.0–52.0)
HEMOGLOBIN: 14.3 g/dL (ref 13.0–17.0)
MCH: 26.7 pg (ref 26.0–34.0)
MCHC: 33.8 g/dL (ref 30.0–36.0)
MCV: 79.1 fL (ref 78.0–100.0)
Platelets: 229 10*3/uL (ref 150–400)
RBC: 5.35 MIL/uL (ref 4.22–5.81)
RDW: 14.4 % (ref 11.5–15.5)
WBC: 8.4 10*3/uL (ref 4.0–10.5)

## 2017-03-02 LAB — COMPREHENSIVE METABOLIC PANEL
ALBUMIN: 4.6 g/dL (ref 3.5–5.0)
ALT: 30 U/L (ref 17–63)
AST: 40 U/L (ref 15–41)
Alkaline Phosphatase: 58 U/L (ref 38–126)
Anion gap: 9 (ref 5–15)
BILIRUBIN TOTAL: 0.5 mg/dL (ref 0.3–1.2)
BUN: 7 mg/dL (ref 6–20)
CHLORIDE: 108 mmol/L (ref 101–111)
CO2: 24 mmol/L (ref 22–32)
Calcium: 8.8 mg/dL — ABNORMAL LOW (ref 8.9–10.3)
Creatinine, Ser: 0.7 mg/dL (ref 0.61–1.24)
GFR calc Af Amer: >60 mL/min (ref >60)
GFR calc non Af Amer: >60 mL/min (ref >60)
GLUCOSE: 89 mg/dL (ref 65–99)
Potassium: 3.8 mmol/L (ref 3.5–5.1)
SODIUM: 141 mmol/L (ref 135–145)
TOTAL PROTEIN: 8.2 g/dL — AB (ref 6.5–8.1)

## 2017-03-02 LAB — ETHANOL: Alcohol, Ethyl (B): 304 mg/dL (ref <5)

## 2017-03-02 MED ORDER — HEPARIN SODIUM (PORCINE) 5000 UNIT/ML IJ SOLN: 5000.0000 [IU] | Freq: Three times a day (TID) | INTRAMUSCULAR | Status: DC

## 2017-03-02 MED ORDER — FOLIC ACID 5 MG/ML IJ SOLN: 1.0000 mg | Freq: Every day | INTRAMUSCULAR | Status: DC

## 2017-03-02 MED ORDER — FENTANYL CITRATE (PF) 100 MCG/2ML IJ SOLN: 25.0000 ug | INTRAMUSCULAR | Status: DC | PRN

## 2017-03-02 MED ORDER — DEXMEDETOMIDINE HCL IN NACL 400 MCG/100ML IV SOLN
0.2000 ug/kg/h | INTRAVENOUS | Status: DC
  Filled 2017-03-02: qty 100

## 2017-03-02 MED ORDER — LORAZEPAM 2 MG/ML IJ SOLN
2.0000 mg | Freq: Once | INTRAMUSCULAR | Status: AC
  Administered 2017-03-02: 2 mg via INTRAVENOUS
  Filled 2017-03-02: qty 1

## 2017-03-02 MED ORDER — ACETAMINOPHEN 650 MG RE SUPP: 650.0000 mg | RECTAL | Status: DC | PRN

## 2017-03-02 MED ORDER — DEXMEDETOMIDINE HCL IN NACL 200 MCG/50ML IV SOLN
0.2000 ug/kg/h | INTRAVENOUS | Status: DC
Start: 2017-03-02 — End: 2017-03-02
  Filled 2017-03-02: qty 50

## 2017-03-02 MED ORDER — DEXTROSE-NACL 5-0.9 % IV SOLN: INTRAVENOUS | Status: DC

## 2017-03-02 MED ORDER — THIAMINE HCL 100 MG/ML IJ SOLN: 100.0000 mg | Freq: Every day | INTRAMUSCULAR | Status: DC

## 2017-03-02 MED ORDER — ACETAMINOPHEN 325 MG PO TABS: 650.0000 mg | ORAL_TABLET | Freq: Four times a day (QID) | ORAL | Status: DC | PRN

## 2017-03-02 MED ORDER — CHLORDIAZEPOXIDE HCL 25 MG PO CAPS: ORAL_CAPSULE | ORAL | 0 refills | Status: DC

## 2017-03-02 MED ORDER — LORAZEPAM 2 MG/ML IJ SOLN: 1.0000 mg | INTRAMUSCULAR | Status: DC | PRN

## 2017-03-02 MED ORDER — CHLORDIAZEPOXIDE HCL 25 MG PO CAPS
100.0000 mg | ORAL_CAPSULE | Freq: Once | ORAL | Status: AC
  Administered 2017-03-02: 100 mg via ORAL
  Filled 2017-03-02: qty 4

## 2017-03-02 NOTE — ED Notes (Signed)
ED Provider at bedside. 

## 2017-03-02 NOTE — ED Triage Notes (Signed)
Pt verbalizes ETOH withdrawals related to no ETOH since 0500 this morning; pt verbalizes has had 100 beers a day for the past 5-6 days related to depression and "feels bad for my boys on [employer]." Pt denies SI/HI but verbalizes depression. Pt verbalizes pain all over and pain and needles in head.

## 2017-03-02 NOTE — ED Provider Notes (Signed)
[facility]-EMERGENCY DEPT Provider Note   CSN: [id number] Arrival date & time: 03/02/17  1234     History   Chief Complaint Chief Complaint  Patient presents with  . Alcohol Intoxication    HPI [name] is a 46 y.o. male.  46 yo M with a chief complaint of alcohol intoxication. Patient feels that he is withdrawing. Has not had anything the drink since this morning. He is depressed because of its Memorial Day. 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 He does not feel that he is safe at home and would like to stay in the emergency department [facility]. He denies suicidal or homicidal ideation. Denies seizure activity.   The history is provided by the patient.  Alcohol Intoxication  This is a new problem. The current episode started more than 2 days ago. The problem occurs constantly. The problem has not changed since onset.Pertinent negatives include no chest pain, no abdominal pain, no headaches and no shortness of breath. Nothing aggravates the symptoms. Nothing relieves the symptoms. He has tried nothing for the symptoms. The treatment provided no relief.    Past Medical History:  Diagnosis Date  . Active smoker   . Alcohol abuse   . Anxiety   . Bipolar 2 disorder (HCC)   . Depression     Patient Active Problem List   Diagnosis Date Noted  . GI bleed 10/06/2013  . Marijuana abuse 10/06/2013  . Alcohol withdrawal (HCC) 10/05/2013  . Suicidal ideation 10/05/2013  . Alcohol abuse 10/26/2011  . Alcohol withdrawal syndrome (HCC) 10/26/2011  . Depression 10/26/2011    Past Surgical History:  Procedure Laterality Date  . LYMPH NODE DISSECTION         Home Medications    Prior to Admission medications   Medication Sig Start Date End Date Taking? Authorizing Provider  chlordiazePOXIDE (LIBRIUM) 25 MG capsule 50mg  PO QID x 1D, then 25mg  PO QID X 1D, then 25mg  PO BID X 1D, then 25mg  PO once x 1D 11/25/16   Lennette BihariButler, John  Jac, MD  chlordiazePOXIDE (LIBRIUM) 25 MG capsule 50mg  PO TID x 1D, then 25-50mg  PO BID X 1D, then 25-50mg  PO QD X 1D 03/02/17   Melene PlanFloyd, Deone Omahoney, DO  tadalafil (CIALIS) 5 MG tablet Take 5 mg by mouth every 3 (three) days.    [provider]    Family History Family History  Problem Relation Age of Onset  . Alcohol abuse Father     Social History Social History  Substance Use Topics  . Smoking status: Current Every Day Smoker    Packs/day: 2.00    Years: 20.00    Types: Cigarettes  . Smokeless tobacco: Never Used  . Alcohol use 151.2 oz/week    252 Cans of beer per week     Comment: heavy      Allergies   Paxil [paroxetine hcl]   Review of Systems Review of Systems  Constitutional: Negative for chills and fever.  HENT: Negative for congestion and facial swelling.   Eyes: Negative for discharge and visual disturbance.  Respiratory: Negative for shortness of breath.   Cardiovascular: Negative for chest pain and palpitations.  Gastrointestinal: Negative for abdominal pain, diarrhea and vomiting.  Musculoskeletal: Negative for arthralgias and myalgias.  Skin: Negative for color change and rash.  Neurological: Negative for tremors, syncope and headaches.  Psychiatric/Behavioral: Negative for confusion and dysphoric mood.     Physical Exam Updated Vital Signs  BP 132/82 (BP Location: Right Arm)   Pulse 90   Temp 98.2 F (36.8 C) (Oral)   Resp 17   Ht 5\' 9"  (1.753 m)   Wt 77.1 kg (170 lb)   SpO2 100%   BMI 25.10 kg/m   Physical Exam  Constitutional: He is oriented to person, place, and time. He appears well-developed and well-nourished.  HENT:  Head: Normocephalic and atraumatic.  Eyes: EOM are normal. Pupils are equal, round, and reactive to light.  Neck: Normal range of motion. Neck supple. No JVD present.  Cardiovascular: Normal rate and regular rhythm.  Exam reveals no gallop and no friction rub.   No murmur heard. Pulmonary/Chest: No respiratory  distress. He has no wheezes.  Abdominal: He exhibits no distension and no mass. There is no tenderness. There is no rebound and no guarding.  Musculoskeletal: Normal range of motion.  Neurological: He is alert and oriented to person, place, and time.  Tremor that resolves when distracted  Skin: No rash noted. No pallor.  Psychiatric: He has a normal mood and affect. His behavior is normal.  Nursing note and vitals reviewed.    ED Treatments / Results  Labs (all labs ordered are listed, but only abnormal results are displayed) Labs Reviewed  COMPREHENSIVE METABOLIC PANEL - Abnormal; Notable for the following:       Result Value   Calcium 8.8 (*)    Total Protein 8.2 (*)    All other components within normal limits  ETHANOL - Abnormal; Notable for the following:    Alcohol, Ethyl (B) 304 (*)    All other components within normal limits  CBC  RAPID URINE DRUG SCREEN, HOSP PERFORMED    EKG  EKG Interpretation None       Radiology No results found.  Procedures Procedures (including critical care time)  Medications Ordered in ED Medications  LORazepam (ATIVAN) injection 2 mg (2 mg Intravenous Given 03/02/17 1357)  chlordiazePOXIDE (LIBRIUM) capsule 100 mg (100 mg Oral Given 03/02/17 1356)     Initial Impression / Assessment and Plan / ED Course  I have reviewed the triage vital signs and the nursing notes.  Pertinent labs & imaging results that were available during my care of the patient were reviewed by me and considered in my medical decision making (see chart for details).     46 yo M With a chief complaint of alcohol withdrawal. The patient clinically appears to be intoxicated. He is not hypertensive or tachycardic. He is clinically sober and able to ambulate without difficulty. I discussed with the patient that we cannot hold him in the emergency department. If he felt unsafe then we can put him in the psychiatric unit and have him evaluated. The patient did not  feel that that was necessary stated that he was not homicidal or suicidal. He is requesting discharge at this time. Given a list of outpatient resources for rehabilitation.  2:55 PM:  I have discussed the diagnosis/risks/treatment options with the patient and family and believe the pt to be eligible for discharge home to follow-up with PCP. We also discussed returning to the ED immediately if new or worsening sx occur. We discussed the sx which are most concerning (e.g., sudden worsening pain, fever, inability to tolerate by mouth) that necessitate immediate return. Medications administered to the patient during their visit and any new prescriptions provided to the patient are listed below.  Medications given during this visit Medications  LORazepam (ATIVAN) injection 2 mg (2 mg  Intravenous Given 03/02/17 1357)  chlordiazePOXIDE (LIBRIUM) capsule 100 mg (100 mg Oral Given 03/02/17 1356)     The patient appears reasonably screen and/or stabilized for discharge and I doubt any other medical condition or other [facility] requiring further screening, evaluation, or treatment in the ED at this time prior to discharge.    Final Clinical Impressions(s) / ED Diagnoses   Final diagnoses:  ETOH abuse    New Prescriptions New Prescriptions   CHLORDIAZEPOXIDE (LIBRIUM) 25 MG CAPSULE    50mg  PO TID x 1D, then 25-50mg  PO BID X 1D, then 25-50mg  PO QD X 1D     Melene Plan, DO 03/02/17 1455

## 2017-04-26 ENCOUNTER — Encounter (HOSPITAL_COMMUNITY): Payer: Self-pay | Admitting: Emergency Medicine

## 2017-04-26 ENCOUNTER — Inpatient Hospital Stay (HOSPITAL_COMMUNITY)
Admission: EM | Admit: 2017-04-26 | Discharge: 2017-04-28 | DRG: 897 | Disposition: A | Discharge status: Home / Self Care | Payer: BLUE CROSS/BLUE SHIELD | Attending: Internal Medicine | Admitting: Internal Medicine

## 2017-04-26 DIAGNOSIS — Z9119 Patient's noncompliance with other medical treatment and regimen: Secondary | ICD-10-CM

## 2017-04-26 DIAGNOSIS — F10239 Alcohol dependence with withdrawal, unspecified: Secondary | ICD-10-CM | POA: Diagnosis not present

## 2017-04-26 DIAGNOSIS — Z720 Tobacco use: Secondary | ICD-10-CM

## 2017-04-26 DIAGNOSIS — Z811 Family history of alcohol abuse and dependence: Secondary | ICD-10-CM | POA: Diagnosis not present

## 2017-04-26 DIAGNOSIS — F17210 Nicotine dependence, cigarettes, uncomplicated: Secondary | ICD-10-CM | POA: Diagnosis present

## 2017-04-26 DIAGNOSIS — F10230 Alcohol dependence with withdrawal, uncomplicated: Secondary | ICD-10-CM

## 2017-04-26 DIAGNOSIS — F10939 Alcohol use, unspecified with withdrawal, unspecified: Secondary | ICD-10-CM | POA: Diagnosis present

## 2017-04-26 DIAGNOSIS — F32A Depression, unspecified: Secondary | ICD-10-CM | POA: Diagnosis present

## 2017-04-26 DIAGNOSIS — R45851 Suicidal ideations: Secondary | ICD-10-CM | POA: Diagnosis present

## 2017-04-26 DIAGNOSIS — F10930 Alcohol use, unspecified with withdrawal, uncomplicated: Secondary | ICD-10-CM

## 2017-04-26 DIAGNOSIS — F3181 Bipolar II disorder: Secondary | ICD-10-CM | POA: Diagnosis present

## 2017-04-26 DIAGNOSIS — F329 Major depressive disorder, single episode, unspecified: Secondary | ICD-10-CM | POA: Diagnosis not present

## 2017-04-26 DIAGNOSIS — F419 Anxiety disorder, unspecified: Secondary | ICD-10-CM | POA: Diagnosis present

## 2017-04-26 DIAGNOSIS — F1010 Alcohol abuse, uncomplicated: Secondary | ICD-10-CM | POA: Diagnosis not present

## 2017-04-26 LAB — COMPREHENSIVE METABOLIC PANEL
ALT: 24 U/L (ref 17–63)
ANION GAP: 10 (ref 5–15)
AST: 33 U/L (ref 15–41)
Albumin: 4.4 g/dL (ref 3.5–5.0)
Alkaline Phosphatase: 46 U/L (ref 38–126)
BUN: 8 mg/dL (ref 6–20)
CHLORIDE: 105 mmol/L (ref 101–111)
CO2: 28 mmol/L (ref 22–32)
CREATININE: 0.76 mg/dL (ref 0.61–1.24)
Calcium: 9.4 mg/dL (ref 8.9–10.3)
Glucose, Bld: 91 mg/dL (ref 65–99)
POTASSIUM: 3.6 mmol/L (ref 3.5–5.1)
SODIUM: 143 mmol/L (ref 135–145)
Total Bilirubin: 0.5 mg/dL (ref 0.3–1.2)
Total Protein: 7.8 g/dL (ref 6.5–8.1)

## 2017-04-26 LAB — CBC WITH DIFFERENTIAL/PLATELET
Basophils Absolute: 0 10*3/uL (ref 0.0–0.1)
Basophils Relative: 0 %
EOS ABS: 0 10*3/uL (ref 0.0–0.7)
EOS PCT: 0 %
HCT: 40.8 % (ref 39.0–52.0)
Hemoglobin: 14.2 g/dL (ref 13.0–17.0)
LYMPHS ABS: 2.5 10*3/uL (ref 0.7–4.0)
LYMPHS PCT: 24 %
MCH: 27.5 pg (ref 26.0–34.0)
MCHC: 34.8 g/dL (ref 30.0–36.0)
MCV: 79.1 fL (ref 78.0–100.0)
MONO ABS: 0.6 10*3/uL (ref 0.1–1.0)
Monocytes Relative: 6 %
Neutro Abs: 7 10*3/uL (ref 1.7–7.7)
Neutrophils Relative %: 70 %
PLATELETS: 222 10*3/uL (ref 150–400)
RBC: 5.16 MIL/uL (ref 4.22–5.81)
RDW: 14.7 % (ref 11.5–15.5)
WBC: 10.1 10*3/uL (ref 4.0–10.5)

## 2017-04-26 LAB — ETHANOL: ALCOHOL ETHYL (B): 105 mg/dL — AB (ref <5)

## 2017-04-26 MED ORDER — THIAMINE HCL 100 MG/ML IJ SOLN
Freq: Once | INTRAVENOUS | Status: AC
  Administered 2017-04-26: 20:00:00 via INTRAVENOUS
  Filled 2017-04-26: qty 1000

## 2017-04-26 MED ORDER — ACETAMINOPHEN 325 MG PO TABS
650.0000 mg | ORAL_TABLET | Freq: Four times a day (QID) | ORAL | Status: DC | PRN
  Administered 2017-04-26 – 2017-04-27 (×2): 650 mg via ORAL
  Filled 2017-04-26 (×2): qty 2

## 2017-04-26 MED ORDER — LORAZEPAM 1 MG PO TABS: 0.0000 mg | ORAL_TABLET | Freq: Four times a day (QID) | ORAL | Status: DC

## 2017-04-26 MED ORDER — LORAZEPAM 2 MG/ML IJ SOLN: 0.0000 mg | Freq: Two times a day (BID) | INTRAMUSCULAR | Status: DC

## 2017-04-26 MED ORDER — ADULT MULTIVITAMIN W/MINERALS CH
1.0000 | ORAL_TABLET | Freq: Every day | ORAL | Status: DC
  Administered 2017-04-27 – 2017-04-28 (×2): 1 via ORAL
  Filled 2017-04-26 (×2): qty 1

## 2017-04-26 MED ORDER — VITAMIN B-1 100 MG PO TABS
100.0000 mg | ORAL_TABLET | Freq: Every day | ORAL | Status: DC
  Administered 2017-04-27 – 2017-04-28 (×2): 100 mg via ORAL
  Filled 2017-04-26 (×2): qty 1

## 2017-04-26 MED ORDER — ONDANSETRON HCL 4 MG PO TABS: 4.0000 mg | ORAL_TABLET | Freq: Four times a day (QID) | ORAL | Status: DC | PRN

## 2017-04-26 MED ORDER — LORAZEPAM 2 MG/ML IJ SOLN
1.0000 mg | Freq: Four times a day (QID) | INTRAMUSCULAR | Status: DC | PRN
  Administered 2017-04-26 – 2017-04-27 (×3): 1 mg via INTRAVENOUS
  Filled 2017-04-26 (×3): qty 1

## 2017-04-26 MED ORDER — LORAZEPAM 2 MG/ML IJ SOLN
2.0000 mg | Freq: Once | INTRAMUSCULAR | Status: AC
  Administered 2017-04-26: 2 mg via INTRAVENOUS
  Filled 2017-04-26: qty 1

## 2017-04-26 MED ORDER — FOLIC ACID 1 MG PO TABS
1.0000 mg | ORAL_TABLET | Freq: Every day | ORAL | Status: DC
  Administered 2017-04-27 – 2017-04-28 (×2): 1 mg via ORAL
  Filled 2017-04-26 (×2): qty 1

## 2017-04-26 MED ORDER — LORAZEPAM 2 MG/ML IJ SOLN
0.0000 mg | Freq: Four times a day (QID) | INTRAMUSCULAR | Status: DC
  Administered 2017-04-26: 2 mg via INTRAVENOUS
  Filled 2017-04-26: qty 1

## 2017-04-26 MED ORDER — ONDANSETRON HCL 4 MG/2ML IJ SOLN: 4.0000 mg | Freq: Four times a day (QID) | INTRAMUSCULAR | Status: DC | PRN

## 2017-04-26 MED ORDER — LORAZEPAM 1 MG PO TABS: 1.0000 mg | ORAL_TABLET | Freq: Four times a day (QID) | ORAL | Status: DC | PRN

## 2017-04-26 MED ORDER — ENOXAPARIN SODIUM 40 MG/0.4ML ~~LOC~~ SOLN
40.0000 mg | SUBCUTANEOUS | Status: DC
  Administered 2017-04-26 – 2017-04-27 (×2): 40 mg via SUBCUTANEOUS
  Filled 2017-04-26 (×2): qty 0.4

## 2017-04-26 MED ORDER — ACETAMINOPHEN 650 MG RE SUPP: 650.0000 mg | Freq: Four times a day (QID) | RECTAL | Status: DC | PRN

## 2017-04-26 MED ORDER — THIAMINE HCL 100 MG/ML IJ SOLN
100.0000 mg | Freq: Every day | INTRAMUSCULAR | Status: DC
  Administered 2017-04-26: 100 mg via INTRAVENOUS
  Filled 2017-04-26: qty 2

## 2017-04-26 MED ORDER — LORAZEPAM 1 MG PO TABS: 0.0000 mg | ORAL_TABLET | Freq: Two times a day (BID) | ORAL | Status: DC

## 2017-04-26 MED ORDER — THIAMINE HCL 100 MG/ML IJ SOLN: 100.0000 mg | Freq: Every day | INTRAMUSCULAR | Status: DC

## 2017-04-26 MED ORDER — VITAMIN B-1 100 MG PO TABS: 100.0000 mg | ORAL_TABLET | Freq: Every day | ORAL | Status: DC

## 2017-04-26 MED ORDER — SODIUM CHLORIDE 0.9 % IV BOLUS (SEPSIS)
1000.0000 mL | Freq: Once | INTRAVENOUS | Status: AC
  Administered 2017-04-26: 1000 mL via INTRAVENOUS

## 2017-04-26 NOTE — ED Notes (Signed)
Pt with minimal improvement with 2mg  ativan.  Additional meds given.

## 2017-04-26 NOTE — H&P (Signed)
History and Physical    [name] ZOX:[id number] DOB: [birth year] DOA: 04/26/2017  PCP: Patient, No Pcp Per   Patient coming from: Home  I have personally briefly reviewed patient's old medical records in [facility]  Chief Complaint: Alcohol abuse and tremulousness  HPI: [name] is a 46 y.o. male with medical history significant of alcohol abuse, alcohol withdrawal including history of withdrawal seizures, noncompliance, tobacco abuse presented with concern for alcohol withdrawal. Patient states that he's been drinking approximately 50 beers per day for the last 2 weeks. His last drink was 6 AM this morning. He became very anxious and shaky prior to presentation to the ED. No seizure-like activities reported. He also complains of nausea and vomiting. He has had multiple admissions for alcoholic withdrawal in the past in [place]. He states that he's been under a lot of stress recently. No suicidal or homicidal ideation. Patient denies any fall, loss of consciousness, chest pain, shortness of breath, diarrhea or dysuria.   ED Course: Patient was found to be tachycardic and tremulous. He was given Ativan. CIWA was calculated to be 20. Hospitalist service was called to admit the patient  Review of Systems: Patient is slightly drowsy so does not answer questions properly. Review of systems could not be appropriately obtained.    Past Medical History:  Diagnosis Date  . Active smoker   . Alcohol abuse   . Anxiety   . Bipolar 2 disorder (HCC)   . Depression     Past Surgical History:  Procedure Laterality Date  . LYMPH NODE DISSECTION     Social history  reports that he has been smoking Cigarettes.  He has a 40.00 pack-year smoking history. He has never used smokeless tobacco. He reports that he drinks about 151.2 oz of alcohol per week . He reports that he does not use drugs.  Lives at home with girlfriend.  Allergies  Allergen Reactions  . Paxil  [Paroxetine Hcl] Hives    Family History  Problem Relation Age of Onset  . Alcohol abuse Father     Prior to Admission medications   Medication Sig Start Date End Date Taking? Authorizing Provider  gabapentin (NEURONTIN) 100 MG capsule Take 100 mg by mouth 3 (three) times daily.   Yes [provider]  tadalafil (CIALIS) 5 MG tablet Take 5 mg by mouth every 3 (three) days.   Yes [provider]  chlordiazePOXIDE (LIBRIUM) 25 MG capsule 50mg  PO QID x 1D, then 25mg  PO QID X 1D, then 25mg  PO BID X 1D, then 25mg  PO once x 1D Patient not taking: Reported on 04/26/2017 11/25/16   Lennette Bihari, MD  chlordiazePOXIDE (LIBRIUM) 25 MG capsule 50mg  PO TID x 1D, then 25-50mg  PO BID X 1D, then 25-50mg  PO QD X 1D Patient not taking: Reported on 04/26/2017 03/02/17   Melene Plan, DO    Physical Exam: Vitals:   04/26/17 1303 04/26/17 1350  BP: (!) 143/114   Pulse: 97 91  Resp: 20   Temp: 98.4 F (36.9 C)   TempSrc: Oral   SpO2: 98%     Constitutional: Patient looks disheveled, older than stated age. He is drowsy but wakes up Vitals:   04/26/17 1303 04/26/17 1350  BP: (!) 143/114   Pulse: 97 91  Resp: 20   Temp: 98.4 F (36.9 C)   TempSrc: Oral   SpO2: 98%    Eyes: PERRL, lids and conjunctivae normal ENMT: Mucous membranes are Dry. Posterior  pharynx clear of any exudate or lesions Neck: normal, supple, no masses, no thyromegaly Respiratory: Bilateral decreased breath sounds at bases; no wheezing or crackles. Cardiovascular: S1-S2 heard, rate controlled. No murmurs  Abdomen: no tenderness, no masses palpated. No hepatosplenomegaly. Bowel sounds positive.  Musculoskeletal: no clubbing / cyanosis. No joint deformity upper and lower extremities.  Skin: no rashes, lesions, ulcers. No induration Neurologic: Drowsy but wakes up and answers questions and is oriented; CN 2-12 grossly intact. No obvious focal neurologic deficit. Tremulous Psychiatric: Cannot be assessed  because of drowsiness  Labs on Admission: I have personally reviewed following labs and imaging studies  CBC:  Recent Labs Lab 04/26/17 1350  WBC 10.1  NEUTROABS 7.0  HGB 14.2  HCT 40.8  MCV 79.1  PLT 222   Basic Metabolic Panel:  Recent Labs Lab 04/26/17 1350  NA 143  K 3.6  CL 105  CO2 28  GLUCOSE 91  BUN 8  CREATININE 0.76  CALCIUM 9.4   GFR: CrCl cannot be calculated (Unknown ideal weight.). Liver Function Tests:  Recent Labs Lab 04/26/17 1350  AST 33  ALT 24  ALKPHOS 46  BILITOT 0.5  PROT 7.8  ALBUMIN 4.4   No results for input(s): LIPASE, AMYLASE in the last 168 hours. No results for input(s): AMMONIA in the last 168 hours. Coagulation Profile: No results for input(s): INR, PROTIME in the last 168 hours. Cardiac Enzymes: No results for input(s): CKTOTAL, CKMB, CKMBINDEX, TROPONINI in the last 168 hours. BNP (last 3 results) No results for input(s): PROBNP in the last 8760 hours. HbA1C: No results for input(s): HGBA1C in the last 72 hours. CBG: No results for input(s): GLUCAP in the last 168 hours. Lipid Profile: No results for input(s): CHOL, HDL, LDLCALC, TRIG, CHOLHDL, LDLDIRECT in the last 72 hours. Thyroid Function Tests: No results for input(s): TSH, T4TOTAL, FREET4, T3FREE, THYROIDAB in the last 72 hours. Anemia Panel: No results for input(s): VITAMINB12, FOLATE, FERRITIN, TIBC, IRON, RETICCTPCT in the last 72 hours. Urine analysis: No results found for: COLORURINE, APPEARANCEUR, LABSPEC, PHURINE, GLUCOSEU, HGBUR, BILIRUBINUR, KETONESUR, PROTEINUR, UROBILINOGEN, NITRITE, LEUKOCYTESUR  Radiological Exams on Admission: No results found.  EKG: Independently reviewed. Normal sinus rhythm at 99 bpm no ST elevations or depressions.  Assessment/Plan Principal Problem:   Alcohol withdrawal (HCC) Active Problems:   Alcohol abuse   Depression    Alcohol withdrawal in a patient with history of alcohol abuse and alcohol withdrawal seizures  in past - Admit to telemetry -Initiate CIWA protocol; monitor mental status. Seizure precautions. Fall precautions - Multivitamin, thiamine, folate - Counseled the patient about alcohol abstinence -Child psychotherapist consult for providing resources regarding help for alcohol abuse -Check labs in a.m. including magnesium and phosphorus. Watch out for delirium tremens and refeeding syndrome  Tobacco abuse - Counseled about tobacco cessation  Anxiety and depression -Patient would benefit from outpatient evaluation by a psychiatrist.   DVT prophylaxis: Lovenox  Code Status: Full  Family Communication: None at bedside  Disposition Plan: Home in 2-3 days  Consults called: None  Admission status: Inpatient in telemetry  Severity of Illness: The appropriate patient status for this patient is INPATIENT. Inpatient status is judged to be reasonable and necessary in order to provide the required intensity of service to ensure the patient's safety. The patient's presenting symptoms, physical exam findings, and initial radiographic and laboratory data in the context of their chronic comorbidities is felt to place them at high risk for further clinical deterioration. Furthermore, it is not anticipated  that the patient will be medically stable for discharge from the hospital within 2 midnights of admission. The following factors support the patient status of inpatient.   " The patient's presenting symptoms include alcohol abuse, symptoms of withdrawal " The worrisome physical exam findings include hypertension, tachycardia, tremulousness  " The chronic co-morbidities include alcohol abuse and history of alcohol withdrawal seizures * I certify that at the point of admission it is my clinical judgment that the patient will require inpatient hospital care spanning beyond 2 midnights from the point of admission due to high intensity of service, high risk for further deterioration and high frequency of surveillance  required.Glade Lloyd*    Takako Minckler MD [facility] Pager [phone]  If 7PM-7AM, please contact night-coverage [email or web address] Password [email or web address]  04/26/2017, 4:48 PM

## 2017-04-26 NOTE — ED Triage Notes (Signed)
Pt reports he is etoh withdrawals. Hx of seizure from DTs. Pt shaking in triage. Last drink this am.

## 2017-04-26 NOTE — ED Provider Notes (Signed)
[facility]-EMERGENCY DEPT Provider Note   CSN: [id number] Arrival date & time: 04/26/17  1218     History   Chief Complaint Chief Complaint  Patient presents with  . alcohol detox    HPI [name] is a 46 y.o. male.  HPI   46 year old male with a history of alcohol abuse and withdrawal including history of withdrawal seizures presents with concern for alcohol withdrawal. Patient reports that he had not been drinking, however the last 2 weeks, he was drinking approximately 50 beers per day. His last drink was this morning at 6 AM, just prior to arrival he developed severe anxiety, shaking. Reports nausea and vomiting beginning this morning. Reports burning pain in his throat from vomiting. Reports his anxiety is shaking is more severe and has been in the past. He's had multiple admissions for alcohol withdrawal in the [place].  Has been under a lot of stress recently.  Lost a loved one. No SI/HI.   Past Medical History:  Diagnosis Date  . Active smoker   . Alcohol abuse   . Anxiety   . Bipolar 2 disorder (HCC)   . Depression     Patient Active Problem List   Diagnosis Date Noted  . Delirium tremens (HCC) 03/02/2017  . GI bleed 10/06/2013  . Marijuana abuse 10/06/2013  . Alcohol withdrawal (HCC) 10/05/2013  . Suicidal ideation 10/05/2013  . Alcohol abuse 10/26/2011  . Alcohol withdrawal syndrome (HCC) 10/26/2011  . Depression 10/26/2011    Past Surgical History:  Procedure Laterality Date  . LYMPH NODE DISSECTION         Home Medications    Prior to Admission medications   Medication Sig Start Date End Date Taking? Authorizing Provider  gabapentin (NEURONTIN) 100 MG capsule Take 100 mg by mouth 3 (three) times daily.   Yes [provider]  tadalafil (CIALIS) 5 MG tablet Take 5 mg by mouth every 3 (three) days.   Yes [provider]  chlordiazePOXIDE (LIBRIUM) 25 MG capsule 50mg  PO QID x 1D, then 25mg  PO QID X 1D, then 25mg  PO  BID X 1D, then 25mg  PO once x 1D Patient not taking: Reported on 04/26/2017 11/25/16   Lennette BihariButler, John Louis, MD  chlordiazePOXIDE (LIBRIUM) 25 MG capsule 50mg  PO TID x 1D, then 25-50mg  PO BID X 1D, then 25-50mg  PO QD X 1D Patient not taking: Reported on 04/26/2017 03/02/17   Melene PlanFloyd, Dan, DO    Family History Family History  Problem Relation Age of Onset  . Alcohol abuse Father     Social History Social History  Substance Use Topics  . Smoking status: Current Every Day Smoker    Packs/day: 2.00    Years: 20.00    Types: Cigarettes  . Smokeless tobacco: Never Used  . Alcohol use 151.2 oz/week    252 Cans of beer per week     Comment: heavy      Allergies   Paxil [paroxetine hcl]   Review of Systems Review of Systems  Constitutional: Negative for fever.  HENT: Positive for sore throat.   Eyes: Negative for visual disturbance.  Respiratory: Negative for shortness of breath.   Cardiovascular: Negative for chest pain.  Gastrointestinal: Positive for nausea and vomiting. Negative for abdominal pain.  Genitourinary: Negative for difficulty urinating and dysuria.  Musculoskeletal: Negative for back pain and neck stiffness.  Skin: Negative for rash.  Neurological: Positive for tremors. Negative for syncope and headaches.  Psychiatric/Behavioral: The patient is nervous/anxious.  Physical Exam Updated Vital Signs BP 139/76 (BP Location: Right Arm)   Pulse (!) 113   Temp 98.8 F (37.1 C) (Oral)   Resp 19   SpO2 95%   Physical Exam  Constitutional: He is oriented to person, place, and time. He appears well-developed and well-nourished. No distress.  HENT:  Head: Normocephalic and atraumatic.  Tongue with tremor  Eyes: Conjunctivae and EOM are normal.  Neck: Normal range of motion.  Cardiovascular: Regular rhythm, normal heart sounds and intact distal pulses.  Tachycardia present.  Exam reveals no gallop and no friction rub.   No murmur heard. Pulmonary/Chest: Effort  normal and breath sounds normal. No respiratory distress. He has no wheezes. He has no rales.  Abdominal: Soft. He exhibits no distension. There is no tenderness. There is no guarding.  Musculoskeletal: He exhibits no edema.  Neurological: He is alert and oriented to person, place, and time. He displays tremor.  Severe tremor even with arms not extended  Skin: Skin is warm and dry. He is not diaphoretic.  Psychiatric: His mood appears anxious. He is agitated.  Nursing note and vitals reviewed.    ED Treatments / Results  Labs (all labs ordered are listed, but only abnormal results are displayed) Labs Reviewed  ETHANOL - Abnormal; Notable for the following:       Result Value   Alcohol, Ethyl (B) 105 (*)    All other components within normal limits  CBC WITH DIFFERENTIAL/PLATELET  COMPREHENSIVE METABOLIC PANEL  HIV ANTIBODY (ROUTINE TESTING)  COMPREHENSIVE METABOLIC PANEL  CBC  MAGNESIUM  PHOSPHORUS    EKG  EKG Interpretation  Date/Time:  Sunday April 26 2017 13:25:24 EDT Ventricular Rate:  99 PR Interval:    QRS Duration: 88 QT Interval:  329 QTC Calculation: 423 R Axis:   -14 Text Interpretation:  Normal sinus rhythm No significant change since last tracing Confirmed by Alvira Monday ([id number]) on 04/26/2017 1:57:05 PM       Radiology No results found.  Procedures Procedures (including critical care time)  Medications Ordered in ED Medications  LORazepam (ATIVAN) tablet 1 mg ( Oral See Alternative 04/26/17 2017)    Or  LORazepam (ATIVAN) injection 1 mg (1 mg Intravenous Given 04/26/17 2017)  thiamine (VITAMIN B-1) tablet 100 mg (not administered)    Or  thiamine (B-1) injection 100 mg (not administered)  folic acid (FOLVITE) tablet 1 mg (not administered)  multivitamin with minerals tablet 1 tablet (not administered)  enoxaparin (LOVENOX) injection 40 mg (40 mg Subcutaneous Given 04/26/17 1957)  acetaminophen (TYLENOL) tablet 650 mg (650 mg Oral Given 04/26/17  2017)    Or  acetaminophen (TYLENOL) suppository 650 mg ( Rectal See Alternative 04/26/17 2017)  ondansetron (ZOFRAN) tablet 4 mg (not administered)    Or  ondansetron (ZOFRAN) injection 4 mg (not administered)  sodium chloride 0.9 % bolus 1,000 mL (0 mLs Intravenous Stopped 04/26/17 1610)  LORazepam (ATIVAN) injection 2 mg (2 mg Intravenous Given 04/26/17 1428)  LORazepam (ATIVAN) injection 2 mg (2 mg Intravenous Given 04/26/17 1610)  sodium chloride 0.9 % 1,000 mL with thiamine 100 mg, folic acid 1 mg, multivitamins adult 10 mL infusion ( Intravenous New Bag/Given 04/26/17 1957)  CRITICAL CARE: etoh withdrawal, reevaluation, readministration of medications Performed by: Rhae Lerner   Total critical care time: 30 minutes  Critical care time was exclusive of separately billable procedures and treating other patients.  Critical care was necessary to treat or prevent imminent or life-threatening deterioration.  Critical care was  time spent personally by me on the following activities: development of treatment plan with patient and/or surrogate as well as nursing, discussions with consultants, evaluation of patient's response to treatment, examination of patient, obtaining history from patient or surrogate, ordering and performing treatments and interventions, ordering and review of laboratory studies, ordering and review of radiographic studies, pulse oximetry and re-evaluation of patient's condition.    Initial Impression / Assessment and Plan / ED Course  I have reviewed the triage vital signs and the nursing notes.  Pertinent labs & imaging results that were available during my care of the patient were reviewed by me and considered in my medical decision making (see chart for details).     46 year old male with a history of alcohol abuse and withdrawal including history of withdrawal seizures presents with concern for alcohol withdrawal. CIWA score initiated.  I am  calculating CIWA of 20. Ativan given, and given for continuing symptoms. Labs without acute changes. Given elevation in CIWA greater than 15, past hx of etoh withdrawal seizures, will admit for medical management of withdrawal. Patient with return of tachycardia and tremulous, given additional medication. Admitted in stable condition.  Final Clinical Impressions(s) / ED Diagnoses   Final diagnoses:  Alcohol withdrawal syndrome without complication [facility])    New Prescriptions Current Discharge Medication List       Alvira Monday, MD 04/26/17 2325

## 2017-04-27 LAB — CBC
HEMATOCRIT: 41.2 % (ref 39.0–52.0)
Hemoglobin: 13.8 g/dL (ref 13.0–17.0)
MCH: 26.7 pg (ref 26.0–34.0)
MCHC: 33.5 g/dL (ref 30.0–36.0)
MCV: 79.8 fL (ref 78.0–100.0)
PLATELETS: 213 10*3/uL (ref 150–400)
RBC: 5.16 MIL/uL (ref 4.22–5.81)
RDW: 14.9 % (ref 11.5–15.5)
WBC: 7.6 10*3/uL (ref 4.0–10.5)

## 2017-04-27 LAB — COMPREHENSIVE METABOLIC PANEL
ALT: 23 U/L (ref 17–63)
AST: 28 U/L (ref 15–41)
Albumin: 3.5 g/dL (ref 3.5–5.0)
Alkaline Phosphatase: 39 U/L (ref 38–126)
Anion gap: 9 (ref 5–15)
BUN: 10 mg/dL (ref 6–20)
CALCIUM: 8.7 mg/dL — AB (ref 8.9–10.3)
CO2: 27 mmol/L (ref 22–32)
CREATININE: 0.75 mg/dL (ref 0.61–1.24)
Chloride: 104 mmol/L (ref 101–111)
GFR calc Af Amer: >60 mL/min (ref >60)
GLUCOSE: 85 mg/dL (ref 65–99)
Potassium: 3.6 mmol/L (ref 3.5–5.1)
SODIUM: 140 mmol/L (ref 135–145)
TOTAL PROTEIN: 6.4 g/dL — AB (ref 6.5–8.1)
Total Bilirubin: 1.1 mg/dL (ref 0.3–1.2)

## 2017-04-27 LAB — MAGNESIUM: Magnesium: 1.9 mg/dL (ref 1.7–2.4)

## 2017-04-27 LAB — PHOSPHORUS: PHOSPHORUS: 3.9 mg/dL (ref 2.5–4.6)

## 2017-04-27 MED ORDER — NICOTINE 21 MG/24HR TD PT24
21.0000 mg | MEDICATED_PATCH | Freq: Every day | TRANSDERMAL | Status: DC
  Administered 2017-04-27: 21 mg via TRANSDERMAL
  Filled 2017-04-27: qty 1

## 2017-04-27 MED ORDER — LORAZEPAM 2 MG/ML IJ SOLN
1.0000 mg | INTRAMUSCULAR | Status: DC | PRN
  Administered 2017-04-27: 1 mg via INTRAVENOUS
  Filled 2017-04-27: qty 1

## 2017-04-27 MED ORDER — NICOTINE 21 MG/24HR TD PT24
21.0000 mg | MEDICATED_PATCH | Freq: Every day | TRANSDERMAL | Status: DC
  Administered 2017-04-27 – 2017-04-28 (×2): 21 mg via TRANSDERMAL
  Filled 2017-04-27 (×2): qty 1

## 2017-04-27 MED ORDER — LORAZEPAM 1 MG PO TABS
2.0000 mg | ORAL_TABLET | Freq: Four times a day (QID) | ORAL | Status: DC | PRN
  Administered 2017-04-27 – 2017-04-28 (×5): 2 mg via ORAL
  Filled 2017-04-27 (×5): qty 2

## 2017-04-27 NOTE — Progress Notes (Signed)
Patient ID: [name], male   DOB: 26-Dec-1970, 46 y.o.   MRN: [id number]  PROGRESS NOTE    [name]  WUJ:[id number] DOB: 02/04/1971 DOA: 04/26/2017 PCP: Patient, No Pcp Per   Brief Narrative:  46 y.o. male with medical history significant of alcohol abuse, alcohol withdrawal including history of withdrawal seizures, noncompliance, tobacco abuse presented with concern for alcohol withdrawal. He has been placed on CIWA protocol.   Assessment & Plan:   Principal Problem:   Alcohol withdrawal (HCC) Active Problems:   Alcohol abuse   Depression   Alcohol withdrawal in a patient with history of alcohol abuse and alcohol withdrawal seizures in past - Continue CIWA protocol; monitor mental status. Seizure precautions. Fall precautions - Multivitamin, thiamine, folate - Counseled the patient about alcohol abstinence -Child psychotherapist consult for providing resources regarding help for alcohol abuse -Repeat labs in a.m. - Was out for delirium tremens  Tobacco abuse - Counseled about tobacco cessation  Anxiety and depression -Patient would benefit from outpatient evaluation by a psychiatrist.   DVT prophylaxis: Lovenox  Code Status: Full  Family Communication:  discussed with patient and girlfriend present at bedside Disposition Plan: Home in 1-2 days  Consultants: None  Procedures: None  Antimicrobials: None    Subjective: Patient seen and examined at bedside. He feels slightly better but still feels shaky. No overnight fever, nausea or vomiting.  Objective: Vitals:   04/26/17 1700 04/26/17 1847 04/26/17 2329 04/27/17 0517  BP: 109/70 139/76 121/75 132/84  Pulse: 91 (!) 113 83 87  Resp: 20 19 20 18   Temp:  98.8 F (37.1 C) 98.2 F (36.8 C) 98.3 F (36.8 C)  TempSrc:  Oral Oral Oral  SpO2: 94% 95% 98% 97%    Intake/Output Summary (Last 24 hours) at 04/27/17 1023 Last data filed at 04/27/17 0518  Gross per 24 hour  Intake             1000 ml    Output              650 ml  Net              350 ml   There were no vitals filed for this visit.  Examination:  General exam: Appears Slightly anxious but comfortable  Respiratory system: Bilateral decreased breath sound at bases Cardiovascular system: S1 & S2 heard, Rate controlled Gastrointestinal system: Abdomen is nondistended, soft and nontender. Normal bowel sounds heard. Extremities: No cyanosis, clubbing, edema    Data Reviewed: I have personally reviewed following labs and imaging studies  CBC:  Recent Labs Lab 04/26/17 1350 04/27/17 0524  WBC 10.1 7.6  NEUTROABS 7.0  --   HGB 14.2 13.8  HCT 40.8 41.2  MCV 79.1 79.8  PLT 222 213   Basic Metabolic Panel:  Recent Labs Lab 04/26/17 1350 04/27/17 0524  NA 143 140  K 3.6 3.6  CL 105 104  CO2 28 27  GLUCOSE 91 85  BUN 8 10  CREATININE 0.76 0.75  CALCIUM 9.4 8.7*  MG  --  1.9  PHOS  --  3.9   GFR: CrCl cannot be calculated (Unknown ideal weight.). Liver Function Tests:  Recent Labs Lab 04/26/17 1350 04/27/17 0524  AST 33 28  ALT 24 23  ALKPHOS 46 39  BILITOT 0.5 1.1  PROT 7.8 6.4*  ALBUMIN 4.4 3.5   No results for input(s): LIPASE, AMYLASE in the last 168 hours. No results for input(s): AMMONIA in the last 168  hours. Coagulation Profile: No results for input(s): INR, PROTIME in the last 168 hours. Cardiac Enzymes: No results for input(s): CKTOTAL, CKMB, CKMBINDEX, TROPONINI in the last 168 hours. BNP (last 3 results) No results for input(s): PROBNP in the last 8760 hours. HbA1C: No results for input(s): HGBA1C in the last 72 hours. CBG: No results for input(s): GLUCAP in the last 168 hours. Lipid Profile: No results for input(s): CHOL, HDL, LDLCALC, TRIG, CHOLHDL, LDLDIRECT in the last 72 hours. Thyroid Function Tests: No results for input(s): TSH, T4TOTAL, FREET4, T3FREE, THYROIDAB in the last 72 hours. Anemia Panel: No results for input(s): VITAMINB12, FOLATE, FERRITIN, TIBC, IRON,  RETICCTPCT in the last 72 hours. Sepsis Labs: No results for input(s): PROCALCITON, LATICACIDVEN in the last 168 hours.  No results found for this or any previous visit (from the past 240 hour(s)).       Radiology Studies: No results found.      Scheduled Meds: . enoxaparin (LOVENOX) injection  40 mg Subcutaneous Q24H  . folic acid  1 mg Oral Daily  . multivitamin with minerals  1 tablet Oral Daily  . nicotine  21 mg Transdermal Daily  . thiamine  100 mg Oral Daily   Or  . thiamine  100 mg Intravenous Daily   Continuous Infusions:   LOS: 1 day        Glade LloydKshitiz Dimonique Bourdeau, MD [facility] Pager [phone]  If 7PM-7AM, please contact night-coverage [email or web address] Password [email or web address] 04/27/2017, 10:23 AM

## 2017-04-27 NOTE — Clinical Social Work Note (Signed)
Clinical Social Work Assessment  Patient Details  Name: [name] MRN: [id number] Date of Birth: [birth year]  Date of referral:  04/27/17               Reason for consult:  Substance Use/ETOH Abuse                Permission sought to share information with:    Permission granted to share information::     Name::        Agency::     Relationship::     Contact Information:     Housing/Transportation Living arrangements for the past 2 months:  Single Family Home Source of Information:  Patient Patient Interpreter Needed:  None Criminal Activity/Legal Involvement Pertinent to Current Situation/Hospitalization:  No - Comment as needed Significant Relationships:  Spouse Lives with:  Minor Children, Spouse Do you feel safe going back to the place where you live?  Yes Need for family participation in patient care:  No (Coment)  Care giving concerns:     Facilities manager / plan:  CSW met with patient, spouse and daughter at bedside, explain role and reason for visit. Patient agreeable for family to stay present.  Patient alert and oriented and agreeable to talk. Patient reports, "I do not drink daily, I have a problem with Binge drinking. I binge drink when I have a lot of stress and anxitety." Patient reports recently "buried" two family members a cousin and friend. Patient reports he has been drinking everyday, nonstop. Patient reports he is currently apart of AA group and attends weekly. Patient reports he went to residential treatment it did not feel it helped as much as AA group. Patient reports he is agreeable to outpatient psychiatry program for medication management and counseling. CSW provided information about [facility] intensive outpatient and educated patient about following up.   Plan: CSW provided counseling information, pt. Plans to make own appointment.   Employment status:  [employer] information:    PT Recommendations:  Not assessed at this  time Information / Referral to community resources:  Outpatient Substance Abuse Treatment Options/Outpatient psychiatry, counseling options.  Patient/Family's Response to care:  Agreeable and responding well care.   Patient/Family's Understanding of and Emotional Response to Diagnosis, Current Treatment, and Prognosis: "I am going through withdrawals now, I am having tremors, I am not sure mediations the doctor has prescribed me." Patient is learning current treatment through medical work up.   Emotional Assessment Appearance:  Developmentally appropriate Attitude/Demeanor/Rapport:    Affect (typically observed):  Accepting, Calm Orientation:  Oriented to Self, Oriented to Place, Oriented to  Time, Oriented to Situation Alcohol / Substance use:  Alcohol Use Psych involvement (Current and /or in the community):  No (Comment)  Discharge Needs  Concerns to be addressed:  Substance Abuse Concerns Readmission within the last 30 days:  No Current discharge risk:  Substance Abuse Barriers to Discharge:  Continued Medical Work up   [employer], LCSW 04/27/2017, 10:42 AM

## 2017-04-27 NOTE — Care Management Note (Signed)
Case Management Note  Patient Details  Name: [name] MRN: [id number] Date of Birth: 1971-03-21  Subjective/Objective:                  etoh w/d  Action/Plan: Date:  April 27 2017  Chart reviewed for concurrent status and case management needs.  Will continue to follow patient progress.  Discharge Planning: following for needs  Expected discharge date: [id number]  Marcelle SmilingRhonda Jarek Longton, BSN, [place] Can[place] AbajoRN3, [place]CCM   [phone]   Expected Discharge Date:                  Expected Discharge Plan:  Home/Self Care  In-House Referral:     Discharge planning Services  CM Consult  Post Acute Care Choice:    Choice offered to:     DME Arranged:    DME Agency:     HH Arranged:    HH Agency:     Status of Service:  In process, will continue to follow  If discussed at Long Length of Stay Meetings, dates discussed:    Additional Comments:  Golda AcreDavis, [name], RN 04/27/2017, 9:19 AM

## 2017-04-28 LAB — BASIC METABOLIC PANEL
ANION GAP: 8 (ref 5–15)
BUN: 7 mg/dL (ref 6–20)
CHLORIDE: 104 mmol/L (ref 101–111)
CO2: 28 mmol/L (ref 22–32)
Calcium: 9.2 mg/dL (ref 8.9–10.3)
Creatinine, Ser: 0.78 mg/dL (ref 0.61–1.24)
GFR calc Af Amer: >60 mL/min (ref >60)
GFR calc non Af Amer: >60 mL/min (ref >60)
GLUCOSE: 93 mg/dL (ref 65–99)
POTASSIUM: 3.6 mmol/L (ref 3.5–5.1)
Sodium: 140 mmol/L (ref 135–145)

## 2017-04-28 LAB — MAGNESIUM: Magnesium: 2.1 mg/dL (ref 1.7–2.4)

## 2017-04-28 LAB — HIV ANTIBODY (ROUTINE TESTING W REFLEX): HIV Screen 4th Generation wRfx: NONREACTIVE

## 2017-04-28 MED ORDER — THIAMINE HCL 100 MG PO TABS: 100.0000 mg | ORAL_TABLET | Freq: Every day | ORAL | 0 refills | Status: DC

## 2017-04-28 MED ORDER — FOLIC ACID 1 MG PO TABS: 1.0000 mg | ORAL_TABLET | Freq: Every day | ORAL | 0 refills | Status: DC

## 2017-04-28 MED ORDER — CHLORDIAZEPOXIDE HCL 25 MG PO CAPS: ORAL_CAPSULE | ORAL | 0 refills | Status: DC

## 2017-04-28 MED ORDER — THIAMINE HCL 100 MG PO TABS
100.0000 mg | ORAL_TABLET | Freq: Every day | ORAL | 0 refills | Status: DC
Start: 2017-04-29 — End: 2017-07-08

## 2017-04-28 MED ORDER — ADULT MULTIVITAMIN W/MINERALS CH: 1.0000 | ORAL_TABLET | Freq: Every day | ORAL | 0 refills | Status: DC

## 2017-04-28 NOTE — Discharge Summary (Signed)
Physician Discharge Summary  Dennis ChurchMichael C [name] JYN:[id number] DOB: 09/22/1971 DOA: 04/26/2017  PCP: Patient, No Pcp Per  Admit date: 04/26/2017 Discharge date: 04/28/2017  Admitted From: Home Disposition:  Home  Recommendations for Outpatient Follow-up:  1. Follow up with PCP in 1week 2. Abstain from alcohol 3. Please don't take Librium and drink alcohol at the same time   Home Health: No  Equipment/Devices: None  Discharge Condition: Stable CODE STATUS: Full  Diet recommendation: Heart Healthy   Brief/Interim Summary: 46 y.o.malewith medical history significant of alcohol abuse, alcohol withdrawal including history of withdrawal seizures, noncompliance, tobacco abuse presented with concern for alcohol withdrawal. He was  placed on CIWA protocol. He has improved.    Discharge Diagnoses:  Principal Problem:   Alcohol withdrawal (HCC) Active Problems:   Alcohol abuse   Depression  Alcohol withdrawal in a patient with history of alcohol abuse and alcohol withdrawal seizures in past - Treated with CIWA protocol - Condition improving. Discharge home on tapering doses of Librium for the next few days. Patient was instructed not to take Librium and drink alcohol at the same time - Continue Multivitamin, thiamine, folate - Counseled the patient about alcohol abstinence -Child psychotherapistocial worker has seen the patient and provided resources regarding help for alcohol abuse   Tobacco abuse - Counseled about tobacco cessation  Anxiety and depression -Patient would benefit from outpatient evaluation by a psychiatrist.    Discharge Instructions  Discharge Instructions    Call MD for:  difficulty breathing, headache or visual disturbances    Complete by:  As directed    Call MD for:  extreme fatigue    Complete by:  As directed    Call MD for:  hives    Complete by:  As directed    Call MD for:  persistant dizziness or light-headedness    Complete by:  As directed    Call MD for:   persistant nausea and vomiting    Complete by:  As directed    Call MD for:  severe uncontrolled pain    Complete by:  As directed    Call MD for:  temperature >100.4    Complete by:  As directed    Diet - low sodium heart healthy    Complete by:  As directed    Discharge instructions    Complete by:  As directed    Abstain from alcohol   Increase activity slowly    Complete by:  As directed      Allergies as of 04/28/2017      Reactions   Paxil [paroxetine Hcl] Hives      Medication List    TAKE these medications   chlordiazePOXIDE 25 MG capsule Commonly known as:  LIBRIUM 25mg  TID x1d, then 25mg  BIDx1d, then 25mg  daily x1d, then stop What changed:  additional instructions  Another medication with the same name was removed. Continue taking this medication, and follow the directions you see here.   folic acid 1 MG tablet Commonly known as:  FOLVITE Take 1 tablet (1 mg total) by mouth daily.   gabapentin 100 MG capsule Commonly known as:  NEURONTIN Take 100 mg by mouth 3 (three) times daily.   multivitamin with minerals Tabs tablet Take 1 tablet by mouth daily.   tadalafil 5 MG tablet Commonly known as:  CIALIS Take 5 mg by mouth every 3 (three) days.   thiamine 100 MG tablet Take 1 tablet (100 mg total) by mouth daily.  Allergies  Allergen Reactions  . Paxil [Paroxetine Hcl] Hives    Consultations: None Procedures/Studies: None  Subjective: Patient seen and examined at bedside. He denies any overnight fever, nausea or vomiting. He feels better and wants to go home.  Discharge Exam: Vitals:   04/27/17 2107 04/28/17 0543  BP: 131/73 134/81  Pulse: 79 79  Resp: 18 18  Temp: 97.8 F (36.6 C) 97.9 F (36.6 C)   Vitals:   04/27/17 0517 04/27/17 1611 04/27/17 2107 04/28/17 0543  BP: 132/84 126/72 131/73 134/81  Pulse: 87 95 79 79  Resp: 18 18 18 18   Temp: 98.3 F (36.8 C) 98.5 F (36.9 C) 97.8 F (36.6 C) 97.9 F (36.6 C)  TempSrc:  Oral Oral Oral Oral  SpO2: 97% 99% 98% 97%    General: Pt is alert, awake, not in acute distress; slightly anxious Cardiovascular: RRR, S1/S2 + Respiratory: Bilateral decreased breath sounds at bases Abdominal: Soft, NT, ND, bowel sounds + Extremities: no edema, no cyanosis    The results of significant diagnostics from this hospitalization (including imaging, microbiology, ancillary and laboratory) are listed below for reference.     Microbiology: No results found for this or any previous visit (from the past 240 hour(s)).   Labs: BNP (last 3 results) No results for input(s): BNP in the last 8760 hours. Basic Metabolic Panel:  Recent Labs Lab 04/26/17 1350 04/27/17 0524 04/28/17 0534  NA 143 140 140  K 3.6 3.6 3.6  CL 105 104 104  CO2 28 27 28   GLUCOSE 91 85 93  BUN 8 10 7   CREATININE 0.76 0.75 0.78  CALCIUM 9.4 8.7* 9.2  MG  --  1.9 2.1  PHOS  --  3.9  --    Liver Function Tests:  Recent Labs Lab 04/26/17 1350 04/27/17 0524  AST 33 28  ALT 24 23  ALKPHOS 46 39  BILITOT 0.5 1.1  PROT 7.8 6.4*  ALBUMIN 4.4 3.5   No results for input(s): LIPASE, AMYLASE in the last 168 hours. No results for input(s): AMMONIA in the last 168 hours. CBC:  Recent Labs Lab 04/26/17 1350 04/27/17 0524  WBC 10.1 7.6  NEUTROABS 7.0  --   HGB 14.2 13.8  HCT 40.8 41.2  MCV 79.1 79.8  PLT 222 213   Cardiac Enzymes: No results for input(s): CKTOTAL, CKMB, CKMBINDEX, TROPONINI in the last 168 hours. BNP: Invalid input(s): POCBNP CBG: No results for input(s): GLUCAP in the last 168 hours. D-Dimer No results for input(s): DDIMER in the last 72 hours. Hgb A1c No results for input(s): HGBA1C in the last 72 hours. Lipid Profile No results for input(s): CHOL, HDL, LDLCALC, TRIG, CHOLHDL, LDLDIRECT in the last 72 hours. Thyroid function studies No results for input(s): TSH, T4TOTAL, T3FREE, THYROIDAB in the last 72 hours.  Invalid input(s): FREET3 Anemia work up No  results for input(s): VITAMINB12, FOLATE, FERRITIN, TIBC, IRON, RETICCTPCT in the last 72 hours. Urinalysis No results found for: COLORURINE, APPEARANCEUR, LABSPEC, PHURINE, GLUCOSEU, HGBUR, BILIRUBINUR, KETONESUR, PROTEINUR, UROBILINOGEN, NITRITE, LEUKOCYTESUR Sepsis Labs Invalid input(s): PROCALCITONIN,  WBC,  LACTICIDVEN Microbiology No results found for this or any previous visit (from the past 240 hour(s)).   Time coordinating discharge: 35 minutes  SIGNED:   Glade Lloyd, MD  [facility] 04/28/2017, 11:21 AM Pager: [phone]  If 7PM-7AM, please contact night-coverage [email or web address] Password [email or web address]

## 2017-04-28 NOTE — Progress Notes (Signed)
Patient dc'd. IV removed, DC paperwork reviewed, and paper prescriptions given. Patient refused wheelchair escort to ride. Patient ambulated with significant other at discharge.

## 2017-05-27 ENCOUNTER — Encounter (HOSPITAL_COMMUNITY): Payer: Self-pay | Admitting: Emergency Medicine

## 2017-05-27 ENCOUNTER — Inpatient Hospital Stay (HOSPITAL_COMMUNITY)
Admission: EM | Admit: 2017-05-27 | Discharge: 2017-05-28 | DRG: 894 | Discharge status: Against Medical Advice | Payer: BLUE CROSS/BLUE SHIELD | Attending: Family Medicine | Admitting: Family Medicine

## 2017-05-27 DIAGNOSIS — F329 Major depressive disorder, single episode, unspecified: Secondary | ICD-10-CM

## 2017-05-27 DIAGNOSIS — F1010 Alcohol abuse, uncomplicated: Secondary | ICD-10-CM | POA: Diagnosis present

## 2017-05-27 DIAGNOSIS — F10229 Alcohol dependence with intoxication, unspecified: Secondary | ICD-10-CM | POA: Diagnosis not present

## 2017-05-27 DIAGNOSIS — F17210 Nicotine dependence, cigarettes, uncomplicated: Secondary | ICD-10-CM | POA: Diagnosis present

## 2017-05-27 DIAGNOSIS — F3181 Bipolar II disorder: Secondary | ICD-10-CM | POA: Diagnosis present

## 2017-05-27 DIAGNOSIS — F4310 Post-traumatic stress disorder, unspecified: Secondary | ICD-10-CM | POA: Diagnosis present

## 2017-05-27 DIAGNOSIS — F419 Anxiety disorder, unspecified: Secondary | ICD-10-CM

## 2017-05-27 DIAGNOSIS — Z720 Tobacco use: Secondary | ICD-10-CM

## 2017-05-27 DIAGNOSIS — Z811 Family history of alcohol abuse and dependence: Secondary | ICD-10-CM

## 2017-05-27 LAB — RAPID URINE DRUG SCREEN, HOSP PERFORMED
Amphetamines: NOT DETECTED
BENZODIAZEPINES: NOT DETECTED
Barbiturates: NOT DETECTED
COCAINE: POSITIVE — AB
OPIATES: NOT DETECTED
Tetrahydrocannabinol: NOT DETECTED

## 2017-05-27 LAB — COMPREHENSIVE METABOLIC PANEL
ALBUMIN: 4.9 g/dL (ref 3.5–5.0)
ALT: 23 U/L (ref 17–63)
ANION GAP: 15 (ref 5–15)
AST: 37 U/L (ref 15–41)
Alkaline Phosphatase: 55 U/L (ref 38–126)
BILIRUBIN TOTAL: 0.6 mg/dL (ref 0.3–1.2)
BUN: 9 mg/dL (ref 6–20)
CALCIUM: 8.9 mg/dL (ref 8.9–10.3)
CO2: 21 mmol/L — ABNORMAL LOW (ref 22–32)
Chloride: 103 mmol/L (ref 101–111)
Creatinine, Ser: 0.75 mg/dL (ref 0.61–1.24)
GFR calc non Af Amer: >60 mL/min (ref >60)
GLUCOSE: 84 mg/dL (ref 65–99)
POTASSIUM: 3.7 mmol/L (ref 3.5–5.1)
Sodium: 139 mmol/L (ref 135–145)
TOTAL PROTEIN: 8.4 g/dL — AB (ref 6.5–8.1)

## 2017-05-27 LAB — CBC
HCT: 47 % (ref 39.0–52.0)
Hemoglobin: 16.6 g/dL (ref 13.0–17.0)
MCH: 27.3 pg (ref 26.0–34.0)
MCHC: 35.3 g/dL (ref 30.0–36.0)
MCV: 77.2 fL — ABNORMAL LOW (ref 78.0–100.0)
PLATELETS: 225 10*3/uL (ref 150–400)
RBC: 6.09 MIL/uL — ABNORMAL HIGH (ref 4.22–5.81)
RDW: 14.6 % (ref 11.5–15.5)
WBC: 11.4 10*3/uL — AB (ref 4.0–10.5)

## 2017-05-27 LAB — SALICYLATE LEVEL: Salicylate Lvl: <7 mg/dL (ref 2.8–30.0)

## 2017-05-27 LAB — ETHANOL: ALCOHOL ETHYL (B): 331 mg/dL — AB (ref <5)

## 2017-05-27 LAB — ACETAMINOPHEN LEVEL

## 2017-05-27 MED ORDER — CHLORDIAZEPOXIDE HCL 25 MG PO CAPS
50.0000 mg | ORAL_CAPSULE | Freq: Four times a day (QID) | ORAL | Status: DC
  Administered 2017-05-27 – 2017-05-28 (×3): 50 mg via ORAL
  Filled 2017-05-27 (×3): qty 2

## 2017-05-27 MED ORDER — LORAZEPAM 2 MG/ML IJ SOLN
2.0000 mg | INTRAMUSCULAR | Status: DC | PRN
  Administered 2017-05-27 – 2017-05-28 (×3): 2 mg via INTRAVENOUS
  Filled 2017-05-27 (×3): qty 1

## 2017-05-27 MED ORDER — ONDANSETRON HCL 4 MG PO TABS: 4.0000 mg | ORAL_TABLET | Freq: Four times a day (QID) | ORAL | Status: DC | PRN

## 2017-05-27 MED ORDER — CHLORDIAZEPOXIDE HCL 25 MG PO CAPS: 25.0000 mg | ORAL_CAPSULE | Freq: Every day | ORAL | Status: DC

## 2017-05-27 MED ORDER — LORAZEPAM 1 MG PO TABS
0.0000 mg | ORAL_TABLET | Freq: Four times a day (QID) | ORAL | Status: DC
  Administered 2017-05-27: 2 mg via ORAL
  Filled 2017-05-27: qty 2

## 2017-05-27 MED ORDER — KCL IN DEXTROSE-NACL 10-5-0.45 MEQ/L-%-% IV SOLN
INTRAVENOUS | Status: DC
  Administered 2017-05-27: 23:00:00 via INTRAVENOUS
  Filled 2017-05-27 (×3): qty 1000

## 2017-05-27 MED ORDER — VITAMIN B-1 100 MG PO TABS
100.0000 mg | ORAL_TABLET | Freq: Every day | ORAL | Status: DC
  Administered 2017-05-28: 100 mg via ORAL
  Filled 2017-05-27: qty 1

## 2017-05-27 MED ORDER — ONDANSETRON HCL 4 MG/2ML IJ SOLN: 4.0000 mg | Freq: Four times a day (QID) | INTRAMUSCULAR | Status: DC | PRN

## 2017-05-27 MED ORDER — THIAMINE HCL 100 MG/ML IJ SOLN
Freq: Once | INTRAVENOUS | Status: AC
  Administered 2017-05-27: 16:00:00 via INTRAVENOUS
  Filled 2017-05-27: qty 1000

## 2017-05-27 MED ORDER — LORAZEPAM 1 MG PO TABS: 0.0000 mg | ORAL_TABLET | Freq: Two times a day (BID) | ORAL | Status: DC

## 2017-05-27 MED ORDER — LORAZEPAM 2 MG/ML IJ SOLN
0.0000 mg | Freq: Four times a day (QID) | INTRAMUSCULAR | Status: DC
  Administered 2017-05-27: 2 mg via INTRAVENOUS
  Filled 2017-05-27: qty 1

## 2017-05-27 MED ORDER — FOLIC ACID 1 MG PO TABS
1.0000 mg | ORAL_TABLET | Freq: Every day | ORAL | Status: DC
  Administered 2017-05-28: 1 mg via ORAL
  Filled 2017-05-27: qty 1

## 2017-05-27 MED ORDER — SENNOSIDES-DOCUSATE SODIUM 8.6-50 MG PO TABS: 1.0000 | ORAL_TABLET | Freq: Every evening | ORAL | Status: DC | PRN

## 2017-05-27 MED ORDER — CHLORDIAZEPOXIDE HCL 25 MG PO CAPS: 25.0000 mg | ORAL_CAPSULE | Freq: Three times a day (TID) | ORAL | Status: DC

## 2017-05-27 MED ORDER — ACETAMINOPHEN 325 MG PO TABS
650.0000 mg | ORAL_TABLET | Freq: Four times a day (QID) | ORAL | Status: DC | PRN
  Administered 2017-05-27 – 2017-05-28 (×3): 650 mg via ORAL
  Filled 2017-05-27 (×3): qty 2

## 2017-05-27 MED ORDER — LORAZEPAM 2 MG/ML IJ SOLN: 0.0000 mg | Freq: Two times a day (BID) | INTRAMUSCULAR | Status: DC

## 2017-05-27 MED ORDER — FAMOTIDINE 20 MG PO TABS
40.0000 mg | ORAL_TABLET | Freq: Two times a day (BID) | ORAL | Status: DC
  Administered 2017-05-27 – 2017-05-28 (×2): 40 mg via ORAL
  Filled 2017-05-27 (×2): qty 2

## 2017-05-27 MED ORDER — THIAMINE HCL 100 MG/ML IJ SOLN
100.0000 mg | Freq: Every day | INTRAMUSCULAR | Status: DC
  Administered 2017-05-27: 100 mg via INTRAVENOUS
  Filled 2017-05-27: qty 2

## 2017-05-27 MED ORDER — ENOXAPARIN SODIUM 40 MG/0.4ML ~~LOC~~ SOLN
40.0000 mg | SUBCUTANEOUS | Status: DC
  Administered 2017-05-27: 40 mg via SUBCUTANEOUS
  Filled 2017-05-27: qty 0.4

## 2017-05-27 MED ORDER — CHLORDIAZEPOXIDE HCL 25 MG PO CAPS: 25.0000 mg | ORAL_CAPSULE | Freq: Two times a day (BID) | ORAL | Status: DC

## 2017-05-27 MED ORDER — CHLORDIAZEPOXIDE HCL 25 MG PO CAPS: 25.0000 mg | ORAL_CAPSULE | Freq: Four times a day (QID) | ORAL | Status: DC

## 2017-05-27 MED ORDER — ACETAMINOPHEN 650 MG RE SUPP: 650.0000 mg | Freq: Four times a day (QID) | RECTAL | Status: DC | PRN

## 2017-05-27 NOTE — ED Notes (Signed)
Patient lying on bed saying, " I'm getting ready to have a seizure, I can feel it in my neck.  Patient's arms and legs beginning to shake and face and body reddened.  Nurse gave him 2mg  of Ativan IV.  Dr. Freida Busman notified.

## 2017-05-27 NOTE — ED Notes (Signed)
Dr. Freida Busman notified of CIWA score.

## 2017-05-27 NOTE — ED Triage Notes (Signed)
Per EMS pt's girlfriend called EMS due to 5 day drinking binge, pt was initially cooperative. During transport, pt had sudden change in demeanor, jumped out of straps of stretcher, mimicked shooting passing cars with imaginary gun, became highly agitated. Is yelling "I need my rifle." Asking everyone to lay down. Confused, agitated, crying.

## 2017-05-27 NOTE — H&P (Signed)
History and Physical    [name] KMQ:[id number] DOB: Jul 11, 1971  DOA: 05/27/2017 PCP: Patient, No Pcp Per  Patient coming from: home   Chief Complaint: Drinking to much   HPI: [name] is a 46 y.o. male with medical history significant of alcohol dependence, with prior history of alcohol withdrawals with seizures and DTs. Patient presented to the emergency department requesting help due to been drinking too much alcohol. Patient has been drinking for the past 6 days about 40 cans of beers. Patient denies any tactile abscess aspiration, suicidal or homicidal ideas. Only complaint at this point is mild headache and anxiety. Of note patient has a history of PTSD, he relates alcohol use to this issue. Patient denies any other drug use although UDS positive for cocaine.   ED Course: UDS positive for cocaine, BAL 331, patient was combative and agitated and received 4 mg of Ativan. Otherwise unremarkable  Review of Systems:   General: no changes in body weight, no fever chills or decrease in energy.  HEENT: no blurry vision, hearing changes or sore throat Respiratory: no dyspnea, coughing, wheezing CV: no chest pain, no palpitations GI: no nausea, vomiting, abdominal pain, diarrhea, constipation GU: no dysuria, burning on urination, increased urinary frequency, hematuria  Ext:. No deformities,  Neuro: no unilateral weakness, numbness, or tingling, no vision change or hearing loss Skin: No rashes, lesions or wounds. MSK: No muscle spasm, no deformity, no limitation of range of movement in spin Heme: No easy bruising.  Travel history: No recent long distant travel.   Past Medical History:  Diagnosis Date  . Active smoker   . Alcohol abuse   . Anxiety   . Bipolar 2 disorder (HCC)   . Depression     Past Surgical History:  Procedure Laterality Date  . LYMPH NODE DISSECTION       reports that he has been smoking Cigarettes.  He has a 40.00 pack-year smoking  history. He has never used smokeless tobacco. He reports that he drinks about 151.2 oz of alcohol per week . He reports that he uses drugs, including Cocaine.  Allergies  Allergen Reactions  . Paxil [Paroxetine Hcl] Hives    Family History  Problem Relation Age of Onset  . Alcohol abuse Father     Prior to Admission medications   Medication Sig Start Date End Date Taking? Authorizing Provider  chlordiazePOXIDE (LIBRIUM) 25 MG capsule 25mg  TID x1d, then 25mg  BIDx1d, then 25mg  daily x1d, then stop 04/28/17  Yes Alekh, Kshitiz, MD  folic acid (FOLVITE) 1 MG tablet Take 1 tablet (1 mg total) by mouth daily. 04/29/17  Yes Glade Lloyd, MD  gabapentin (NEURONTIN) 100 MG capsule Take 100 mg by mouth 3 (three) times daily.   Yes [provider]  Multiple Vitamin (MULTIVITAMIN WITH MINERALS) TABS tablet Take 1 tablet by mouth daily. 04/29/17  Yes Glade Lloyd, MD  tadalafil (CIALIS) 5 MG tablet Take 5 mg by mouth every 3 (three) days.   Yes [provider]  thiamine 100 MG tablet Take 1 tablet (100 mg total) by mouth daily. 04/29/17  Yes Glade Lloyd, MD    Physical Exam: Vitals:   05/27/17 1600  BP: 122/80  Pulse: (!) 107  Resp: 18  SpO2: 95%     Constitutional: Mild anxious, Eyes: PERRL, lids and conjunctivae normal ENMT: Mucous membranes are moist. Posterior pharynx clear of any exudate or lesions. Neck: normal, supple, no masses, no thyromegaly Respiratory: clear to auscultation bilaterally, no wheezing,  no crackles. Normal respiratory effort.  Cardiovascular:  S1-S2, tachycardia at 106 no murmurs / rubs / gallops. No extremity edema. 2+ pedal pulses.  Abdomen: no tenderness, no masses palpated. No hepatosplenomegaly. Bowel sounds positive.  Musculoskeletal: no clubbing / cyanosis. No joint deformity upper and lower extremities. Good ROM lSkin: no rashes, lesions, ulcers. No induration Neurologic: CN 2-12 grossly intact. Sensation intact, DTR normal. Strength  5/5 in all 4.  Mild tremor felt by touching Psychiatric: Alert and oriented x 3. Mood depressed.    Labs on Admission: I have personally reviewed following labs and imaging studies  CBC:  Recent Labs Lab 05/27/17 1501  WBC 11.4*  HGB 16.6  HCT 47.0  MCV 77.2*  PLT 225   Basic Metabolic Panel:  Recent Labs Lab 05/27/17 1501  NA 139  K 3.7  CL 103  CO2 21*  GLUCOSE 84  BUN 9  CREATININE 0.75  CALCIUM 8.9   GFR: CrCl cannot be calculated (Unknown ideal weight.). Liver Function Tests:  Recent Labs Lab 05/27/17 1501  AST 37  ALT 23  ALKPHOS 55  BILITOT 0.6  PROT 8.4*  ALBUMIN 4.9   No results for input(s): LIPASE, AMYLASE in the last 168 hours. No results for input(s): AMMONIA in the last 168 hours. Coagulation Profile: No results for input(s): INR, PROTIME in the last 168 hours. Cardiac Enzymes: No results for input(s): CKTOTAL, CKMB, CKMBINDEX, TROPONINI in the last 168 hours. BNP (last 3 results) No results for input(s): PROBNP in the last 8760 hours. HbA1C: No results for input(s): HGBA1C in the last 72 hours. CBG: No results for input(s): GLUCAP in the last 168 hours. Lipid Profile: No results for input(s): CHOL, HDL, LDLCALC, TRIG, CHOLHDL, LDLDIRECT in the last 72 hours. Thyroid Function Tests: No results for input(s): TSH, T4TOTAL, FREET4, T3FREE, THYROIDAB in the last 72 hours. Anemia Panel: No results for input(s): VITAMINB12, FOLATE, FERRITIN, TIBC, IRON, RETICCTPCT in the last 72 hours. Urine analysis: No results found for: COLORURINE, APPEARANCEUR, LABSPEC, PHURINE, GLUCOSEU, HGBUR, BILIRUBINUR, KETONESUR, PROTEINUR, UROBILINOGEN, NITRITE, LEUKOCYTESUR Sepsis Labs: !!!!!!!!!!!!!!!!!!!!!!!!!!!!!!!!!!!!!!!!!!!! @LABRCNTIP (procalcitonin:4,lacticidven:4) )No results found for this or any previous visit (from the past 240 hour(s)).   Radiological Exams on Admission: No results found.  EKG: ordered and interpreted by me. Sinus tachycardia,  otherwise normal EKG  Assessment/Plan Alcohol dependence/intoxication, monitor for EtOH withdrawal  Admit to telemetry  BAL 311, Hx of Alcohol withdrawal in the past with DT and seizures  CIWA done by me 10 Librium protocol as patient is tolerating PO  Ativan IV PRN agitation and CIWA > 10 IV fluids Thiamine and Folic acid Alcohol cessation discussed  Social worker consulted   Tobacco abuse  Tobacco cessation discussed  Nicotine patch   Anxiety and depression  Not on medications It may benefit from antianxiety medication prior to d/c if patient is committed to avoid alcohol and rehab No SI/HI  DVT prophylaxis: Lovenox  Code Status: FULL  Family Communication: Girlfriend at bedside  Disposition Plan: Anticipate discharge to previous home environment.  Consults called: None  Admission status: Inpatient/Tele    Latrelle Dodrill MD [facility] Pager: Text Page via [email or web address]  [phone]  If 7PM-7AM, please contact night-coverage [email or web address] Password [facility]  05/27/2017, 4:38 PM

## 2017-05-27 NOTE — Clinical Social Work Note (Signed)
Clinical Social Work Assessment  Patient Details  Name: [name] MRN: [id number] Date of Birth: [birth year]  Date of referral:  05/27/17               Reason for consult:  Substance Use/ETOH Abuse                Permission sought to share information with:  Facility Sport and exercise psychologist, Family Supports Permission granted to share information::  No  Name::        Agency::     Relationship::     Contact Information:     Housing/Transportation Living arrangements for the past 2 months:  Single Family Home Source of Information:  Patient Patient Interpreter Needed:  None Criminal Activity/Legal Involvement Pertinent to Current Situation/Hospitalization:    Significant Relationships:   (Girlfriend) Lives with:   (Unknown) Do you feel safe going back to the place where you live?  Yes Need for family participation in patient care:  No (Coment)  Care giving concerns:  None listed by pt/family    Social Worker assessment / plan:  CSW met with pt and pt was unable to speak and formulate a plan. Pt was unable to begin or complete assessment.  Pt could not answer questions and was at best semi-coherent and at times presented as asleep. CSW provided active listening and validated pt's concerns.   CSW Dept was not given permission to contact facilities on behalf of the pt at this time.   Employment status:  [employer] information:  Managed Care PT Recommendations:  Not assessed at this time Information / Referral to community resources:     Patient/Family's Response to care:  Patient not alert and oriented.  Patient unable to agree to plan.  Pt's girlfriend, per notes, supportive and strongly involved in pt.'s care.    Patient/Family's Understanding of and Emotional Response to Diagnosis, Current Treatment, and Prognosis:  Still assessing   Emotional Assessment Appearance:  Appears stated age Attitude/Demeanor/Rapport:  Unable to Assess Affect (typically  observed):  Unable to Assess Orientation:  Fluctuating Orientation (Suspected and/or reported Sundowners) Alcohol / Substance use:  Alcohol Use, Illicit Drugs Psych involvement (Current and /or in the community):     Discharge Needs  Concerns to be addressed:  Substance Abuse Concerns Readmission within the last 30 days:  No Current discharge risk:  None Barriers to Discharge:  Active Substance Use   Claudine Mouton, LCSWA 05/27/2017, 7:29 PM

## 2017-05-27 NOTE — ED Notes (Signed)
Patient feeling calmer now.  Eating lunch tray with girlfriend at bedside.

## 2017-05-27 NOTE — ED Provider Notes (Addendum)
[facility]-EMERGENCY DEPT Provider Note   CSN: [id number] Arrival date & time: 05/27/17  1423     History   Chief Complaint No chief complaint on file.   HPI [name] is a 46 y.o. male.  46 year old male here requesting help with alcohol addiction. 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 Denies any tactile hallucinations. No abdominal pain or vomiting. Denies any suicidal or homicidal ideations. While being transported by the paramedics, patient became very combative and agitated. Does have a history of PTSD and thought he was having a flashback. Has been in alcohol rehabilitation for the past. Denies any illicit drug use.      Past Medical History:  Diagnosis Date  . Active smoker   . Alcohol abuse   . Anxiety   . Bipolar 2 disorder (HCC)   . Depression     Patient Active Problem List   Diagnosis Date Noted  . Delirium tremens (HCC) 03/02/2017  . GI bleed 10/06/2013  . Marijuana abuse 10/06/2013  . Alcohol withdrawal (HCC) 10/05/2013  . Suicidal ideation 10/05/2013  . Alcohol abuse 10/26/2011  . Alcohol withdrawal syndrome (HCC) 10/26/2011  . Depression 10/26/2011    Past Surgical History:  Procedure Laterality Date  . LYMPH NODE DISSECTION         Home Medications    Prior to Admission medications   Medication Sig Start Date End Date Taking? Authorizing Provider  chlordiazePOXIDE (LIBRIUM) 25 MG capsule 25mg  TID x1d, then 25mg  BIDx1d, then 25mg  daily x1d, then stop 04/28/17   Glade Lloyd, MD  folic acid (FOLVITE) 1 MG tablet Take 1 tablet (1 mg total) by mouth daily. 04/29/17   Glade Lloyd, MD  gabapentin (NEURONTIN) 100 MG capsule Take 100 mg by mouth 3 (three) times daily.    [provider]  Multiple Vitamin (MULTIVITAMIN  WITH MINERALS) TABS tablet Take 1 tablet by mouth daily. 04/29/17   Glade Lloyd, MD  tadalafil (CIALIS) 5 MG tablet Take 5 mg by mouth every 3 (three) days.    [provider]  thiamine 100 MG tablet Take 1 tablet (100 mg total) by mouth daily. 04/29/17   Glade Lloyd, MD    Family History Family History  Problem Relation Age of Onset  . Alcohol abuse Father     Social History Social History  Substance Use Topics  . Smoking status: Current Every Day Smoker    Packs/day: 2.00    Years: 20.00    Types: Cigarettes  . Smokeless tobacco: Never Used  . Alcohol use 151.2 oz/week    252 Cans of beer per week     Comment: heavy      Allergies   Paxil [paroxetine hcl]   Review of Systems Review of Systems  All other systems reviewed and are negative.    Physical Exam Updated Vital Signs There were no vitals taken for this visit.  Physical Exam  Constitutional: He is oriented to person, place, and time. He appears well-developed and well-nourished.  Non-toxic appearance. No distress.  HENT:  Head: Normocephalic and atraumatic.  Eyes: Pupils are equal, round, and reactive to light. Conjunctivae, EOM and lids are normal.  Neck: Normal range of motion. Neck supple. No tracheal deviation present. No thyroid mass present.  Cardiovascular: Normal rate, regular rhythm and normal heart sounds.  Exam reveals no gallop.   No murmur heard. Pulmonary/Chest: Effort normal and breath sounds normal. No stridor. No respiratory distress. He has no decreased breath sounds. He has no wheezes. He has no rhonchi. He has no rales.  Abdominal: Soft. Normal appearance and bowel sounds are normal. He exhibits no distension. There is no tenderness. There is no rebound and no CVA tenderness.  Musculoskeletal: Normal range of motion. He exhibits no edema or tenderness.  Neurological: He is alert and oriented to person, place, and time. He displays tremor. No cranial nerve deficit or sensory  deficit. GCS eye subscore is 4. GCS verbal subscore is 5. GCS motor subscore is 6.  Skin: Skin is warm and dry. No abrasion and no rash noted.  Psychiatric: His affect is labile. His speech is slurred. He is withdrawn. He expresses no suicidal plans and no homicidal plans.  Nursing note and vitals reviewed.    ED Treatments / Results  Labs (all labs ordered are listed, but only abnormal results are displayed) Labs Reviewed  CBC - Abnormal; Notable for the following:       Result Value   WBC 11.4 (*)    RBC 6.09 (*)    MCV 77.2 (*)    All other components within normal limits  RAPID URINE DRUG SCREEN, HOSP PERFORMED - Abnormal; Notable for the following:    Cocaine POSITIVE (*)    All other components within normal limits  COMPREHENSIVE METABOLIC PANEL  ETHANOL  SALICYLATE LEVEL  ACETAMINOPHEN LEVEL    EKG  EKG Interpretation None       Radiology No results found.  Procedures Procedures (including critical care time)  Medications Ordered in ED Medications  LORazepam (ATIVAN) injection 0-4 mg ( Intravenous See Alternative 05/27/17 1527)    Or  LORazepam (ATIVAN) tablet 0-4 mg (2 mg Oral Given 05/27/17 1527)  LORazepam (ATIVAN) injection 0-4 mg (not administered)    Or  LORazepam (ATIVAN) tablet 0-4 mg (not administered)  thiamine (VITAMIN B-1) tablet 100 mg (not administered)    Or  thiamine (B-1) injection 100 mg (not administered)  sodium chloride 0.9 % 1,000 mL with thiamine 100 mg, folic acid 1 mg, multivitamins adult 10 mL infusion (not administered)     Initial Impression / Assessment and Plan / ED Course  I have reviewed the triage vital signs and the nursing notes.  Pertinent labs & imaging results that were available during my care of the patient were reviewed by me and considered in my medical decision making (see chart for details).     The patient had a banana bag ordered. I suspect that he is going to alcohol draw and was treated with Ativan.that  patient has a CIWA SCORE of 23. Patient require additional dosing of IV Ativan due to increased agitation.Will likely admit to the hospital for observation for treatment of alcohol withdrawal  Final Clinical Impressions(s) / ED Diagnoses   Final diagnoses:  None    New Prescriptions New Prescriptions   No medications on file     Lorre Nick, MD 05/27/17 1531    Lorre Nick, MD 05/27/17 1531    Lorre Nick, MD 05/27/17 1550

## 2017-05-27 NOTE — ED Notes (Signed)
Date and time results received: 05/27/17 3:55 PM  Test: ETOH Critical Value: 331  Name of Provider Notified: Lorre Nick, MD  Orders Received? Or Actions Taken?: awaiting orders

## 2017-05-27 NOTE — Progress Notes (Signed)
Consult request has been received. CSW attempting to follow up at present time.    Pt was unable to begin or complete assessment.  Pt could not answer questions and was at best semi-coherent and at times presented as asleep.  Please reconsult if future social work needs arise.  CSW signing off, as social work intervention is no longer needed.  [name]. Sarayah Bacchi, Francesco Sor, CSI Clinical Social Worker Ph: [phone]

## 2017-05-27 NOTE — ED Notes (Signed)
Bed: WA29 Expected date:  Expected time:  Means of arrival:  Comments: 46 yo male, ETOH

## 2017-05-27 NOTE — ED Provider Notes (Deleted)
MSE was initiated and I personally evaluated the patient and placed orders (if any) at  2:50 PM on May 27, 2017.  The patient appears stable so that the remainder of the MSE may be completed by another provider.  Patient here complaining ofalcohol abuse with last drink today. Denies SI or HI but does have history of PTSD became very agitated. He is now calm at this point. Medical screening labs ordered   Lorre Nick, MD 05/27/17 1451

## 2017-05-28 DIAGNOSIS — F4310 Post-traumatic stress disorder, unspecified: Secondary | ICD-10-CM

## 2017-05-28 LAB — CBC WITH DIFFERENTIAL/PLATELET
BASOS ABS: 0 10*3/uL (ref 0.0–0.1)
BASOS PCT: 0 %
EOS ABS: 0.1 10*3/uL (ref 0.0–0.7)
EOS PCT: 2 %
HCT: 43.5 % (ref 39.0–52.0)
Hemoglobin: 14.8 g/dL (ref 13.0–17.0)
Lymphocytes Relative: 35 %
Lymphs Abs: 2.3 10*3/uL (ref 0.7–4.0)
MCH: 26.9 pg (ref 26.0–34.0)
MCHC: 34 g/dL (ref 30.0–36.0)
MCV: 78.9 fL (ref 78.0–100.0)
Monocytes Absolute: 0.6 10*3/uL (ref 0.1–1.0)
Monocytes Relative: 10 %
Neutro Abs: 3.6 10*3/uL (ref 1.7–7.7)
Neutrophils Relative %: 53 %
PLATELETS: 216 10*3/uL (ref 150–400)
RBC: 5.51 MIL/uL (ref 4.22–5.81)
RDW: 14.6 % (ref 11.5–15.5)
WBC: 6.7 10*3/uL (ref 4.0–10.5)

## 2017-05-28 LAB — BASIC METABOLIC PANEL
Anion gap: 8 (ref 5–15)
BUN: 14 mg/dL (ref 6–20)
CHLORIDE: 105 mmol/L (ref 101–111)
CO2: 27 mmol/L (ref 22–32)
CREATININE: 0.77 mg/dL (ref 0.61–1.24)
Calcium: 8.7 mg/dL — ABNORMAL LOW (ref 8.9–10.3)
GFR calc non Af Amer: >60 mL/min (ref >60)
Glucose, Bld: 100 mg/dL — ABNORMAL HIGH (ref 65–99)
POTASSIUM: 3.7 mmol/L (ref 3.5–5.1)
SODIUM: 140 mmol/L (ref 135–145)

## 2017-05-28 MED ORDER — NICOTINE POLACRILEX 2 MG MT GUM
4.0000 mg | CHEWING_GUM | OROMUCOSAL | Status: DC | PRN
  Administered 2017-05-28: 4 mg via ORAL
  Filled 2017-05-28: qty 2

## 2017-05-28 MED ORDER — ESCITALOPRAM OXALATE 10 MG PO TABS
10.0000 mg | ORAL_TABLET | Freq: Every day | ORAL | Status: DC
  Administered 2017-05-28: 10 mg via ORAL
  Filled 2017-05-28: qty 1

## 2017-05-28 MED ORDER — LORAZEPAM 2 MG/ML IJ SOLN
2.0000 mg | INTRAMUSCULAR | Status: DC | PRN
  Administered 2017-05-28: 2 mg via INTRAVENOUS
  Filled 2017-05-28: qty 1

## 2017-05-28 MED ORDER — NICOTINE 21 MG/24HR TD PT24
21.0000 mg | MEDICATED_PATCH | Freq: Every day | TRANSDERMAL | Status: DC
  Administered 2017-05-28: 21 mg via TRANSDERMAL
  Filled 2017-05-28: qty 1

## 2017-05-28 MED ORDER — NICOTINE POLACRILEX 2 MG MT GUM
4.0000 mg | CHEWING_GUM | OROMUCOSAL | Status: DC | PRN
  Filled 2017-05-28: qty 2

## 2017-05-28 NOTE — Progress Notes (Signed)
Patient woke up from his nap feeling better. He said he wanted to go home and sign the AMA form. I informed Dr. Edward Jolly that he was leaving. Patient signed the form and has left.

## 2017-05-28 NOTE — Progress Notes (Signed)
PROGRESS NOTE [facility]   [name]   MBE:[id number] DOB: 1971/10/05  DOA: 05/27/2017 PCP: Patient, No Pcp Per   Brief Narrative:  [name] is a 46 y.o. male with medical history significant of alcohol dependence, with prior history of alcohol withdrawals with seizures and DTs. Patient presented to the emergency department requesting help due to been drinking too much alcohol. Patient admitted for alcohol intoxication and impending alcohol withdrawal. Patient was started on Librium protocol and CIWA regimen.   Subjective: Patient seen and examined with our and at bedside, patient wanted to go home and is very anxious and agitated. We strongly advised patient to stay for further monitoring is alcohol level will be trending down in the next 24 hours which is the most dangerous part for alcohol withdrawals.  Assessment & Plan: Alcohol dependence/intoxication, monitor for EtOH withdrawal  On admission, BAL 311, Hx of Alcohol withdrawal in the past with DT and seizures  CIWA ~ 12-14 Continue Librium taper Ativan IV PRN agitation and CIWA > 12 Can D/C IV fluids Continue Thiamine and Folic acid Alcohol cessation discussed  Child psychotherapist consulted - appreciate input patient reported he have a very supportive AA group  Tobacco abuse  Tobacco cessation discussed  Patient did not like nicotine patch, nicotine gum prescribed  Anxiety and depression/ PTSD  Not on medications Patient report having symptoms of PTSD he is started Lexapro Report no SI or HI  DVT prophylaxis: Lovenox Code Status: Full Family Communication: Girlfriend at bedside Disposition Plan: Home in the next 24-48 hours   Consultants:   None  Procedures:   None  Antimicrobials:  None    Objective: Vitals:   05/28/17 0338 05/28/17 0641 05/28/17 0942 05/28/17 1413  BP: 113/69 114/65  124/75  Pulse: (!) 103 81  88  Resp: 20 16  (!) 28  Temp: 98.3 F (36.8 C) 98.4 F (36.9 C)   98.6 F (37 C)  TempSrc: Oral Oral  Oral  SpO2: 99% 99%  98%  Weight:   69.9 kg (154 lb)   Height:   5\' 9"  (1.753 m)     Intake/Output Summary (Last 24 hours) at 05/28/17 1532 Last data filed at 05/28/17 1200  Gross per 24 hour  Intake              720 ml  Output              350 ml  Net              370 ml   Filed Weights   05/28/17 0942  Weight: 69.9 kg (154 lb)    Examination:  General exam: Extremely anxious, clammy HEENT: OP moist and clear Respiratory system: Clear to auscultation. No wheezes,crackle or rhonchi Cardiovascular system: S1 & S2 heard, RRR. No JVD, murmurs, rubs or gallops Gastrointestinal system: Abdomen is nondistended, soft and nontender.  Central nervous system: Alert and oriented. No focal neurological deficits. Visible tremors Extremities: No pedal edema.  Skin: No rashes, lesions or ulcers Psychiatry: Mood & affect anxious and depressed   Data Reviewed: I have personally reviewed following labs and imaging studies  CBC:  Recent Labs Lab 05/27/17 1501 05/28/17 0848  WBC 11.4* 6.7  NEUTROABS  --  3.6  HGB 16.6 14.8  HCT 47.0 43.5  MCV 77.2* 78.9  PLT 225 216   Basic Metabolic Panel:  Recent Labs Lab 05/27/17 1501 05/28/17 0848  NA 139 140  K 3.7 3.7  CL 103  105  CO2 21* 27  GLUCOSE 84 100*  BUN 9 14  CREATININE 0.75 0.77  CALCIUM 8.9 8.7*   GFR: Estimated Creatinine Clearance: 115.3 mL/min (by C-G formula based on SCr of 0.77 mg/dL). Liver Function Tests:  Recent Labs Lab 05/27/17 1501  AST 37  ALT 23  ALKPHOS 55  BILITOT 0.6  PROT 8.4*  ALBUMIN 4.9   No results for input(s): LIPASE, AMYLASE in the last 168 hours. No results for input(s): AMMONIA in the last 168 hours. Coagulation Profile: No results for input(s): INR, PROTIME in the last 168 hours. Cardiac Enzymes: No results for input(s): CKTOTAL, CKMB, CKMBINDEX, TROPONINI in the last 168 hours. BNP (last 3 results) No results for input(s): PROBNP in the  last 8760 hours. HbA1C: No results for input(s): HGBA1C in the last 72 hours. CBG: No results for input(s): GLUCAP in the last 168 hours. Lipid Profile: No results for input(s): CHOL, HDL, LDLCALC, TRIG, CHOLHDL, LDLDIRECT in the last 72 hours. Thyroid Function Tests: No results for input(s): TSH, T4TOTAL, FREET4, T3FREE, THYROIDAB in the last 72 hours. Anemia Panel: No results for input(s): VITAMINB12, FOLATE, FERRITIN, TIBC, IRON, RETICCTPCT in the last 72 hours. Sepsis Labs: No results for input(s): PROCALCITON, LATICACIDVEN in the last 168 hours.  No results found for this or any previous visit (from the past 240 hour(s)).    Radiology Studies: No results found.    Scheduled Meds: . chlordiazePOXIDE  50 mg Oral QID   Followed by  . chlordiazePOXIDE  25 mg Oral QID   Followed by  . [START ON 05/29/2017] chlordiazePOXIDE  25 mg Oral TID   Followed by  . [START ON 05/30/2017] chlordiazePOXIDE  25 mg Oral BID   Followed by  . [START ON 05/31/2017] chlordiazePOXIDE  25 mg Oral Daily  . enoxaparin (LOVENOX) injection  40 mg Subcutaneous Q24H  . escitalopram  10 mg Oral Daily  . famotidine  40 mg Oral BID  . folic acid  1 mg Oral Daily  . thiamine  100 mg Oral Daily   Continuous Infusions:   LOS: 1 day    Time spent: Total of 25 minutes spent with pt, greater than 50% of which was spent in discussion of  treatment, counseling and coordination of care    Latrelle Dodrill, MD Pager: Text Page via [email or web address]   If 7PM-7AM, please contact night-coverage [email or web address] 05/28/2017, 3:32 PM

## 2017-05-28 NOTE — Plan of Care (Signed)
Problem: Health Behavior/Discharge Planning: Goal: Ability to manage health-related needs will improve Outcome: Not Progressing Patient is noncompliant and doesn't understand the risk of ETOH withdrawal.  Problem: Physical Regulation: Goal: Ability to maintain clinical measurements within normal limits will improve Outcome: Not Progressing Patient is noncompliant and doesn't understand the risk of ETOH withdrawal.  Problem: Education: Goal: Knowledge of disease or condition will improve Outcome: Not Progressing Patient is noncompliant and doesn't understand the risk of ETOH withdrawal.  Problem: Physical Regulation: Goal: Complications related to the disease process, condition or treatment will be avoided or minimized Outcome: Not Progressing Patient is noncompliant and doesn't understand the risk of ETOH withdrawal.  Comments: Patient has been very anxious, restless and pacing the halls. This morning he was wanting to leave AMA because he said he had things he needed to take care of. He is not understanding the severity and risk of ETOH withdrawal. This afternoon his CIWA score reached a 24 but he seems to be sleeping now since he had the scheduled 50mg  Librium and 2mg  of Ativan IV.

## 2017-05-28 NOTE — Progress Notes (Signed)
Pt has been very anxious and restless. I did his 12PM  CIWA score at 1:30pm and he scored a 24. I gave him his scheduled Librium 50mg  and called Dr. Edward Jolly to inform him that the CIWA was high and I was going to give 2 mg of Ativan IV. I also gave him 650 mg of Tylenol for his headache to help bring the CIWA score down also. I have checked on him several times and he is finally sleeping.

## 2017-05-28 NOTE — Progress Notes (Addendum)
CSW met with patient at bedside, explain role and reason for visit- provide SA resources. Patient reports, " I am not interested in residential treatment. I already have a very supportive AA group." Patient reports is under stress because of relationship with his GF. He feels he can work things out with GF. Patient appreciative of CSW services. No other needs identified, CSW signing off.   Kathrin Greathouse, Latanya Presser, MSW Clinical Social Worker 5E and Psychiatric Service Line [phone] 05/28/2017  2:38 PM

## 2017-05-28 NOTE — Care Management Note (Signed)
Case Management Note  Patient Details  Name: [name] MRN: [id number] Date of Birth: Apr 25, 1971  Subjective/Objective:     Drug and ETOH abuse               Action/Plan: Date:  May 28, 2017  Chart reviewed for concurrent status and case management needs.  Will continue to follow patient progress.  Discharge Planning: following for needs  Expected discharge date: [id number]  Marcelle Smiling, BSN, [place], [place]   [phone]   Expected Discharge Date:  05/31/17               Expected Discharge Plan:  Home/Self Care  In-House Referral:  Clinical Social Work  Discharge planning Services  CM Consult  Post Acute Care Choice:    Choice offered to:     DME Arranged:    DME Agency:     HH Arranged:    HH Agency:     Status of Service:  In process, will continue to follow  If discussed at Long Length of Stay Meetings, dates discussed:    Additional Comments:  Golda Acre, RN 05/28/2017, 9:48 AM

## 2017-05-29 NOTE — Discharge Summary (Signed)
AMA  Patient at this time expresses desire to leave the Hospital immidiately, patient has been warned that this is not Medically advisable at this time, and can result in Medical complications like Death and Disability, patient understands and accepts the risks involved and assumes full responsibilty of this decision. Patient was alert and oriented x 3 and demonstrated capacity of making his own medical decisions.  Latrelle Dodrill M.D on 05/29/2017 at 9:15 AM  [facility]  Time < 30 minutes  Last Note Below   PROGRESS NOTE [facility]   [name]       ZOX:[id number] DOB: 1971-06-03  DOA: 05/27/2017 PCP: Patient, No Pcp Per        Brief Narrative:  [name] is a 46 y.o.malewith medical history significant of alcohol dependence, with prior history of alcohol withdrawals with seizures and DTs. Patient presented to the emergency department requesting help due to been drinking too much alcohol. Patient admitted for alcohol intoxication and impending alcohol withdrawal. Patient was started on Librium protocol and CIWA regimen.   Subjective: Patient seen and examined with our and at bedside, patient wanted to go home and is very anxious and agitated. We strongly advised patient to stay for further monitoring is alcohol level will be trending down in the next 24 hours which is the most dangerous part for alcohol withdrawals.  Assessment & Plan: Alcohol dependence/intoxication, monitor for EtOH withdrawal  On admission, BAL 311, Hx of Alcohol withdrawal in the past with DT and seizures  CIWA ~ 12-14 Continue Librium taper Ativan IV PRN agitation and CIWA > 12 Can D/C IV fluids Continue Thiamine and Folic acid Alcohol cessation discussed  Child psychotherapist consulted - appreciate input patient reported he have a very  supportive AA group  Tobacco abuse  Tobacco cessation discussed  Patient did not like nicotine patch, nicotine gum prescribed  Anxiety and depression/ PTSD  Not on medications Patient report having symptoms of PTSD he is started Lexapro Report no SI or HI  DVT prophylaxis: Lovenox Code Status: Full Family Communication: Girlfriend at bedside Disposition Plan: Home in the next 24-48 hours   Consultants:   None  Procedures:   None  Antimicrobials:  None    Objective: Vitals:   05/28/17 0338 05/28/17 0641 05/28/17 0942 05/28/17 1413  BP: 113/69 114/65  124/75  Pulse: (!) 103 81  88  Resp: 20 16  (!) 28  Temp: 98.3 F (36.8 C) 98.4 F (36.9 C)  98.6 F (37 C)  TempSrc: Oral Oral  Oral  SpO2: 99% 99%  98%  Weight:   69.9 kg (154 lb)   Height:   5\' 9"  (1.753 m)     Intake/Output Summary (Last 24 hours) at 05/28/17 1532 Last data filed at 05/28/17 1200  Gross per 24 hour  Intake              720 ml  Output              350 ml  Net              370 ml   Filed Weights   05/28/17 0942  Weight: 69.9  kg (154 lb)    Examination:  General exam: Extremely anxious, clammy HEENT: OP moist and clear Respiratory system: Clear to auscultation. No wheezes,crackle or rhonchi Cardiovascular system: S1 & S2 heard, RRR. No JVD, murmurs, rubs or gallops Gastrointestinal system: Abdomen is nondistended, soft and nontender.  Central nervous system: Alert and oriented. No focal neurological deficits. Visible tremors Extremities: No pedal edema.  Skin: No rashes, lesions or ulcers Psychiatry: Mood & affect anxious and depressed   Data Reviewed: I have personally reviewed following labs and imaging studies  CBC:  Last Labs    Recent Labs Lab 05/27/17 1501 05/28/17 0848  WBC 11.4* 6.7  NEUTROABS  --  3.6  HGB 16.6 14.8  HCT 47.0 43.5  MCV 77.2* 78.9  PLT 225 216     Basic Metabolic Panel:  Last Labs    Recent Labs Lab  05/27/17 1501 05/28/17 0848  NA 139 140  K 3.7 3.7  CL 103 105  CO2 21* 27  GLUCOSE 84 100*  BUN 9 14  CREATININE 0.75 0.77  CALCIUM 8.9 8.7*     GFR: Estimated Creatinine Clearance: 115.3 mL/min (by C-G formula based on SCr of 0.77 mg/dL). Liver Function Tests:  Last Labs    Recent Labs Lab 05/27/17 1501  AST 37  ALT 23  ALKPHOS 55  BILITOT 0.6  PROT 8.4*  ALBUMIN 4.9     Last Labs   No results for input(s): LIPASE, AMYLASE in the last 168 hours.   Last Labs   No results for input(s): AMMONIA in the last 168 hours.   Coagulation Profile: Last Labs   No results for input(s): INR, PROTIME in the last 168 hours.   Cardiac Enzymes: Last Labs   No results for input(s): CKTOTAL, CKMB, CKMBINDEX, TROPONINI in the last 168 hours.   BNP (last 3 results) Recent Labs (within last 365 days)  No results for input(s): PROBNP in the last 8760 hours.   HbA1C: Recent Labs (last 2 labs)   No results for input(s): HGBA1C in the last 72 hours.   CBG: Last Labs   No results for input(s): GLUCAP in the last 168 hours.   Lipid Profile: Recent Labs (last 2 labs)   No results for input(s): CHOL, HDL, LDLCALC, TRIG, CHOLHDL, LDLDIRECT in the last 72 hours.   Thyroid Function Tests: Recent Labs (last 2 labs)   No results for input(s): TSH, T4TOTAL, FREET4, T3FREE, THYROIDAB in the last 72 hours.   Anemia Panel: Recent Labs (last 2 labs)   No results for input(s): VITAMINB12, FOLATE, FERRITIN, TIBC, IRON, RETICCTPCT in the last 72 hours.   Sepsis Labs: Last Labs   No results for input(s): PROCALCITON, LATICACIDVEN in the last 168 hours.    No results found for this or any previous visit (from the past 240 hour(s)).    Radiology Studies: Imaging Results (Last 48 hours)  No results found.      Scheduled Meds: . chlordiazePOXIDE  50 mg Oral QID   Followed by  . chlordiazePOXIDE  25 mg Oral QID   Followed by  . [START ON 05/29/2017] chlordiazePOXIDE   25 mg Oral TID   Followed by  . [START ON 05/30/2017] chlordiazePOXIDE  25 mg Oral BID   Followed by  . [START ON 05/31/2017] chlordiazePOXIDE  25 mg Oral Daily  . enoxaparin (LOVENOX) injection  40 mg Subcutaneous Q24H  . escitalopram  10 mg Oral Daily  . famotidine  40 mg Oral BID  .  folic acid  1 mg Oral Daily  . thiamine  100 mg Oral Daily   Continuous Infusions:   LOS: 1 day    Time spent: Total of 25 minutes spent with pt, greater than 50% of which was spent in discussion of  treatment, counseling and coordination of care    Latrelle Dodrill, MD Pager: Text Page via [email or web address]   If 7PM-7AM, please contact night-coverage [email or web address] 05/28/2017, 3:32 PM

## 2017-07-06 ENCOUNTER — Other Ambulatory Visit: Payer: Self-pay

## 2017-07-06 ENCOUNTER — Encounter (HOSPITAL_COMMUNITY): Payer: Self-pay | Admitting: Family Medicine

## 2017-07-06 ENCOUNTER — Emergency Department (HOSPITAL_COMMUNITY)
Admission: EM | Admit: 2017-07-06 | Discharge: 2017-07-07 | Discharge status: Against Medical Advice | Payer: BLUE CROSS/BLUE SHIELD | Source: Home / Self Care | Attending: Emergency Medicine | Admitting: Emergency Medicine

## 2017-07-06 DIAGNOSIS — F10920 Alcohol use, unspecified with intoxication, uncomplicated: Secondary | ICD-10-CM | POA: Insufficient documentation

## 2017-07-06 DIAGNOSIS — F10129 Alcohol abuse with intoxication, unspecified: Secondary | ICD-10-CM | POA: Diagnosis present

## 2017-07-06 DIAGNOSIS — F319 Bipolar disorder, unspecified: Secondary | ICD-10-CM | POA: Insufficient documentation

## 2017-07-06 DIAGNOSIS — Z5321 Procedure and treatment not carried out due to patient leaving prior to being seen by health care provider: Secondary | ICD-10-CM | POA: Insufficient documentation

## 2017-07-06 DIAGNOSIS — Y906 Blood alcohol level of 120-199 mg/100 ml: Secondary | ICD-10-CM | POA: Diagnosis not present

## 2017-07-06 DIAGNOSIS — F17210 Nicotine dependence, cigarettes, uncomplicated: Secondary | ICD-10-CM | POA: Diagnosis not present

## 2017-07-06 LAB — COMPREHENSIVE METABOLIC PANEL
ALBUMIN: 4.6 g/dL (ref 3.5–5.0)
ALK PHOS: 58 U/L (ref 38–126)
ALT: 35 U/L (ref 17–63)
ANION GAP: 11 (ref 5–15)
AST: 45 U/L — AB (ref 15–41)
BILIRUBIN TOTAL: 0.6 mg/dL (ref 0.3–1.2)
BUN: 8 mg/dL (ref 6–20)
CALCIUM: 9.2 mg/dL (ref 8.9–10.3)
CO2: 28 mmol/L (ref 22–32)
CREATININE: 0.86 mg/dL (ref 0.61–1.24)
Chloride: 104 mmol/L (ref 101–111)
GFR calc Af Amer: >60 mL/min (ref >60)
GFR calc non Af Amer: >60 mL/min (ref >60)
GLUCOSE: 126 mg/dL — AB (ref 65–99)
Potassium: 4.1 mmol/L (ref 3.5–5.1)
SODIUM: 143 mmol/L (ref 135–145)
TOTAL PROTEIN: 8.3 g/dL — AB (ref 6.5–8.1)

## 2017-07-06 LAB — CBC
HCT: 45.8 % (ref 39.0–52.0)
Hemoglobin: 16.1 g/dL (ref 13.0–17.0)
MCH: 27.3 pg (ref 26.0–34.0)
MCHC: 35.2 g/dL (ref 30.0–36.0)
MCV: 77.8 fL — AB (ref 78.0–100.0)
PLATELETS: 259 10*3/uL (ref 150–400)
RBC: 5.89 MIL/uL — ABNORMAL HIGH (ref 4.22–5.81)
RDW: 14.7 % (ref 11.5–15.5)
WBC: 7.7 10*3/uL (ref 4.0–10.5)

## 2017-07-06 LAB — RAPID URINE DRUG SCREEN, HOSP PERFORMED
AMPHETAMINES: NOT DETECTED
Barbiturates: NOT DETECTED
Benzodiazepines: NOT DETECTED
Cocaine: NOT DETECTED
OPIATES: NOT DETECTED
Tetrahydrocannabinol: POSITIVE — AB

## 2017-07-06 LAB — ETHANOL: Alcohol, Ethyl (B): 314 mg/dL (ref <10)

## 2017-07-06 NOTE — ED Notes (Signed)
Pt standing in hallway stating that he is having a seizure right now and wants to smoke a cigarette. Pt instructed to return to room and have a seat in chair until evaluated by provider.

## 2017-07-06 NOTE — ED Notes (Signed)
Bed: [facility] Expected date:  Expected time:  Means of arrival:  Comments: 

## 2017-07-06 NOTE — ED Notes (Signed)
Provided patient warm blanket x 2 and pillow. Lights dimmed.

## 2017-07-06 NOTE — ED Notes (Addendum)
Pt c/o alcohol withdrawals and SI. Reports drinking 24-48 beers a day. D/t in triage.

## 2017-07-07 ENCOUNTER — Encounter (HOSPITAL_COMMUNITY): Payer: Self-pay | Admitting: Emergency Medicine

## 2017-07-07 ENCOUNTER — Observation Stay (HOSPITAL_COMMUNITY)
Admission: EM | Admit: 2017-07-07 | Discharge: 2017-07-08 | Disposition: A | Discharge status: Home / Self Care | Payer: BLUE CROSS/BLUE SHIELD | Attending: Internal Medicine | Admitting: Internal Medicine

## 2017-07-07 DIAGNOSIS — F10239 Alcohol dependence with withdrawal, unspecified: Secondary | ICD-10-CM

## 2017-07-07 DIAGNOSIS — F10939 Alcohol use, unspecified with withdrawal, unspecified: Secondary | ICD-10-CM

## 2017-07-07 DIAGNOSIS — Z720 Tobacco use: Secondary | ICD-10-CM

## 2017-07-07 LAB — RAPID URINE DRUG SCREEN, HOSP PERFORMED
AMPHETAMINES: NOT DETECTED
Barbiturates: NOT DETECTED
Benzodiazepines: NOT DETECTED
Cocaine: NOT DETECTED
OPIATES: NOT DETECTED
Tetrahydrocannabinol: POSITIVE — AB

## 2017-07-07 LAB — CBC WITH DIFFERENTIAL/PLATELET
BASOS PCT: 0 %
Basophils Absolute: 0 10*3/uL (ref 0.0–0.1)
Eosinophils Absolute: 0.1 10*3/uL (ref 0.0–0.7)
Eosinophils Relative: 1 %
HEMATOCRIT: 41 % (ref 39.0–52.0)
HEMOGLOBIN: 14.5 g/dL (ref 13.0–17.0)
LYMPHS PCT: 41 %
Lymphs Abs: 3.2 10*3/uL (ref 0.7–4.0)
MCH: 27.1 pg (ref 26.0–34.0)
MCHC: 35.4 g/dL (ref 30.0–36.0)
MCV: 76.6 fL — AB (ref 78.0–100.0)
MONOS PCT: 7 %
Monocytes Absolute: 0.5 10*3/uL (ref 0.1–1.0)
NEUTROS ABS: 4.1 10*3/uL (ref 1.7–7.7)
NEUTROS PCT: 51 %
Platelets: 210 10*3/uL (ref 150–400)
RBC: 5.35 MIL/uL (ref 4.22–5.81)
RDW: 14.7 % (ref 11.5–15.5)
WBC: 7.9 10*3/uL (ref 4.0–10.5)

## 2017-07-07 LAB — I-STAT CHEM 8, ED
BUN: 8 mg/dL (ref 6–20)
CREATININE: 0.9 mg/dL (ref 0.61–1.24)
Calcium, Ion: 1.03 mmol/L — ABNORMAL LOW (ref 1.15–1.40)
Chloride: 102 mmol/L (ref 101–111)
GLUCOSE: 102 mg/dL — AB (ref 65–99)
HCT: 44 % (ref 39.0–52.0)
Hemoglobin: 15 g/dL (ref 13.0–17.0)
POTASSIUM: 3.5 mmol/L (ref 3.5–5.1)
Sodium: 145 mmol/L (ref 135–145)
TCO2: 29 mmol/L (ref 22–32)

## 2017-07-07 LAB — BASIC METABOLIC PANEL
Anion gap: 12 (ref 5–15)
BUN: 11 mg/dL (ref 6–20)
CHLORIDE: 105 mmol/L (ref 101–111)
CO2: 28 mmol/L (ref 22–32)
Calcium: 8.8 mg/dL — ABNORMAL LOW (ref 8.9–10.3)
Creatinine, Ser: 0.7 mg/dL (ref 0.61–1.24)
GFR calc non Af Amer: >60 mL/min (ref >60)
Glucose, Bld: 103 mg/dL — ABNORMAL HIGH (ref 65–99)
POTASSIUM: 3.5 mmol/L (ref 3.5–5.1)
SODIUM: 145 mmol/L (ref 135–145)

## 2017-07-07 LAB — ETHANOL: ALCOHOL ETHYL (B): 186 mg/dL — AB (ref <10)

## 2017-07-07 MED ORDER — LORAZEPAM 1 MG PO TABS
1.0000 mg | ORAL_TABLET | Freq: Four times a day (QID) | ORAL | Status: DC | PRN
  Administered 2017-07-08: 1 mg via ORAL
  Filled 2017-07-07: qty 1

## 2017-07-07 MED ORDER — ACETAMINOPHEN 650 MG RE SUPP: 650.0000 mg | Freq: Four times a day (QID) | RECTAL | Status: DC | PRN

## 2017-07-07 MED ORDER — ZOLPIDEM TARTRATE 5 MG PO TABS
5.0000 mg | ORAL_TABLET | Freq: Every evening | ORAL | Status: DC | PRN
  Administered 2017-07-08: 5 mg via ORAL
  Filled 2017-07-07: qty 1

## 2017-07-07 MED ORDER — THIAMINE HCL 100 MG/ML IJ SOLN: 100.0000 mg | Freq: Every day | INTRAMUSCULAR | Status: DC

## 2017-07-07 MED ORDER — ALUM & MAG HYDROXIDE-SIMETH 200-200-20 MG/5ML PO SUSP: 30.0000 mL | Freq: Four times a day (QID) | ORAL | Status: DC | PRN

## 2017-07-07 MED ORDER — ADULT MULTIVITAMIN W/MINERALS CH
1.0000 | ORAL_TABLET | Freq: Every day | ORAL | Status: DC
  Administered 2017-07-08 (×2): 1 via ORAL
  Filled 2017-07-07 (×2): qty 1

## 2017-07-07 MED ORDER — DEXTROSE-NACL 5-0.45 % IV SOLN
INTRAVENOUS | Status: DC
  Administered 2017-07-07: 20:00:00 via INTRAVENOUS
  Filled 2017-07-07 (×3): qty 1000

## 2017-07-07 MED ORDER — ENOXAPARIN SODIUM 40 MG/0.4ML ~~LOC~~ SOLN
40.0000 mg | SUBCUTANEOUS | Status: DC
  Administered 2017-07-08: 40 mg via SUBCUTANEOUS
  Filled 2017-07-07: qty 0.4

## 2017-07-07 MED ORDER — VITAMIN B-1 100 MG PO TABS
100.0000 mg | ORAL_TABLET | Freq: Every day | ORAL | Status: DC
  Administered 2017-07-08: 100 mg via ORAL
  Filled 2017-07-07 (×2): qty 1

## 2017-07-07 MED ORDER — ONDANSETRON HCL 4 MG PO TABS
4.0000 mg | ORAL_TABLET | Freq: Three times a day (TID) | ORAL | Status: DC | PRN
  Administered 2017-07-07: 4 mg via ORAL
  Filled 2017-07-07: qty 1

## 2017-07-07 MED ORDER — LORAZEPAM 2 MG/ML IJ SOLN
1.0000 mg | Freq: Once | INTRAMUSCULAR | Status: AC
Start: 2017-07-07 — End: 2017-07-07
  Administered 2017-07-07: 1 mg via INTRAVENOUS
  Filled 2017-07-07: qty 1

## 2017-07-07 MED ORDER — NICOTINE 21 MG/24HR TD PT24
21.0000 mg | MEDICATED_PATCH | Freq: Every day | TRANSDERMAL | Status: DC
  Administered 2017-07-07 – 2017-07-08 (×2): 21 mg via TRANSDERMAL
  Filled 2017-07-07 (×3): qty 1

## 2017-07-07 MED ORDER — LORAZEPAM 2 MG/ML IJ SOLN
0.0000 mg | Freq: Four times a day (QID) | INTRAMUSCULAR | Status: DC
Start: 2017-07-07 — End: 2017-07-07
  Administered 2017-07-07 (×2): 2 mg via INTRAVENOUS
  Filled 2017-07-07 (×2): qty 1

## 2017-07-07 MED ORDER — ONDANSETRON HCL 4 MG/2ML IJ SOLN
4.0000 mg | Freq: Four times a day (QID) | INTRAMUSCULAR | Status: DC | PRN
  Administered 2017-07-08: 4 mg via INTRAVENOUS
  Filled 2017-07-07: qty 2

## 2017-07-07 MED ORDER — LORAZEPAM 2 MG/ML IJ SOLN
1.0000 mg | Freq: Four times a day (QID) | INTRAMUSCULAR | Status: DC | PRN
  Administered 2017-07-07 – 2017-07-08 (×2): 1 mg via INTRAVENOUS
  Filled 2017-07-07 (×2): qty 1

## 2017-07-07 MED ORDER — ONDANSETRON HCL 4 MG PO TABS: 4.0000 mg | ORAL_TABLET | Freq: Four times a day (QID) | ORAL | Status: DC | PRN

## 2017-07-07 MED ORDER — LORAZEPAM 1 MG PO TABS: 0.0000 mg | ORAL_TABLET | Freq: Four times a day (QID) | ORAL | Status: DC

## 2017-07-07 MED ORDER — VITAMIN B-1 100 MG PO TABS
100.0000 mg | ORAL_TABLET | Freq: Every day | ORAL | Status: DC
  Administered 2017-07-07: 100 mg via ORAL
  Filled 2017-07-07: qty 1

## 2017-07-07 MED ORDER — THIAMINE HCL 100 MG/ML IJ SOLN
100.0000 mg | Freq: Every day | INTRAMUSCULAR | Status: DC
  Administered 2017-07-08: 100 mg via INTRAVENOUS
  Filled 2017-07-07: qty 2

## 2017-07-07 MED ORDER — LORAZEPAM 1 MG PO TABS: 0.0000 mg | ORAL_TABLET | Freq: Two times a day (BID) | ORAL | Status: DC

## 2017-07-07 MED ORDER — IBUPROFEN 200 MG PO TABS
600.0000 mg | ORAL_TABLET | Freq: Three times a day (TID) | ORAL | Status: DC | PRN
  Administered 2017-07-07: 600 mg via ORAL
  Filled 2017-07-07: qty 3

## 2017-07-07 MED ORDER — FOLIC ACID 1 MG PO TABS
1.0000 mg | ORAL_TABLET | Freq: Every day | ORAL | Status: DC
  Administered 2017-07-08 (×2): 1 mg via ORAL
  Filled 2017-07-07 (×2): qty 1

## 2017-07-07 MED ORDER — ACETAMINOPHEN 325 MG PO TABS
650.0000 mg | ORAL_TABLET | Freq: Four times a day (QID) | ORAL | Status: DC | PRN
  Administered 2017-07-08: 650 mg via ORAL
  Filled 2017-07-07 (×2): qty 2

## 2017-07-07 MED ORDER — THIAMINE HCL 100 MG/ML IJ SOLN
Freq: Once | INTRAVENOUS | Status: AC
  Administered 2017-07-07: 09:00:00 via INTRAVENOUS
  Filled 2017-07-07: qty 1000

## 2017-07-07 MED ORDER — LORAZEPAM 2 MG/ML IJ SOLN
0.0000 mg | Freq: Two times a day (BID) | INTRAMUSCULAR | Status: DC
Start: 2017-07-09 — End: 2017-07-07

## 2017-07-07 NOTE — ED Notes (Signed)
Bed: [facility] Expected date:  Expected time:  Means of arrival:  Comments: 

## 2017-07-07 NOTE — ED Notes (Signed)
Call report to John at [phone] at [phone].

## 2017-07-07 NOTE — ED Notes (Signed)
Pt was told by EDP that she would see him eventually, but she had other patients she needed to see first. Patient became irate and and started down the hall to leave. Patient stated he needed ativan. Patient A&O x4 and ambulatory with a steady gait. Patient has left the property.

## 2017-07-07 NOTE — ED Triage Notes (Signed)
Pt presents for alcohol intoxication after leaving AMA previously. Pt currently alert and oriented x 4 no acute distress.

## 2017-07-07 NOTE — ED Notes (Signed)
Pt came in after leaving AMA with alcohol on his breath.

## 2017-07-07 NOTE — Progress Notes (Signed)
Consult request has been received. CSW attempting to follow up at present time.  Nazareth Kirk F. Melessa Cowell, LCSW, LCAS, CSI Clinical Social Worker Ph: [phone]  

## 2017-07-07 NOTE — H&P (Signed)
[facility] H&P    Patient Demographics:    [name], is a 46 y.o. male  MRN: [id number]  DOB - 05-19-1971  Admit Date - 07/07/2017  Referring MD/NP/PA: Wynetta Emery  Outpatient Primary MD for the patient is Patient, No Pcp Per  Patient coming from: Home  Chief Complaint  Patient presents with  . Alcohol Intoxication      HPI:    [name]  is a 46 y.o. male, With history of bipolar disorder, alcohol abuse came to hospital last night with alcohol intoxication requesting detox. Patient's last drink was 6 AM yesterday morning and has history of alcohol withdrawal seizures and DTs. He denies chest pain, no shortness of breath. No nausea vomiting or diarrhea. Patient says that his drinking for almost 20 years and does binge drinking. In the ED, alcohol level initially was 314, repeat alcohol level this morning is 186. Patient started on CIWA protocol.  He denies dysuria, abdominal pain. Patient was prescribed gabapentin for depression as per patient, by his PCP but has not been taking at home.   Review of systems:    In addition to the HPI above,    All other systems reviewed and are negative.   With Past History of the following :    Past Medical History:  Diagnosis Date  . Active smoker   . Alcohol abuse   . Anxiety   . Bipolar 2 disorder (HCC)   . Depression       Past Surgical History:  Procedure Laterality Date  . LYMPH NODE DISSECTION        Social History:      Social History  Substance Use Topics  . Smoking status: Current Every Day Smoker    Packs/day: 2.00    Years: 20.00    Types: Cigarettes  . Smokeless tobacco: Never Used  . Alcohol use 151.2 oz/week    252 Cans of beer per week     Comment: Last: 6am this morning;        Family History :     Family History  Problem Relation Age of Onset  . Alcohol abuse Father      Home Medications:    Prior to Admission medications   Medication Sig Start Date End Date Taking? Authorizing Provider  chlordiazePOXIDE (LIBRIUM) 25 MG capsule  TID x1d, then  BIDx1d, then  daily x1d, then stop Patient not taking: Reported on 07/07/2017 04/28/17   Glade Lloyd, MD  folic acid (FOLVITE) 1 MG tablet Take 1 tablet (1 mg total) by mouth daily. Patient not taking: Reported on 07/07/2017 04/29/17   Glade Lloyd, MD  Multiple Vitamin (MULTIVITAMIN WITH MINERALS) TABS tablet Take 1 tablet by mouth daily. Patient not taking: Reported on 07/07/2017 04/29/17   Glade Lloyd, MD  thiamine 100 MG tablet Take 1 tablet (100 mg total) by mouth daily. Patient not taking: Reported on 07/07/2017 04/29/17   Glade Lloyd, MD     Allergies:     Allergies  Allergen Reactions  . Paxil [Paroxetine Hcl] Hives  Physical Exam:   Vitals  Blood pressure 136/85, pulse (!) 107, temperature 98.3 F (36.8 C), temperature source Oral, resp. rate 20, SpO2 95 %.  1.  General: Appears in no acute distress  2. Psychiatric:  Intact judgement and  insight, awake alert, oriented x 3.  3. Neurologic: No focal neurological deficits, all cranial nerves intact.Strength 5/5 all 4 extremities, sensation intact all 4 extremities, plantars down going.  4. Eyes :  anicteric sclerae, moist conjunctivae with no lid lag. PERRLA.  5. ENMT:  Oropharynx clear with moist mucous membranes and good dentition  6. Neck:  supple, no cervical lymphadenopathy appriciated, No thyromegaly  7. Respiratory : Normal respiratory effort, good air movement bilaterally,clear to  auscultation bilaterally  8. Cardiovascular : RRR, no gallops, rubs or murmurs, no leg edema  9. Gastrointestinal:  Positive bowel sounds, abdomen soft, non-tender to palpation,no hepatosplenomegaly, no rigidity or guarding       10. Skin:  No cyanosis, normal texture and turgor, no rash, lesions or ulcers  11.Musculoskeletal:  Good muscle  tone,  joints appear normal , no effusions,  normal range of motion    Data Review:    CBC  Recent Labs Lab 07/06/17 2225 07/07/17 0803 07/07/17 0849  WBC 7.7 7.9  --   HGB 16.1 14.5 15.0  HCT 45.8 41.0 44.0  PLT 259 210  --   MCV 77.8* 76.6*  --   MCH 27.3 27.1  --   MCHC 35.2 35.4  --   RDW 14.7 14.7  --   LYMPHSABS  --  3.2  --   MONOABS  --  0.5  --   EOSABS  --  0.1  --   BASOSABS  --  0.0  --    ------------------------------------------------------------------------------------------------------------------  Chemistries   Recent Labs Lab 07/06/17 2225 07/07/17 0803 07/07/17 0849  NA 143 145 145  K 4.1 3.5 3.5  CL 104 105 102  CO2 28 28  --   GLUCOSE 126* 103* 102*  BUN CREATININE 0.86 0.70 0.90  CALCIUM 9.2 8.8*  --   AST 45*  --   --   ALT 35  --   --   ALKPHOS 58  --   --   BILITOT 0.6  --   --    ------------------------------------------------------------------------------------------------------------------  ------------------------------------------------------------------------------------------------------------------ GFR: Estimated Creatinine Clearance: 107 mL/min (by C-G formula based on SCr of 0.9 mg/dL). Liver Function Tests:  Recent Labs Lab 07/06/17 2225  AST 45*  ALT 35  ALKPHOS 58  BILITOT 0.6  PROT 8.3*  ALBUMIN 4.6       Imaging Results:    No results found.     Assessment & Plan:    Active Problems:   Alcohol withdrawal (HCC)   1. Alcohol withdrawal- patient will be admitted for alcohol withdrawal, start CIWA protocol. Thiamine and folate. He has a history of seizures and DTs due to alcohol withdrawal. Will initiate seizure precautions. 2. Tobacco abuse-continue nicotine patch    DVT Prophylaxis-   Lovenox   AM Labs Ordered, also please review Full Orders  Family Communication: Admission, patients condition and plan of care including tests being ordered have been discussed with the patient  who  indicate understanding and agree with the plan and Code Status.  Code Status:  Full code  Admission status: Observation    Time spent in minutes : 60 minutes   LAMA,GAGAN S M.D on 07/07/2017 at 11:48 AM  Between 7am to 7pm -  Pager - [phone]. After 7pm go to [email or web address] - password [facility]  [facility] - Office  [phone]

## 2017-07-07 NOTE — ED Provider Notes (Signed)
[facility]-EMERGENCY DEPT Provider Note   CSN: [id number] Arrival date & time: 07/07/17  0221     History   Chief Complaint Chief Complaint  Patient presents with  . Alcohol Intoxication   HPI   Blood pressure 136/85, pulse (!) 107, temperature 98.3 F (36.8 C), temperature source Oral, resp. rate 20, SpO2 95 %.  [name] is a 46 y.o. male with past medical history significant for bipolar, alcohol abuse initially complaining of alcohol intoxication now requesting alcohol detox. Patient states that he last drank at 6 AM yesterday morning. He states that he has both seizures and DTs from alcohol withdrawals. He states that he would like to detox (requests to "stay in the hospital for a couple of days") in a permanent way and is not withdrawing simply because he does not have access to alcohol at the moment. Initially patient was seen before my arrival and he was combative and abusive, clinically intoxicated. Patient denies any headache, chest pain, shortness of breath but he feels shaky. He is concerned that he will have a seizure from his withdrawal. Denies any suicidal ideation, homicidal ideation, auditory or visual hallucinations.  Past Medical History:  Diagnosis Date  . Active smoker   . Alcohol abuse   . Anxiety   . Bipolar 2 disorder (HCC)   . Depression     Patient Active Problem List   Diagnosis Date Noted  . Delirium tremens (HCC) 03/02/2017  . GI bleed 10/06/2013  . Marijuana abuse 10/06/2013  . Alcohol withdrawal (HCC) 10/05/2013  . Suicidal ideation 10/05/2013  . Alcohol abuse 10/26/2011  . Alcohol withdrawal syndrome (HCC) 10/26/2011  . Depression 10/26/2011    Past Surgical History:  Procedure Laterality Date  . LYMPH NODE DISSECTION         Home Medications    Prior to Admission medications   Medication Sig Start Date End Date Taking? Authorizing Provider  chlordiazePOXIDE (LIBRIUM) 25 MG capsule  TID x1d, then  BIDx1d, then  daily  x1d, then stop Patient not taking: Reported on 07/07/2017 04/28/17   Glade Lloyd, MD  folic acid (FOLVITE) 1 MG tablet Take 1 tablet (1 mg total) by mouth daily. Patient not taking: Reported on 07/07/2017 04/29/17   Glade Lloyd, MD  Multiple Vitamin (MULTIVITAMIN WITH MINERALS) TABS tablet Take 1 tablet by mouth daily. Patient not taking: Reported on 07/07/2017 04/29/17   Glade Lloyd, MD  thiamine 100 MG tablet Take 1 tablet (100 mg total) by mouth daily. Patient not taking: Reported on 07/07/2017 04/29/17   Glade Lloyd, MD    Family History Family History  Problem Relation Age of Onset  . Alcohol abuse Father     Social History Social History  Substance Use Topics  . Smoking status: Current Every Day Smoker    Packs/day: 2.00    Years: 20.00    Types: Cigarettes  . Smokeless tobacco: Never Used  . Alcohol use 151.2 oz/week    252 Cans of beer per week     Comment: Last: 6am this morning;      Allergies   Paxil [paroxetine hcl]   Review of Systems Review of Systems  A complete review of systems was obtained and all systems are negative except as noted in the HPI and PMH.   Physical Exam Updated Vital Signs BP 136/85   Pulse (!) 107   Temp 98.3 F (36.8 C) (Oral)   Resp 20   SpO2 95%   Physical Exam  Constitutional: He is  oriented to person, place, and time. He appears well-developed and well-nourished. No distress.  HENT:  Head: Normocephalic and atraumatic.  Mouth/Throat: Oropharynx is clear and moist.  Eyes: Pupils are equal, round, and reactive to light. Conjunctivae and EOM are normal.  Neck: Normal range of motion.  Cardiovascular: Normal rate, regular rhythm and intact distal pulses.   Tachycardic, regular  Pulmonary/Chest: Effort normal and breath sounds normal.  Abdominal: Soft. There is no tenderness.  Musculoskeletal: Normal range of motion.  Neurological: He is alert and oriented to person, place, and time.  Agitated, positive tongue  fasciculations, positive asterixis   Skin: He is not diaphoretic.  Psychiatric: He has a normal mood and affect.  Nursing note and vitals reviewed.    ED Treatments / Results  Labs (all labs ordered are listed, but only abnormal results are displayed) Labs Reviewed  CBC WITH DIFFERENTIAL/PLATELET - Abnormal; Notable for the following:       Result Value   MCV 76.6 (*)    All other components within normal limits  BASIC METABOLIC PANEL - Abnormal; Notable for the following:    Glucose, Bld 103 (*)    Calcium 8.8 (*)    All other components within normal limits  ETHANOL - Abnormal; Notable for the following:    Alcohol, Ethyl (B) 186 (*)    All other components within normal limits  RAPID URINE DRUG SCREEN, HOSP PERFORMED - Abnormal; Notable for the following:    Tetrahydrocannabinol POSITIVE (*)    All other components within normal limits  I-STAT CHEM 8, ED - Abnormal; Notable for the following:    Glucose, Bld 102 (*)    Calcium, Ion 1.03 (*)    All other components within normal limits    EKG  EKG Interpretation None       Radiology No results found.  Procedures Procedures (including critical care time)  Medications Ordered in ED Medications  LORazepam (ATIVAN) injection 0-4 mg (2 mg Intravenous Given 07/07/17 1031)    Or  LORazepam (ATIVAN) tablet 0-4 mg ( Oral See Alternative 07/07/17 1031)  LORazepam (ATIVAN) injection 0-4 mg (not administered)    Or  LORazepam (ATIVAN) tablet 0-4 mg (not administered)  thiamine (VITAMIN B-1) tablet 100 mg (not administered)    Or  thiamine (B-1) injection 100 mg (not administered)  zolpidem (AMBIEN) tablet 5 mg (not administered)  ibuprofen (ADVIL,MOTRIN) tablet 600 mg (not administered)  nicotine (NICODERM CQ - dosed in mg/24 hours) patch 21 mg (not administered)  alum & mag hydroxide-simeth (MAALOX/MYLANTA) 200-200-20 MG/5ML suspension 30 mL (not administered)  ondansetron (ZOFRAN) tablet 4 mg (not administered)    dextrose 5 % and 0.9% NaCl with thiamine 100 mg, folic acid 1 mg, multivitamins adult 10 mL, magnesium sulfate 2 g infusion ( Intravenous New Bag/Given 07/07/17 0849)  LORazepam (ATIVAN) injection 1 mg (1 mg Intravenous Given 07/07/17 0836)     Initial Impression / Assessment and Plan / ED Course  I have reviewed the triage vital signs and the nursing notes.  Pertinent labs & imaging results that were available during my care of the patient were reviewed by me and considered in my medical decision making (see chart for details).    Vitals:   07/07/17 0311 07/07/17 0617  BP: 128/69 136/85  Pulse: (!) 124 (!) 107  Resp: 15 20  Temp: 98.3 F (36.8 C)   TempSrc: Oral   SpO2: 100% 95%    Medications  LORazepam (ATIVAN) injection 0-4 mg (2 mg Intravenous  Given 07/07/17 1031)    Or  LORazepam (ATIVAN) tablet 0-4 mg ( Oral See Alternative 07/07/17 1031)  LORazepam (ATIVAN) injection 0-4 mg (not administered)    Or  LORazepam (ATIVAN) tablet 0-4 mg (not administered)  thiamine (VITAMIN B-1) tablet 100 mg (not administered)    Or  thiamine (B-1) injection 100 mg (not administered)  zolpidem (AMBIEN) tablet 5 mg (not administered)  ibuprofen (ADVIL,MOTRIN) tablet 600 mg (not administered)  nicotine (NICODERM CQ - dosed in mg/24 hours) patch 21 mg (not administered)  alum & mag hydroxide-simeth (MAALOX/MYLANTA) 200-200-20 MG/5ML suspension 30 mL (not administered)  ondansetron (ZOFRAN) tablet 4 mg (not administered)  dextrose 5 % and 0.9% NaCl with thiamine 100 mg, folic acid 1 mg, multivitamins adult 10 mL, magnesium sulfate 2 g infusion ( Intravenous New Bag/Given 07/07/17 0849)  LORazepam (ATIVAN) injection 1 mg (1 mg Intravenous Given 07/07/17 0836)    [name] is 46 y.o. male presenting with Alcohol intoxication, he was initially combative, on my exam he is cooperative, blood alcohol level is 186, CIWA 12, this patient is actively withdrawing with an alcohol level of 200 and  has a history of alcohol withdrawal seizures will need medical admission for medical detox. Patient is adamant that he will stay in the hospital for detox.   Final Clinical Impressions(s) / ED Diagnoses   Final diagnoses:  Alcohol withdrawal syndrome with complication [facility])    New Prescriptions New Prescriptions   No medications on file     Kaylyn Lim 07/07/17 1128    Tegeler, Canary Brim, MD 07/07/17 1719

## 2017-07-07 NOTE — ED Provider Notes (Signed)
  1:26 AM Patient reportedly here for alcohol intoxication.  RN reported he was ambulating in triage, has eaten full meal.  Went to go see patient and he is up, wandering around in the hallway and yelling that no one has given him ativan.  He was tried to be assisted back into room by staff for assessment but refused.  He paced up and down the hallway for a few more minutes yelling to himself but eventually left the ED because .  He was in no apparent distress, ambulatory with steady gait.  I did not get to perform formal assessment.   Garlon Hatchet, PA-C 07/07/17 1610    Nicanor Alcon, April, MD 07/07/17 [phone]

## 2017-07-08 DIAGNOSIS — Z720 Tobacco use: Secondary | ICD-10-CM

## 2017-07-08 DIAGNOSIS — F10230 Alcohol dependence with withdrawal, uncomplicated: Secondary | ICD-10-CM

## 2017-07-08 LAB — CBC
HCT: 42.7 % (ref 39.0–52.0)
Hemoglobin: 14.5 g/dL (ref 13.0–17.0)
MCH: 26.8 pg (ref 26.0–34.0)
MCHC: 34 g/dL (ref 30.0–36.0)
MCV: 78.9 fL (ref 78.0–100.0)
Platelets: 205 10*3/uL (ref 150–400)
RBC: 5.41 MIL/uL (ref 4.22–5.81)
RDW: 14.8 % (ref 11.5–15.5)
WBC: 7.3 10*3/uL (ref 4.0–10.5)

## 2017-07-08 LAB — COMPREHENSIVE METABOLIC PANEL
ALBUMIN: 3.8 g/dL (ref 3.5–5.0)
ALT: 28 U/L (ref 17–63)
ANION GAP: 6 (ref 5–15)
AST: 38 U/L (ref 15–41)
Alkaline Phosphatase: 57 U/L (ref 38–126)
BUN: 10 mg/dL (ref 6–20)
CO2: 27 mmol/L (ref 22–32)
Calcium: 8.5 mg/dL — ABNORMAL LOW (ref 8.9–10.3)
Chloride: 107 mmol/L (ref 101–111)
Creatinine, Ser: 0.72 mg/dL (ref 0.61–1.24)
GFR calc Af Amer: >60 mL/min (ref >60)
GFR calc non Af Amer: >60 mL/min (ref >60)
GLUCOSE: 98 mg/dL (ref 65–99)
POTASSIUM: 3.6 mmol/L (ref 3.5–5.1)
SODIUM: 140 mmol/L (ref 135–145)
TOTAL PROTEIN: 6.9 g/dL (ref 6.5–8.1)
Total Bilirubin: 1.3 mg/dL — ABNORMAL HIGH (ref 0.3–1.2)

## 2017-07-08 MED ORDER — DEXTROSE-NACL 5-0.45 % IV SOLN
INTRAVENOUS | Status: DC
  Administered 2017-07-08: 10:00:00 via INTRAVENOUS

## 2017-07-08 MED ORDER — CHLORDIAZEPOXIDE HCL 5 MG PO CAPS: ORAL_CAPSULE | ORAL | 0 refills | Status: DC

## 2017-07-08 MED ORDER — NICOTINE 21 MG/24HR TD PT24: 21.0000 mg | MEDICATED_PATCH | Freq: Every day | TRANSDERMAL | 0 refills | Status: DC

## 2017-07-08 MED ORDER — THIAMINE HCL 100 MG PO TABS: 100.0000 mg | ORAL_TABLET | Freq: Every day | ORAL | 1 refills | Status: DC

## 2017-07-08 MED ORDER — FOLIC ACID 1 MG PO TABS: 1.0000 mg | ORAL_TABLET | Freq: Every day | ORAL | 1 refills | Status: DC

## 2017-07-08 MED ORDER — CHLORDIAZEPOXIDE HCL 5 MG PO CAPS
15.0000 mg | ORAL_CAPSULE | Freq: Three times a day (TID) | ORAL | Status: DC
  Administered 2017-07-08: 15 mg via ORAL
  Filled 2017-07-08: qty 3

## 2017-07-08 NOTE — Care Management Note (Signed)
Case Management Note  Patient Details  Name: [name] MRN: [id number] Date of Birth: 1970/12/09  Subjective/Objective:  46 y/o m admitted w/etohl. From home. D/c home today-CSW provided w/[facility] resources. May need bus pass if unable to reach family transp-CSW will provide if needed.  No CM needs.                Action/Plan:d/c home.   Expected Discharge Date:  07/08/17               Expected Discharge Plan:  Home/Self Care  In-House Referral:  Clinical Social Work  Discharge planning Services  CM Consult  Post Acute Care Choice:    Choice offered to:     DME Arranged:    DME Agency:     HH Arranged:    HH Agency:     Status of Service:  Completed, signed off  If discussed at [employer], dates discussed:    Additional Comments:  Lanier Clam, RN 07/08/2017, 12:57 PM

## 2017-07-08 NOTE — Clinical Social Work Note (Signed)
Clinical Social Work Assessment  Patient Details  Name: DANE KOPKE MRN: [id number] Date of Birth: [birth year]  Date of referral:  07/08/17               Reason for consult:  Substance Use/ETOH Abuse                Permission sought to share information with:    Permission granted to share information::  No  Name::        Agency::     Relationship::     Contact Information:     Housing/Transportation Living arrangements for the past 2 months:  Single Family Home Source of Information:  Patient Patient Interpreter Needed:  None Criminal Activity/Legal Involvement Pertinent to Current Situation/Hospitalization:  No - Comment as needed Significant Relationships:  Other Family Members, Friend Lives with:  Other (Comment) (unknown) Do you feel safe going back to the place where you live?  Yes Need for family participation in patient care:  No (Coment)  Care giving concerns:  Patient reported no care giving concerns.   Social Worker assessment / plan:  CSW spoke with patient at bedside regarding consult for current substance abuse. Patient reported that he is active with AA and has been for the past 15 years. Patient reported that he recently experienced the death of 2 closed ones which was a trigger to drink. CSW and patient discussed healthy coping skills, patient reported that he talks walks and reads. CSW and patient discussed patient's support system to assist during stressful times, patient reported that he has a sponsor, friends and family. Patient reported that he is not interested in other treatment options. CSW provided patient with local AA schedule. Patient requested assistance with transportation, CSW provided patient with bus pass. CSW inquired patient required any additional resources, patient reported none. CSW signing off no other needs identified at this time.   Employment status:   (unknown) Insurance information:  Managed Care PT Recommendations:  Not assessed at  this time Information / Referral to community resources:  Outpatient Substance Abuse Treatment Options  Patient/Family's Response to care:  Patient appreciative of CSW intervention and reported plan to continue with AA meetings.   Patient/Family's Understanding of and Emotional Response to Diagnosis, Current Treatment, and Prognosis:  Patient presented guarded and speech was goal oriented, patient answered questions but did not elaborate. Patient verbalized plan to continue going to AA meetings. Patient reported that he remains sober 6-8 months out of the year and sometimes drinks when he is triggered. CSW encouraged patient to continue going to AA meetings and to utilize healthy coping skills.   Emotional Assessment Appearance:  Appears stated age Attitude/Demeanor/Rapport:  Guarded Affect (typically observed):  Appropriate Orientation:  Oriented to Self, Oriented to Place, Oriented to  Time, Oriented to Situation Alcohol / Substance use:  Alcohol Use Psych involvement (Current and /or in the community):  No (Comment)  Discharge Needs  Concerns to be addressed:  No discharge needs identified Readmission within the last 30 days:  No Current discharge risk:  None Barriers to Discharge:  No Barriers Identified   Antionette Poles, LCSW 07/08/2017, 11:46 AM

## 2017-07-08 NOTE — Progress Notes (Addendum)
Pt is on seizure precautions, but [facility]ables does not have any pads available. They will be brought to the floor when they become available.

## 2017-07-08 NOTE — Plan of Care (Signed)
Problem: Safety: Goal: Ability to remain free from injury will improve Outcome: Not Progressing Pt not using call bell before he gets out of bed

## 2017-07-08 NOTE — Discharge Summary (Signed)
Physician Discharge Summary  [name] ZOX:[id number] DOB: Feb 18, 1971 DOA: 07/07/2017  PCP: Patient, No Pcp Per  Admit date: 07/07/2017 Discharge date: 07/08/2017  Time spent: 30 minutes  Recommendations for Outpatient Follow-up:  Repeat BMET to follow electrolytes and renal function  Please assist patient with alcohol withdrawal and tobacco cessation.  Discharge Diagnoses:  Active Problems:   Alcohol withdrawal (HCC)   Tobacco abuse Bipolar disorder  Discharge Condition: stable and improved. Patient discharged with librium tapering regimen. Instructed to follow up with PCP and to follow resources provided by SW to help him quit.  Diet recommendation: regular diet   Filed Weights   07/07/17 1841  Weight: 67.7 kg (149 lb 4 oz)    History of present illness:  46 y.o. male, With history of bipolar disorder, alcohol abuse came to hospital last night with alcohol intoxication requesting detox. Patient's last drink was 6 AM yesterday morning and has history of alcohol withdrawal seizures and DTs. He denies chest pain, no shortness of breath. No nausea vomiting or diarrhea. Patient says that his drinking for almost 20 years and does binge drinking. Place in observation for CIWA protocol and to safely manage withdrawal.  Hospital Course:  1-alcohol abuse/intoxication:  -no active withdrawal seen -patient responded well to CIWA use while inpatient and was discharge on librium tapering -resources for outpatient assistance provided (to help him quit drinking) -started on thiamine and folic acid -instructed to follow up with PCP in 10 days. -alcohol cessation counseling provided.  2-tobacco abuse -nicoderm prescribed at discharge -smoking cessation counseling provided  3-Bipolar disorder -actively not taking any medication at this time -patient will benefit of outpatient follow up with psychiatry and initiation of proper treatment. -normal affect, no SI and no hallucinations    Procedures:  None  Consultations:  None   Discharge Exam: Vitals:   07/07/17 2358 07/08/17 0653  BP: 126/76 120/72  Pulse: 66 63  Resp: 20 20  Temp: 98 F (36.7 C) 98.2 F (36.8 C)  SpO2: 97% 96%    General: afebrile, no CP, no SOB. denies nausea, vomiting or acute complaints. Cardiovascular: S1 and S2, no rubs, no gallops, no murmur Respiratory: CTA bilaterally Abd: soft, NT, ND, positive BS Neuro: CN intact, no focal deficit, following commands appropriately, AAOX3  Discharge Instructions   Discharge Instructions    Discharge instructions    Complete by:  As directed    Keep yourself well hydrated Take medications as prescribed  Stop alcohol consumption Stop smoking Please arrange follow up with PCP in 10 days     Current Discharge Medication List    START taking these medications   Details  nicotine (NICODERM CQ - DOSED IN MG/24 HOURS) 21 mg/24hr patch Place 1 patch (21 mg total) onto the skin daily. Qty: 28 patch, Refills: 0      CONTINUE these medications which have CHANGED   Details  chlordiazePOXIDE (LIBRIUM) 5 MG capsule Take 3 capsules three times a day X 3 days; then 2 capsules three times a day X 2 days; then 1 capsule by mouth three times a day X 2 days; then 1 capsule twice a day X 2 days; then 1 capsule daily X 2 days and stop librium. Qty: 51 capsule, Refills: 0    folic acid (FOLVITE) 1 MG tablet Take 1 tablet (1 mg total) by mouth daily. Qty: 30 tablet, Refills: 1    thiamine 100 MG tablet Take 1 tablet (100 mg total) by mouth daily. Qty: 30 tablet,  Refills: 1      CONTINUE these medications which have NOT CHANGED   Details  Multiple Vitamin (MULTIVITAMIN WITH MINERALS) TABS tablet Take 1 tablet by mouth daily. Qty: 30 tablet, Refills: 0       Allergies  Allergen Reactions  . Paxil [Paroxetine Hcl] Hives     The results of significant diagnostics from this hospitalization (including imaging, microbiology, ancillary and  laboratory) are listed below for reference.     Labs: Basic Metabolic Panel:  Recent Labs Lab 07/06/17 2225 07/07/17 0803 07/07/17 0849 07/08/17 0506  NA 143 145 145 140  K 4.1 3.5 3.5 3.6  CL 104 105 102 107  CO2 28 28  --  27  GLUCOSE 126* 103* 102* 98  BUN CREATININE 0.86 0.70 0.90 0.72  CALCIUM 9.2 8.8*  --  8.5*   Liver Function Tests:  Recent Labs Lab 07/06/17 2225 07/08/17 0506  AST 45* 38  ALT 35 28  ALKPHOS 58 57  BILITOT 0.6 1.3*  PROT 8.3* 6.9  ALBUMIN 4.6 3.8   CBC:  Recent Labs Lab 07/06/17 2225 07/07/17 0803 07/07/17 0849 07/08/17 0506  WBC 7.7 7.9  --  7.3  NEUTROABS  --  4.1  --   --   HGB 16.1 14.5 15.0 14.5  HCT 45.8 41.0 44.0 42.7  MCV 77.8* 76.6*  --  78.9  PLT 259 210  --  205    Signed:  Vassie Loll MD.  [facility] 07/08/2017, 12:47 PM

## 2017-08-26 ENCOUNTER — Emergency Department (HOSPITAL_COMMUNITY)
Admission: EM | Admit: 2017-08-26 | Discharge: 2017-08-26 | Disposition: A | Discharge status: Home / Self Care | Payer: BLUE CROSS/BLUE SHIELD | Attending: Emergency Medicine | Admitting: Emergency Medicine

## 2017-08-26 ENCOUNTER — Encounter (HOSPITAL_COMMUNITY): Payer: Self-pay

## 2017-08-26 DIAGNOSIS — Y908 Blood alcohol level of 240 mg/100 ml or more: Secondary | ICD-10-CM | POA: Diagnosis not present

## 2017-08-26 DIAGNOSIS — F10920 Alcohol use, unspecified with intoxication, uncomplicated: Secondary | ICD-10-CM | POA: Diagnosis present

## 2017-08-26 DIAGNOSIS — F10220 Alcohol dependence with intoxication, uncomplicated: Secondary | ICD-10-CM | POA: Insufficient documentation

## 2017-08-26 DIAGNOSIS — F17210 Nicotine dependence, cigarettes, uncomplicated: Secondary | ICD-10-CM | POA: Diagnosis not present

## 2017-08-26 DIAGNOSIS — F1010 Alcohol abuse, uncomplicated: Secondary | ICD-10-CM | POA: Diagnosis not present

## 2017-08-26 LAB — COMPREHENSIVE METABOLIC PANEL
ALT: 25 U/L (ref 17–63)
AST: 32 U/L (ref 15–41)
Albumin: 4.5 g/dL (ref 3.5–5.0)
Alkaline Phosphatase: 52 U/L (ref 38–126)
Anion gap: 9 (ref 5–15)
BUN: 8 mg/dL (ref 6–20)
CHLORIDE: 103 mmol/L (ref 101–111)
CO2: 27 mmol/L (ref 22–32)
CREATININE: 0.77 mg/dL (ref 0.61–1.24)
Calcium: 9 mg/dL (ref 8.9–10.3)
GFR calc Af Amer: >60 mL/min (ref >60)
GFR calc non Af Amer: >60 mL/min (ref >60)
Glucose, Bld: 108 mg/dL — ABNORMAL HIGH (ref 65–99)
Potassium: 3.8 mmol/L (ref 3.5–5.1)
SODIUM: 139 mmol/L (ref 135–145)
Total Bilirubin: 0.7 mg/dL (ref 0.3–1.2)
Total Protein: 8.2 g/dL — ABNORMAL HIGH (ref 6.5–8.1)

## 2017-08-26 LAB — CBC
HCT: 45.5 % (ref 39.0–52.0)
HEMOGLOBIN: 15.9 g/dL (ref 13.0–17.0)
MCH: 27.6 pg (ref 26.0–34.0)
MCHC: 34.9 g/dL (ref 30.0–36.0)
MCV: 78.9 fL (ref 78.0–100.0)
Platelets: 279 10*3/uL (ref 150–400)
RBC: 5.77 MIL/uL (ref 4.22–5.81)
RDW: 14.4 % (ref 11.5–15.5)
WBC: 8.8 10*3/uL (ref 4.0–10.5)

## 2017-08-26 LAB — ACETAMINOPHEN LEVEL: Acetaminophen (Tylenol), Serum: <10 ug/mL — ABNORMAL LOW (ref 10–30)

## 2017-08-26 LAB — ETHANOL: Alcohol, Ethyl (B): 300 mg/dL — ABNORMAL HIGH (ref <10)

## 2017-08-26 LAB — SALICYLATE LEVEL

## 2017-08-26 MED ORDER — VITAMIN B-1 100 MG PO TABS
100.0000 mg | ORAL_TABLET | Freq: Once | ORAL | Status: AC
  Administered 2017-08-26: 100 mg via ORAL
  Filled 2017-08-26: qty 1

## 2017-08-26 MED ORDER — CHLORDIAZEPOXIDE HCL 25 MG PO CAPS: ORAL_CAPSULE | ORAL | 0 refills | Status: DC

## 2017-08-26 NOTE — ED Triage Notes (Signed)
States wants to stop drinking took last drink this morning states has been to rehab about 13 times for same alert and oriented x 3. No respiratory or acute distress noted.

## 2017-08-26 NOTE — ED Provider Notes (Signed)
[facility]-EMERGENCY DEPT Provider Note   CSN: [id number] Arrival date & time: 08/26/17  1840     History   Chief Complaint Chief Complaint  Patient presents with  . Alcohol Problem    HPI [name] is a 46 y.o. male.  [name] is a 46 y.o. Male who presents to the ED complaining of alcohol withdrawal.  With binge drinking alcohol and was sober as of 4 days ago.  He reports he has been on a binge of alcohol drinking for the past 3 days.  He tells me he drank at least 24 cans of [employer] lite yesterday and about 12 cans of Miller lite this morning.  He tells me he feels jittery and shaky like he is going through alcohol withdrawal.  He tells me he has had seizures in the past about 6 years ago that was related to alcohol withdrawal.  He also reports having bad DTs.  He was seen and admitted recently to the the hospital less than a month ago.  He tells me after discharge she did not go to rehab, but was sober.  He tells me he is here today because of his ex-wife and son.  He does not want to be here today but came here due to the persistence of his family.  He denies any suicidal homicidal ideation no recent seizures.  He denies any physical complaints.  He denies abdominal pain, nausea, vomiting, diarrhea, rashes, chest pain, shortness of breath.    The history is provided by the patient, medical records and a significant other. No language interpreter was used.  Alcohol Problem  Pertinent negatives include no chest pain, no abdominal pain, no headaches and no shortness of breath.    Past Medical History:  Diagnosis Date  . Active smoker   . Alcohol abuse   . Anxiety   . Bipolar 2 disorder (HCC)   . Depression     Patient Active Problem List   Diagnosis Date Noted  . Tobacco abuse   . Delirium tremens (HCC) 03/02/2017  . GI bleed 10/06/2013  . Marijuana abuse 10/06/2013  . Alcohol withdrawal (HCC) 10/05/2013  . Suicidal ideation  10/05/2013  . Alcohol abuse 10/26/2011  . Alcohol withdrawal syndrome (HCC) 10/26/2011  . Depression 10/26/2011    Past Surgical History:  Procedure Laterality Date  . LYMPH NODE DISSECTION         Home Medications    Prior to Admission medications   Medication Sig Start Date End Date Taking? Authorizing Provider  chlordiazePOXIDE (LIBRIUM) 25 MG capsule 50mg  PO TID x 1D, then 25-50mg  PO BID X 1D, then 25-50mg  PO QD X 1D 08/26/17   Everlene Farrieransie, Raliyah Montella, PA-C  folic acid (FOLVITE) 1 MG tablet Take 1 tablet (1 mg total) by mouth daily. Patient not taking: Reported on 08/26/2017 07/08/17   Vassie LollMadera, Carlos, MD  Multiple Vitamin (MULTIVITAMIN WITH MINERALS) TABS tablet Take 1 tablet by mouth daily. Patient not taking: Reported on 07/07/2017 04/29/17   Glade LloydAlekh, Kshitiz, MD  nicotine (NICODERM CQ - DOSED IN MG/24 HOURS) 21 mg/24hr patch Place 1 patch (21 mg total) onto the skin daily. Patient not taking: Reported on 08/26/2017 07/09/17   Vassie LollMadera, Carlos, MD  thiamine 100 MG tablet Take 1 tablet (100 mg total) by mouth daily. Patient not taking: Reported on 08/26/2017 07/09/17   Vassie LollMadera, Carlos, MD    Family History Family History  Problem Relation Age of Onset  . Alcohol abuse Father  Social History Social History   Tobacco Use  . Smoking status: Current Every Day Smoker    Packs/day: 2.00    Years: 20.00    Pack years: 40.00    Types: Cigarettes  . Smokeless tobacco: Never Used  Substance Use Topics  . Alcohol use: Yes    Alcohol/week: 151.2 oz    Types: 252 Cans of beer per week    Comment: Last: 6am this morning;   . Drug use: Yes    Types: Marijuana    Comment: Last used: This morning      Allergies   Paxil [paroxetine hcl]   Review of Systems Review of Systems  Constitutional: Negative for chills and fever.  HENT: Negative for congestion and sore throat.   Eyes: Negative for visual disturbance.  Respiratory: Negative for cough and shortness of breath.     Cardiovascular: Negative for chest pain.  Gastrointestinal: Negative for abdominal pain, diarrhea, nausea and vomiting.  Genitourinary: Negative for dysuria.  Musculoskeletal: Negative for back pain and neck pain.  Skin: Negative for rash.  Neurological: Negative for seizures, syncope and headaches.  Psychiatric/Behavioral: Negative for dysphoric mood, hallucinations, sleep disturbance and suicidal ideas. The patient is nervous/anxious.      Physical Exam Updated Vital Signs BP 128/81   Pulse 99   Temp 98.3 F (36.8 C) (Oral)   Resp 20   Ht 5\' 10"  (1.778 m)   Wt 68 kg (150 lb)   SpO2 98%   BMI 21.52 kg/m   Physical Exam  Constitutional: He is oriented to person, place, and time. He appears well-developed and well-nourished. No distress.  Nontoxic appearing.  HENT:  Head: Normocephalic and atraumatic.  Mouth/Throat: Oropharynx is clear and moist.  No tongue fasciculations noted.  Eyes: Conjunctivae and EOM are normal. Pupils are equal, round, and reactive to light. Right eye exhibits no discharge. Left eye exhibits no discharge.  Neck: Neck supple.  Cardiovascular: Normal rate, regular rhythm, normal heart sounds and intact distal pulses.  Pulmonary/Chest: Effort normal and breath sounds normal. No stridor. No respiratory distress. He has no wheezes.  Abdominal: Soft. There is no tenderness. There is no guarding.  Musculoskeletal: He exhibits no edema.  Lymphadenopathy:    He has no cervical adenopathy.  Neurological: He is alert and oriented to person, place, and time. He exhibits normal muscle tone. Coordination normal.  Skin: Skin is warm and dry. Capillary refill takes less than 2 seconds. No rash noted. He is not diaphoretic. No erythema. No pallor.  Psychiatric: His behavior is normal. His mood appears anxious. His speech is not rapid and/or pressured and not slurred. He does not exhibit a depressed mood. He expresses no homicidal and no suicidal ideation.  Appears  slightly anxious. He denies SI or HI.   Nursing note and vitals reviewed.    ED Treatments / Results  Labs (all labs ordered are listed, but only abnormal results are displayed) Labs Reviewed  COMPREHENSIVE METABOLIC PANEL - Abnormal; Notable for the following components:      Result Value   Glucose, Bld 108 (*)    Total Protein 8.2 (*)    All other components within normal limits  ETHANOL - Abnormal; Notable for the following components:   Alcohol, Ethyl (B) 300 (*)    All other components within normal limits  ACETAMINOPHEN LEVEL - Abnormal; Notable for the following components:   Acetaminophen (Tylenol), Serum <10 (*)    All other components within normal limits  SALICYLATE LEVEL  CBC  RAPID URINE DRUG SCREEN, HOSP PERFORMED    EKG  EKG Interpretation None       Radiology No results found.  Procedures Procedures (including critical care time)  Medications Ordered in ED Medications  thiamine (VITAMIN B-1) tablet 100 mg (100 mg Oral Given 08/26/17 2139)     Initial Impression / Assessment and Plan / ED Course  I have reviewed the triage vital signs and the nursing notes.  Pertinent labs & imaging results that were available during my care of the patient were reviewed by me and considered in my medical decision making (see chart for details).    This  is a 46 y.o. Male who presents to the ED complaining of alcohol withdrawal.  With binge drinking alcohol and was sober as of 4 days ago.  He reports he has been on a binge of alcohol drinking for the past 3 days.  He tells me he drank at least 24 cans of [employer] yesterday and about 12 cans of Miller lite this morning.  He tells me he feels jittery and shaky like he is going through alcohol withdrawal.  He tells me he has had seizures in the past about 6 years ago that was related to alcohol withdrawal.  He also reports having bad DTs.  He was seen and admitted recently to the the hospital less than a month ago.  He  tells me after discharge she did not go to rehab, but was sober.  He tells me he is here today because of his ex-wife and son.  He does not want to be here today but came here due to the persistence of his family.  He denies any suicidal homicidal ideation no recent seizures.  He denies any physical complaints.  On exam the patient is afebrile nontoxic-appearing.  Lungs are clear to auscultation bilaterally.  His abdomen is soft nontender.  He has no tongue fasciculations.  He denies suicidal or homicidal ideations.  His speech is clear and coherent. Blood work was ordered by [employer] which shows an alcohol level of 300.  The patient tells me he was sober as of 4 days ago.  He clinically does not appear to be in withdrawal currently.  As he has recently been sober I have a low suspicion for the patient having withdrawal seizures or severe DTs that would require admission at this time. Patient needs rehab. Will still provide with librium taper due to his history. I divided him with outpatient resources and encouraged him to follow-up on this information.  I discussed precautions and using a Librium taper and alcohol abuse.  I discussed strict and specific return precautions. I advised the patient to follow-up with their primary care provider this week. I advised the patient to return to the emergency department with new or worsening symptoms or new concerns. The patient verbalized understanding and agreement with plan.   This patient was discussed with Dr. Criss Alvine who agrees with assessment and plan.   Final Clinical Impressions(s) / ED Diagnoses   Final diagnoses:  Alcohol abuse  Alcoholic intoxication without complication [facility])    ED Discharge Orders        Ordered    chlordiazePOXIDE (LIBRIUM) 25 MG capsule     08/26/17 2201       Everlene Farrier, PA-C 08/26/17 2205    Pricilla Loveless, MD 08/27/17 [phone]

## 2017-08-26 NOTE — ED Notes (Signed)
Bed: WA07 Expected date:  Expected time:  Means of arrival:  Comments: Triage 3 

## 2017-08-30 ENCOUNTER — Inpatient Hospital Stay (HOSPITAL_COMMUNITY)
Admission: EM | Admit: 2017-08-30 | Discharge: 2017-09-02 | DRG: 897 | Disposition: A | Discharge status: Home / Self Care | Payer: BLUE CROSS/BLUE SHIELD | Attending: Internal Medicine | Admitting: Internal Medicine

## 2017-08-30 ENCOUNTER — Emergency Department (HOSPITAL_COMMUNITY): Payer: BLUE CROSS/BLUE SHIELD

## 2017-08-30 ENCOUNTER — Other Ambulatory Visit: Payer: Self-pay

## 2017-08-30 ENCOUNTER — Emergency Department (HOSPITAL_COMMUNITY)
Admission: EM | Admit: 2017-08-30 | Discharge: 2017-08-30 | Disposition: A | Discharge status: Home / Self Care | Payer: BLUE CROSS/BLUE SHIELD | Attending: Emergency Medicine | Admitting: Emergency Medicine

## 2017-08-30 ENCOUNTER — Encounter (HOSPITAL_COMMUNITY): Payer: Self-pay

## 2017-08-30 ENCOUNTER — Encounter (HOSPITAL_COMMUNITY): Payer: Self-pay | Admitting: Emergency Medicine

## 2017-08-30 DIAGNOSIS — E876 Hypokalemia: Secondary | ICD-10-CM | POA: Diagnosis present

## 2017-08-30 DIAGNOSIS — F1010 Alcohol abuse, uncomplicated: Secondary | ICD-10-CM | POA: Diagnosis not present

## 2017-08-30 DIAGNOSIS — F3181 Bipolar II disorder: Secondary | ICD-10-CM | POA: Diagnosis present

## 2017-08-30 DIAGNOSIS — Z811 Family history of alcohol abuse and dependence: Secondary | ICD-10-CM

## 2017-08-30 DIAGNOSIS — R45851 Suicidal ideations: Secondary | ICD-10-CM | POA: Insufficient documentation

## 2017-08-30 DIAGNOSIS — R569 Unspecified convulsions: Secondary | ICD-10-CM

## 2017-08-30 DIAGNOSIS — F10231 Alcohol dependence with withdrawal delirium: Secondary | ICD-10-CM | POA: Diagnosis not present

## 2017-08-30 DIAGNOSIS — F10239 Alcohol dependence with withdrawal, unspecified: Secondary | ICD-10-CM

## 2017-08-30 DIAGNOSIS — F10920 Alcohol use, unspecified with intoxication, uncomplicated: Secondary | ICD-10-CM | POA: Diagnosis present

## 2017-08-30 DIAGNOSIS — F4310 Post-traumatic stress disorder, unspecified: Secondary | ICD-10-CM | POA: Diagnosis present

## 2017-08-30 DIAGNOSIS — Z888 Allergy status to other drugs, medicaments and biological substances status: Secondary | ICD-10-CM

## 2017-08-30 DIAGNOSIS — Z79899 Other long term (current) drug therapy: Secondary | ICD-10-CM

## 2017-08-30 DIAGNOSIS — F17210 Nicotine dependence, cigarettes, uncomplicated: Secondary | ICD-10-CM | POA: Diagnosis present

## 2017-08-30 DIAGNOSIS — F10939 Alcohol use, unspecified with withdrawal, unspecified: Secondary | ICD-10-CM

## 2017-08-30 LAB — COMPREHENSIVE METABOLIC PANEL
ALT: 38 U/L (ref 17–63)
ALT: 33 U/L (ref 17–63)
AST: 51 U/L — ABNORMAL HIGH (ref 15–41)
AST: 50 U/L — ABNORMAL HIGH (ref 15–41)
Albumin: 4.1 g/dL (ref 3.5–5.0)
Albumin: 3.9 g/dL (ref 3.5–5.0)
Alkaline Phosphatase: 53 U/L (ref 38–126)
Alkaline Phosphatase: 43 U/L (ref 38–126)
Anion gap: 11 (ref 5–15)
Anion gap: 12 (ref 5–15)
BUN: 6 mg/dL (ref 6–20)
BUN: 11 mg/dL (ref 6–20)
CHLORIDE: 100 mmol/L — AB (ref 101–111)
CO2: 25 mmol/L (ref 22–32)
CO2: 26 mmol/L (ref 22–32)
Calcium: 8.4 mg/dL — ABNORMAL LOW (ref 8.9–10.3)
Calcium: 8 mg/dL — ABNORMAL LOW (ref 8.9–10.3)
Chloride: 104 mmol/L (ref 101–111)
Creatinine, Ser: 0.73 mg/dL (ref 0.61–1.24)
Creatinine, Ser: 0.61 mg/dL (ref 0.61–1.24)
GFR calc Af Amer: >60 mL/min (ref >60)
GFR calc Af Amer: >60 mL/min (ref >60)
GFR calc non Af Amer: >60 mL/min (ref >60)
GLUCOSE: 98 mg/dL (ref 65–99)
Glucose, Bld: 159 mg/dL — ABNORMAL HIGH (ref 65–99)
POTASSIUM: 3.4 mmol/L — AB (ref 3.5–5.1)
Potassium: 3.4 mmol/L — ABNORMAL LOW (ref 3.5–5.1)
Sodium: 140 mmol/L (ref 135–145)
Sodium: 138 mmol/L (ref 135–145)
Total Bilirubin: 0.5 mg/dL (ref 0.3–1.2)
Total Bilirubin: 0.9 mg/dL (ref 0.3–1.2)
Total Protein: 7.2 g/dL (ref 6.5–8.1)
Total Protein: 6.8 g/dL (ref 6.5–8.1)

## 2017-08-30 LAB — RAPID URINE DRUG SCREEN, HOSP PERFORMED
Amphetamines: NOT DETECTED
Amphetamines: NOT DETECTED
BENZODIAZEPINES: POSITIVE — AB
Barbiturates: NOT DETECTED
Barbiturates: NOT DETECTED
Benzodiazepines: NOT DETECTED
COCAINE: NOT DETECTED
Cocaine: NOT DETECTED
Opiates: NOT DETECTED
Opiates: NOT DETECTED
Tetrahydrocannabinol: NOT DETECTED
Tetrahydrocannabinol: POSITIVE — AB

## 2017-08-30 LAB — CBC
HCT: 43.7 % (ref 39.0–52.0)
Hemoglobin: 14.9 g/dL (ref 13.0–17.0)
MCH: 27.4 pg (ref 26.0–34.0)
MCHC: 34.1 g/dL (ref 30.0–36.0)
MCV: 80.3 fL (ref 78.0–100.0)
Platelets: 200 10*3/uL (ref 150–400)
RBC: 5.44 MIL/uL (ref 4.22–5.81)
RDW: 14.7 % (ref 11.5–15.5)
WBC: 7.1 10*3/uL (ref 4.0–10.5)

## 2017-08-30 LAB — CBC WITH DIFFERENTIAL/PLATELET
Basophils Absolute: 0 10*3/uL (ref 0.0–0.1)
Basophils Relative: 0 %
EOS PCT: 0 %
Eosinophils Absolute: 0 10*3/uL (ref 0.0–0.7)
HCT: 37.2 % — ABNORMAL LOW (ref 39.0–52.0)
Hemoglobin: 12.7 g/dL — ABNORMAL LOW (ref 13.0–17.0)
LYMPHS ABS: 2.4 10*3/uL (ref 0.7–4.0)
LYMPHS PCT: 22 %
MCH: 27.1 pg (ref 26.0–34.0)
MCHC: 34.1 g/dL (ref 30.0–36.0)
MCV: 79.5 fL (ref 78.0–100.0)
MONO ABS: 0.6 10*3/uL (ref 0.1–1.0)
MONOS PCT: 6 %
Neutro Abs: 7.5 10*3/uL (ref 1.7–7.7)
Neutrophils Relative %: 72 %
PLATELETS: 184 10*3/uL (ref 150–400)
RBC: 4.68 MIL/uL (ref 4.22–5.81)
RDW: 14.7 % (ref 11.5–15.5)
WBC: 10.5 10*3/uL (ref 4.0–10.5)

## 2017-08-30 LAB — ETHANOL
ALCOHOL ETHYL (B): 168 mg/dL — AB (ref <10)
Alcohol, Ethyl (B): 389 mg/dL (ref <10)

## 2017-08-30 MED ORDER — LORAZEPAM 2 MG/ML IJ SOLN
2.0000 mg | Freq: Once | INTRAMUSCULAR | Status: AC
  Administered 2017-08-30: 2 mg via INTRAVENOUS
  Filled 2017-08-30: qty 1

## 2017-08-30 MED ORDER — LORAZEPAM 1 MG PO TABS: 0.0000 mg | ORAL_TABLET | Freq: Two times a day (BID) | ORAL | Status: DC

## 2017-08-30 MED ORDER — MAGNESIUM SULFATE 2 GM/50ML IV SOLN
2.0000 g | Freq: Once | INTRAVENOUS | Status: AC
  Administered 2017-08-31: 2 g via INTRAVENOUS
  Filled 2017-08-30: qty 50

## 2017-08-30 MED ORDER — ADULT MULTIVITAMIN W/MINERALS CH
1.0000 | ORAL_TABLET | Freq: Every day | ORAL | Status: DC
  Administered 2017-08-31 – 2017-09-02 (×3): 1 via ORAL
  Filled 2017-08-30 (×3): qty 1

## 2017-08-30 MED ORDER — FOLIC ACID 1 MG PO TABS
1.0000 mg | ORAL_TABLET | Freq: Every day | ORAL | Status: DC
  Administered 2017-08-31 – 2017-09-02 (×3): 1 mg via ORAL
  Filled 2017-08-30 (×3): qty 1

## 2017-08-30 MED ORDER — LORAZEPAM 2 MG/ML IJ SOLN
2.0000 mg | INTRAMUSCULAR | Status: DC | PRN
  Administered 2017-08-31 (×2): 2 mg via INTRAVENOUS
  Administered 2017-08-31: 3 mg via INTRAVENOUS
  Administered 2017-08-31 (×10): 2 mg via INTRAVENOUS
  Filled 2017-08-30 (×4): qty 1
  Filled 2017-08-30: qty 2
  Filled 2017-08-30 (×9): qty 1

## 2017-08-30 MED ORDER — SODIUM CHLORIDE 0.9 % IV SOLN
INTRAVENOUS | Status: DC
  Administered 2017-08-30: 23:00:00 via INTRAVENOUS

## 2017-08-30 MED ORDER — LORAZEPAM 1 MG PO TABS: 0.0000 mg | ORAL_TABLET | Freq: Four times a day (QID) | ORAL | Status: DC

## 2017-08-30 MED ORDER — THIAMINE HCL 100 MG/ML IJ SOLN
100.0000 mg | Freq: Every day | INTRAMUSCULAR | Status: DC
  Administered 2017-08-30: 100 mg via INTRAVENOUS
  Filled 2017-08-30: qty 2

## 2017-08-30 MED ORDER — THIAMINE HCL 100 MG/ML IJ SOLN
Freq: Once | INTRAVENOUS | Status: AC
  Administered 2017-08-31: 01:00:00 via INTRAVENOUS
  Filled 2017-08-30: qty 1000

## 2017-08-30 MED ORDER — VITAMIN B-1 100 MG PO TABS: 100.0000 mg | ORAL_TABLET | Freq: Every day | ORAL | Status: DC

## 2017-08-30 MED ORDER — SODIUM CHLORIDE 0.9 % IV BOLUS (SEPSIS)
1000.0000 mL | Freq: Once | INTRAVENOUS | Status: AC
  Administered 2017-08-30: 1000 mL via INTRAVENOUS

## 2017-08-30 MED ORDER — VITAMIN B-1 100 MG PO TABS
100.0000 mg | ORAL_TABLET | Freq: Every day | ORAL | Status: DC
  Administered 2017-08-31 – 2017-09-02 (×3): 100 mg via ORAL
  Filled 2017-08-30 (×3): qty 1

## 2017-08-30 MED ORDER — LORAZEPAM 2 MG/ML IJ SOLN: 0.0000 mg | Freq: Two times a day (BID) | INTRAMUSCULAR | Status: DC

## 2017-08-30 MED ORDER — FAMOTIDINE IN NACL 20-0.9 MG/50ML-% IV SOLN
20.0000 mg | Freq: Two times a day (BID) | INTRAVENOUS | Status: DC
  Administered 2017-08-31 – 2017-09-02 (×6): 20 mg via INTRAVENOUS
  Filled 2017-08-30 (×6): qty 50

## 2017-08-30 MED ORDER — HYDROCODONE-ACETAMINOPHEN 5-325 MG PO TABS
1.0000 | ORAL_TABLET | ORAL | Status: DC | PRN
  Administered 2017-08-31: 1 via ORAL
  Administered 2017-08-31 – 2017-09-02 (×4): 2 via ORAL
  Filled 2017-08-30 (×5): qty 2

## 2017-08-30 MED ORDER — ENOXAPARIN SODIUM 40 MG/0.4ML ~~LOC~~ SOLN
40.0000 mg | Freq: Every day | SUBCUTANEOUS | Status: DC
  Filled 2017-08-30: qty 0.4

## 2017-08-30 MED ORDER — SODIUM CHLORIDE 0.9% FLUSH
3.0000 mL | Freq: Two times a day (BID) | INTRAVENOUS | Status: DC
  Administered 2017-08-31 – 2017-09-01 (×3): 3 mL via INTRAVENOUS

## 2017-08-30 MED ORDER — ACETAMINOPHEN 325 MG PO TABS
650.0000 mg | ORAL_TABLET | Freq: Four times a day (QID) | ORAL | Status: DC | PRN
  Administered 2017-08-31 – 2017-09-01 (×2): 650 mg via ORAL
  Filled 2017-08-30 (×2): qty 2

## 2017-08-30 MED ORDER — ACETAMINOPHEN 650 MG RE SUPP: 650.0000 mg | Freq: Four times a day (QID) | RECTAL | Status: DC | PRN

## 2017-08-30 MED ORDER — LORAZEPAM 2 MG/ML IJ SOLN: 0.0000 mg | Freq: Four times a day (QID) | INTRAMUSCULAR | Status: DC

## 2017-08-30 MED ORDER — NICOTINE 21 MG/24HR TD PT24
21.0000 mg | MEDICATED_PATCH | Freq: Every day | TRANSDERMAL | Status: DC
  Administered 2017-08-31 – 2017-09-02 (×5): 21 mg via TRANSDERMAL
  Filled 2017-08-30 (×5): qty 1

## 2017-08-30 MED ORDER — POTASSIUM CHLORIDE 10 MEQ/100ML IV SOLN
10.0000 meq | INTRAVENOUS | Status: AC
  Administered 2017-08-31 (×3): 10 meq via INTRAVENOUS
  Filled 2017-08-30 (×3): qty 100

## 2017-08-30 MED ORDER — LACTATED RINGERS IV BOLUS (SEPSIS)
1000.0000 mL | Freq: Once | INTRAVENOUS | Status: AC
  Administered 2017-08-30: 1000 mL via INTRAVENOUS

## 2017-08-30 NOTE — ED Triage Notes (Signed)
Pt arrived via [facility] due to DT. Pt seen earlier today for alcohol poisoning. Pt has tremors and contractors en route, given 2.5 of versed and bolus.

## 2017-08-30 NOTE — ED Notes (Signed)
Pt given a Gatorade to drink

## 2017-08-30 NOTE — ED Notes (Signed)
Blood is running in lab

## 2017-08-30 NOTE — H&P (Signed)
History and Physical    [name]:[id number] DOB: [birth year] DOA: 08/30/2017  PCP: Patient, No Pcp Per   Patient coming from: Home  Chief Complaint: Seizure-like activity   HPI: [name] is a 46 y.o. male with medical history significant for PTSD and severe alcohol dependence, now presenting to the emergency department after generalized seizure-like activity witnessed at home.  Patient reportedly drinks 48 beers every day, but had said that he decided to stop after a drink early this afternoon.  He was noted by a roommate to have a brief episode of generalized seizure-like activity and EMS was called.  Seizure activity had resolved by the time of EMS arrival, but there is concern for alcohol withdrawal and he was treated with Versed in the field.  He was also given 300 cc of normal saline and transported to the hospital.  Patient reports that he wants to quit alcohol.  ED Course: Upon arrival to the ED, patient is found to be afebrile, saturating low 90s on room air, slightly tachycardic, and with stable blood pressure.  EKG features a sinus tachycardia with rate 108.  Noncontrast head CT is a normal study.  Chemistry panel reveals a slight  hypokalemia with potassium of 3.4, and a slight elevation in AST to 50.  CBC is notable for a mild normocytic anemia with hemoglobin of 12.7.  UDS is positive for THC and benzodiazepines, and ethanol level is elevated to 168.  Patient was given a liter of normal saline and 2 mg of Ativan in the ED.  He remained hemodynamically stable, has not been in any apparent respiratory distress, and will be admitted to the stepdown unit for ongoing evaluation and management of seizure, suspected secondary to alcohol withdrawal.  Review of Systems:  All other systems reviewed and apart from HPI, are negative.  Past Medical History:  Diagnosis Date  . Active smoker   . Alcohol abuse   . Anxiety   . Bipolar 2 disorder (HCC)   . Depression      Past Surgical History:  Procedure Laterality Date  . LYMPH NODE DISSECTION       reports that he has been smoking cigarettes.  He has a 40.00 pack-year smoking history. he has never used smokeless tobacco. He reports that he drinks about 151.2 oz of alcohol per week. He reports that he uses drugs. Drug: Marijuana.  Allergies  Allergen Reactions  . Paxil [Paroxetine Hcl] Hives    Family History  Problem Relation Age of Onset  . Alcohol abuse Father      Prior to Admission medications   Medication Sig Start Date End Date Taking? Authorizing Provider  chlordiazePOXIDE (LIBRIUM) 25 MG capsule 50mg  PO TID x 1D, then 25-50mg  PO BID X 1D, then 25-50mg  PO QD X 1D 08/26/17  Yes Everlene Farrier, PA-C  thiamine 100 MG tablet Take 1 tablet (100 mg total) by mouth daily. 07/09/17  Yes Vassie Loll, MD  folic acid (FOLVITE) 1 MG tablet Take 1 tablet (1 mg total) by mouth daily. Patient not taking: Reported on 08/26/2017 07/08/17   Vassie Loll, MD  Multiple Vitamin (MULTIVITAMIN WITH MINERALS) TABS tablet Take 1 tablet by mouth daily. Patient not taking: Reported on 07/07/2017 04/29/17   Glade Lloyd, MD  nicotine (NICODERM CQ - DOSED IN MG/24 HOURS) 21 mg/24hr patch Place 1 patch (21 mg total) onto the skin daily. Patient not taking: Reported on 08/26/2017 07/09/17   Vassie Loll, MD    Physical Exam: Vitals:  08/30/17 2200 08/30/17 2200 08/30/17 2233 08/30/17 2300  BP: 114/61  136/73 123/70  Pulse: (!) 109  (!) 110 (!) 107  Resp: 17  19 18   Temp:      TempSrc:      SpO2: 97%  94% 92%  Weight:  77.1 kg (170 lb)    Height:  5\' 11"  (1.803 m)        Constitutional: NAD, somnolent, easily roused, chronically-ill appearing Eyes: PERTLA, lids and conjunctivae normal ENMT: Mucous membranes are moist. Posterior pharynx clear of any exudate or lesions.   Neck: normal, supple, no masses, no thyromegaly Respiratory: clear to auscultation bilaterally, no wheezing, no crackles.  Normal respiratory effort.   Cardiovascular: S1 & S2 heard, regular rate and rhythm. No extremity edema. No significant JVD. Abdomen: No distension, no tenderness, no masses palpated. Bowel sounds normal.  Musculoskeletal: no clubbing / cyanosis. No joint deformity upper and lower extremities.    Skin: no significant rashes, lesions, ulcers. Warm, dry, well-perfused. Neurologic: CN 2-12 grossly intact. Sensation intact, patellar DTR's normal. Strength 5/5 in all 4 limbs.  Psychiatric: Somnolent. Easily roused and oriented x 3.      Labs on Admission: I have personally reviewed following labs and imaging studies  CBC: Recent Labs  Lab 08/26/17 2019 08/30/17 1240 08/30/17 2117  WBC 8.8 7.1 10.5  NEUTROABS  --   --  7.5  HGB 15.9 14.9 12.7*  HCT 45.5 43.7 37.2*  MCV 78.9 80.3 79.5  PLT 279 200 184   Basic Metabolic Panel: Recent Labs  Lab 08/26/17 2019 08/30/17 1240 08/30/17 2117  NA 139 140 138  K 3.8 3.4* 3.4*  CL 103 104 100*  CO2 27 25 26   GLUCOSE 108* 159* 98  BUN 8 6 11   CREATININE 0.77 0.73 0.61  CALCIUM 9.0 8.4* 8.0*   GFR: Estimated Creatinine Clearance: 124.2 mL/min (by C-G formula based on SCr of 0.61 mg/dL). Liver Function Tests: Recent Labs  Lab 08/26/17 2019 08/30/17 1240 08/30/17 2117  AST 32 51* 50*  ALT 25 38 33  ALKPHOS 52 53 43  BILITOT 0.7 0.5 0.9  PROT 8.2* 7.2 6.8  ALBUMIN 4.5 4.1 3.9   No results for input(s): LIPASE, AMYLASE in the last 168 hours. No results for input(s): AMMONIA in the last 168 hours. Coagulation Profile: No results for input(s): INR, PROTIME in the last 168 hours. Cardiac Enzymes: No results for input(s): CKTOTAL, CKMB, CKMBINDEX, TROPONINI in the last 168 hours. BNP (last 3 results) No results for input(s): PROBNP in the last 8760 hours. HbA1C: No results for input(s): HGBA1C in the last 72 hours. CBG: No results for input(s): GLUCAP in the last 168 hours. Lipid Profile: No results for input(s): CHOL, HDL,  LDLCALC, TRIG, CHOLHDL, LDLDIRECT in the last 72 hours. Thyroid Function Tests: No results for input(s): TSH, T4TOTAL, FREET4, T3FREE, THYROIDAB in the last 72 hours. Anemia Panel: No results for input(s): VITAMINB12, FOLATE, FERRITIN, TIBC, IRON, RETICCTPCT in the last 72 hours. Urine analysis: No results found for: COLORURINE, APPEARANCEUR, LABSPEC, PHURINE, GLUCOSEU, HGBUR, BILIRUBINUR, KETONESUR, PROTEINUR, UROBILINOGEN, NITRITE, LEUKOCYTESUR Sepsis Labs: @LABRCNTIP (procalcitonin:4,lacticidven:4) )No results found for this or any previous visit (from the past 240 hour(s)).   Radiological Exams on Admission: Ct Head Wo Contrast  Result Date: 08/30/2017 CLINICAL DATA:  Seizure. EXAM: CT HEAD WITHOUT CONTRAST TECHNIQUE: Contiguous axial images were obtained from the base of the skull through the vertex without intravenous contrast. COMPARISON:  None. FINDINGS: Brain: No mass lesion, intraparenchymal hemorrhage or  extra-axial collection. No evidence of acute cortical infarct. Brain parenchyma and CSF-containing spaces are normal for age. Vascular: No hyperdense vessel or unexpected calcification. Skull: Normal visualized skull base, calvarium and extracranial soft tissues. Sinuses/Orbits: No sinus fluid levels or advanced mucosal thickening. No mastoid effusion. Normal orbits. IMPRESSION: Normal head CT. Electronically Signed   By: Deatra RobinsonKevin  Herman M.D.   On: 08/30/2017 23:08    EKG: Independently reviewed. Sinus tachycardia (rate 108).   Assessment/Plan  1. Alcohol withdrawal seizure; severe alcohol dependence  - Pt reports daily consumption of 48 beers, last drink in the early afternoon, then generalized seizure-like activity  - Treated with Versed by EMS with concern for withdrawal; no further seizure activity, no focal neuro deficit, CT head normal  - Suspect this was related to alcohol withdrawal  - Plan to continue seizure precautions, CIWA, Ativan, vitamin supplementation, social work  consult    2. Hypokalemia  - Potassium is 3.2 on admission  - Treated with 30 mEq IV potassium  - Continue cardiac monitoring and repeat chem panel in am     DVT prophylaxis: Lovenox Code Status: Full  Family Communication: Discussed with patient Disposition Plan: Admit to SDU Consults called: None Admission status: Inpatient    Briscoe Deutscherimothy S Opyd, MD [facility] Pager [phone]  If 7PM-7AM, please contact night-coverage [email or web address] Password [email or web address]  08/30/2017, 11:32 PM

## 2017-08-30 NOTE — ED Notes (Signed)
Pt removed his IV and decided he did not want to wait for discharge instructions. Pt had steady gait.

## 2017-08-30 NOTE — ED Provider Notes (Signed)
[facility]   CSN: [id number] Arrival date & time: 08/30/17  1219     History   Chief Complaint Chief Complaint  Patient presents with  . Alcohol Intoxication    HPI [name] is a 46 y.o. male.  HPI   46 year old male with alcohol intoxication.  Long history of alcohol abuse.    Reportedly told a roommate/friend that didn't want to live anymore.  Patient denies this to me although he is highly intoxicated and his reliability is questionable.  He denies any other ingestion.  Past Medical History:  Diagnosis Date  . Active smoker   . Alcohol abuse   . Anxiety   . Bipolar 2 disorder (HCC)   . Depression     Patient Active Problem List   Diagnosis Date Noted  . Tobacco abuse   . Delirium tremens (HCC) 03/02/2017  . GI bleed 10/06/2013  . Marijuana abuse 10/06/2013  . Alcohol withdrawal (HCC) 10/05/2013  . Suicidal ideation 10/05/2013  . Alcohol abuse 10/26/2011  . Alcohol withdrawal syndrome (HCC) 10/26/2011  . Depression 10/26/2011    Past Surgical History:  Procedure Laterality Date  . LYMPH NODE DISSECTION         Home Medications    Prior to Admission medications   Medication Sig Start Date End Date Taking? Authorizing Provider  chlordiazePOXIDE (LIBRIUM) 25 MG capsule 50mg  PO TID x 1D, then 25-50mg  PO BID X 1D, then 25-50mg  PO QD X 1D 08/26/17   Everlene Farrieransie, William, PA-C  folic acid (FOLVITE) 1 MG tablet Take 1 tablet (1 mg total) by mouth daily. Patient not taking: Reported on 08/26/2017 07/08/17   Vassie LollMadera, Carlos, MD  Multiple Vitamin (MULTIVITAMIN WITH MINERALS) TABS tablet Take 1 tablet by mouth daily. Patient not taking: Reported on 07/07/2017 04/29/17   Glade LloydAlekh, Kshitiz, MD  nicotine (NICODERM CQ - DOSED IN MG/24 HOURS) 21 mg/24hr patch Place 1 patch (21 mg total) onto the skin daily. Patient not taking: Reported on 08/26/2017 07/09/17   Vassie LollMadera, Carlos, MD  thiamine 100 MG tablet Take 1 tablet  (100 mg total) by mouth daily. Patient not taking: Reported on 08/26/2017 07/09/17   Vassie LollMadera, Carlos, MD    Family History Family History  Problem Relation Age of Onset  . Alcohol abuse Father     Social History Social History   Tobacco Use  . Smoking status: Current Every Day Smoker    Packs/day: 2.00    Years: 20.00    Pack years: 40.00    Types: Cigarettes  . Smokeless tobacco: Never Used  Substance Use Topics  . Alcohol use: Yes    Alcohol/week: 151.2 oz    Types: 252 Cans of beer per week    Comment: Last: 6am this morning;   . Drug use: Yes    Types: Marijuana    Comment: Last used: This morning      Allergies   Paxil [paroxetine hcl]   Review of Systems Review of Systems  All systems reviewed and negative, other than as noted in HPI.  Physical Exam Updated Vital Signs BP 127/73   Pulse 90   Temp 98 F (36.7 C)   Resp 13   Ht 5\' 11"  (1.803 m)   Wt 77.1 kg (170 lb)   SpO2 94%   BMI 23.71 kg/m   Physical Exam  Constitutional: He appears well-developed.  Laying in bed. Clinically intoxicated.  Disheveled appearance.  HENT:  Head: Normocephalic and atraumatic.  Eyes:  Conjunctivae are normal. Right eye exhibits no discharge. Left eye exhibits no discharge.  Neck: Neck supple.  Cardiovascular: Regular rhythm and normal heart sounds. Exam reveals no gallop and no friction rub.  No murmur heard. Tachycardic  Pulmonary/Chest: Effort normal and breath sounds normal. No respiratory distress.  Abdominal: Soft. He exhibits no distension. There is no tenderness.  Musculoskeletal: He exhibits no edema or tenderness.  Neurological:  Somewhat drowsy but opens eyes to voice.  Speech is slurred but understandable.  Follows simple commands.  No focal motor deficit.  Skin: Skin is warm and dry.  Nursing note and vitals reviewed.    ED Treatments / Results  Labs (all labs ordered are listed, but only abnormal results are displayed) Labs Reviewed    COMPREHENSIVE METABOLIC PANEL - Abnormal; Notable for the following components:      Result Value   Potassium 3.4 (*)    Glucose, Bld 159 (*)    Calcium 8.4 (*)    AST 51 (*)    All other components within normal limits  ETHANOL - Abnormal; Notable for the following components:   Alcohol, Ethyl (B) 389 (*)    All other components within normal limits  CBC  RAPID URINE DRUG SCREEN, HOSP PERFORMED    EKG  EKG Interpretation  Date/Time:  Sunday August 30 2017 12:25:51 EST Ventricular Rate:  107 PR Interval:    QRS Duration: 111 QT Interval:  356 QTC Calculation: 475 R Axis:   -30 Text Interpretation:  Sinus tachycardia Left axis deviation Low voltage, extremity and precordial leads Anteroseptal infarct, old Baseline wander in lead(s) V5 Confirmed by Bertine Schlottman ([id number]) on 08/30/2017 1:26:33 PM       Radiology No results found.  Procedures Procedures (including critical care time)  Medications Ordered in ED Medications  lactated ringers bolus 1,000 mL (1,000 mLs Intravenous New Bag/Given 08/30/17 1320)     Initial Impression / Assessment and Plan / ED Course  I have reviewed the triage vital signs and the nursing notes.  Pertinent labs & imaging results that were available during my care of the patient were reviewed by me and considered in my medical decision making (see chart for details).     46  year old male with alcohol intoxication.  Reportedly had passive SI.  Patient denies this to me although his history is currently unreliable.  He is clinically highly intoxicated.  Will observe until he is more sober to reassess.  Patient was observed in the emergency room for couple hours.  He says he is ready to go and removed his IV.  He is still drunk. His baseline is being drunk. He continues to deny SI.  Final Clinical Impressions(s) / ED Diagnoses   Final diagnoses:  Alcoholic intoxication without complication (HCC)  ETOH abuse    ED Discharge Orders     None       Raeford RazorKohut, [name], MD 09/01/17 1024

## 2017-08-30 NOTE — ED Triage Notes (Signed)
Per EMS: Called out to the house by roommate of Pt. Pt has been drinking all night and drank 2 beers in front of EMS.  Per roommate pt has been making claims of not wanting to live anymore.  Pt ex [employer] with PTSD. Pt was very excitable and sad with EMS.  Pt had to be coxed out of house and was given 5 of haldol.

## 2017-08-30 NOTE — ED Provider Notes (Signed)
[facility]-EMERGENCY DEPT Provider Note   CSN: [id number] Arrival date & time: 08/30/17  2104     History   Chief Complaint No chief complaint on file.   HPI [name] is a 46 y.o. male.  46 year old male presents after having a witnessed seizure just prior to arrival.  States that normally he drinks 48 beers a day but has not drink today.  States that he was trying to quit alcohol abruptly.  Denies any chronic benzodiazepine use.  No prior history of seizure disorder.  No recent illnesses.  Had a short witnessed generalized tonic-clonic seizure activity with slight postictal period.  No tongue injury or bladder dysfunction.  Denies any new weakness to his arms or legs.  No trauma from the seizure.  EMS called and blood sugar above 100 and he was given 2.5 mg of Versed due to concern that he would have another seizure.  Was transferred here for management      Past Medical History:  Diagnosis Date  . Active smoker   . Alcohol abuse   . Anxiety   . Bipolar 2 disorder (HCC)   . Depression     Patient Active Problem List   Diagnosis Date Noted  . Tobacco abuse   . Delirium tremens (HCC) 03/02/2017  . GI bleed 10/06/2013  . Marijuana abuse 10/06/2013  . Alcohol withdrawal (HCC) 10/05/2013  . Suicidal ideation 10/05/2013  . Alcohol abuse 10/26/2011  . Alcohol withdrawal syndrome (HCC) 10/26/2011  . Depression 10/26/2011    Past Surgical History:  Procedure Laterality Date  . LYMPH NODE DISSECTION         Home Medications    Prior to Admission medications   Medication Sig Start Date End Date Taking? Authorizing Provider  chlordiazePOXIDE (LIBRIUM) 25 MG capsule 50mg  PO TID x 1D, then 25-50mg  PO BID X 1D, then 25-50mg  PO QD X 1D 08/26/17   Everlene Farrieransie, William, PA-C  folic acid (FOLVITE) 1 MG tablet Take 1 tablet (1 mg total) by mouth daily. Patient not taking: Reported on 08/26/2017 07/08/17   Vassie LollMadera, Carlos, MD  Multiple Vitamin  (MULTIVITAMIN WITH MINERALS) TABS tablet Take 1 tablet by mouth daily. Patient not taking: Reported on 07/07/2017 04/29/17   Glade LloydAlekh, Kshitiz, MD  nicotine (NICODERM CQ - DOSED IN MG/24 HOURS) 21 mg/24hr patch Place 1 patch (21 mg total) onto the skin daily. Patient not taking: Reported on 08/26/2017 07/09/17   Vassie LollMadera, Carlos, MD  thiamine 100 MG tablet Take 1 tablet (100 mg total) by mouth daily. Patient not taking: Reported on 08/26/2017 07/09/17   Vassie LollMadera, Carlos, MD    Family History Family History  Problem Relation Age of Onset  . Alcohol abuse Father     Social History Social History   Tobacco Use  . Smoking status: Current Every Day Smoker    Packs/day: 2.00    Years: 20.00    Pack years: 40.00    Types: Cigarettes  . Smokeless tobacco: Never Used  Substance Use Topics  . Alcohol use: Yes    Alcohol/week: 151.2 oz    Types: 252 Cans of beer per week    Comment: Last: 6am this morning;   . Drug use: Yes    Types: Marijuana    Comment: Last used: This morning      Allergies   Paxil [paroxetine hcl]   Review of Systems Review of Systems  All other systems reviewed and are negative.    Physical Exam Updated Vital Signs  BP 129/73 (BP Location: Right Arm)   Pulse (!) 105   Temp 98.3 F (36.8 C) (Oral)   Resp 19   SpO2 95%   Physical Exam  Constitutional: He is oriented to person, place, and time. He appears well-developed and well-nourished.  Non-toxic appearance. No distress.  HENT:  Head: Normocephalic and atraumatic.  Eyes: Conjunctivae, EOM and lids are normal. Pupils are equal, round, and reactive to light.  Neck: Normal range of motion. Neck supple. No tracheal deviation present. No thyroid mass present.  Cardiovascular: Normal rate, regular rhythm and normal heart sounds. Exam reveals no gallop.  No murmur heard. Pulmonary/Chest: Effort normal and breath sounds normal. No stridor. No respiratory distress. He has no decreased breath sounds. He has no  wheezes. He has no rhonchi. He has no rales.  Abdominal: Soft. Normal appearance and bowel sounds are normal. He exhibits no distension. There is no tenderness. There is no rebound and no CVA tenderness.  Musculoskeletal: Normal range of motion. He exhibits no edema or tenderness.  Neurological: He is alert and oriented to person, place, and time. He has normal strength. No cranial nerve deficit or sensory deficit. GCS eye subscore is 4. GCS verbal subscore is 5. GCS motor subscore is 6.  Skin: Skin is warm and dry. No abrasion and no rash noted.  Psychiatric: He has a normal mood and affect. His speech is normal and behavior is normal.  Nursing note and vitals reviewed.    ED Treatments / Results  Labs (all labs ordered are listed, but only abnormal results are displayed) Labs Reviewed  ETHANOL  RAPID URINE DRUG SCREEN, HOSP PERFORMED  CBC WITH DIFFERENTIAL/PLATELET  COMPREHENSIVE METABOLIC PANEL    EKG  EKG Interpretation None       Radiology No results found.  Procedures Procedures (including critical care time)  Medications Ordered in ED Medications  0.9 %  sodium chloride infusion (not administered)  sodium chloride 0.9 % bolus 1,000 mL (not administered)  LORazepam (ATIVAN) injection 2 mg (not administered)  LORazepam (ATIVAN) injection 0-4 mg (not administered)    Or  LORazepam (ATIVAN) tablet 0-4 mg (not administered)  LORazepam (ATIVAN) injection 0-4 mg (not administered)    Or  LORazepam (ATIVAN) tablet 0-4 mg (not administered)  thiamine (VITAMIN B-1) tablet 100 mg (not administered)    Or  thiamine (B-1) injection 100 mg (not administered)     Initial Impression / Assessment and Plan / ED Course  I have reviewed the triage vital signs and the nursing notes.  Pertinent labs & imaging results that were available during my care of the patient were reviewed by me and considered in my medical decision making (see chart for details).    Patient given  Ativan 2 mg here.  No further seizure activity noted.  CT negative.  No signs of trauma.  Patient placed on alcohol withdrawal protocol and will be admitted by the hospitalist service  Final Clinical Impressions(s) / ED Diagnoses   Final diagnoses:  None    ED Discharge Orders    None       Lorre NickAllen, Lillyanna Glandon, MD 08/30/17 2325

## 2017-08-30 NOTE — ED Notes (Signed)
Pt verbalized understanding discharge instructions and denies any further needs or questions at this time. VS stable, ambulatory and steady gait.   

## 2017-08-30 NOTE — ED Notes (Signed)
Bed: RESB Expected date:  Expected time:  Means of arrival:  Comments: 46 yo M/Alcohol Withdrawl

## 2017-08-30 NOTE — ED Notes (Signed)
Pt left necklace at bedside.  Called pt on identified VM and LM to pick up.  Logged item into lost and found in the dirty utility room.

## 2017-08-31 DIAGNOSIS — F10239 Alcohol dependence with withdrawal, unspecified: Secondary | ICD-10-CM

## 2017-08-31 DIAGNOSIS — R569 Unspecified convulsions: Secondary | ICD-10-CM

## 2017-08-31 LAB — COMPREHENSIVE METABOLIC PANEL
ALBUMIN: 3.7 g/dL (ref 3.5–5.0)
ALK PHOS: 43 U/L (ref 38–126)
ALT: 33 U/L (ref 17–63)
AST: 49 U/L — ABNORMAL HIGH (ref 15–41)
Anion gap: 10 (ref 5–15)
BUN: 9 mg/dL (ref 6–20)
CALCIUM: 7.9 mg/dL — AB (ref 8.9–10.3)
CO2: 24 mmol/L (ref 22–32)
CREATININE: 0.54 mg/dL — AB (ref 0.61–1.24)
Chloride: 105 mmol/L (ref 101–111)
GFR calc non Af Amer: >60 mL/min (ref >60)
GLUCOSE: 68 mg/dL (ref 65–99)
Potassium: 3.7 mmol/L (ref 3.5–5.1)
SODIUM: 139 mmol/L (ref 135–145)
Total Bilirubin: 1.4 mg/dL — ABNORMAL HIGH (ref 0.3–1.2)
Total Protein: 6.5 g/dL (ref 6.5–8.1)

## 2017-08-31 LAB — CBC
HCT: 37.8 % — ABNORMAL LOW (ref 39.0–52.0)
Hemoglobin: 12.9 g/dL — ABNORMAL LOW (ref 13.0–17.0)
MCH: 27 pg (ref 26.0–34.0)
MCHC: 34.1 g/dL (ref 30.0–36.0)
MCV: 79.1 fL (ref 78.0–100.0)
PLATELETS: 177 10*3/uL (ref 150–400)
RBC: 4.78 MIL/uL (ref 4.22–5.81)
RDW: 14.5 % (ref 11.5–15.5)
WBC: 9.1 10*3/uL (ref 4.0–10.5)

## 2017-08-31 LAB — MRSA PCR SCREENING: MRSA BY PCR: NEGATIVE

## 2017-08-31 MED ORDER — MAGNESIUM SULFATE 2 GM/50ML IV SOLN
2.0000 g | Freq: Once | INTRAVENOUS | Status: AC
  Administered 2017-08-31: 2 g via INTRAVENOUS
  Filled 2017-08-31: qty 50

## 2017-08-31 MED ORDER — SODIUM CHLORIDE 0.9 % IV SOLN: INTRAVENOUS | Status: DC

## 2017-08-31 MED ORDER — METOPROLOL TARTRATE 5 MG/5ML IV SOLN
5.0000 mg | INTRAVENOUS | Status: DC | PRN
  Administered 2017-08-31: 5 mg via INTRAVENOUS
  Filled 2017-08-31: qty 5

## 2017-08-31 MED ORDER — INFLUENZA VAC SPLIT QUAD 0.5 ML IM SUSY: 0.5000 mL | PREFILLED_SYRINGE | INTRAMUSCULAR | Status: DC

## 2017-08-31 NOTE — Progress Notes (Signed)
Consult for assistance with substance abuse, CSW will f/u regarding resources and assistance. [phone]

## 2017-08-31 NOTE — Progress Notes (Signed)
PROGRESS NOTE    [name]  ZOX:[id number] DOB: 26-Apr-1971 DOA: 08/30/2017 PCP: Patient, No Pcp Per  Brief Narrative:46 y.o. male with medical history significant for PTSD and severe alcohol dependence, now presenting to the emergency department after generalized seizure-like activity witnessed at home.  Patient reportedly drinks 48 beers every day, but had said that he decided to stop after a drink early this afternoon.  He was noted by a roommate to have a brief episode of generalized seizure-like activity and EMS was called.  Seizure activity had resolved by the time of EMS arrival, but there is concern for alcohol withdrawal and he was treated with Versed in the field.  He was also given 300 cc of normal saline and transported to the hospital.  Patient reports that he wants to quit alcohol.  ED Course: Upon arrival to the ED, patient is found to be afebrile, saturating low 90s on room air, slightly tachycardic, and with stable blood pressure.  EKG features a sinus tachycardia with rate 108.  Noncontrast head CT is a normal study.  Chemistry panel reveals a slight  hypokalemia with potassium of 3.4, and a slight elevation in AST to 50.  CBC is notable for a mild normocytic anemia with hemoglobin of 12.7.  UDS is positive for THC and benzodiazepines, and ethanol level is elevated to 168.  Patient was given a liter of normal saline and 2 mg of Ativan in the ED.  He remained hemodynamically stable, has not been in any apparent respiratory distress, and will be admitted to the stepdown unit for ongoing evaluation and management of seizure, suspected secondary to alcohol withdrawal.      Assessment & Plan:   Principal Problem:   Seizure due to alcohol withdrawal (HCC) Active Problems:   Alcohol abuse   Alcohol withdrawal seizure (HCC)   1. Alcohol withdrawal seizure; severe alcohol dependence  - Pt reports daily consumption of 48 beers, last drink in the early afternoon, then  generalized seizure-like activity  - Treated with Versed by EMS with concern for withdrawal; no further seizure activity, no focal neuro deficit, CT head normal  - Suspect this was related to alcohol withdrawal  - Plan to continue seizure precautions, CIWA, Ativan, vitamin supplementation, social work consult Patient verbalized understanding of the above.      2. Hypokalemia  - Potassium is 3.2 on admission  - Treated with 30 mEq IV potassium  - Continue cardiac monitoring and repeat chem panel in am  k 3.7.check mag level in am.      DVT prophylaxis:lovenox Code Statusfull :Family Communication none Disposition Plan: tbd  Consultants: none  Procedures:none  Antimicrobials:none Subjective: Feels like he is almost ready to go home.  Objective:awake,alert.shaky.talking in full sentences.no reported seizure or seizure like activity. Vitals:   08/31/17 1000 08/31/17 1100 08/31/17 1200 08/31/17 1300  BP: (!) 153/81 (!) 155/78 (!) 156/69 (!) 142/114  Pulse: (!) 122 (!) 118 (!) 107 (!) 130  Resp: 18 15 19 17   Temp:   98.3 F (36.8 C)   TempSrc:   Oral   SpO2: 95% 95% 96% 96%  Weight:      Height:        Intake/Output Summary (Last 24 hours) at 08/31/2017 1419 Last data filed at 08/31/2017 1213 Gross per 24 hour  Intake 2060.83 ml  Output 1600 ml  Net 460.83 ml   Filed Weights   08/30/17 2200 08/31/17 0055  Weight: 77.1 kg (170 lb) 71.6 kg (157 lb  13.6 oz)    Examination:  General exam: Appears calm and comfortable  Respiratory system: Clear to auscultation. Respiratory effort normal. Cardiovascular system: S1 & S2 heard, RRR. No JVD, murmurs, rubs, gallops or clicks. No pedal edema. Gastrointestinal system: Abdomen is nondistended, soft and nontender. No organomegaly or masses felt. Normal bowel sounds heard. Central nervous system: Alert and oriented. No focal neurological deficits. Extremities: Symmetric 5 x 5 power. Skin: No rashes, lesions or  ulcers Psychiatry: Judgement and insight appear normal. Mood & affect appropriate.     Data Reviewed: I have personally reviewed following labs and imaging studies  CBC: Recent Labs  Lab 08/26/17 2019 08/30/17 1240 08/30/17 2117 08/31/17 0329  WBC 8.8 7.1 10.5 9.1  NEUTROABS  --   --  7.5  --   HGB 15.9 14.9 12.7* 12.9*  HCT 45.5 43.7 37.2* 37.8*  MCV 78.9 80.3 79.5 79.1  PLT 279 200 184 177   Basic Metabolic Panel: Recent Labs  Lab 08/26/17 2019 08/30/17 1240 08/30/17 2117 08/31/17 0329  NA 139 140 138 139  K 3.8 3.4* 3.4* 3.7  CL 103 104 100* 105  CO2 27 25 26 24   GLUCOSE 108* 159* 98 68  BUN 8 6 11 9   CREATININE 0.77 0.73 0.61 0.54*  CALCIUM 9.0 8.4* 8.0* 7.9*   GFR: Estimated Creatinine Clearance: 118.1 mL/min (A) (by C-G formula based on SCr of 0.54 mg/dL (L)). Liver Function Tests: Recent Labs  Lab 08/26/17 2019 08/30/17 1240 08/30/17 2117 08/31/17 0329  AST 32 51* 50* 49*  ALT 25 38 33 33  ALKPHOS 52 53 43 43  BILITOT 0.7 0.5 0.9 1.4*  PROT 8.2* 7.2 6.8 6.5  ALBUMIN 4.5 4.1 3.9 3.7   No results for input(s): LIPASE, AMYLASE in the last 168 hours. No results for input(s): AMMONIA in the last 168 hours. Coagulation Profile: No results for input(s): INR, PROTIME in the last 168 hours. Cardiac Enzymes: No results for input(s): CKTOTAL, CKMB, CKMBINDEX, TROPONINI in the last 168 hours. BNP (last 3 results) No results for input(s): PROBNP in the last 8760 hours. HbA1C: No results for input(s): HGBA1C in the last 72 hours. CBG: No results for input(s): GLUCAP in the last 168 hours. Lipid Profile: No results for input(s): CHOL, HDL, LDLCALC, TRIG, CHOLHDL, LDLDIRECT in the last 72 hours. Thyroid Function Tests: No results for input(s): TSH, T4TOTAL, FREET4, T3FREE, THYROIDAB in the last 72 hours. Anemia Panel: No results for input(s): VITAMINB12, FOLATE, FERRITIN, TIBC, IRON, RETICCTPCT in the last 72 hours. Sepsis Labs: No results for input(s):  PROCALCITON, LATICACIDVEN in the last 168 hours.  Recent Results (from the past 240 hour(s))  MRSA PCR Screening     Status: None   Collection Time: 08/31/17 12:48 AM  Result Value Ref Range Status   MRSA by PCR NEGATIVE NEGATIVE Final    Comment:        The GeneXpert MRSA Assay (FDA approved for NASAL specimens only), is one component of a comprehensive MRSA colonization surveillance program. It is not intended to diagnose MRSA infection nor to guide or monitor treatment for MRSA infections.          Radiology Studies: Ct Head Wo Contrast  Result Date: 08/30/2017 CLINICAL DATA:  Seizure. EXAM: CT HEAD WITHOUT CONTRAST TECHNIQUE: Contiguous axial images were obtained from the base of the skull through the vertex without intravenous contrast. COMPARISON:  None. FINDINGS: Brain: No mass lesion, intraparenchymal hemorrhage or extra-axial collection. No evidence of acute cortical infarct. Brain  parenchyma and CSF-containing spaces are normal for age. Vascular: No hyperdense vessel or unexpected calcification. Skull: Normal visualized skull base, calvarium and extracranial soft tissues. Sinuses/Orbits: No sinus fluid levels or advanced mucosal thickening. No mastoid effusion. Normal orbits. IMPRESSION: Normal head CT. Electronically Signed   By: Deatra Robinson M.D.   On: 08/30/2017 23:08        Scheduled Meds: . enoxaparin (LOVENOX) injection  40 mg Subcutaneous QHS  . folic acid  1 mg Oral Daily  . [START ON 09/01/2017] Influenza vac split quadrivalent PF  0.5 mL Intramuscular Tomorrow-1000  . multivitamin with minerals  1 tablet Oral Daily  . nicotine  21 mg Transdermal Daily  . sodium chloride flush  3 mL Intravenous Q12H  . thiamine  100 mg Oral Daily   Continuous Infusions: . sodium chloride    . famotidine (PEPCID) IV Stopped (08/31/17 1213)  . magnesium sulfate 1 - 4 g bolus IVPB 2 g (08/31/17 1418)     LOS: 1 day      Alwyn Ren, MD [facility]  If 7PM-7AM, please contact night-coverage [email or web address] Password [email or web address] 08/31/2017, 2:19 PM

## 2017-08-31 NOTE — Clinical Social Work Note (Addendum)
Clinical Social Work Assessment  Patient Details  Name: [name] MRN: [id number] Date of Birth: July 30, 1971  Date of referral:  08/31/17               Reason for consult:  Substance Use/ETOH Abuse                Permission sought to share information with:    Permission granted to share information::     Name::        Agency::     Relationship::     Contact Information:     Housing/Transportation Living arrangements for the past 2 months:  Single Family Home Source of Information:  Patient Patient Interpreter Needed:  None Criminal Activity/Legal Involvement Pertinent to Current Situation/Hospitalization:  No - Comment as needed Significant Relationships:  Friend, Significant Other Lives with:  Self Do you feel safe going back to the place where you live?  Yes Need for family participation in patient care:  No (Coment)  Care giving concerns:  Patient admitted for seizure due to alcohol withdrawal. He reports he is admitted to the hospital every other month for alcohol withdrawal.   Patient reports, "I have been stressed with life and things have been overwhelming(job, strained relationship w/ brother and girlfriend)." Patient states binging on alcohol as a way to cope with his stressors. He reports binge drinking for the past eight days.   Patient says every time he talks with his family they give him constructive criticism about "how his life should be" and it makes him feel depressed.  Patient reports going to several [place] meetings, behavioral health, residential and outpatient programs and "feeling like nothing is working." He states, "I have to want it."   Patient is concern he will not have a ride home because family will be working. He asked CSW for cab voucher at discharge.   Social Worker assessment / plan:  CSW met with patient with patient at bedside, explain role. Patient alert, oriented and agreeable to talk with CSW. CSW listended to patient concerns and offered  emotional support. CSW offered SA residential and outpatient resources. Patient report he will accept resources, but feels the treatment programs do not help.   Patient was accepting of the resources.   CSW  will inquire about cab voucher w/ leadership.   Plan: Provide SA outpatient/ residential   Employment status:  [employer] information:    PT Recommendations:  Not assessed at this time Information / Referral to community resources:  Residential Substance Abuse Treatment Options, Outpatient Substance Abuse Treatment Options  Patient/Family's Response to care:  No family at bedside, pt. responding to care. Patient appreciative of CSW services.   Patient/Family's Understanding of and Emotional Response to Diagnosis, Current Treatment, and Prognosis:  The patient is aware his alcohol use is affecting his health but he not ready to seek treatment.    Emotional Assessment Appearance:  Appears stated age Attitude/Demeanor/Rapport:    Affect (typically observed):  Appropriate, Accepting Orientation:  Oriented to [name], Oriented to Place, Oriented to  Time, Oriented to Situation Alcohol / Substance use:  Not Applicable Psych involvement (Current and /or in the community):  No (Comment)  Discharge Needs  Concerns to be addressed:  Substance Abuse Concerns Readmission within the last 30 days:  No Current discharge risk:  Substance Abuse Barriers to Discharge:  Active Substance Use   Lia Hopping, LCSW 08/31/2017, 1:52 PM

## 2017-09-01 LAB — COMPREHENSIVE METABOLIC PANEL
ALT: 44 U/L (ref 17–63)
ANION GAP: 7 (ref 5–15)
AST: 65 U/L — AB (ref 15–41)
Albumin: 3.9 g/dL (ref 3.5–5.0)
Alkaline Phosphatase: 50 U/L (ref 38–126)
BILIRUBIN TOTAL: 1.3 mg/dL — AB (ref 0.3–1.2)
BUN: 7 mg/dL (ref 6–20)
CHLORIDE: 103 mmol/L (ref 101–111)
CO2: 28 mmol/L (ref 22–32)
Calcium: 8.5 mg/dL — ABNORMAL LOW (ref 8.9–10.3)
Creatinine, Ser: 0.62 mg/dL (ref 0.61–1.24)
GFR calc Af Amer: >60 mL/min (ref >60)
Glucose, Bld: 82 mg/dL (ref 65–99)
POTASSIUM: 3.5 mmol/L (ref 3.5–5.1)
Sodium: 138 mmol/L (ref 135–145)
TOTAL PROTEIN: 6.8 g/dL (ref 6.5–8.1)

## 2017-09-01 LAB — CBC
HCT: 40.4 % (ref 39.0–52.0)
Hemoglobin: 13.6 g/dL (ref 13.0–17.0)
MCH: 26.9 pg (ref 26.0–34.0)
MCHC: 33.7 g/dL (ref 30.0–36.0)
MCV: 79.8 fL (ref 78.0–100.0)
PLATELETS: 145 10*3/uL — AB (ref 150–400)
RBC: 5.06 MIL/uL (ref 4.22–5.81)
RDW: 14.6 % (ref 11.5–15.5)
WBC: 5.8 10*3/uL (ref 4.0–10.5)

## 2017-09-01 LAB — MAGNESIUM: MAGNESIUM: 2.3 mg/dL (ref 1.7–2.4)

## 2017-09-01 MED ORDER — LORAZEPAM 2 MG/ML IJ SOLN
2.0000 mg | Freq: Once | INTRAMUSCULAR | Status: AC
  Administered 2017-09-01: 2 mg via INTRAVENOUS
  Filled 2017-09-01: qty 1

## 2017-09-01 MED ORDER — TRAZODONE HCL 50 MG PO TABS
50.0000 mg | ORAL_TABLET | Freq: Every evening | ORAL | Status: DC | PRN
  Administered 2017-09-01: 50 mg via ORAL
  Filled 2017-09-01: qty 1

## 2017-09-01 MED ORDER — LORAZEPAM 2 MG/ML IJ SOLN: 2.0000 mg | INTRAMUSCULAR | Status: DC | PRN

## 2017-09-01 MED ORDER — HALOPERIDOL LACTATE 5 MG/ML IJ SOLN
2.0000 mg | Freq: Once | INTRAMUSCULAR | Status: AC | PRN
  Administered 2017-09-01: 2 mg via INTRAVENOUS
  Filled 2017-09-01: qty 1

## 2017-09-01 MED ORDER — LORAZEPAM 2 MG/ML IJ SOLN
2.0000 mg | INTRAMUSCULAR | Status: DC | PRN
  Administered 2017-09-01: 2 mg via INTRAVENOUS
  Administered 2017-09-01 (×6): 4 mg via INTRAVENOUS
  Administered 2017-09-02: 2 mg via INTRAVENOUS
  Filled 2017-09-01: qty 1
  Filled 2017-09-01 (×5): qty 2
  Filled 2017-09-01 (×2): qty 1
  Filled 2017-09-01: qty 2

## 2017-09-01 NOTE — Progress Notes (Signed)
Bed alarm going off, patient out of bed, talking about leaving. Able to talk him into getting in bed and taking more medicine. He requested nic path. I applied to Left upper arm. I was unable to find another patch.

## 2017-09-01 NOTE — Progress Notes (Signed)
Date: September 01, 2017 Vir Whetstine, BSN, RN3, CCM  [phone] Chart and notes review for patient progress and needs. Will follow for case management and discharge needs. Next review date: [id number] 

## 2017-09-01 NOTE — Progress Notes (Signed)
Patient states "he really needs to go home". He has gotten out of bed multiple times. He said the clock was confusing him so I took it down. I gave him medicine for pain and agitation and 10 minutes later he got out of bed and put all his clothes on including his boots. I told him I would call his Doctor, I did and orders received. He keeps talking about "a ride by cab or a nurse could take him" home. I explained the doctors were not going to discharge him tonight. He said "his boots were made for walking". I told him if he left the police would pick him up and he might have to stay longer, so he agreed to get back in bed and try and rest till he could talk with doctors in the morning.

## 2017-09-01 NOTE — Progress Notes (Signed)
Patient was found at 0800 with clothes on attempting to leave the hospital. He was redirected back to bed. Personal clothes were removed and hospital gown put on. Patient was responsive to getting back in bed. He is asking about being discharged home today. He is wanting to be discharged today. He states "I feel better and this is how long I usually stay in the hospital to detox". Patient has the desire for outpatient cessation of alcohol. Will continue to monitor patient.

## 2017-09-01 NOTE — Progress Notes (Signed)
PROGRESS NOTE    Dennis ChurchMichael C [name]  OZH:[id number] DOB: 04/20/1971 DOA: 08/30/2017 PCP: Patient, No Pcp Per  Brief [id number] y.o.malewith medical history significant forPTSD and severe alcohol dependence, now presenting to the emergency department after generalized seizure-like activity witnessed at home. Patient reportedly drinks 48 beers every day, but had said that he decided to stop after a drink early this afternoon. He was noted by a roommate to have a brief episode ofgeneralized seizure-like activity and EMS was called. Seizure activity had resolved by the time of EMS arrival, but there is concern for alcohol withdrawal and he was treated with Versed in the field. He was also given 300 cc of normal saline and transported to the hospital. Patient reports that he wants toquit alcohol.  ED Course:Upon arrival to the ED, patient is found to be afebrile, saturating low 90s on room air, slightly tachycardic, and with stable blood pressure. EKG features a sinus tachycardia with rate 108. Noncontrast head CT is a normal study. Chemistry panel reveals a slight hypokalemia with potassium of 3.4, and a slight elevation in AST to 50. CBC is notable for a mild normocytic anemia with hemoglobin of 12.7. UDS is positive for THC and benzodiazepines, and ethanol level is elevated to 168. Patient was given a liter of normal saline and 2 mg of Ativan in the ED. He remained hemodynamically stable, has not been in any apparent respiratory distress, and will be admitted to the stepdown unit for ongoing evaluation and management of seizure, suspected secondary to alcohol withdrawal.     Assessment & Plan:   Principal Problem:   Seizure due to alcohol withdrawal (HCC) Active Problems:   Alcohol abuse   Alcohol withdrawal seizure (HCC) Alcohol withdrawal seizure Alcohol dependence Plan to continue C1 protocol.  Patient is requiring Ativan 4 mg so far today he has received 4 doses of  Ativan 4 mg.  Patient is adamant about going home today but I have explained to him that he is not in medically stable to be discharged today he agrees with the plan at this time.  No further seizure activity is noted. Hypokalemia hypomagnesemia repleted. Admit to stepdown DVT prophylaxis: Lovenox Code Status: Full code Family Communication: None Disposition Plan: PPD Consultants: None Procedures: None  Subjective:wants to go home.  Objective:patient resting/sleeping. Vitals:   09/01/17 1130 09/01/17 1200 09/01/17 1300 09/01/17 1400  BP:  (!) 152/92 (!) 111/59 107/64  Pulse:  (!) 105    Resp:   17 20  Temp: 98.6 F (37 C)     TempSrc: Axillary     SpO2:      Weight:      Height:        Intake/Output Summary (Last 24 hours) at 09/01/2017 1447 Last data filed at 09/01/2017 1057 Gross per 24 hour  Intake 503 ml  Output 2350 ml  Net -1847 ml   Filed Weights   08/30/17 2200 08/31/17 0055  Weight: 77.1 kg (170 lb) 71.6 kg (157 lb 13.6 oz)    Examination:  General exam: Appears calm and comfortable  Respiratory system: Clear to auscultation. Respiratory effort normal. Cardiovascular system: S1 & S2 heard, RRR. No JVD, murmurs, rubs, gallops or clicks. No pedal edema. Gastrointestinal system: Abdomen is nondistended, soft and nontender. No organomegaly or masses felt. Normal bowel sounds heard. Central nervous system: Alert and oriented. No focal neurological deficits. Extremities: Symmetric 5 x 5 power. Skin: No rashes, lesions or ulcers Psychiatry: Judgement and insight appear normal. Mood & affect  appropriate.     Data Reviewed: I have personally reviewed following labs and imaging studies  CBC: Recent Labs  Lab 08/26/17 2019 08/30/17 1240 08/30/17 2117 08/31/17 0329 09/01/17 0551  WBC 8.8 7.1 10.5 9.1 5.8  NEUTROABS  --   --  7.5  --   --   HGB 15.9 14.9 12.7* 12.9* 13.6  HCT 45.5 43.7 37.2* 37.8* 40.4  MCV 78.9 80.3 79.5 79.1 79.8  PLT 279 200 184 177  145*   Basic Metabolic Panel: Recent Labs  Lab 08/26/17 2019 08/30/17 1240 08/30/17 2117 08/31/17 0329 09/01/17 0551  NA 139 140 138 139 138  K 3.8 3.4* 3.4* 3.7 3.5  CL 103 104 100* 105 103  CO2 27 25 26 24 28   GLUCOSE 108* 159* 98 68 82  BUN 8 6 11 9 7   CREATININE 0.77 0.73 0.61 0.54* 0.62  CALCIUM 9.0 8.4* 8.0* 7.9* 8.5*  MG  --   --   --   --  2.3   GFR: Estimated Creatinine Clearance: 118.1 mL/min (by C-G formula based on SCr of 0.62 mg/dL). Liver Function Tests: Recent Labs  Lab 08/26/17 2019 08/30/17 1240 08/30/17 2117 08/31/17 0329 09/01/17 0551  AST 32 51* 50* 49* 65*  ALT 25 38 33 33 44  ALKPHOS 52 53 43 43 50  BILITOT 0.7 0.5 0.9 1.4* 1.3*  PROT 8.2* 7.2 6.8 6.5 6.8  ALBUMIN 4.5 4.1 3.9 3.7 3.9   No results for input(s): LIPASE, AMYLASE in the last 168 hours. No results for input(s): AMMONIA in the last 168 hours. Coagulation Profile: No results for input(s): INR, PROTIME in the last 168 hours. Cardiac Enzymes: No results for input(s): CKTOTAL, CKMB, CKMBINDEX, TROPONINI in the last 168 hours. BNP (last 3 results) No results for input(s): PROBNP in the last 8760 hours. HbA1C: No results for input(s): HGBA1C in the last 72 hours. CBG: No results for input(s): GLUCAP in the last 168 hours. Lipid Profile: No results for input(s): CHOL, HDL, LDLCALC, TRIG, CHOLHDL, LDLDIRECT in the last 72 hours. Thyroid Function Tests: No results for input(s): TSH, T4TOTAL, FREET4, T3FREE, THYROIDAB in the last 72 hours. Anemia Panel: No results for input(s): VITAMINB12, FOLATE, FERRITIN, TIBC, IRON, RETICCTPCT in the last 72 hours. Sepsis Labs: No results for input(s): PROCALCITON, LATICACIDVEN in the last 168 hours.  Recent Results (from the past 240 hour(s))  MRSA PCR Screening     Status: None   Collection Time: 08/31/17 12:48 AM  Result Value Ref Range Status   MRSA by PCR NEGATIVE NEGATIVE Final    Comment:        The GeneXpert MRSA Assay (FDA approved  for NASAL specimens only), is one component of a comprehensive MRSA colonization surveillance program. It is not intended to diagnose MRSA infection nor to guide or monitor treatment for MRSA infections.          Radiology Studies: Ct Head Wo Contrast  Result Date: 08/30/2017 CLINICAL DATA:  Seizure. EXAM: CT HEAD WITHOUT CONTRAST TECHNIQUE: Contiguous axial images were obtained from the base of the skull through the vertex without intravenous contrast. COMPARISON:  None. FINDINGS: Brain: No mass lesion, intraparenchymal hemorrhage or extra-axial collection. No evidence of acute cortical infarct. Brain parenchyma and CSF-containing spaces are normal for age. Vascular: No hyperdense vessel or unexpected calcification. Skull: Normal visualized skull base, calvarium and extracranial soft tissues. Sinuses/Orbits: No sinus fluid levels or advanced mucosal thickening. No mastoid effusion. Normal orbits. IMPRESSION: Normal head CT. Electronically Signed  By: Kevin  Herman M.D.   On: 08/30/2017 23:08Deatra Robinson        Scheduled Meds: . enoxaparin (LOVENOX) injection  40 mg Subcutaneous QHS  . folic acid  1 mg Oral Daily  . Influenza vac split quadrivalent PF  0.5 mL Intramuscular Tomorrow-1000  . multivitamin with minerals  1 tablet Oral Daily  . nicotine  21 mg Transdermal Daily  . sodium chloride flush  3 mL Intravenous Q12H  . thiamine  100 mg Oral Daily   Continuous Infusions: . sodium chloride    . famotidine (PEPCID) IV Stopped (09/01/17 1125)     LOS: 2 days      Dennis RenElizabeth G Aaro Meyers, MD  If 7PM-7AM, please contact night-coverage [email or web address] Password [email or web address] 09/01/2017, 2:47 PM

## 2017-09-01 NOTE — Progress Notes (Signed)
Patient out of bed at 0322 CIWA but talked with him and he went right back to bed after using urinal and ent to sleep

## 2017-09-02 LAB — COMPREHENSIVE METABOLIC PANEL
ALT: 47 U/L (ref 17–63)
AST: 54 U/L — ABNORMAL HIGH (ref 15–41)
Albumin: 3.9 g/dL (ref 3.5–5.0)
Alkaline Phosphatase: 58 U/L (ref 38–126)
Anion gap: 9 (ref 5–15)
BUN: 8 mg/dL (ref 6–20)
CHLORIDE: 105 mmol/L (ref 101–111)
CO2: 26 mmol/L (ref 22–32)
CREATININE: 0.67 mg/dL (ref 0.61–1.24)
Calcium: 9 mg/dL (ref 8.9–10.3)
Glucose, Bld: 89 mg/dL (ref 65–99)
Potassium: 3.6 mmol/L (ref 3.5–5.1)
Sodium: 140 mmol/L (ref 135–145)
TOTAL PROTEIN: 7.3 g/dL (ref 6.5–8.1)
Total Bilirubin: 1 mg/dL (ref 0.3–1.2)

## 2017-09-02 LAB — CBC
HCT: 43 % (ref 39.0–52.0)
Hemoglobin: 14.5 g/dL (ref 13.0–17.0)
MCH: 27.1 pg (ref 26.0–34.0)
MCHC: 33.7 g/dL (ref 30.0–36.0)
MCV: 80.4 fL (ref 78.0–100.0)
PLATELETS: 144 10*3/uL — AB (ref 150–400)
RBC: 5.35 MIL/uL (ref 4.22–5.81)
RDW: 14.4 % (ref 11.5–15.5)
WBC: 6.3 10*3/uL (ref 4.0–10.5)

## 2017-09-02 MED ORDER — CHLORDIAZEPOXIDE HCL 25 MG PO CAPS: 25.0000 mg | ORAL_CAPSULE | Freq: Four times a day (QID) | ORAL | Status: DC | PRN

## 2017-09-02 MED ORDER — CHLORDIAZEPOXIDE HCL 25 MG PO CAPS: 25.0000 mg | ORAL_CAPSULE | Freq: Every day | ORAL | Status: DC

## 2017-09-02 MED ORDER — THIAMINE HCL 100 MG/ML IJ SOLN: 100.0000 mg | Freq: Once | INTRAMUSCULAR | Status: DC

## 2017-09-02 MED ORDER — LOPERAMIDE HCL 2 MG PO CAPS: 2.0000 mg | ORAL_CAPSULE | ORAL | Status: DC | PRN

## 2017-09-02 MED ORDER — VITAMIN B-1 100 MG PO TABS: 100.0000 mg | ORAL_TABLET | Freq: Every day | ORAL | Status: DC

## 2017-09-02 MED ORDER — ONDANSETRON 4 MG PO TBDP: 4.0000 mg | ORAL_TABLET | Freq: Four times a day (QID) | ORAL | Status: DC | PRN

## 2017-09-02 MED ORDER — ADULT MULTIVITAMIN W/MINERALS CH: 1.0000 | ORAL_TABLET | Freq: Every day | ORAL | Status: DC

## 2017-09-02 MED ORDER — HYDROXYZINE HCL 25 MG PO TABS: 25.0000 mg | ORAL_TABLET | Freq: Four times a day (QID) | ORAL | Status: DC | PRN

## 2017-09-02 MED ORDER — CHLORDIAZEPOXIDE HCL 25 MG PO CAPS: 25.0000 mg | ORAL_CAPSULE | Freq: Two times a day (BID) | ORAL | 0 refills | Status: DC

## 2017-09-02 MED ORDER — CHLORDIAZEPOXIDE HCL 25 MG PO CAPS
25.0000 mg | ORAL_CAPSULE | Freq: Two times a day (BID) | ORAL | Status: DC
  Administered 2017-09-02: 25 mg via ORAL
  Filled 2017-09-02: qty 1

## 2017-09-02 NOTE — Progress Notes (Signed)
Patient has discharge orders to home. PIV discontinued. Discharge instructions given. Prescription for Librium given. Patient leaving with all his belongings.

## 2017-09-02 NOTE — Plan of Care (Signed)
  Progressing Education: Knowledge of General Education information will improve 09/02/2017 0951 - Progressing by Charlett LangoKotian, Kahlen Morais S, RN Health Behavior/Discharge Planning: Ability to manage health-related needs will improve 09/02/2017 0951 - Progressing by Charlett LangoKotian, Cruise Baumgardner S, RN Clinical Measurements: Will remain free from infection 09/02/2017 0951 - Progressing by Charlett LangoKotian, Clytie Shetley S, RN Diagnostic test results will improve 09/02/2017 0951 - Progressing by Charlett LangoKotian, Mickie Kozikowski S, RN Cardiovascular complication will be avoided 09/02/2017 0951 - Progressing by Charlett LangoKotian, Darneisha Windhorst S, RN Activity: Risk for activity intolerance will decrease 09/02/2017 0951 - Progressing by Charlett LangoKotian, Frederick Marro S, RN Coping: Level of anxiety will decrease 09/02/2017 0951 - Progressing by Charlett LangoKotian, Orby Tangen S, RN Pain Managment: General experience of comfort will improve 09/02/2017 0951 - Progressing by Charlett LangoKotian, Darianna Amy S, RN

## 2017-09-02 NOTE — Progress Notes (Signed)
   09/02/17 1300  Clinical Encounter Type  Visited With Patient  Visit Type Initial;Psychological support;Spiritual support  Referral From Nurse  Consult/Referral To Chaplain  Spiritual Encounters  Spiritual Needs Emotional;Other (Comment) (Spiritual Care conversation/Support )  Stress Factors  Patient Stress Factors Health changes;Major life changes;Other (Comment) (Alcohol treatment )   Spoke with the patient briefly as he was being discharged. The patient was tearful and afraid of the future. I let him know that he was not alone in this process and to always ask for help.   Chaplain Clint BolderBrittany Kalya Troeger M.Div.

## 2017-09-02 NOTE — Evaluation (Signed)
Physical Therapy Evaluation Patient Details Name: Dennis ChurchMichael C Lunney MRN: [id number] DOB: 1970-10-17 Today's Date: 09/02/2017   History of Present Illness  Pt admitted with seizures 2* ETOH withdrawal.  Pt with hx of bipolar  Clinical Impression  Pt admitted as above and currently demonstrating IND in all mobility tasks.  Pt demonstrating good balance including ability to back step, side step, stork stand and tandem walk without difficulty.  Pt with no PT needs at this time and PT service will sign off.    Follow Up Recommendations No PT follow up    Equipment Recommendations  None recommended by PT    Recommendations for Other Services       Precautions / Restrictions Restrictions Weight Bearing Restrictions: No      Mobility  Bed Mobility Overal bed mobility: Independent                Transfers Overall transfer level: Independent               General transfer comment: No physical assist sit<>stand  Ambulation/Gait Ambulation/Gait assistance: Independent Ambulation Distance (Feet): 1200 Feet Assistive device: None Gait Pattern/deviations: WFL(Within Functional Limits) Gait velocity: mod pace Gait velocity interpretation: Below normal speed for age/gender General Gait Details: Pt ambulated unassisted with good stability and no LOB.  Pt demonstrated ability to side step, back step, stork stand and tandem walk with no difficulty.  Stairs            Wheelchair Mobility    Modified Rankin (Stroke Patients Only)       Balance Overall balance assessment: Independent                                           Pertinent Vitals/Pain Pain Assessment: No/denies pain    Home Living Family/patient expects to be discharged to:: Private residence Living Arrangements: Spouse/significant other Available Help at Discharge: Family Type of Home: House Home Access: Stairs to enter   Secretary/administratorntrance Stairs-Number of Steps: 1 Home Layout: One  level Home Equipment: None      Prior Function Level of Independence: Independent               Hand Dominance        Extremity/Trunk Assessment   Upper Extremity Assessment Upper Extremity Assessment: Overall WFL for tasks assessed    Lower Extremity Assessment Lower Extremity Assessment: Overall WFL for tasks assessed    Cervical / Trunk Assessment Cervical / Trunk Assessment: Normal  Communication   Communication: No difficulties  Cognition Arousal/Alertness: Awake/alert Behavior During Therapy: Impulsive Overall Cognitive Status: Within Functional Limits for tasks assessed                                        General Comments      Exercises     Assessment/Plan    PT Assessment Patent does not need any further PT services  PT Problem List         PT Treatment Interventions      PT Goals (Current goals can be found in the Care Plan section)  Acute Rehab PT Goals Patient Stated Goal: HOME ASAP PT Goal Formulation: All assessment and education complete, DC therapy    Frequency     Barriers to discharge  Co-evaluation               AM-PAC PT "6 Clicks" Daily Activity  Outcome Measure Difficulty turning over in bed (including adjusting bedclothes, sheets and blankets)?: None Difficulty moving from lying on back to sitting on the side of the bed? : None Difficulty sitting down on and standing up from a chair with arms (e.g., wheelchair, bedside commode, etc,.)?: None Help needed moving to and from a bed to chair (including a wheelchair)?: None Help needed walking in hospital room?: None Help needed climbing 3-5 steps with a railing? : None 6 Click Score: 24    End of Session Equipment Utilized During Treatment: Gait belt Activity Tolerance: Patient tolerated treatment well Patient left: in bed Nurse Communication: Mobility status PT Visit Diagnosis: Difficulty in walking, not elsewhere classified (R26.2)     Time: [id number] PT Time Calculation (min) (ACUTE ONLY): 20 min   Charges:   PT Evaluation $PT Eval Low Complexity: 1 Low     PT G Codes:        Pg [phone]   Yanissa Michalsky 09/02/2017, 12:19 PM

## 2017-09-02 NOTE — Discharge Summary (Signed)
Physician Discharge Summary  Dennis ChurchMichael C [name] ZOX:[id number] DOB: 08-Oct-1970 DOA: 08/30/2017  PCP: Patient, No Pcp Per  Admit date: 08/30/2017 Discharge date: 09/02/2017  Admitted From: Home disposition: Home  Recommendations for Outpatient Follow-up:  1. Follow up with PCP in 1-2 weeks.  Patient to follow-up with AA. Please obtain BMP/CBC in one week Equipment/Device none Home Health none Discharge Condition CODE STATUS:full Diet recommendation regular Brief/Interim Summary:Narrative46 y.o.malewith medical history significant forPTSD and severe alcohol dependence, now presenting to the emergency department after generalized seizure-like activity witnessed at home. Patient reportedly drinks 48 beers every day, but had said that he decided to stop after a drink early this afternoon. He was noted by a roommate to have a brief episode ofgeneralized seizure-like activity and EMS was called. Seizure activity had resolved by the time of EMS arrival, but there is concern for alcohol withdrawal and he was treated with Versed in the field. He was also given 300 cc of normal saline and transported to the hospital. Patient reports that he wants toquit alcohol.  ED Course:Upon arrival to the ED, patient is found to be afebrile, saturating low 90s on room air, slightly tachycardic, and with stable blood pressure. EKG features a sinus tachycardia with rate 108. Noncontrast head CT is a normal study. Chemistry panel reveals a slight hypokalemia with potassium of 3.4, and a slight elevation in AST to 50. CBC is notable for a mild normocytic anemia with hemoglobin of 12.7. UDS is positive for THC and benzodiazepines, and ethanol level is elevated to 168. Patient was given a liter of normal saline and 2 mg of Ativan in the ED. He remained hemodynamically stable, has not been in any apparent respiratory distress, and will be admitted to the stepdown unit for ongoing evaluation and management of  seizure, suspected secondary to alcohol withdrawal. Alcohol withdrawal/alcohol abuse   Discharge Diagnoses:  Principal Problem:   Seizure due to alcohol withdrawal (HCC) Active Problems:   Alcohol abuse   Alcohol withdrawal seizure (HCC)  Alcohol abuse/delirium tremens-patient was admitted to stepdown unit and treated with Ativan and symptomatic treatment per [facility] protocol.  Patient did not have any witnessed seizure during this hospital stay.  Patient was seen by physical therapy on the day of discharge they walked him up and down the hallway he did well with ambulating in the hallway.  I have discussed him in length about following up with AA and encouraged him to stop consuming alcohol.  He reported that he does have a son who is 45 years old going [facility] [place] [facility] [employer] [facility] [place].  Patient will be discharged today on a Librium taper for 8 days.  Patient will follow up with AA and wellness clinic.  Discharge Instructions    Follow-up Information    [facility] Follow up.   Contact information: [address] [place] [phone]         Allergies  Allergen Reactions  . Paxil [Paroxetine Hcl] Hives     Procedures/Studies: Ct Head Wo Contrast  Result Date: 08/30/2017 CLINICAL DATA:  Seizure. EXAM: CT HEAD WITHOUT CONTRAST TECHNIQUE: Contiguous axial images were obtained from the base of the skull through the vertex without intravenous contrast. COMPARISON:  None. FINDINGS: Brain: No mass lesion, intraparenchymal hemorrhage or extra-axial collection. No evidence of acute cortical infarct. Brain parenchyma and CSF-containing spaces are normal for age. Vascular: No hyperdense vessel or unexpected calcification. Skull: Normal visualized skull base, calvarium  and extracranial soft tissues. Sinuses/Orbits: No sinus fluid levels or advanced mucosal thickening. No mastoid effusion. Normal orbits.  IMPRESSION: Normal head CT. Electronically Signed   By: Deatra RobinsonKevin  Herman M.D.   On: 08/30/2017 23:08    (Echo, Carotid, EGD, Colonoscopy, ERCP)    Subjective:   Discharge Exam: Vitals:   09/02/17 0345 09/02/17 0800  BP:  116/75  Pulse: 96   Resp:    Temp:  (!) 97.4 F (36.3 C)  SpO2:     Vitals:   09/02/17 0300 09/02/17 0341 09/02/17 0345 09/02/17 0800  BP: 97/62   116/75  Pulse:   96   Resp:      Temp:  97.6 F (36.4 C)  (!) 97.4 F (36.3 C)  TempSrc:  Oral  Oral  SpO2:      Weight:      Height:        General: Pt is alert, awake, not in acute distress Cardiovascular: RRR, S1/S2 +, no rubs, no gallops Respiratory: CTA bilaterally, no wheezing, no rhonchi Abdominal: Soft, NT, ND, bowel sounds + Extremities: no edema, no cyanosis    The results of significant diagnostics from this hospitalization (including imaging, microbiology, ancillary and laboratory) are listed below for reference.     Microbiology: Recent Results (from the past 240 hour(s))  MRSA PCR Screening     Status: None   Collection Time: 08/31/17 12:48 AM  Result Value Ref Range Status   MRSA by PCR NEGATIVE NEGATIVE Final    Comment:        The GeneXpert MRSA Assay (FDA approved for NASAL specimens only), is one component of a comprehensive MRSA colonization surveillance program. It is not intended to diagnose MRSA infection nor to guide or monitor treatment for MRSA infections.      Labs: BNP (last 3 results) No results for input(s): BNP in the last 8760 hours. Basic Metabolic Panel: Recent Labs  Lab 08/30/17 1240 08/30/17 2117 08/31/17 0329 09/01/17 0551 09/02/17 0319  NA 140 138 139 138 140  K 3.4* 3.4* 3.7 3.5 3.6  CL 104 100* 105 103 105  CO2 25 26 24 28 26   GLUCOSE 159* 98 68 82 89  BUN 6 11 9 7 8   CREATININE 0.73 0.61 0.54* 0.62 0.67  CALCIUM 8.4* 8.0* 7.9* 8.5* 9.0  MG  --   --   --  2.3  --    Liver Function Tests: Recent Labs  Lab 08/30/17 1240 08/30/17 2117  08/31/17 0329 09/01/17 0551 09/02/17 0319  AST 51* 50* 49* 65* 54*  ALT 38 33 33 44 47  ALKPHOS 53 43 43 50 58  BILITOT 0.5 0.9 1.4* 1.3* 1.0  PROT 7.2 6.8 6.5 6.8 7.3  ALBUMIN 4.1 3.9 3.7 3.9 3.9   No results for input(s): LIPASE, AMYLASE in the last 168 hours. No results for input(s): AMMONIA in the last 168 hours. CBC: Recent Labs  Lab 08/30/17 1240 08/30/17 2117 08/31/17 0329 09/01/17 0551 09/02/17 0319  WBC 7.1 10.5 9.1 5.8 6.3  NEUTROABS  --  7.5  --   --   --   HGB 14.9 12.7* 12.9* 13.6 14.5  HCT 43.7 37.2* 37.8* 40.4 43.0  MCV 80.3 79.5 79.1 79.8 80.4  PLT 200 184 177 145* 144*   Cardiac Enzymes: No results for input(s): CKTOTAL, CKMB, CKMBINDEX, TROPONINI in the last 168 hours. BNP: Invalid input(s): POCBNP CBG: No results for input(s): GLUCAP in the last 168 hours. D-Dimer No results for input(s): DDIMER  in the last 72 hours. Hgb A1c No results for input(s): HGBA1C in the last 72 hours. Lipid Profile No results for input(s): CHOL, HDL, LDLCALC, TRIG, CHOLHDL, LDLDIRECT in the last 72 hours. Thyroid function studies No results for input(s): TSH, T4TOTAL, T3FREE, THYROIDAB in the last 72 hours.  Invalid input(s): FREET3 Anemia work up No results for input(s): VITAMINB12, FOLATE, FERRITIN, TIBC, IRON, RETICCTPCT in the last 72 hours. Urinalysis No results found for: COLORURINE, APPEARANCEUR, LABSPEC, PHURINE, GLUCOSEU, HGBUR, BILIRUBINUR, KETONESUR, PROTEINUR, UROBILINOGEN, NITRITE, LEUKOCYTESUR Sepsis Labs Invalid input(s): PROCALCITONIN,  WBC,  LACTICIDVEN Microbiology Recent Results (from the past 240 hour(s))  MRSA PCR Screening     Status: None   Collection Time: 08/31/17 12:48 AM  Result Value Ref Range Status   MRSA by PCR NEGATIVE NEGATIVE Final    Comment:        The GeneXpert MRSA Assay (FDA approved for NASAL specimens only), is one component of a comprehensive MRSA colonization surveillance program. It is not intended to diagnose  MRSA infection nor to guide or monitor treatment for MRSA infections.      Time coordinating discharge: Over 30 minutes  SIGNED:   Alwyn Ren, MD  [facility] 09/02/2017, 11:38 AM Pager   If 7PM-7AM, please contact night-coverage [email or web address] Password [email or web address]

## 2017-09-07 ENCOUNTER — Ambulatory Visit (HOSPITAL_COMMUNITY): Payer: Self-pay | Admitting: Psychology

## 2017-09-08 ENCOUNTER — Ambulatory Visit (HOSPITAL_COMMUNITY): Payer: BLUE CROSS/BLUE SHIELD | Admitting: Psychology

## 2017-09-10 ENCOUNTER — Telehealth (HOSPITAL_COMMUNITY): Payer: Self-pay

## 2017-09-10 ENCOUNTER — Other Ambulatory Visit (HOSPITAL_COMMUNITY): Payer: Self-pay | Admitting: Medical

## 2017-09-10 MED ORDER — CHLORDIAZEPOXIDE HCL 10 MG PO CAPS: 10.0000 mg | ORAL_CAPSULE | Freq: Four times a day (QID) | ORAL | 0 refills | Status: AC

## 2017-09-10 NOTE — Progress Notes (Unsigned)
Extending Librium for W/D symptoms 4 days 10 mg dose

## 2017-09-10 NOTE — Telephone Encounter (Signed)
Per Maryjean Mornharles Kober, RX for ChlordiazePoxide 10mg  was faxed to pharmacy. Nothing further is needed at this time.

## 2017-12-09 DIAGNOSIS — W19XXXA Unspecified fall, initial encounter: Secondary | ICD-10-CM | POA: Diagnosis present

## 2017-12-09 DIAGNOSIS — F17210 Nicotine dependence, cigarettes, uncomplicated: Secondary | ICD-10-CM | POA: Diagnosis present

## 2017-12-09 DIAGNOSIS — F10231 Alcohol dependence with withdrawal delirium: Principal | ICD-10-CM | POA: Diagnosis present

## 2017-12-09 DIAGNOSIS — Z811 Family history of alcohol abuse and dependence: Secondary | ICD-10-CM

## 2017-12-09 DIAGNOSIS — F1210 Cannabis abuse, uncomplicated: Secondary | ICD-10-CM | POA: Diagnosis present

## 2017-12-09 DIAGNOSIS — R569 Unspecified convulsions: Secondary | ICD-10-CM | POA: Diagnosis present

## 2017-12-09 DIAGNOSIS — Z888 Allergy status to other drugs, medicaments and biological substances status: Secondary | ICD-10-CM

## 2017-12-10 ENCOUNTER — Encounter (HOSPITAL_COMMUNITY): Payer: Self-pay

## 2017-12-10 ENCOUNTER — Other Ambulatory Visit: Payer: Self-pay

## 2017-12-10 ENCOUNTER — Inpatient Hospital Stay (HOSPITAL_COMMUNITY)
Admission: EM | Admit: 2017-12-10 | Discharge: 2017-12-12 | DRG: 897 | Disposition: A | Discharge status: Home / Self Care | Payer: BLUE CROSS/BLUE SHIELD | Attending: Internal Medicine | Admitting: Internal Medicine

## 2017-12-10 ENCOUNTER — Observation Stay (HOSPITAL_COMMUNITY): Payer: BLUE CROSS/BLUE SHIELD

## 2017-12-10 DIAGNOSIS — F1020 Alcohol dependence, uncomplicated: Secondary | ICD-10-CM | POA: Diagnosis present

## 2017-12-10 DIAGNOSIS — F1010 Alcohol abuse, uncomplicated: Secondary | ICD-10-CM | POA: Diagnosis present

## 2017-12-10 DIAGNOSIS — F10230 Alcohol dependence with withdrawal, uncomplicated: Secondary | ICD-10-CM

## 2017-12-10 DIAGNOSIS — F10239 Alcohol dependence with withdrawal, unspecified: Secondary | ICD-10-CM | POA: Diagnosis present

## 2017-12-10 DIAGNOSIS — F10231 Alcohol dependence with withdrawal delirium: Secondary | ICD-10-CM | POA: Diagnosis present

## 2017-12-10 DIAGNOSIS — F1210 Cannabis abuse, uncomplicated: Secondary | ICD-10-CM | POA: Diagnosis present

## 2017-12-10 DIAGNOSIS — F10931 Alcohol use, unspecified with withdrawal delirium: Secondary | ICD-10-CM | POA: Diagnosis present

## 2017-12-10 DIAGNOSIS — F10939 Alcohol use, unspecified with withdrawal, unspecified: Secondary | ICD-10-CM | POA: Diagnosis present

## 2017-12-10 LAB — COMPREHENSIVE METABOLIC PANEL
ALT: 37 U/L (ref 17–63)
AST: 48 U/L — AB (ref 15–41)
Albumin: 4.3 g/dL (ref 3.5–5.0)
Alkaline Phosphatase: 56 U/L (ref 38–126)
Anion gap: 13 (ref 5–15)
BUN: 19 mg/dL (ref 6–20)
CO2: 24 mmol/L (ref 22–32)
Calcium: 8.9 mg/dL (ref 8.9–10.3)
Chloride: 102 mmol/L (ref 101–111)
Creatinine, Ser: 0.88 mg/dL (ref 0.61–1.24)
GFR calc non Af Amer: >60 mL/min (ref >60)
Glucose, Bld: 88 mg/dL (ref 65–99)
POTASSIUM: 3.9 mmol/L (ref 3.5–5.1)
SODIUM: 139 mmol/L (ref 135–145)
Total Bilirubin: 0.7 mg/dL (ref 0.3–1.2)
Total Protein: 8.1 g/dL (ref 6.5–8.1)

## 2017-12-10 LAB — RAPID URINE DRUG SCREEN, HOSP PERFORMED
AMPHETAMINES: NOT DETECTED
BARBITURATES: NOT DETECTED
BENZODIAZEPINES: NOT DETECTED
COCAINE: NOT DETECTED
Opiates: NOT DETECTED
TETRAHYDROCANNABINOL: POSITIVE — AB

## 2017-12-10 LAB — ETHANOL: Alcohol, Ethyl (B): 177 mg/dL — ABNORMAL HIGH (ref <10)

## 2017-12-10 LAB — CBC WITH DIFFERENTIAL/PLATELET
Basophils Absolute: 0 10*3/uL (ref 0.0–0.1)
Basophils Relative: 0 %
Eosinophils Absolute: 0.1 10*3/uL (ref 0.0–0.7)
Eosinophils Relative: 1 %
HEMATOCRIT: 44.6 % (ref 39.0–52.0)
Hemoglobin: 15.5 g/dL (ref 13.0–17.0)
LYMPHS ABS: 3 10*3/uL (ref 0.7–4.0)
LYMPHS PCT: 37 %
MCH: 27.4 pg (ref 26.0–34.0)
MCHC: 34.8 g/dL (ref 30.0–36.0)
MCV: 78.9 fL (ref 78.0–100.0)
Monocytes Absolute: 0.6 10*3/uL (ref 0.1–1.0)
Monocytes Relative: 8 %
NEUTROS ABS: 4.4 10*3/uL (ref 1.7–7.7)
Neutrophils Relative %: 54 %
Platelets: 257 10*3/uL (ref 150–400)
RBC: 5.65 MIL/uL (ref 4.22–5.81)
RDW: 14.1 % (ref 11.5–15.5)
WBC: 8.2 10*3/uL (ref 4.0–10.5)

## 2017-12-10 LAB — TROPONIN I: Troponin I: <0.03 ng/mL (ref <0.03)

## 2017-12-10 LAB — MAGNESIUM: MAGNESIUM: 1.6 mg/dL — AB (ref 1.7–2.4)

## 2017-12-10 LAB — LIPASE, BLOOD: Lipase: 36 U/L (ref 11–51)

## 2017-12-10 LAB — CK: CK TOTAL: 213 U/L (ref 49–397)

## 2017-12-10 MED ORDER — HALOPERIDOL LACTATE 5 MG/ML IJ SOLN
5.0000 mg | Freq: Once | INTRAMUSCULAR | Status: AC
  Administered 2017-12-10: 5 mg via INTRAVENOUS
  Filled 2017-12-10: qty 1

## 2017-12-10 MED ORDER — LORAZEPAM 1 MG PO TABS: 0.0000 mg | ORAL_TABLET | Freq: Two times a day (BID) | ORAL | Status: DC

## 2017-12-10 MED ORDER — THIAMINE HCL 100 MG/ML IJ SOLN
100.0000 mg | Freq: Every day | INTRAMUSCULAR | Status: DC
  Administered 2017-12-10 – 2017-12-11 (×2): 100 mg via INTRAVENOUS
  Filled 2017-12-10: qty 2

## 2017-12-10 MED ORDER — ONDANSETRON HCL 4 MG PO TABS: 4.0000 mg | ORAL_TABLET | Freq: Three times a day (TID) | ORAL | Status: DC | PRN

## 2017-12-10 MED ORDER — ACETAMINOPHEN 650 MG RE SUPP: 650.0000 mg | Freq: Four times a day (QID) | RECTAL | Status: DC | PRN

## 2017-12-10 MED ORDER — LORAZEPAM 1 MG PO TABS: 0.0000 mg | ORAL_TABLET | Freq: Four times a day (QID) | ORAL | Status: DC

## 2017-12-10 MED ORDER — ACETAMINOPHEN 325 MG PO TABS
650.0000 mg | ORAL_TABLET | Freq: Four times a day (QID) | ORAL | Status: DC | PRN
  Administered 2017-12-10: 650 mg via ORAL
  Filled 2017-12-10: qty 2

## 2017-12-10 MED ORDER — SODIUM CHLORIDE 0.9 % IV BOLUS (SEPSIS)
3000.0000 mL | Freq: Once | INTRAVENOUS | Status: AC
  Administered 2017-12-10: 3000 mL via INTRAVENOUS

## 2017-12-10 MED ORDER — ONDANSETRON HCL 4 MG/2ML IJ SOLN
4.0000 mg | Freq: Four times a day (QID) | INTRAMUSCULAR | Status: DC | PRN
  Administered 2017-12-10 – 2017-12-12 (×2): 4 mg via INTRAVENOUS
  Filled 2017-12-10 (×2): qty 2

## 2017-12-10 MED ORDER — LORAZEPAM 2 MG/ML IJ SOLN: 0.0000 mg | Freq: Two times a day (BID) | INTRAMUSCULAR | Status: DC

## 2017-12-10 MED ORDER — THIAMINE HCL 100 MG/ML IJ SOLN: 100.0000 mg | Freq: Every day | INTRAMUSCULAR | Status: DC

## 2017-12-10 MED ORDER — LORAZEPAM 2 MG/ML IJ SOLN
2.0000 mg | INTRAMUSCULAR | Status: DC | PRN
  Administered 2017-12-10 – 2017-12-12 (×20): 2 mg via INTRAVENOUS
  Filled 2017-12-10 (×20): qty 1

## 2017-12-10 MED ORDER — HALOPERIDOL LACTATE 5 MG/ML IJ SOLN
2.0000 mg | Freq: Four times a day (QID) | INTRAMUSCULAR | Status: DC | PRN
  Administered 2017-12-11 – 2017-12-12 (×3): 2 mg via INTRAVENOUS
  Filled 2017-12-10 (×3): qty 1

## 2017-12-10 MED ORDER — ONDANSETRON HCL 4 MG PO TABS: 4.0000 mg | ORAL_TABLET | Freq: Four times a day (QID) | ORAL | Status: DC | PRN

## 2017-12-10 MED ORDER — VITAMIN B-1 100 MG PO TABS: 100.0000 mg | ORAL_TABLET | Freq: Every day | ORAL | Status: DC

## 2017-12-10 MED ORDER — SODIUM CHLORIDE 0.9 % IV SOLN
INTRAVENOUS | Status: AC
  Administered 2017-12-10 (×2): via INTRAVENOUS

## 2017-12-10 MED ORDER — FOLIC ACID 5 MG/ML IJ SOLN
1.0000 mg | Freq: Every day | INTRAMUSCULAR | Status: DC
  Administered 2017-12-10 – 2017-12-11 (×2): 1 mg via INTRAVENOUS
  Filled 2017-12-10 (×3): qty 0.2

## 2017-12-10 MED ORDER — LORAZEPAM 2 MG/ML IJ SOLN
0.0000 mg | Freq: Four times a day (QID) | INTRAMUSCULAR | Status: DC
  Administered 2017-12-10 (×2): 2 mg via INTRAVENOUS
  Filled 2017-12-10 (×2): qty 1

## 2017-12-10 NOTE — ED Notes (Signed)
Pt is getting very agitated, c/o getting claustrophobic, wanting to get out for a walk, sts he wants to take the monitor off, asking to leave. Admitting MD was paged.

## 2017-12-10 NOTE — H&P (Signed)
History and Physical    Dennis ChurchMichael C Labus ZOX:[id number] DOB: [birth year] DOA: 12/10/2017  PCP: Patient, No Pcp Per  Patient coming from: Home.  Chief Complaint: Tremors.  HPI: [name] is a 47 y.o. male with history of alcohol abuse presents to the ER with complaints of having tremors and loss consciousness.  Patient states he has been trying to cut down her alcohol and yesterday morning had an episode of nausea vomiting with no hematemesis.  Denied any abdominal pain chest pain or shortness of breath.  Later on he lost consciousness and fell onto the floor and may have remained so for some time.  Patient thinks he may have had a seizure because he has had seizures previously from alcohol withdrawal.  Following which after waking up patient was very tremulous and had generalized body ache.  Patient felt that he was going to withdrawal and came to the ER.  ED Course: In the ER the ER physician noted the patient was tremulous tachycardic and clinically looked to be in alcohol withdrawal but was given 4 mg of Ativan following which patient has been less tremulous but still tachycardic and admitted for further management of alcohol withdrawal.  On exam patient denies any chest pain or shortness of breath follows commands moves all extremities.  But falls asleep from recent Ativan dose.  Review of Systems: As per HPI, rest all negative.   Past Medical History:  Diagnosis Date  . Active smoker   . Alcohol abuse   . Anxiety   . Bipolar 2 disorder (HCC)   . Depression     Past Surgical History:  Procedure Laterality Date  . LYMPH NODE DISSECTION       reports that he has been smoking cigarettes.  He has a 40.00 pack-year smoking history. he has never used smokeless tobacco. He reports that he drinks about 151.2 oz of alcohol per week. He reports that he uses drugs. Drug: Marijuana.  Allergies  Allergen Reactions  . Paxil [Paroxetine Hcl] Hives    Family History  Problem  Relation Age of Onset  . Alcohol abuse Father     Prior to Admission medications   Medication Sig Start Date End Date Taking? Authorizing Provider  folic acid (FOLVITE) 1 MG tablet Take 1 tablet (1 mg total) by mouth daily. Patient not taking: Reported on 08/26/2017 07/08/17   Vassie LollMadera, Carlos, MD  Multiple Vitamin (MULTIVITAMIN WITH MINERALS) TABS tablet Take 1 tablet by mouth daily. Patient not taking: Reported on 07/07/2017 04/29/17   Glade LloydAlekh, Kshitiz, MD  nicotine (NICODERM CQ - DOSED IN MG/24 HOURS) 21 mg/24hr patch Place 1 patch (21 mg total) onto the skin daily. Patient not taking: Reported on 08/26/2017 07/09/17   Vassie LollMadera, Carlos, MD  thiamine 100 MG tablet Take 1 tablet (100 mg total) by mouth daily. Patient not taking: Reported on 12/10/2017 07/09/17   Vassie LollMadera, Carlos, MD    Physical Exam: Vitals:   12/10/17 0035 12/10/17 0126 12/10/17 0355  BP: (!) 140/99 118/84 (!) 111/59  Pulse: (!) 133 (!) 120 (!) 116  Resp: 20  14  Temp: 97.8 F (36.6 C)    TempSrc: Oral    SpO2: 99%  94%      Constitutional: Moderately built and nourished. Vitals:   12/10/17 0035 12/10/17 0126 12/10/17 0355  BP: (!) 140/99 118/84 (!) 111/59  Pulse: (!) 133 (!) 120 (!) 116  Resp: 20  14  Temp: 97.8 F (36.6 C)    TempSrc: Oral  SpO2: 99%  94%   Eyes: Anicteric no pallor. ENMT: No discharge from the ears eyes nose or mouth. Neck: No neck rigidity no mass felt. Respiratory: No rhonchi or crepitations. Cardiovascular: S1-S2 heard tachycardic. Abdomen: Soft nontender bowel sounds present. Musculoskeletal: No edema.  No joint effusion. Skin: No rash. Neurologic: Patient is sedated but easily arousable and answers questions appropriately oriented to place and time and person.  Moves all extremities. Psychiatric: The patient is mildly sedated but answers questions appropriately.   Labs on Admission: I have personally reviewed following labs and imaging studies  CBC: Recent Labs  Lab  12/10/17 0131  WBC 8.2  NEUTROABS 4.4  HGB 15.5  HCT 44.6  MCV 78.9  PLT 257   Basic Metabolic Panel: Recent Labs  Lab 12/10/17 0131  NA 139  K 3.9  CL 102  CO2 24  GLUCOSE 88  BUN 19  CREATININE 0.88  CALCIUM 8.9   GFR: CrCl cannot be calculated (Unknown ideal weight.). Liver Function Tests: Recent Labs  Lab 12/10/17 0131  AST 48*  ALT 37  ALKPHOS 56  BILITOT 0.7  PROT 8.1  ALBUMIN 4.3   Recent Labs  Lab 12/10/17 0131  LIPASE 36   No results for input(s): AMMONIA in the last 168 hours. Coagulation Profile: No results for input(s): INR, PROTIME in the last 168 hours. Cardiac Enzymes: No results for input(s): CKTOTAL, CKMB, CKMBINDEX, TROPONINI in the last 168 hours. BNP (last 3 results) No results for input(s): PROBNP in the last 8760 hours. HbA1C: No results for input(s): HGBA1C in the last 72 hours. CBG: No results for input(s): GLUCAP in the last 168 hours. Lipid Profile: No results for input(s): CHOL, HDL, LDLCALC, TRIG, CHOLHDL, LDLDIRECT in the last 72 hours. Thyroid Function Tests: No results for input(s): TSH, T4TOTAL, FREET4, T3FREE, THYROIDAB in the last 72 hours. Anemia Panel: No results for input(s): VITAMINB12, FOLATE, FERRITIN, TIBC, IRON, RETICCTPCT in the last 72 hours. Urine analysis: No results found for: COLORURINE, APPEARANCEUR, LABSPEC, PHURINE, GLUCOSEU, HGBUR, BILIRUBINUR, KETONESUR, PROTEINUR, UROBILINOGEN, NITRITE, LEUKOCYTESUR Sepsis Labs: @LABRCNTIP (procalcitonin:4,lacticidven:4) )No results found for this or any previous visit (from the past 240 hour(s)).   Radiological Exams on Admission: No results found.    Assessment/Plan Active Problems:   Alcohol abuse   Marijuana abuse   Delirium tremens (HCC)   Alcohol withdrawal (HCC)    1. Alcohol withdrawal -patient is placed on CIWA protocol.  We will closely monitor in telemetry. 2. Fall -not sure if patient had a seizure.  Since it was an unwitnessed fall will get a  CT head.  Closely monitor. 3. Marijuana abuse -Child psychotherapist consult.  EKG CT head troponin and CK levels are pending.   DVT prophylaxis: SCDs for now until CT head results are available. Code Status: Full code. Family Communication: Discussed with patient. Disposition Plan: To be determined. Consults called: None. Admission status: Observation.   Eduard Clos MD [facility] Pager [phone].  If 7PM-7AM, please contact night-coverage [email or web address] Password [facility]  12/10/2017, 4:48 AM

## 2017-12-10 NOTE — Progress Notes (Signed)
Patient is transferred from ED at 1630. Asleep. Vital signs was taken.

## 2017-12-10 NOTE — Progress Notes (Signed)
PROGRESS NOTE    Patient: [name]     PCP: Patient, No Pcp Per                    DOB: 10-Sep-1971            DOA: 12/10/2017 WJX:[id number]             DOS: 12/10/2017, 12:19 PM   LOS: 0 days   Date of Service: The patient was seen and examined on 12/10/2017  Subjective:  Was seen and examined, stable on O2 via [place] sat. >92 % Stable over night,  Having Tremors   ----------------------------------------------------------------------------------------------------------------------  Brief Narrative:  [name] is a 47 y.o. male with history of alcohol abuse presents  with complaints of having tremors and loss consciousness.  Patient states he has been trying to cut down her alcohol and yesterday morning had an episode of nausea vomiting with no hematemesis.  On there day of admission he thinks he lost consciousness and fell onto the floor and may have remained so for some time.  Patient thinks he may have had a seizure because he has had seizures previously from alcohol withdrawal.  Following which after waking up patient was very tremulous and had generalized body aches.  Active Problems:   Alcohol abuse   Marijuana abuse   Delirium tremens (HCC)   Alcohol withdrawal (HCC)   Assessment & Plan:   Alcohol withdrawal  -Cont. The patient On  CIWA protocol.   -We will closely monitor in telemetry. -still tachycardic   Fall -not sure if patient had a seizure.   - CT head was negative    Substance abuse - Marijuana abuse  - patient was advised to quite  -Child psychotherapistsocial worker consult.  EKG CT head troponin and CK levels are normal    DVT prophylaxis: SCDs for now until CT head results are available. Code Status: Full code. Family Communication: Discussed with patient. Disposition Plan: To be determined. Consults called: None. Admission status: Observation.     Procedures:  No admission procedures for hospital encounter.   Antimicrobials:  Anti-infectives  (From admission, onward)   None      Objective: Vitals:   12/10/17 0845 12/10/17 0859 12/10/17 1139 12/10/17 1201  BP: 124/82 (!) 109/56  140/69  Pulse: (!) 124 (!) 123 (!) 105 (!) 110  Resp:  (!) 26 19 (!) 25  Temp:      TempSrc:      SpO2:  98% 96% 96%    Intake/Output Summary (Last 24 hours) at 12/10/2017 1219 Last data filed at 12/10/2017 0504 Gross per 24 hour  Intake 3000 ml  Output -  Net 3000 ml   There were no vitals filed for this visit.  Examination:  General exam: Appears calm and comfortable  Psychiatry: Judgement and insight appear normal. Mood & affect appropriate. HEENT: WNLs Respiratory system: Clear to auscultation. Respiratory effort normal. Cardiovascular system: S1 & S2 heard, RRR. No JVD, murmurs, rubs, gallops or clicks. No pedal edema. Gastrointestinal system: Abd. nondistended, soft and nontender. No organomegaly or masses felt. Normal bowel sounds heard. Central nervous system: Alert and oriented. No focal neurological deficits. Extremities: Symmetric 5 x 5 power. Skin: No rashes, lesions or ulcers Wounds:   Data Reviewed: I have personally reviewed following labs and imaging studies  CBC: Recent Labs  Lab 12/10/17 0131  WBC 8.2  NEUTROABS 4.4  HGB 15.5  HCT 44.6  MCV 78.9  PLT 257  Basic Metabolic Panel: Recent Labs  Lab 12/10/17 0131 12/10/17 0514  NA 139  --   K 3.9  --   CL 102  --   CO2 24  --   GLUCOSE 88  --   BUN 19  --   CREATININE 0.88  --   CALCIUM 8.9  --   MG  --  1.6*   GFR: CrCl cannot be calculated (Unknown ideal weight.). Liver Function Tests: Recent Labs  Lab 12/10/17 0131  AST 48*  ALT 37  ALKPHOS 56  BILITOT 0.7  PROT 8.1  ALBUMIN 4.3   Recent Labs  Lab 12/10/17 0131  LIPASE 36   No results for input(s): AMMONIA in the last 168 hours. Coagulation Profile: No results for input(s): INR, PROTIME in the last 168 hours. Cardiac Enzymes: Recent Labs  Lab 12/10/17 0514  CKTOTAL 213    TROPONINI <0.03   BNP (last 3 results) No results for input(s): PROBNP in the last 8760 hours. HbA1C: No results for input(s): HGBA1C in the last 72 hours. CBG: No results for input(s): GLUCAP in the last 168 hours. Lipid Profile: No results for input(s): CHOL, HDL, LDLCALC, TRIG, CHOLHDL, LDLDIRECT in the last 72 hours. Thyroid Function Tests: No results for input(s): TSH, T4TOTAL, FREET4, T3FREE, THYROIDAB in the last 72 hours. Anemia Panel: No results for input(s): VITAMINB12, FOLATE, FERRITIN, TIBC, IRON, RETICCTPCT in the last 72 hours. Sepsis Labs: No results for input(s): PROCALCITON, LATICACIDVEN in the last 168 hours.  No results found for this or any previous visit (from the past 240 hour(s)).    Radiology Studies: Ct Head Wo Contrast  Result Date: 12/10/2017 CLINICAL DATA:  Altered mental status and seizures from alcohol withdrawal. Recent fall. EXAM: CT HEAD WITHOUT CONTRAST TECHNIQUE: Contiguous axial images were obtained from the base of the skull through the vertex without intravenous contrast. COMPARISON:  CT head dated August 30, 2017. FINDINGS: Brain: No evidence of acute infarction, hemorrhage, hydrocephalus, extra-axial collection or mass lesion/mass effect. Vascular: No hyperdense vessel or unexpected calcification. Skull: Normal. Negative for fracture or focal lesion. Sinuses/Orbits: No acute finding. Other: None. IMPRESSION: 1. Normal for age noncontrast head CT. Electronically Signed   By: Obie Dredge M.D.   On: 12/10/2017 07:44    Scheduled Meds: . folic acid  1 mg Intravenous Daily  . thiamine  100 mg Intravenous Daily   Continuous Infusions: . sodium chloride 75 mL/hr at 12/10/17 2956    Time spent: >25 minutes  Kendell Bane, MD [facility],  Pager [phone]  If 7PM-7AM, please contact night-coverage [email or web address]   Password [facility]  12/10/2017, 12:19 PM

## 2017-12-10 NOTE — ED Notes (Signed)
[facility]

## 2017-12-10 NOTE — ED Triage Notes (Signed)
Pt states that his last beer was earlier today and that he had a seizure, pt is very shaky and looks dehydrated

## 2017-12-10 NOTE — ED Notes (Signed)
ED TO INPATIENT HANDOFF REPORT  Name/Age/Gender [name] 47 y.o. male  Code Status    Code Status Orders  (From admission, onward)        Start     Ordered   12/10/17 0447  Full code  Continuous     12/10/17 0447    Code Status History    Date Active Date Inactive Code Status Order ID Comments User Context   08/30/2017 23:30 09/02/2017 16:38 Full Code [id number]  Vianne Bulls, MD ED   07/07/2017 18:47 07/08/2017 16:25 Full Code [id number]  Oswald Hillock, MD Inpatient   07/07/2017 10:10 07/07/2017 18:47 Full Code [id number]  Waynetta Pean ED   05/27/2017 16:38 05/28/2017 20:02 Full Code [id number]  Doreatha Lew, MD ED   04/26/2017 18:42 04/28/2017 14:49 Full Code [id number]  Aline August, MD Inpatient   04/26/2017 13:32 04/26/2017 18:42 Full Code [id number]  Gareth Morgan, MD ED   03/02/2017 15:39 03/02/2017 15:43 Full Code [id number]  Chesley Mires, MD ED   03/17/2014 09:35 03/19/2014 12:34 Full Code [id number]  Debbe Odea, MD Inpatient   10/05/2013 19:51 10/10/2013 14:36 Full Code [id number]  Berle Mull, MD ED   02/09/2013 15:00 02/12/2013 16:51 Full Code [id number]  Theodis Blaze, MD ED   02/08/2013 21:31 02/09/2013 15:00 Full Code [id number]  Martie Lee, PA-C ED   11/16/2011 03:33 11/18/2011 21:44 Full Code [id number]  Dyann Ruddle, RN Inpatient   11/15/2011 22:09 11/16/2011 03:33 Full Code [id number]  Sheliah Mends, PA-C ED   11/09/2011 16:06 11/10/2011 17:36 Full Code [id number]  Muhoro, Jolene Schimke, RN Inpatient      Home/SNF/Other Home  Chief Complaint Seizures; Alcohol Withdrawal; Tremors  Level of Care/Admitting Diagnosis ED Disposition    ED Disposition Condition Comment   Admit  Hospital Area: [place] [[id number]]  Level of Care: Telemetry [5]  Admit to tele based on following criteria: Monitor QTC interval  Diagnosis: Alcohol withdrawal ([place]) [291.81.ICD-9-CM]  Admitting Physician: Rise Patience [phone]  Attending  Physician: Rise Patience [phone]  PT Class (Do Not Modify): Observation [104]  PT Acc Code (Do Not Modify): Observation [10022]       Medical History Past Medical History:  Diagnosis Date  . Active smoker   . Alcohol abuse   . Anxiety   . Bipolar 2 disorder ([place])   . Depression     Allergies Allergies  Allergen Reactions  . Paxil [Paroxetine Hcl] Hives    IV Location/Drains/Wounds Patient Lines/Drains/Airways Status   Active Line/Drains/Airways    Name:   Placement date:   Placement time:   Site:   Days:   Peripheral IV 09/02/17 Anterior;Distal;Right Arm   09/02/17    0415    Arm   99   Peripheral IV 12/10/17 Right Antecubital   12/10/17    0000    Antecubital   less than 1          Labs/Imaging Results for orders placed or performed during the hospital encounter of 12/10/17 (from the past 48 hour(s))  Comprehensive metabolic panel     Status: Abnormal   Collection Time: 12/10/17  1:31 AM  Result Value Ref Range   Sodium 139 135 - 145 mmol/L   Potassium 3.9 3.5 - 5.1 mmol/L   Chloride 102 101 - 111 mmol/L   CO2 24 22 - 32 mmol/L   Glucose, Bld 88 65 - 99 mg/dL   BUN 19 6 - 20 mg/dL   Creatinine,  Ser 0.88 0.61 - 1.24 mg/dL   Calcium 8.9 8.9 - 10.3 mg/dL   Total Protein 8.1 6.5 - 8.1 g/dL   Albumin 4.3 3.5 - 5.0 g/dL   AST 48 (H) 15 - 41 U/L   ALT 37 17 - 63 U/L   Alkaline Phosphatase 56 38 - 126 U/L   Total Bilirubin 0.7 0.3 - 1.2 mg/dL   GFR calc non Af Amer >60 >60 mL/min   GFR calc Af Amer >60 >60 mL/min    Comment: (NOTE) The eGFR has been calculated using the CKD EPI equation. This calculation has not been validated in all clinical situations. eGFR's persistently <60 mL/min signify possible Chronic Kidney Disease.    Anion gap 13 5 - 15    Comment: Performed at [facility], [place] [address] [place], [place] [address]  Ethanol     Status: Abnormal   Collection Time: 12/10/17  1:31 AM  Result Value Ref Range   Alcohol, Ethyl  (B) 177 (H) <10 mg/dL    Comment:        LOWEST DETECTABLE LIMIT FOR SERUM ALCOHOL IS 10 mg/dL FOR MEDICAL PURPOSES ONLY Performed at [place] [facility] [address], [place], [place] [address]   CBC with Differential     Status: None   Collection Time: 12/10/17  1:31 AM  Result Value Ref Range   WBC 8.2 4.0 - 10.5 K/uL   RBC 5.65 4.22 - 5.81 MIL/uL   Hemoglobin 15.5 13.0 - 17.0 g/dL   HCT 44.6 39.0 - 52.0 %   MCV 78.9 78.0 - 100.0 fL   MCH 27.4 26.0 - 34.0 pg   MCHC 34.8 30.0 - 36.0 g/dL   RDW 14.1 11.5 - 15.5 %   Platelets 257 150 - 400 K/uL   Neutrophils Relative % 54 %   Neutro Abs 4.4 1.7 - 7.7 K/uL   Lymphocytes Relative 37 %   Lymphs Abs 3.0 0.7 - 4.0 K/uL   Monocytes Relative 8 %   Monocytes Absolute 0.6 0.1 - 1.0 K/uL   Eosinophils Relative 1 %   Eosinophils Absolute 0.1 0.0 - 0.7 K/uL   Basophils Relative 0 %   Basophils Absolute 0.0 0.0 - 0.1 K/uL    Comment: Performed at [facility], [place] [address], [place], [place] [address]  Lipase, blood     Status: None   Collection Time: 12/10/17  1:31 AM  Result Value Ref Range   Lipase 36 11 - 51 U/L    Comment: Performed at [facility], [place] [address] [place], [place] [address]  Urine rapid drug screen (hosp performed)     Status: Abnormal   Collection Time: 12/10/17  1:51 AM  Result Value Ref Range   Opiates NONE DETECTED NONE DETECTED   Cocaine NONE DETECTED NONE DETECTED   Benzodiazepines NONE DETECTED NONE DETECTED   Amphetamines NONE DETECTED NONE DETECTED   Tetrahydrocannabinol POSITIVE (A) NONE DETECTED   Barbiturates NONE DETECTED NONE DETECTED    Comment: (NOTE) DRUG SCREEN FOR MEDICAL PURPOSES ONLY.  IF CONFIRMATION IS NEEDED FOR ANY PURPOSE, NOTIFY LAB WITHIN 5 DAYS. LOWEST DETECTABLE LIMITS FOR URINE DRUG SCREEN Drug Class                     Cutoff (ng/mL) Amphetamine and metabolites    1000 Barbiturate and metabolites    200 Benzodiazepine  619 Tricyclics and metabolites     300 Opiates and metabolites        300 Cocaine and metabolites        300 THC                            50 Performed at [facility], [place] [address] [place], [place] [address]   CK     Status: None   Collection Time: 12/10/17  5:14 AM  Result Value Ref Range   Total CK 213 49 - 397 U/L    Comment: Performed at [facility], [place] [address], [place], [place] [address]  Magnesium     Status: Abnormal   Collection Time: 12/10/17  5:14 AM  Result Value Ref Range   Magnesium 1.6 (L) 1.7 - 2.4 mg/dL    Comment: Performed at [facility], [place] [address], [place], [place] [address]  Troponin I     Status: None   Collection Time: 12/10/17  5:14 AM  Result Value Ref Range   Troponin I <0.03 <0.03 ng/mL    Comment: Performed at [facility], [place] [address], [place], [place] [address]   Ct Head Wo Contrast  Result Date: 12/10/2017 CLINICAL DATA:  Altered mental status and seizures from alcohol withdrawal. Recent fall. EXAM: CT HEAD WITHOUT CONTRAST TECHNIQUE: Contiguous axial images were obtained from the base of the skull through the vertex without intravenous contrast. COMPARISON:  CT head dated August 30, 2017. FINDINGS: Brain: No evidence of acute infarction, hemorrhage, hydrocephalus, extra-axial collection or mass lesion/mass effect. Vascular: No hyperdense vessel or unexpected calcification. Skull: Normal. Negative for fracture or focal lesion. Sinuses/Orbits: No acute finding. Other: None. IMPRESSION: 1. Normal for age noncontrast head CT. Electronically Signed   By: Titus Dubin M.D.   On: 12/10/2017 07:44    Pending Labs Unresulted Labs (From admission, onward)   Start     Ordered   12/11/17 5053  Basic metabolic panel  Tomorrow morning,   R     12/10/17 0447   12/11/17 0500  CBC  Tomorrow morning,   R     12/10/17 0447   12/11/17 0500  Magnesium   Tomorrow morning,   R     12/10/17 0447      Vitals/Pain Today's Vitals   12/10/17 0845 12/10/17 0859 12/10/17 1139 12/10/17 1201  BP: 124/82 (!) 109/56  140/69  Pulse: (!) 124 (!) 123 (!) 105 (!) 110  Resp:  (!) 26 19 (!) 25  Temp:      TempSrc:      SpO2:  98% 96% 96%    Isolation Precautions No active isolations  Medications Medications  acetaminophen (TYLENOL) tablet 650 mg (not administered)    Or  acetaminophen (TYLENOL) suppository 650 mg (not administered)  ondansetron (ZOFRAN) tablet 4 mg ( Oral See Alternative 12/10/17 1143)    Or  ondansetron (ZOFRAN) injection 4 mg (4 mg Intravenous Given 12/10/17 1143)  LORazepam (ATIVAN) injection 2-3 mg (2 mg Intravenous Given 06/[birth year] 3419)  folic acid injection 1 mg (1 mg Intravenous Given 12/10/17 1239)  thiamine (B-1) injection 100 mg (100 mg Intravenous Given 12/10/17 1230)  0.9 %  sodium chloride infusion ( Intravenous New Bag/Given 12/10/17 0504)  haloperidol lactate (HALDOL) injection 2 mg (not administered)  sodium chloride 0.9 % bolus 3,000 mL (0 mLs Intravenous Stopped 12/10/17 0504)  haloperidol lactate (HALDOL) injection 5 mg (  5 mg Intravenous Given 12/10/17 1415)    Mobility walks

## 2017-12-10 NOTE — ED Provider Notes (Signed)
[facility]-EMERGENCY DEPT Provider Note   CSN: [id number] Arrival date & time: 12/09/17  2355     History   Chief Complaint Chief Complaint  Patient presents with  . Delirium Tremens (DTS)    HPI [name] is a 47 y.o. male.  Patient presents with concern for alcohol withdrawal. He last drank this morning in small quantity that usual as he is trying to quit. He vomiting this morning without hematemesis but not since. He reports waking up on the floor this afternoon while alone in his apartment and presumes he had a seizure as he has had seizures with alcohol withdrawal in the past. He reports uncontrollable shaking and generalized body pain. He denies drug use.    The history is provided by the patient. No language interpreter was used.    Past Medical History:  Diagnosis Date  . Active smoker   . Alcohol abuse   . Anxiety   . Bipolar 2 disorder (HCC)   . Depression     Patient Active Problem List   Diagnosis Date Noted  . Seizure due to alcohol withdrawal (HCC) 08/30/2017  . Alcohol withdrawal seizure (HCC) 08/30/2017  . Tobacco abuse   . Delirium tremens (HCC) 03/02/2017  . GI bleed 10/06/2013  . Marijuana abuse 10/06/2013  . Suicidal ideation 10/05/2013  . Alcohol abuse 10/26/2011  . Depression 10/26/2011    Past Surgical History:  Procedure Laterality Date  . LYMPH NODE DISSECTION         Home Medications    Prior to Admission medications   Medication Sig Start Date End Date Taking? Authorizing Provider  folic acid (FOLVITE) 1 MG tablet Take 1 tablet (1 mg total) by mouth daily. Patient not taking: Reported on 08/26/2017 07/08/17   Vassie Loll, MD  Multiple Vitamin (MULTIVITAMIN WITH MINERALS) TABS tablet Take 1 tablet by mouth daily. Patient not taking: Reported on 07/07/2017 04/29/17   Glade Lloyd, MD  nicotine (NICODERM CQ - DOSED IN MG/24 HOURS) 21 mg/24hr patch Place 1 patch (21 mg total) onto the skin  daily. Patient not taking: Reported on 08/26/2017 07/09/17   Vassie Loll, MD  thiamine 100 MG tablet Take 1 tablet (100 mg total) by mouth daily. 07/09/17   Vassie Loll, MD    Family History Family History  Problem Relation Age of Onset  . Alcohol abuse Father     Social History Social History   Tobacco Use  . Smoking status: Current Every Day Smoker    Packs/day: 2.00    Years: 20.00    Pack years: 40.00    Types: Cigarettes  . Smokeless tobacco: Never Used  Substance Use Topics  . Alcohol use: Yes    Alcohol/week: 151.2 oz    Types: 252 Cans of beer per week    Comment: Last: 6am this morning;   . Drug use: Yes    Types: Marijuana    Comment: Last used: This morning      Allergies   Paxil [paroxetine hcl]   Review of Systems Review of Systems  Constitutional: Negative for chills and fever.  Respiratory: Negative.  Negative for shortness of breath.   Cardiovascular: Negative.  Negative for chest pain.  Gastrointestinal: Positive for nausea and vomiting.  Genitourinary:       No urinary incontinence  Musculoskeletal: Positive for myalgias.  Skin: Negative.   Neurological: Positive for tremors, seizures (presumed) and weakness.  Psychiatric/Behavioral: Positive for agitation. Negative for suicidal ideas.  Physical Exam Updated Vital Signs BP (!) 140/99 (BP Location: Left Arm)   Pulse (!) 133   Temp 97.8 F (36.6 C) (Oral)   Resp 20   SpO2 99%   Physical Exam  Constitutional: He is oriented to person, place, and time. He appears well-developed and well-nourished.  HENT:  Head: Normocephalic.  Neck: Normal range of motion. Neck supple.  Cardiovascular: Normal rate and regular rhythm.  Pulmonary/Chest: Effort normal and breath sounds normal.  Abdominal: Soft. Bowel sounds are normal. There is no tenderness. There is no rebound and no guarding.  Musculoskeletal: Normal range of motion.  Neurological: He is alert and oriented to person, place, and  time.  Generally tremulous without asterixis. Oriented, cooperative, coordinated.   Skin: Skin is warm and dry. No rash noted.  Psychiatric: His mood appears anxious. He is agitated. He expresses no homicidal and no suicidal ideation.     ED Treatments / Results  Labs (all labs ordered are listed, but only abnormal results are displayed) Labs Reviewed  COMPREHENSIVE METABOLIC PANEL  ETHANOL  CBC WITH DIFFERENTIAL/PLATELET  LIPASE, BLOOD  RAPID URINE DRUG SCREEN, HOSP PERFORMED    EKG  EKG Interpretation None       Radiology No results found.  Procedures Procedures (including critical care time)  Medications Ordered in ED Medications  LORazepam (ATIVAN) injection 0-4 mg (not administered)    Or  LORazepam (ATIVAN) tablet 0-4 mg (not administered)  LORazepam (ATIVAN) injection 0-4 mg (not administered)    Or  LORazepam (ATIVAN) tablet 0-4 mg (not administered)  thiamine (VITAMIN B-1) tablet 100 mg (not administered)    Or  thiamine (B-1) injection 100 mg (not administered)  ondansetron (ZOFRAN) tablet 4 mg (not administered)  sodium chloride 0.9 % bolus 3,000 mL (not administered)     Initial Impression / Assessment and Plan / ED Course  I have reviewed the triage vital signs and the nursing notes.  Pertinent labs & imaging results that were available during my care of the patient were reviewed by me and considered in my medical decision making (see chart for details).     Patient presents in alcohol withdrawal reporting seizure earlier today. History of seizures with withdrawal, with multiple admissions for same.   Ativan given per CIWA protocol, total 4 mg, with improvement of tremors. He is resting. Three liters fluids provided and he remains tachycardic.   He will need medical admission for alcohol withdrawal. Discussed with Dr. Toniann FailKakrakandy who accepts the patient onto his service.   Final Clinical Impressions(s) / ED Diagnoses   Final diagnoses:  None    1. Alcohol withdrawal 2. Chronic alcoholism  ED Discharge Orders    None       Elpidio AnisUpstill, Dinesha Twiggs, PA-C 12/10/17 [id number]    Devoria AlbeKnapp, Iva, MD 12/10/17 [phone]

## 2017-12-11 DIAGNOSIS — F10231 Alcohol dependence with withdrawal delirium: Secondary | ICD-10-CM | POA: Diagnosis not present

## 2017-12-11 DIAGNOSIS — F10239 Alcohol dependence with withdrawal, unspecified: Secondary | ICD-10-CM | POA: Diagnosis not present

## 2017-12-11 DIAGNOSIS — F1210 Cannabis abuse, uncomplicated: Secondary | ICD-10-CM

## 2017-12-11 DIAGNOSIS — F1010 Alcohol abuse, uncomplicated: Secondary | ICD-10-CM | POA: Diagnosis not present

## 2017-12-11 DIAGNOSIS — F1020 Alcohol dependence, uncomplicated: Secondary | ICD-10-CM | POA: Diagnosis present

## 2017-12-11 LAB — BASIC METABOLIC PANEL
ANION GAP: 7 (ref 5–15)
BUN: 10 mg/dL (ref 6–20)
CHLORIDE: 106 mmol/L (ref 101–111)
CO2: 28 mmol/L (ref 22–32)
Calcium: 8.6 mg/dL — ABNORMAL LOW (ref 8.9–10.3)
Creatinine, Ser: 0.69 mg/dL (ref 0.61–1.24)
GFR calc Af Amer: >60 mL/min (ref >60)
GFR calc non Af Amer: >60 mL/min (ref >60)
Glucose, Bld: 91 mg/dL (ref 65–99)
Potassium: 3.8 mmol/L (ref 3.5–5.1)
Sodium: 141 mmol/L (ref 135–145)

## 2017-12-11 LAB — CBC
HEMATOCRIT: 42.5 % (ref 39.0–52.0)
HEMOGLOBIN: 14.1 g/dL (ref 13.0–17.0)
MCH: 27 pg (ref 26.0–34.0)
MCHC: 33.2 g/dL (ref 30.0–36.0)
MCV: 81.3 fL (ref 78.0–100.0)
Platelets: 201 10*3/uL (ref 150–400)
RBC: 5.23 MIL/uL (ref 4.22–5.81)
RDW: 14.5 % (ref 11.5–15.5)
WBC: 6.4 10*3/uL (ref 4.0–10.5)

## 2017-12-11 LAB — MAGNESIUM: Magnesium: 2 mg/dL (ref 1.7–2.4)

## 2017-12-11 NOTE — Progress Notes (Signed)
PROGRESS NOTE    Patient: Dennis ChurchMichael C Sedlack     PCP: Patient, No Pcp Per                    DOB: 08-01-1971            DOA: 12/10/2017 [id number]             DOS: 12/11/2017, 10:37 AM   LOS: 0 days   Date of Service: The patient was seen and examined on 12/11/2017  Subjective:  Patient was seen and examined this morning, was agitated, restless. Yesterday and through the night patient continued to be restless, agitated, Haldol was added to his regimen. Still complaining of nausea vomiting has not be able to tolerate any oral intake yet per patient ----------------------------------------------------------------------------------------------------------------------  Brief Narrative:  [name] is a 47 y.o. male with history of alcohol abuse presents  with complaints of having tremors and loss consciousness.  Patient states he has been trying to cut down her alcohol and yesterday morning had an episode of nausea vomiting with no hematemesis.  On there day of admission he thinks he lost consciousness and fell onto the floor and may have remained so for some time.  Patient thinks he may have had a seizure because he has had seizures previously from alcohol withdrawal.  Following which after waking up patient was very tremulous and had generalized body aches.  Active Problems:   Alcohol abuse   Marijuana abuse   Delirium tremens (HCC)   Alcohol withdrawal (HCC)   Assessment & Plan:   Alcohol withdrawal  -Was quite agitated, restless yesterday afternoon Haldol was added to light him. -Cont. The patient On  CIWA protocol.   -We will closely monitor in telemetry. -still tachycardic   Nausea/vomiting -Per patient he has not had any oral intake since yesterday. -Morning of poor appetite.  Feeling of nausea but has not vomited  Fall -not sure if patient had a seizure.   - CT head was negative    Substance abuse - Marijuana abuse  - patient was advised to quite  -Arts development officersocial  worker consult.  EKG CT head troponin and CK levels are normal    DVT prophylaxis: SCDs for now until CT head results are available. Code Status: Full code. Family Communication: Discussed with patient. Disposition Plan:  Continue to monitor today, treat his withdrawal symptoms, if stable anticipating discharge in a.m. Consults called: None. Admission status: Observation.   Antimicrobials:  Anti-infectives (From admission, onward)   None      Objective: Vitals:   12/10/17 1801 12/10/17 1802 12/10/17 2134 12/11/17 0501  BP:  140/69 (!) 131/92 102/65  Pulse:  80 76 80  Resp: 18 18 17 16   Temp: 97.8 F (36.6 C) 97.8 F (36.6 C) 97.9 F (36.6 C) 97.9 F (36.6 C)  TempSrc: Oral Oral Oral Oral  SpO2:   96% 97%  Weight:  71.6 kg (157 lb 13.6 oz)    Height:  5\' 8"  (1.727 m)      Intake/Output Summary (Last 24 hours) at 12/11/2017 1037 Last data filed at 12/10/2017 2100 Gross per 24 hour  Intake 1135 ml  Output -  Net 1135 ml   Filed Weights   12/10/17 1802  Weight: 71.6 kg (157 lb 13.6 oz)  BP 102/65 (BP Location: Left Arm)   Pulse 80   Temp 97.9 F (36.6 C) (Oral)   Resp 16   Ht 5\' 8"  (1.727 m)   Wt 71.6  kg (157 lb 13.6 oz)   SpO2 97%   BMI 24.00 kg/m    Physical Exam  Constitution: Was able to awaken, agitated, restless Psychiatric: Normal and stable mood and affect, cognition intact,   HEENT: Normocephalic, PERRL, otherwise with in Normal limits  Cardio vascular:  S1/S2, RRR, No murmure, No Rubs or Gallops  Chest/pulmonary: Clear to auscultation bilaterally, respirations unlabored  Chest symmetric Abdomen: Soft, non-tender, non-distended, bowel sounds,no masses, no organomegaly Muscular skeletal: Limited exam - in bed, able to move all 4 extremities, Normal strength,  Neuro: CNII-XII intact. , normal motor and sensation, reflexes intact  Extremities: No pitting edema lower extremities, +2 pulses  Skin: Dry, warm to touch, negative for any Rashes, No open  wounds      Data Reviewed: I have personally reviewed following labs and imaging studies  CBC: Recent Labs  Lab 12/10/17 0131 12/11/17 0621  WBC 8.2 6.4  NEUTROABS 4.4  --   HGB 15.5 14.1  HCT 44.6 42.5  MCV 78.9 81.3  PLT 257 201   Basic Metabolic Panel: Recent Labs  Lab 12/10/17 0131 12/10/17 0514 12/11/17 0621  NA 139  --  141  K 3.9  --  3.8  CL 102  --  106  CO2 24  --  28  GLUCOSE 88  --  91  BUN 19  --  10  CREATININE 0.88  --  0.69  CALCIUM 8.9  --  8.6*  MG  --  1.6* 2.0   GFR: Estimated Creatinine Clearance: 112.8 mL/min (by C-G formula based on SCr of 0.69 mg/dL). Liver Function Tests: Recent Labs  Lab 12/10/17 0131  AST 48*  ALT 37  ALKPHOS 56  BILITOT 0.7  PROT 8.1  ALBUMIN 4.3   Recent Labs  Lab 12/10/17 0131  LIPASE 36   No results for input(s): AMMONIA in the last 168 hours. Coagulation Profile: No results for input(s): INR, PROTIME in the last 168 hours. Cardiac Enzymes: Recent Labs  Lab 12/10/17 0514  CKTOTAL 213  TROPONINI <0.03   Radiology Studies: Ct Head Wo Contrast  Result Date: 12/10/2017 CLINICAL DATA:  Altered mental status and seizures from alcohol withdrawal. Recent fall. EXAM: CT HEAD WITHOUT CONTRAST TECHNIQUE: Contiguous axial images were obtained from the base of the skull through the vertex without intravenous contrast. COMPARISON:  CT head dated August 30, 2017. FINDINGS: Brain: No evidence of acute infarction, hemorrhage, hydrocephalus, extra-axial collection or mass lesion/mass effect. Vascular: No hyperdense vessel or unexpected calcification. Skull: Normal. Negative for fracture or focal lesion. Sinuses/Orbits: No acute finding. Other: None. IMPRESSION: 1. Normal for age noncontrast head CT. Electronically Signed   By: Obie Dredge M.D.   On: 12/10/2017 07:44    Scheduled Meds: . folic acid  1 mg Intravenous Daily  . thiamine  100 mg Intravenous Daily   Continuous Infusions:   Time spent: >25  minutes  Kendell Bane, MD [facility],  Pager [phone]  If 7PM-7AM, please contact night-coverage [email or web address]   Password [facility]  12/11/2017, 10:37 AM

## 2017-12-12 DIAGNOSIS — F1010 Alcohol abuse, uncomplicated: Secondary | ICD-10-CM | POA: Diagnosis not present

## 2017-12-12 MED ORDER — ADULT MULTIVITAMIN W/MINERALS CH: 1.0000 | ORAL_TABLET | Freq: Every day | ORAL | 0 refills | Status: DC

## 2017-12-12 MED ORDER — THIAMINE HCL 100 MG PO TABS: 100.0000 mg | ORAL_TABLET | Freq: Every day | ORAL | 1 refills | Status: DC

## 2017-12-12 MED ORDER — CHLORDIAZEPOXIDE HCL 25 MG PO CAPS: ORAL_CAPSULE | ORAL | 0 refills | Status: DC

## 2017-12-12 NOTE — Progress Notes (Signed)
LCSW consulted for SA at discharge.   Patient refused services and did not want to participate in assessment. Patient reported he is familiar with area resources and thanked LCSw for her time.    LCSW signing off.  Beulah GandyBernette Jakyle Petrucelli, LSCW [facility] [phone]

## 2017-12-12 NOTE — Discharge Instructions (Signed)
Alcohol Withdrawal When a person who drinks a lot of alcohol stops drinking, he or she may go through alcohol withdrawal. Alcohol withdrawal causes problems. It can make you feel:  Tired (fatigued).  Sad (depressed).  Fearful (anxious).  Grouchy (irritable).  Not hungry.  Sick to your stomach (nauseous).  Shaky.  It can also make you have:  Nightmares.  Trouble sleeping.  Trouble thinking clearly.  Mood swings.  Clammy skin.  Very bad sweating.  A very fast heartbeat.  Shaking that you cannot control (tremor).  Having a fever.  A fit of movements that you cannot control (seizure).  Confusion.  Throwing up (vomiting).  Feeling or seeing things that are not there (hallucinations).  Follow these instructions at home:  Take medicines and vitamins only as told by your doctor.  Do not drink alcohol.  Have someone around in case you need help.  Drink enough fluid to keep your pee (urine) clear or pale yellow.  Think about joining a group to help you stop drinking. Contact a doctor if:  Your problems get worse.  Your problems do not go away.  You cannot eat or drink without throwing up.  You are having a hard time not drinking alcohol.  You cannot stop drinking alcohol. Get help right away if:  You feel your heart beating differently than usual.  Your chest hurts.  You have trouble breathing.  You have very bad problems, like: ? A fever. ? A fit of movements that you cannot control. ? Being very confused. ? Feeling or seeing things that are not there. This information is not intended to replace advice given to you by your health care provider. Make sure you discuss any questions you have with your health care provider. Document Released: 03/10/2008 Document Revised: 02/28/2016 Document Reviewed: 07/11/2014 Elsevier Interactive Patient Education  2018 Elsevier Inc.  

## 2017-12-12 NOTE — Discharge Summary (Signed)
[facility]  Physician Discharge Summary   Patient ID: [name] MRN: [id number] DOB/AGE: Mar 09, 1971 47 y.o.  Admit date: 12/10/2017 Discharge date: 12/12/2017  DISCHARGE DIAGNOSES:  Active Problems:   Alcohol abuse   Marijuana abuse   Delirium tremens (HCC)   Alcohol withdrawal (HCC)   EtOH dependence (HCC)   RECOMMENDATIONS FOR OUTPATIENT FOLLOW UP: 1. Patient declined outpatient resources and services for alcoholism   DISCHARGE CONDITION: fair  Diet recommendation: As before  [facility] Weights   12/10/17 1802  Weight: 71.6 kg (157 lb 13.6 oz)    INITIAL HISTORY: [name] a 47 y.o.malewithhistory of alcohol abuse presented with complaints of having tremors and loss consciousness. Patient states he has been trying to cut down her alcohol and the day prior to admission he had an episode of nausea vomiting with no hematemesis.  Patient was hospitalized for management of alcohol withdrawal.   HOSPITAL COURSE:   Alcohol withdrawal Patient was quite agitated and restless.  He required lorazepam along with Haldol.  This morning patient is adamant that he wants to go home.  He states that he feels much better.  Seems to understand his medical condition.  He is awake alert.  Oriented x3.  His friend is at the bedside.  No clear reason to hold him in the hospital.  Seems to be medically stable for discharge.  He will be discharged with prescription for Librium.  Nausea/vomiting Resolved  Fall Not sure if patient had a seizure. CT head was negative. Seizure-free during this hospital stay.  Substance abuse - Marijuana abuse Child psychotherapist consult.  Patient declines outpatient services.  Patient adamant about going home today.  Medically stable for discharge.    PERTINENT LABS:  The results of significant diagnostics from this hospitalization (including imaging, microbiology, ancillary and laboratory) are listed below for reference.       Labs: Basic Metabolic Panel: Recent Labs  Lab 12/10/17 0131 12/10/17 0514 12/11/17 0621  NA 139  --  141  K 3.9  --  3.8  CL 102  --  106  CO2 24  --  28  GLUCOSE 88  --  91  BUN 19  --  10  CREATININE 0.88  --  0.69  CALCIUM 8.9  --  8.6*  MG  --  1.6* 2.0   Liver Function Tests: Recent Labs  Lab 12/10/17 0131  AST 48*  ALT 37  ALKPHOS 56  BILITOT 0.7  PROT 8.1  ALBUMIN 4.3   Recent Labs  Lab 12/10/17 0131  LIPASE 36   CBC: Recent Labs  Lab 12/10/17 0131 12/11/17 0621  WBC 8.2 6.4  NEUTROABS 4.4  --   HGB 15.5 14.1  HCT 44.6 42.5  MCV 78.9 81.3  PLT 257 201   Cardiac Enzymes: Recent Labs  Lab 12/10/17 0514  CKTOTAL 213  TROPONINI <0.03    IMAGING STUDIES Ct Head Wo Contrast  Result Date: 12/10/2017 CLINICAL DATA:  Altered mental status and seizures from alcohol withdrawal. Recent fall. EXAM: CT HEAD WITHOUT CONTRAST TECHNIQUE: Contiguous axial images were obtained from the base of the skull through the vertex without intravenous contrast. COMPARISON:  CT head dated August 30, 2017. FINDINGS: Brain: No evidence of acute infarction, hemorrhage, hydrocephalus, extra-axial collection or mass lesion/mass effect. Vascular: No hyperdense vessel or unexpected calcification. Skull: Normal. Negative for fracture or focal lesion. Sinuses/Orbits: No acute finding. Other: None. IMPRESSION: 1. Normal for age noncontrast head CT. Electronically Signed   By: Chrissie Noa  Howell Pringle M.D.   On: 12/10/2017 07:44    DISCHARGE EXAMINATION: Vitals:   12/11/17 0501 12/11/17 1506 12/12/17 0130 12/12/17 0640  BP: 102/65 123/70 104/66 132/79  Pulse: 80 92 68 89  Resp: 16 16 17 18   Temp: 97.9 F (36.6 C) 98.3 F (36.8 C) 97.6 F (36.4 C) (!) 97.5 F (36.4 C)  TempSrc: Oral Oral Oral Oral  SpO2: 97% 96% 96% 97%  Weight:      Height:       General appearance: alert, cooperative, appears stated age and no distress Resp: clear to auscultation bilaterally Cardio:  regular rate and rhythm, S1, S2 normal, no murmur, click, rub or gallop GI: soft, non-tender; bowel sounds normal; no masses,  no organomegaly Extremities: extremities normal, atraumatic, no cyanosis or edema Neurologic: Alert and oriented X 3, normal strength and tone. Normal symmetric reflexes. Normal coordination and gait  DISPOSITION: Home  Discharge Instructions    Call MD for:  extreme fatigue   Complete by:  As directed    Call MD for:  persistant dizziness or light-headedness   Complete by:  As directed    Call MD for:  persistant nausea and vomiting   Complete by:  As directed    Call MD for:  severe uncontrolled pain   Complete by:  As directed    Call MD for:  temperature >100.4   Complete by:  As directed    Diet general   Complete by:  As directed    Discharge instructions   Complete by:  As directed    Please stay off of alcohol. Take your medications as prescribed.  You were cared for by a hospitalist during your hospital stay. If you have any questions about your discharge medications or the care you received while you were in the hospital after you are discharged, you can call the unit and asked to speak with the hospitalist on call if the hospitalist that took care of you is not available. Once you are discharged, your primary care physician will handle any further medical issues. Please note that NO REFILLS for any discharge medications will be authorized once you are discharged, as it is imperative that you return to your primary care physician (or establish a relationship with a primary care physician if you do not have one) for your aftercare needs so that they can reassess your need for medications and monitor your lab values. If you do not have a primary care physician, you can call [phone] for a physician referral.   Increase activity slowly   Complete by:  As directed        Allergies as of 12/12/2017      Reactions   Paxil [paroxetine Hcl] Hives       Medication List    STOP taking these medications   folic acid 1 MG tablet Commonly known as:  FOLVITE   nicotine 21 mg/24hr patch Commonly known as:  NICODERM CQ - dosed in mg/24 hours     TAKE these medications   chlordiazePOXIDE 25 MG capsule Commonly known as:  LIBRIUM Take 1 tablet three times daily for 3 days, then take 1 tablet twice daily for 3 days, then take 1 tablet once daily for 3 days, then STOP.   multivitamin with minerals Tabs tablet Take 1 tablet by mouth daily.   thiamine 100 MG tablet Take 1 tablet (100 mg total) by mouth daily.       TOTAL DISCHARGE TIME: 35 mins  Osvaldo ShipperGokul Kehinde Totzke  [facility] Pager [phone]  12/12/2017, 2:29 PM

## 2018-02-15 ENCOUNTER — Encounter (HOSPITAL_COMMUNITY): Payer: Self-pay | Admitting: Emergency Medicine

## 2018-02-15 ENCOUNTER — Emergency Department (HOSPITAL_COMMUNITY)
Admission: EM | Admit: 2018-02-15 | Discharge: 2018-02-16 | Disposition: A | Discharge status: Other Acute Inpatient Hospital | Payer: BLUE CROSS/BLUE SHIELD | Attending: Emergency Medicine | Admitting: Emergency Medicine

## 2018-02-15 DIAGNOSIS — F329 Major depressive disorder, single episode, unspecified: Secondary | ICD-10-CM

## 2018-02-15 DIAGNOSIS — F17210 Nicotine dependence, cigarettes, uncomplicated: Secondary | ICD-10-CM | POA: Insufficient documentation

## 2018-02-15 DIAGNOSIS — F32A Depression, unspecified: Secondary | ICD-10-CM

## 2018-02-15 DIAGNOSIS — F321 Major depressive disorder, single episode, moderate: Secondary | ICD-10-CM | POA: Insufficient documentation

## 2018-02-15 DIAGNOSIS — Z79899 Other long term (current) drug therapy: Secondary | ICD-10-CM | POA: Insufficient documentation

## 2018-02-15 DIAGNOSIS — F1020 Alcohol dependence, uncomplicated: Secondary | ICD-10-CM | POA: Insufficient documentation

## 2018-02-15 DIAGNOSIS — F4310 Post-traumatic stress disorder, unspecified: Secondary | ICD-10-CM | POA: Insufficient documentation

## 2018-02-15 DIAGNOSIS — F1010 Alcohol abuse, uncomplicated: Secondary | ICD-10-CM

## 2018-02-15 HISTORY — DX: Post-traumatic stress disorder, unspecified: F43.10

## 2018-02-15 LAB — CBC
HCT: 46.2 % (ref 39.0–52.0)
HEMOGLOBIN: 16.1 g/dL (ref 13.0–17.0)
MCH: 27.7 pg (ref 26.0–34.0)
MCHC: 34.8 g/dL (ref 30.0–36.0)
MCV: 79.4 fL (ref 78.0–100.0)
PLATELETS: 256 10*3/uL (ref 150–400)
RBC: 5.82 MIL/uL — AB (ref 4.22–5.81)
RDW: 14.5 % (ref 11.5–15.5)
WBC: 6.9 10*3/uL (ref 4.0–10.5)

## 2018-02-15 LAB — COMPREHENSIVE METABOLIC PANEL
ALT: 53 U/L (ref 17–63)
AST: 74 U/L — ABNORMAL HIGH (ref 15–41)
Albumin: 4.6 g/dL (ref 3.5–5.0)
Alkaline Phosphatase: 51 U/L (ref 38–126)
Anion gap: 12 (ref 5–15)
BUN: 7 mg/dL (ref 6–20)
CHLORIDE: 105 mmol/L (ref 101–111)
CO2: 26 mmol/L (ref 22–32)
Calcium: 9.1 mg/dL (ref 8.9–10.3)
Creatinine, Ser: 0.7 mg/dL (ref 0.61–1.24)
Glucose, Bld: 95 mg/dL (ref 65–99)
POTASSIUM: 3.9 mmol/L (ref 3.5–5.1)
SODIUM: 143 mmol/L (ref 135–145)
Total Bilirubin: 0.7 mg/dL (ref 0.3–1.2)
Total Protein: 8 g/dL (ref 6.5–8.1)

## 2018-02-15 LAB — ETHANOL: ALCOHOL ETHYL (B): 294 mg/dL — AB (ref <10)

## 2018-02-15 LAB — ACETAMINOPHEN LEVEL: Acetaminophen (Tylenol), Serum: <10 ug/mL — ABNORMAL LOW (ref 10–30)

## 2018-02-15 LAB — SALICYLATE LEVEL

## 2018-02-15 MED ORDER — LORAZEPAM 1 MG PO TABS
2.0000 mg | ORAL_TABLET | Freq: Once | ORAL | Status: AC
  Administered 2018-02-15: 2 mg via ORAL
  Filled 2018-02-15: qty 2

## 2018-02-15 NOTE — ED Notes (Signed)
Pt to NF states that he needs to be seen and he feels a seizure coming on

## 2018-02-15 NOTE — ED Notes (Signed)
Pt refusing to change into scrubs at this time 

## 2018-02-15 NOTE — ED Notes (Signed)
Pt in lobby yelling stating "I need to been seen, I served this country and everyone here, I should come first". Security in lobby, pt placed in peds consult room to calm down and await triage.

## 2018-02-15 NOTE — ED Triage Notes (Addendum)
Pt presents wishing to get assistance with alcohol withdrawal.  Pt reports drinking 18-20-30 beers daily for last 5 days.  Pt states he has 2 beers today.  Pt reports he is going to have a seizure "at any moment"  "I know my body", last etoh withdrawal related seizure 3-4 months ago.  Pt having continual twitching movements to entire body.  Pt reports he has PTSD from serving in the [employer].  Pt endorses SI with plan to drink himself to death.  Pt reports he is not taking prescribed medications " I do not want to take medication everyday"

## 2018-02-16 ENCOUNTER — Encounter (HOSPITAL_COMMUNITY): Payer: Self-pay | Admitting: *Deleted

## 2018-02-16 ENCOUNTER — Other Ambulatory Visit: Payer: Self-pay

## 2018-02-16 ENCOUNTER — Inpatient Hospital Stay (HOSPITAL_COMMUNITY)
Admission: AD | Admit: 2018-02-16 | Discharge: 2018-02-18 | DRG: 885 | Disposition: A | Discharge status: Home / Self Care | Payer: No Typology Code available for payment source | Source: Intra-hospital | Attending: Psychiatry | Admitting: Psychiatry

## 2018-02-16 DIAGNOSIS — F4310 Post-traumatic stress disorder, unspecified: Secondary | ICD-10-CM | POA: Diagnosis present

## 2018-02-16 DIAGNOSIS — Z811 Family history of alcohol abuse and dependence: Secondary | ICD-10-CM | POA: Diagnosis not present

## 2018-02-16 DIAGNOSIS — F10229 Alcohol dependence with intoxication, unspecified: Secondary | ICD-10-CM | POA: Diagnosis present

## 2018-02-16 DIAGNOSIS — R45851 Suicidal ideations: Secondary | ICD-10-CM | POA: Diagnosis present

## 2018-02-16 DIAGNOSIS — R451 Restlessness and agitation: Secondary | ICD-10-CM | POA: Diagnosis present

## 2018-02-16 DIAGNOSIS — F1290 Cannabis use, unspecified, uncomplicated: Secondary | ICD-10-CM | POA: Diagnosis not present

## 2018-02-16 DIAGNOSIS — F332 Major depressive disorder, recurrent severe without psychotic features: Secondary | ICD-10-CM | POA: Diagnosis not present

## 2018-02-16 DIAGNOSIS — G479 Sleep disorder, unspecified: Secondary | ICD-10-CM | POA: Diagnosis present

## 2018-02-16 DIAGNOSIS — F10239 Alcohol dependence with withdrawal, unspecified: Secondary | ICD-10-CM | POA: Diagnosis not present

## 2018-02-16 DIAGNOSIS — F322 Major depressive disorder, single episode, severe without psychotic features: Secondary | ICD-10-CM | POA: Diagnosis present

## 2018-02-16 DIAGNOSIS — F419 Anxiety disorder, unspecified: Secondary | ICD-10-CM | POA: Diagnosis present

## 2018-02-16 DIAGNOSIS — G4700 Insomnia, unspecified: Secondary | ICD-10-CM | POA: Diagnosis not present

## 2018-02-16 DIAGNOSIS — Y908 Blood alcohol level of 240 mg/100 ml or more: Secondary | ICD-10-CM | POA: Diagnosis present

## 2018-02-16 DIAGNOSIS — F17210 Nicotine dependence, cigarettes, uncomplicated: Secondary | ICD-10-CM | POA: Diagnosis not present

## 2018-02-16 LAB — RAPID URINE DRUG SCREEN, HOSP PERFORMED
Amphetamines: NOT DETECTED
BARBITURATES: NOT DETECTED
Benzodiazepines: NOT DETECTED
Cocaine: NOT DETECTED
Opiates: NOT DETECTED
Tetrahydrocannabinol: POSITIVE — AB

## 2018-02-16 MED ORDER — LORAZEPAM 1 MG PO TABS
1.0000 mg | ORAL_TABLET | Freq: Four times a day (QID) | ORAL | Status: DC | PRN
  Filled 2018-02-16: qty 1

## 2018-02-16 MED ORDER — LORAZEPAM 1 MG PO TABS
0.0000 mg | ORAL_TABLET | Freq: Four times a day (QID) | ORAL | Status: DC
  Administered 2018-02-16: 1 mg via ORAL
  Administered 2018-02-17: 2 mg via ORAL
  Administered 2018-02-17 (×2): 1 mg via ORAL
  Administered 2018-02-17: 2 mg via ORAL
  Administered 2018-02-18: 1 mg via ORAL
  Filled 2018-02-16: qty 2
  Filled 2018-02-16: qty 1
  Filled 2018-02-16: qty 2
  Filled 2018-02-16: qty 1

## 2018-02-16 MED ORDER — ADULT MULTIVITAMIN W/MINERALS CH
1.0000 | ORAL_TABLET | Freq: Every day | ORAL | Status: DC
  Administered 2018-02-17: 1 via ORAL
  Filled 2018-02-16 (×5): qty 1

## 2018-02-16 MED ORDER — VITAMIN B-1 100 MG PO TABS
100.0000 mg | ORAL_TABLET | Freq: Every day | ORAL | Status: DC
  Administered 2018-02-16: 100 mg via ORAL
  Filled 2018-02-16: qty 1

## 2018-02-16 MED ORDER — IBUPROFEN 400 MG PO TABS: 600.0000 mg | ORAL_TABLET | Freq: Three times a day (TID) | ORAL | Status: DC | PRN

## 2018-02-16 MED ORDER — FOLIC ACID 1 MG PO TABS
1.0000 mg | ORAL_TABLET | Freq: Every day | ORAL | Status: DC
  Administered 2018-02-17: 1 mg via ORAL
  Filled 2018-02-16 (×6): qty 1

## 2018-02-16 MED ORDER — LORAZEPAM 1 MG PO TABS
0.0000 mg | ORAL_TABLET | Freq: Four times a day (QID) | ORAL | Status: DC
  Administered 2018-02-16 (×3): 2 mg via ORAL
  Filled 2018-02-16 (×3): qty 2

## 2018-02-16 MED ORDER — PROMETHAZINE HCL 25 MG PO TABS
25.0000 mg | ORAL_TABLET | Freq: Once | ORAL | Status: AC
  Administered 2018-02-16: 25 mg via ORAL
  Filled 2018-02-16: qty 1

## 2018-02-16 MED ORDER — ADULT MULTIVITAMIN W/MINERALS CH: 1.0000 | ORAL_TABLET | Freq: Every day | ORAL | Status: DC

## 2018-02-16 MED ORDER — VITAMIN B-1 100 MG PO TABS
100.0000 mg | ORAL_TABLET | Freq: Every day | ORAL | Status: DC
  Administered 2018-02-17: 100 mg via ORAL
  Filled 2018-02-16 (×4): qty 1

## 2018-02-16 MED ORDER — LORAZEPAM 2 MG/ML IJ SOLN: 0.0000 mg | Freq: Two times a day (BID) | INTRAMUSCULAR | Status: DC

## 2018-02-16 MED ORDER — LORAZEPAM 1 MG PO TABS
1.0000 mg | ORAL_TABLET | Freq: Four times a day (QID) | ORAL | Status: DC | PRN
  Administered 2018-02-16: 1 mg via ORAL
  Filled 2018-02-16 (×2): qty 1

## 2018-02-16 MED ORDER — LORAZEPAM 2 MG/ML IJ SOLN: 0.0000 mg | Freq: Four times a day (QID) | INTRAMUSCULAR | Status: DC

## 2018-02-16 MED ORDER — LORAZEPAM 1 MG PO TABS: 0.0000 mg | ORAL_TABLET | Freq: Two times a day (BID) | ORAL | Status: DC

## 2018-02-16 MED ORDER — ALUM & MAG HYDROXIDE-SIMETH 200-200-20 MG/5ML PO SUSP: 30.0000 mL | Freq: Four times a day (QID) | ORAL | Status: DC | PRN

## 2018-02-16 MED ORDER — ONDANSETRON HCL 4 MG PO TABS
4.0000 mg | ORAL_TABLET | Freq: Three times a day (TID) | ORAL | Status: DC | PRN
  Administered 2018-02-16: 4 mg via ORAL
  Filled 2018-02-16: qty 1

## 2018-02-16 MED ORDER — THIAMINE HCL 100 MG/ML IJ SOLN: 100.0000 mg | Freq: Every day | INTRAMUSCULAR | Status: DC

## 2018-02-16 MED ORDER — IBUPROFEN 600 MG PO TABS
600.0000 mg | ORAL_TABLET | Freq: Four times a day (QID) | ORAL | Status: DC | PRN
  Administered 2018-02-16 – 2018-02-17 (×2): 600 mg via ORAL
  Filled 2018-02-16 (×2): qty 1

## 2018-02-16 MED ORDER — NICOTINE POLACRILEX 2 MG MT GUM
2.0000 mg | CHEWING_GUM | OROMUCOSAL | Status: DC | PRN
  Administered 2018-02-16 – 2018-02-18 (×8): 2 mg via ORAL
  Filled 2018-02-16: qty 1

## 2018-02-16 MED ORDER — LORAZEPAM 2 MG/ML IJ SOLN: 1.0000 mg | Freq: Four times a day (QID) | INTRAMUSCULAR | Status: DC | PRN

## 2018-02-16 MED ORDER — HYDROXYZINE HCL 25 MG PO TABS
25.0000 mg | ORAL_TABLET | Freq: Four times a day (QID) | ORAL | Status: DC | PRN
  Administered 2018-02-16 – 2018-02-18 (×5): 25 mg via ORAL
  Filled 2018-02-16 (×4): qty 1
  Filled 2018-02-16: qty 10
  Filled 2018-02-16: qty 1

## 2018-02-16 MED ORDER — VITAMIN B-1 100 MG PO TABS
100.0000 mg | ORAL_TABLET | Freq: Every day | ORAL | Status: DC
  Filled 2018-02-16 (×2): qty 1

## 2018-02-16 MED ORDER — LOPERAMIDE HCL 2 MG PO CAPS: 2.0000 mg | ORAL_CAPSULE | ORAL | Status: DC | PRN

## 2018-02-16 MED ORDER — NICOTINE 21 MG/24HR TD PT24
21.0000 mg | MEDICATED_PATCH | Freq: Every day | TRANSDERMAL | Status: DC
  Filled 2018-02-16: qty 1

## 2018-02-16 MED ORDER — ONDANSETRON 4 MG PO TBDP
4.0000 mg | ORAL_TABLET | Freq: Four times a day (QID) | ORAL | Status: DC | PRN
  Administered 2018-02-16: 4 mg via ORAL
  Filled 2018-02-16: qty 1

## 2018-02-16 NOTE — Tx Team (Signed)
Initial Treatment Plan 02/16/2018 4:53 PM [name] [id number]    PATIENT STRESSORS: Marital or family conflict Substance abuse   PATIENT STRENGTHS: Ability for insight Average or above average intelligence Capable of independent living Financial means General fund of knowledge   PATIENT IDENTIFIED PROBLEMS: Depression Suicidal thoughts Alcohol Abuse "I just need to get detoxed"                     DISCHARGE CRITERIA:  Ability to meet basic life and health needs Improved stabilization in mood, thinking, and/or behavior Verbal commitment to aftercare and medication compliance Withdrawal symptoms are absent or subacute and managed without 24-hour nursing intervention  PRELIMINARY DISCHARGE PLAN: Attend aftercare/continuing care group Return to previous living arrangement  PATIENT/FAMILY INVOLVEMENT: This treatment plan has been presented to and reviewed with the patient, [name], and/or family member, .  The patient and family have been given the opportunity to ask questions and make suggestions.  Mehak Roskelley, [place], [place] 02/16/2018, 4:53 PM

## 2018-02-16 NOTE — [facility] Group Notes (Addendum)
Patient ID: KYLER GERMER, male   DOB: 1971/07/25, 47 y.o.   MRN: [id number]  Adult Psychoeducational Group Note  Date:  02/16/2018 Time:  1600  Group Topic/Focus: Recovery, Support Systems, Mindfulness  Recovery Goals:   The focus of this group is to identify appropriate goals for recovery and establish a plan to achieve them.  Participation Level:  Active  Participation Quality:  Appropriate, Sharing and Supportive  Affect:  Appropriate  Cognitive:  Alert and Oriented  Insight: Appropriate, Improving  Engagement in Group:  Engaged  Modes of Intervention:  Discussion, Education, Socialization and Support  Additional Comments:  Patient shared about his struggles with staying sober and the importance of setting boundaries with others who may impact relapse. Patient demonstrated good insight and was supportive of peers.  Rae Lips Nadia Torr 02/16/2018, 6:15 PM

## 2018-02-16 NOTE — ED Notes (Signed)
Pt now agreeable to go to [facility]. Will arrange transport after ativan and CIWA reassessment.

## 2018-02-16 NOTE — ED Notes (Signed)
Explained to patient he has been accepted at [facility] Pt states he doesn't want to go there. Wants to sign out if possible. Notified Dr. Criss Alvine of same.

## 2018-02-16 NOTE — ED Notes (Addendum)
Pt denies SI/HI. Pt just states that he is okay as long as "we keep the drink out of his hand." Pt states that he can "feel it coming, but you better watch me b/c sometimes I seize when Im coming down."

## 2018-02-16 NOTE — Progress Notes (Signed)
Pt accepted to [facility], Bed 300-2  Dr. Greer Ee is the accepting provider.  Dr. Jola Babinski is the attending provider.  Call report to [phone]  @ [facility] ED notified.   Pt is Voluntary.  Pt may be transported by [facility]  Pt scheduled  to arrive at [facility] at 10AM.

## 2018-02-16 NOTE — ED Provider Notes (Signed)
[facility] EMERGENCY DEPARTMENT Provider Note   CSN: [id number] Arrival date & time: 02/15/18  2001     History   Chief Complaint Chief Complaint  Patient presents with  . Alcohol Problem    HPI [name] is a 47 y.o. male.  The history is provided by the patient and medical records.  Alcohol Problem      47 y.o. M with hx of alcohol abuse, anxiety, PTSD, bipolar disorder, depression, presenting to the ED requesting psychiatric help.  Patient reports lately he has been feeling down.  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  States he is not sure why he started thinking of them again.  States because of this he has been drinking a lot, approx 36 beers daily for the past 4 days.  States he has been trying to drink himself to death.  He states he has not had any personal issues going on to cause him to feel that way necessarily.  He denies any illicit drug use.  States he has been having some hallucinations recently, does not feel it is related to his drinking.  When quesitoned about homicidal hallucinations, states " I am not sure how to answer that".    Past Medical History:  Diagnosis Date  . Active smoker   . Alcohol abuse   . Anxiety   . Bipolar 2 disorder (HCC)   . Depression   . PTSD (post-traumatic stress disorder)     Patient Active Problem List   Diagnosis Date Noted  . EtOH dependence (HCC) 12/11/2017  . Alcohol withdrawal (HCC) 12/10/2017  . Seizure due to alcohol withdrawal (HCC) 08/30/2017  . Alcohol withdrawal seizure (HCC) 08/30/2017  . Tobacco abuse   . Delirium tremens (HCC) 03/02/2017  . GI bleed 10/06/2013  . Marijuana abuse 10/06/2013  . Suicidal ideation 10/05/2013  . Alcohol abuse 10/26/2011  . Depression 10/26/2011    Past Surgical History:  Procedure Laterality Date  . LYMPH NODE DISSECTION          Home Medications    Prior to Admission  medications   Medication Sig Start Date End Date Taking? Authorizing Provider  chlordiazePOXIDE (LIBRIUM) 25 MG capsule Take 1 tablet three times daily for 3 days, then take 1 tablet twice daily for 3 days, then take 1 tablet once daily for 3 days, then STOP. 12/12/17   Osvaldo Shipper, MD  Multiple Vitamin (MULTIVITAMIN WITH MINERALS) TABS tablet Take 1 tablet by mouth daily. 12/12/17   Osvaldo Shipper, MD  thiamine 100 MG tablet Take 1 tablet (100 mg total) by mouth daily. 12/12/17   Osvaldo Shipper, MD    Family History Family History  Problem Relation Age of Onset  . Alcohol abuse Father     Social History Social History   Tobacco Use  . Smoking status: Current Every Day Smoker    Packs/day: 2.00    Years: 20.00    Pack years: 40.00    Types: Cigarettes  . Smokeless tobacco: Never Used  Substance Use Topics  . Alcohol use: Yes    Alcohol/week: 151.2 oz    Types: 252 Cans of beer per week  . Drug use: Yes    Types: Marijuana     Allergies   Paxil [paroxetine hcl]   Review of Systems Review of Systems  Psychiatric/Behavioral: Positive for suicidal ideas.  All other systems reviewed and are negative.  Physical Exam Updated Vital Signs BP (!) 132/115 (BP Location: Right Arm)   Pulse 90   Temp 97.7 F (36.5 C) (Oral)   Resp (!) 24   Ht  (1.803 m)   Wt 79.4 kg (175 lb)   SpO2 99%   BMI 24.41 kg/m   Physical Exam  Constitutional: He is oriented to person, place, and time. He appears well-developed and well-nourished.  HENT:  Head: Normocephalic and atraumatic.  Mouth/Throat: Oropharynx is clear and moist.  Eyes: Pupils are equal, round, and reactive to light. Conjunctivae and EOM are normal.  Neck: Normal range of motion.  Cardiovascular: Normal rate, regular rhythm and normal heart sounds.  Pulmonary/Chest: Effort normal and breath sounds normal. No stridor. No respiratory distress.  Abdominal: Soft. Bowel sounds are normal. There is no tenderness.  There is no rebound.  Musculoskeletal: Normal range of motion.  Neurological: He is alert and oriented to person, place, and time.  Skin: Skin is warm and dry.  Psychiatric: He exhibits a depressed mood. He expresses suicidal ideation. He expresses suicidal plans.  Nursing note and vitals reviewed.    ED Treatments / Results  Labs (all labs ordered are listed, but only abnormal results are displayed) Labs Reviewed  COMPREHENSIVE METABOLIC PANEL - Abnormal; Notable for the following components:      Result Value   AST 74 (*)    All other components within normal limits  ETHANOL - Abnormal; Notable for the following components:   Alcohol, Ethyl (B) 294 (*)    All other components within normal limits  ACETAMINOPHEN LEVEL - Abnormal; Notable for the following components:   Acetaminophen (Tylenol), Serum <10 (*)    All other components within normal limits  CBC - Abnormal; Notable for the following components:   RBC 5.82 (*)    All other components within normal limits  RAPID URINE DRUG SCREEN, HOSP PERFORMED - Abnormal; Notable for the following components:   Tetrahydrocannabinol POSITIVE (*)    All other components within normal limits  SALICYLATE LEVEL    EKG None  Radiology No results found.  Procedures Procedures (including critical care time)  Medications Ordered in ED Medications  LORazepam (ATIVAN) tablet 2 mg (2 mg Oral Given 02/15/18 2237)     Initial Impression / Assessment and Plan / ED Course  I have reviewed the triage vital signs and the nursing notes.  Pertinent labs & imaging results that were available during my care of the patient were reviewed by me and considered in my medical decision making (see chart for details).  47 year old male presenting to the ED with depression and issues with alcohol.  Has been having flashbacks from the war, does have documented history of PTSD.  Has also had some intermittent hallucinations.  He is afebrile and  nontoxic.  No physical complaints at this time.  He does not appear tremulous.  Has not had any nausea or vomiting.  No seizure activity.  Does not appear to be clinically within withdrawal, however given his alcohol abuse, will place on CIWA protocol.  Labs drawn on arrival, overall reassuring.  Ethanol 294.  Patient clinically sober by time of my evaluation, able to answer questions and follow commands without issue.  Will get TTS consult.  TTS has evaluated, recommends IP placement.  Holding orders in place, patient remains stable.  Awaiting bed assignment.  Final Clinical Impressions(s) / ED Diagnoses   Final diagnoses:  Depression, unspecified depression type  Alcohol abuse    ED  Discharge Orders    None       Garlon Hatchet, PA-C 02/16/18 0535    Shon Baton, MD 02/17/18 [phone]

## 2018-02-16 NOTE — Progress Notes (Signed)
Patient did attend the evening speaker AA meeting.  

## 2018-02-16 NOTE — Progress Notes (Signed)
Nursing Progress Note: 7p-7a D: Pt currently presents with an anxious/depressed/agitated/confused/irritable affect and behavior. Pt states "I need to go home tomorrow. Can we just bump the taper up though? I can take double doses, so I can leave tomorrow." Interacting minimally with the milieu. Pt did attend wrap-up group.  A: Pt provided with medications per providers orders. Pt's labs and vitals were monitored throughout the night. Pt supported emotionally and encouraged to express concerns and questions. Pt educated on medications.  R: Pt's safety ensured with 15 minute and environmental checks. Pt currently denies SI, HI, and AVH. Pt verbally contracts to seek staff if SI,HI, or AVH occurs and to consult with staff before acting on any harmful thoughts. Will continue to monitor.
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
## 2018-02-16 NOTE — Progress Notes (Signed)
Patient ID: [name], male   DOB: June 23, 1971, 47 y.o.   MRN: [id number]  Nursing Progress Note [id number]  Data: Patient is adjusting to the unit. Patient reports he does not feel he needs to be here and has been "bamboozled" into coming. Patient signs 72 hr request for discharge. Patient does endorse withdrawal symptoms and asks repeatedly for his next dose of Ativan due at 1800. CIWA score this evening was a 7.  Patient presents with anxious mood but is calm and cooperative with staff. Patient complaint with scheduled medications. Patient denies SI/HI/AVH or pain. Patient contracts for safety on the unit at this time. Patient has been attending groups and is visible on the unit.  Action: Patient educated about and provided medication per provider's orders. Patient safety maintained with q15 min safety checks. Low fall risk precautions in place. Emotional support given. 1:1 interaction and active listening provided. Patient encouraged to attend meals and groups. Labs, vital signs and patient behavior monitored throughout shift. Patient encouraged to work on treatment plan and goals.  Response: Patient remains safe on the unit at this time. Patient is interacting with peers appropriately on the unit. Will continue to support and monitor.

## 2018-02-16 NOTE — Progress Notes (Signed)
[name] is a 47 year old male pt admitted on voluntary basis. On admission, [name] appears fidgety and anxious and spoke about how he has been having some issues with his girlfriend and that he drank 5 cases a beer in the past 5 days. He reports that he is not on any medications and reports that he has been hospitalized before and reports it was for alcohol in the past. He reports that he has his own place and lives alone and reports that he will return there after discharge. [name] was oriented to the unit and safety maintained.

## 2018-02-16 NOTE — ED Provider Notes (Signed)
Patient refusing to go to behavioral health.  When I talked the patient, he is not suicidal or homicidal.  He states all he wanted was detox.  When I discussed that that is what they are doing at the facility, he agrees to go to the facility.  He is voluntary.  We discussed that it can be very dangerous to try and detox on his own which was his other plan.   Pricilla Loveless, MD 02/16/18 1254

## 2018-02-16 NOTE — ED Notes (Signed)
[place] transportation notified of ready bed at [facility].

## 2018-02-17 DIAGNOSIS — F10239 Alcohol dependence with withdrawal, unspecified: Secondary | ICD-10-CM

## 2018-02-17 DIAGNOSIS — F419 Anxiety disorder, unspecified: Secondary | ICD-10-CM

## 2018-02-17 DIAGNOSIS — F1290 Cannabis use, unspecified, uncomplicated: Secondary | ICD-10-CM

## 2018-02-17 DIAGNOSIS — F332 Major depressive disorder, recurrent severe without psychotic features: Secondary | ICD-10-CM | POA: Diagnosis present

## 2018-02-17 DIAGNOSIS — F4310 Post-traumatic stress disorder, unspecified: Secondary | ICD-10-CM

## 2018-02-17 DIAGNOSIS — G4700 Insomnia, unspecified: Secondary | ICD-10-CM

## 2018-02-17 DIAGNOSIS — F17210 Nicotine dependence, cigarettes, uncomplicated: Secondary | ICD-10-CM

## 2018-02-17 DIAGNOSIS — R45851 Suicidal ideations: Secondary | ICD-10-CM

## 2018-02-17 MED ORDER — LORAZEPAM 1 MG PO TABS
ORAL_TABLET | ORAL | Status: AC
  Filled 2018-02-17: qty 2

## 2018-02-17 MED ORDER — GABAPENTIN 300 MG PO CAPS
300.0000 mg | ORAL_CAPSULE | Freq: Three times a day (TID) | ORAL | Status: DC
  Administered 2018-02-17 – 2018-02-18 (×2): 300 mg via ORAL
  Filled 2018-02-17 (×9): qty 1

## 2018-02-17 MED ORDER — LORAZEPAM 1 MG PO TABS
2.0000 mg | ORAL_TABLET | ORAL | Status: AC
  Administered 2018-02-17: 2 mg via ORAL

## 2018-02-17 MED ORDER — TRAZODONE HCL 50 MG PO TABS
50.0000 mg | ORAL_TABLET | Freq: Every evening | ORAL | Status: DC | PRN
  Administered 2018-02-17: 50 mg via ORAL
  Filled 2018-02-17: qty 7
  Filled 2018-02-17: qty 1

## 2018-02-17 NOTE — Plan of Care (Signed)
  Problem: Safety: Goal: Periods of time without injury will increase Outcome: Progressing Note:  Pt has not harmed self or others tonight.  He denies SI/HI and verbally contracts for safety.   

## 2018-02-17 NOTE — Progress Notes (Signed)
D: Pt was in his room with visitor upon initial approach.  He presents with anxious affect and mood.  His goal is to "try to sleep tonight."  He reports he is "very stressed out" because his girlfriend is driving to [place].  He reports withdrawal symptoms of headache of 10/10, tremors, anxiety.  Denies SI/HI and hallucinations.  Visible in milieu and interacts appropriately with peers and staff.  A: Introduced self to pt and offered support and encouragement.  Medication administered per order.  PRN medications administered for pain, anxiety, and sleep.  Q15 minute safety checks maintained.  R: Pt is compliant with medications.  He verbally contracts for safety and reports he will inform staff of needs and concerns.  Will continue to monitor and assess.

## 2018-02-17 NOTE — Plan of Care (Signed)
Nurse discussed depression, anxiety, coping skills with patient.  

## 2018-02-17 NOTE — Progress Notes (Signed)
D:  Patient's self inventory sheet, patient sleeps good, no sleep medication given.  Good appetite, normal energy level, good concentration.  Denied depression, hopeless and anxiety.  Denied withdrawals.  Denied SI.  Denied physical problems.  Denied physical pain.  Goal is discharge.  Plans to do anything needed.  Goal is talk discharge with MD.  Plans to continue AA meetings. A:  Medications administered per MD orders.  Emotional support and encouragement given patient. R:  Denied SI and HI, contracts for safety.  Denied A/V hallucinations.  Safety maintained with 15 minute checks.

## 2018-02-17 NOTE — [facility] Group Notes (Signed)
[facility] Group Notes:  (Nursing/MHT/Case Management/Adjunct)  Date:  02/17/2018  Time:  4:15 pm  Type of Therapy:  Psychoeducational Skills  Participation Level:  Active  Participation Quality:  Appropriate  Affect:  Appropriate  Cognitive:  Appropriate  Insight:  Appropriate  Engagement in Group:  Engaged  Modes of Intervention:  Education  Summary of Progress/Problems: Patient alert and appropriate engaged in group.   [name] 02/17/2018, 5:03 PM
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
## 2018-02-17 NOTE — Tx Team (Signed)
Interdisciplinary Treatment and Diagnostic Plan Update  02/17/2018 Time of Session: 0830AM [name] MRN: [id number]  Principal Diagnosis: MDD, recurrent, severe  Secondary Diagnoses: Active Problems:   MDD (major depressive disorder), severe (HCC)   Current Medications:  Current Facility-Administered Medications  Medication Dose Route Frequency Provider Last Rate Last Dose  . folic acid (FOLVITE) tablet 1 mg  1 mg Oral Daily [name], NP   1 mg at 02/17/18 0806  . hydrOXYzine (ATARAX/VISTARIL) tablet 25 mg  25 mg Oral Q6H PRN Rankin, Shuvon B, NP   25 mg at 02/17/18 0809  . ibuprofen (ADVIL,MOTRIN) tablet 600 mg  600 mg Oral Q6H PRN [name], PA-C   600 mg at 02/16/18 2057  . loperamide (IMODIUM) capsule 2-4 mg  2-4 mg Oral PRN Rankin, Shuvon B, NP      . LORazepam (ATIVAN) tablet 1 mg  1 mg Oral Q6H PRN [name], NP   1 mg at 02/16/18 1830   Or  . LORazepam (ATIVAN) injection 1 mg  1 mg Intravenous Q6H PRN [name], NP      . LORazepam (ATIVAN) tablet 0-4 mg  0-4 mg Oral Q6H [name], NP   2 mg at 02/17/18 0618   Followed by  . [START ON 02/18/2018] LORazepam (ATIVAN) tablet 0-4 mg  0-4 mg Oral Q12H [name], NP      . multivitamin with minerals tablet 1 tablet  1 tablet Oral Daily Rankin, Shuvon B, NP   1 tablet at 02/17/18 0806  . nicotine polacrilex (NICORETTE) gum 2 mg  2 mg Oral PRN Cobos, Myer Peer, MD   2 mg at 02/17/18 0807  . ondansetron (ZOFRAN-ODT) disintegrating tablet 4 mg  4 mg Oral Q6H PRN Rankin, Shuvon B, NP   4 mg at 02/16/18 1531  . thiamine (VITAMIN B-1) tablet 100 mg  100 mg Oral Daily [name], NP   100 mg at 02/17/18 1916   Or  . thiamine (B-1) injection 100 mg  100 mg Intravenous Daily [name], NP       PTA Medications: Medications Prior to Admission  Medication Sig Dispense Refill Last Dose  . chlordiazePOXIDE (LIBRIUM) 25 MG capsule Take 1 tablet three times daily for 3 days, then take 1  tablet twice daily for 3 days, then take 1 tablet once daily for 3 days, then STOP. (Patient not taking: Reported on 02/16/2018) 18 capsule 0 Not Taking at Unknown time  . Multiple Vitamin (MULTIVITAMIN WITH MINERALS) TABS tablet Take 1 tablet by mouth daily. (Patient not taking: Reported on 02/16/2018) 30 tablet 0 Not Taking at Unknown time  . thiamine 100 MG tablet Take 1 tablet (100 mg total) by mouth daily. (Patient not taking: Reported on 02/16/2018) 30 tablet 1 Not Taking at Unknown time    Patient Stressors: Marital or family conflict Substance abuse  Patient Strengths: Ability for insight Average or above average intelligence Capable of independent living Facilities manager fund of knowledge  Treatment Modalities: Medication Management, Group therapy, Case management,  1 to 1 session with clinician, Psychoeducation, Recreational therapy.   Physician Treatment Plan for Primary Diagnosis:MDD, recurrent, severe  Medication Management: Evaluate patient's response, side effects, and tolerance of medication regimen.  Therapeutic Interventions: 1 to 1 sessions, Unit Group sessions and Medication administration.  Evaluation of Outcomes: Not Met  Physician Treatment Plan for Secondary Diagnosis: Active Problems:   MDD (major depressive disorder), severe ([place])  Medication Management: Evaluate patient's response, side effects, and tolerance of medication regimen.  Therapeutic Interventions: 1 to 1 sessions, Unit Group sessions and Medication administration.  Evaluation of Outcomes: Not Met   RN Treatment Plan for Primary Diagnosis: MDD, recurrent, severe Long Term Goal(s): Knowledge of disease and therapeutic regimen to maintain health will improve  Short Term Goals: Ability to remain free from injury will improve, Ability to demonstrate self-control, Ability to participate in decision making will improve and Ability to identify and develop effective coping behaviors will  improve  Medication Management: RN will administer medications as ordered by provider, will assess and evaluate patient's response and provide education to patient for prescribed medication. RN will report any adverse and/or side effects to prescribing provider.  Therapeutic Interventions: 1 on 1 counseling sessions, Psychoeducation, Medication administration, Evaluate responses to treatment, Monitor vital signs and CBGs as ordered, Perform/monitor CIWA, COWS, AIMS and Fall Risk screenings as ordered, Perform wound care treatments as ordered.  Evaluation of Outcomes: Not Met   LCSW Treatment Plan for Primary Diagnosis:MDD, recurrent, severe Long Term Goal(s): Safe transition to appropriate next level of care at discharge, Engage patient in therapeutic group addressing interpersonal concerns.  Short Term Goals: Engage patient in aftercare planning with referrals and resources, Facilitate patient progression through stages of change regarding substance use diagnoses and concerns and Identify triggers associated with mental health/substance abuse issues  Therapeutic Interventions: Assess for all discharge needs, 1 to 1 time with Social worker, Explore available resources and support systems, Assess for adequacy in community support network, Educate family and significant other(s) on suicide prevention, Complete Psychosocial Assessment, Interpersonal group therapy.  Evaluation of Outcomes: Not Met   Progress in Treatment: Attending groups: No. New to unit. Continuing to assess.  Participating in groups: No. Taking medication as prescribed: Yes. Toleration medication: Yes. Family/Significant other contact made: No, will contact:  family member for collateral contact and SPE if pt consents.  Patient understands diagnosis: Yes. Minimal insight however.  Discussing patient identified problems/goals with staff: Yes. Medical problems stabilized or resolved: Yes. Denies suicidal/homicidal ideation:  Yes. Issues/concerns per patient self-inventory: No. Other: n/a   New problem(s) identified: No, Describe:  n/a  New Short Term/Long Term Goal(s): detox, medication management for mood stabilization; elimination of SI thoughts; development of comprehensive mental wellness/sobriety plan.   Patient: "My goal is to discharge and get back home."   Discharge Plan or Barriers: Pt has no current outpatient providers. CSW assessing for appropriate referrals. [place] pamphlet and AA/NA information to be provided for additional community support.   Reason for Continuation of Hospitalization: Anxiety Depression Medication stabilization Withdrawal symptoms  Estimated Length of Stay: Friday, 02/19/18  Attendees: Patient: [name]  02/17/2018 9:00 AM  Physician: Dr. Parke Poisson MD; Dr. Nancy Fetter MD 02/17/2018 9:00 AM  Nursing: Rise Paganini RN; Jinny Sanders RN 02/17/2018 9:00 AM  RN Care Manager:x 02/17/2018 9:00 AM  Social Worker: Press photographer, LCSW 02/17/2018 9:00 AM  Recreational Therapist: x 02/17/2018 9:00 AM  Other: Lindell Spar NP; Waylan Boga NP 02/17/2018 9:00 AM  Other:  02/17/2018 9:00 AM  Other: 02/17/2018 9:00 AM    Scribe for Treatment Team: Kimber Relic Smart, LCSW 02/17/2018 9:00 AM

## 2018-02-17 NOTE — Progress Notes (Signed)
Recreation Therapy Notes  Date: 5.15.19 Time: 0930 Location: [address] Dayroom  Group Topic: Stress Management  Goal Area(s) Addresses:  Patient will verbalize importance of using healthy stress management.  Patient will identify positive emotions associated with healthy stress management.   Behavioral Response: Engaged  Intervention: Stress Management  Activity :  Meditation.  LRT played a meditation on being resilient in the face of adversity.  Patients were to follow along as the meditation played.   Education:  Stress Management, Discharge Planning.   Education Outcome: Acknowledges edcuation/In group clarification offered/Needs additional education  Clinical Observations/Feedback: Pt attended and participated in group.    Caroll Rancher, LRT/CTRS         Lillia Abed, Zynasia Burklow A 02/17/2018 10:59 AM
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
## 2018-02-17 NOTE — Progress Notes (Signed)
The patient attended the evening A.A.meeting and was appropriate.  

## 2018-02-17 NOTE — BHH Group Notes (Signed)
LCSW Group Therapy Note  02/17/2018 1:15pm  Type of Therapy and Topic:  Group Therapy: Avoiding Self-Sabotaging and Enabling Behaviors  Participation Level:  Did Not Attend--pt invited. Chose to remain in hallway to wait for medication.   Description of Group:   In this group, patients will learn how to identify obstacles, self-sabotaging and enabling behaviors, as well as: what are they, why do we do them and what needs these behaviors meet. Discuss unhealthy relationships and how to have positive healthy boundaries with those that sabotage and enable. Explore aspects of self-sabotage and enabling in yourself and how to limit these self-destructive behaviors in everyday life.   Therapeutic Goals: 1. Patient will identify one obstacle that relates to self-sabotage and enabling behaviors 2. Patient will identify one personal self-sabotaging or enabling behavior they did prior to admission 3. Patient will state a plan to change the above identified behavior 4. Patient will demonstrate ability to communicate their needs through discussion and/or role play.   Summary of Patient Progress:   x  Therapeutic Modalities:   Cognitive Behavioral Therapy Person-Centered Therapy Motivational Interviewing   [employer], LCSW 02/17/2018 2:42 PM

## 2018-02-17 NOTE — BHH Suicide Risk Assessment (Signed)
[facility] INPATIENT:  Family/Significant Other Suicide Prevention Education  Suicide Prevention Education:  Patient Refusal for Family/Significant Other Suicide Prevention Education: The patient [name] has refused to provide written consent for family/significant other to be provided Family/Significant Other Suicide Prevention Education during admission and/or prior to discharge.  Physician notified.  Lorri Frederick, LCSW 02/17/2018, 3:24 PM

## 2018-02-17 NOTE — BHH Group Notes (Signed)
Adult Psychoeducational Group Note  Date:  02/17/2018 Time:  9:39 AM  Group Topic/Focus:  Goals Group:   The focus of this group is to help patients establish daily goals to achieve during treatment and discuss how the patient can incorporate goal setting into their daily lives to aide in recovery.  Participation Level:  Active  Participation Quality:  Appropriate  Affect:  Appropriate  Cognitive:  Alert  Insight: Appropriate  Engagement in Group:  Engaged  Modes of Intervention:  Orientation  Additional Comments:   Pt attended and participated in orientation/goals group. Pt goal for today is to do a little more praying. Pt was offered a bible if needed.   Dellia Nims 02/17/2018, 9:39 AM

## 2018-02-18 DIAGNOSIS — R451 Restlessness and agitation: Secondary | ICD-10-CM

## 2018-02-18 DIAGNOSIS — R450 Nervousness: Secondary | ICD-10-CM

## 2018-02-18 DIAGNOSIS — Y908 Blood alcohol level of 240 mg/100 ml or more: Secondary | ICD-10-CM

## 2018-02-18 DIAGNOSIS — Z811 Family history of alcohol abuse and dependence: Secondary | ICD-10-CM

## 2018-02-18 MED ORDER — NICOTINE POLACRILEX 2 MG MT GUM: 2.0000 mg | CHEWING_GUM | OROMUCOSAL | 0 refills | Status: DC | PRN

## 2018-02-18 MED ORDER — GABAPENTIN 300 MG PO CAPS: 300.0000 mg | ORAL_CAPSULE | Freq: Three times a day (TID) | ORAL | 0 refills | Status: DC

## 2018-02-18 MED ORDER — TRAZODONE HCL 50 MG PO TABS: 50.0000 mg | ORAL_TABLET | Freq: Every evening | ORAL | 0 refills | Status: DC | PRN

## 2018-02-18 MED ORDER — HYDROXYZINE HCL 25 MG PO TABS: ORAL_TABLET | ORAL | 0 refills | Status: DC

## 2018-02-18 NOTE — Progress Notes (Signed)
D: Patient states his goal today is to "get out of here." Patient states he is stressed this morning due to his girlfriend driving a long distance and not answering his phone call. Patient slept well last night, good appetite, normal energy level, good concentration. Depression is rated 0, hopelessness 0, and anxiety is 3 out of 10. Denies SI/HI/AVH. Denies physical pain. Patient is preoccupied with discharge.  A: Patient denied all morning medication except for nicotine gum, which he then asked for again 30 minutes after administration. Safety maintained with 15 minute checks.   R: Pt verbally contracts for safety. Will continue to monitor.
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
## 2018-02-18 NOTE — [facility] Suicide Risk Assessment (Signed)
[facility] Discharge Suicide Risk Assessment   Principal Problem: MDD (major depressive disorder), recurrent severe, without psychosis (HCC) Discharge Diagnoses:  Patient Active Problem List   Diagnosis Date Noted  . MDD (major depressive disorder), recurrent severe, without psychosis (HCC) [F33.2] 02/17/2018  . EtOH dependence (HCC) [F10.20] 12/11/2017  . Alcohol withdrawal (HCC) [F10.239] 12/10/2017  . Seizure due to alcohol withdrawal (HCC) [J19.147, R56.9] 08/30/2017  . Alcohol withdrawal seizure (HCC) [W29.562, R56.9] 08/30/2017  . Tobacco abuse [Z72.0]   . Delirium tremens (HCC) [F10.231] 03/02/2017  . GI bleed [K92.2] 10/06/2013  . Marijuana abuse [F12.10] 10/06/2013  . Suicidal ideation [R45.851] 10/05/2013  . Alcohol abuse [F10.10] 10/26/2011  . Depression [F32.9] 10/26/2011    Total Time spent with patient: 30 minutes  Musculoskeletal: Strength & Muscle Tone: within normal limits- no tremors, no diaphoresis, no restlessness  Gait & Station: normal Patient leans: N/A  Psychiatric Specialty Exam: ROS no headache, no visual disturbances, no chest pain , no shortness of breath, no vomiting , no fever, no chills  Blood pressure 111/83, pulse 93, temperature 98.7 F (37.1 C), temperature source Oral, resp. rate 16, height  (1.778 m), weight 68 kg (150 lb).Body mass index is 21.52 kg/m.  General Appearance: improving groming   Eye Contact::  Good  Speech:  Normal Rate409  Volume:  Normal  Mood:  denies feeling depressed  Affect:  vaguely anxious   Thought Process:  Linear and Descriptions of Associations: Intact  Orientation:  Other:  fully alert and attentive   Thought Content:  no hallucinations, no delusions, not internally preoccupied  Suicidal Thoughts:  No denies suicidal or self injurious ideations, denies any homicidal or violent ideations  Homicidal Thoughts:  No  Memory:  recent and remote grossly intact   Judgement:  Fair  Insight:  Fair  Psychomotor  Activity:  Normal- no tremors, no diaphoresis, no restlessness   Concentration:  Good  Recall:  Good  Fund of Knowledge:Good  Language: Good  Akathisia:  Negative  Handed:  Right  AIMS (if indicated):     Assets:  Communication Skills Desire for Improvement Resilience  Sleep:  Number of Hours: 6.5  Cognition: WNL  ADL's:  Intact   Mental Status Per Nursing Assessment::   On Admission:     Demographic Factors:  47, divorced, lives alone, self employed   Loss Factors: Recent break up, recent relapse on alcohol  Historical Factors: History of Alcohol Use Disorder, history of PTSD  Risk Reduction Factors:   Sense of responsibility to family, Employed and Positive coping skills or problem solving skills  Continued Clinical Symptoms:  Patient presents alert, attentive, oriented x 3, no current symptoms of alcohol withdrawal: no headache, no visual disturbances, no tremors, no diaphoresis, no restlessness - last drank 5/13.  Reports mood improved , denies feeling depressed, affect presents vaguely anxious, no thought disorder, no suicidal or self injurious ideations, no homicidal or violent ideations, no psychotic symptoms. Future oriented, states that he has an appointment with a client tomorrow at 9 AM, and if he misses this job opportunity he will lose " a lot of money". With his express consent I have spoken with his son, who states he does feel patient is improved and agrees with patient's request to  discharge today. We discussed treatment options such as Campral, but declined.  Cognitive Features That Contribute To Risk:  No gross cognitive deficits noted upon discharge. Is alert , attentive, and oriented x 3   Suicide Risk:  Mild:  Suicidal ideation of limited frequency, intensity, duration, and specificity.  There are no identifiable plans, no associated intent, mild dysphoria and related symptoms, good self-control (both objective and subjective assessment), few other risk  factors, and identifiable protective factors, including available and accessible social support.  Follow-up Information    Patient declined referrals Follow up.           Plan Of Care/Follow-up recommendations:  Activity:  as tolerated  Diet:  regular  Tests:  NA Other:  See below Patient is requesting discharge and there are no current grounds for involuntary commitment at this time. He states he is returning home and is future oriented, planning on returning to work tomorrow. At this time he is not presenting with significant alcohol WDL symptoms day 3 post last alcohol consumption- he is aware that although less likely , he could still develop WDL symptoms. Sates he will promptly seek care in this event.  Reports he plans to go to AA meetings regularly .  Craige Cotta, MD 02/18/2018, 1:23 PM

## 2018-02-18 NOTE — [facility] Suicide Risk Assessment (Signed)
[facility] INPATIENT:  Family/Significant Other Suicide Prevention Education  Suicide Prevention Education:  Education Completed; Kristin Lamagna (pt's son) [phone] has been identified by the patient as the family member/significant other with whom the patient will be residing, and identified as the person(s) who will aid the patient in the event of a mental health crisis (suicidal ideations/suicide attempt).  With written consent from the patient, the family member/significant other has been provided the following suicide prevention education, prior to the and/or following the discharge of the patient.  The suicide prevention education provided includes the following:  Suicide risk factors  Suicide prevention and interventions  National Suicide Hotline telephone number  [facility] assessment telephone number  [facility] Emergency Assistance 911  [facility] and/or Residential Mobile Crisis Unit telephone number  Request made of family/significant other to:  Remove weapons (e.g., guns, rifles, knives), all items previously/currently identified as safety concern.    Remove drugs/medications (over-the-counter, prescriptions, illicit drugs), all items previously/currently identified as a safety concern.  The family member/significant other verbalizes understanding of the suicide prevention education information provided.  The family member/significant other agrees to remove the items of safety concern listed above.  SPE completed with pt's son. Pt's son has no safety concerns regarding pt discharging home and verified that pt does not have access to guns/firearms. Dr. Jama Flavors MD notified of above and stated that pt may discharge tomorrow morning (5/17) if he has a good evening.   Kamarrion Stfort N Smart LCSW 02/18/2018, 12:18 PM

## 2018-02-18 NOTE — Plan of Care (Signed)
Patient was able to talk about his frustrations with his girlfriend with Rn.  Problem: Coping: Goal: Ability to verbalize frustrations and anger appropriately will improve Outcome: Progressing

## 2018-02-18 NOTE — Progress Notes (Addendum)
Discharge note: RN reviewed all discharge paperwork with patient and all patient's questions were answered. All of patient's belongings were returned. Prescriptions and samples were given to patient; medication questions answered. Patient walked to parking lot and discharged home with son.

## 2018-02-18 NOTE — Progress Notes (Signed)
  [facility] Adult Case Management Discharge Plan :  Will you be returning to the same living situation after discharge:  Yes,  home At discharge, do you have transportation home?: Yes,  son Do you have the ability to pay for your medications: Yes,  medicaid potential/mental health  Release of information consent forms completed and submitted to medical records by CSW.  Patient to Follow up at: Follow-up Information    Patient declined referrals Follow up.           Next level of care provider has access to [facility] Link:no n/a   Safety Planning and Suicide Prevention discussed: Yes,  SPE completed with pt's son. SPI pamphlet and Mobile Crisis information provided.  Have you used any form of tobacco in the last 30 days? (Cigarettes, Smokeless Tobacco, Cigars, and/or Pipes): Yes  Has patient been referred to the Quitline?: Patient refused referral  Patient has been referred for addiction treatment:  Patient refused referral.  Ledell Peoples Smart, LCSW 02/18/2018, 2:41 PM

## 2018-03-31 ENCOUNTER — Encounter (HOSPITAL_COMMUNITY): Payer: Self-pay | Admitting: Family Medicine

## 2018-03-31 ENCOUNTER — Emergency Department (HOSPITAL_COMMUNITY)
Admission: EM | Admit: 2018-03-31 | Discharge: 2018-03-31 | Disposition: A | Discharge status: Home / Self Care | Payer: Self-pay | Attending: Emergency Medicine | Admitting: Emergency Medicine

## 2018-03-31 DIAGNOSIS — F10920 Alcohol use, unspecified with intoxication, uncomplicated: Secondary | ICD-10-CM

## 2018-03-31 DIAGNOSIS — F17210 Nicotine dependence, cigarettes, uncomplicated: Secondary | ICD-10-CM | POA: Insufficient documentation

## 2018-03-31 DIAGNOSIS — Z79899 Other long term (current) drug therapy: Secondary | ICD-10-CM | POA: Insufficient documentation

## 2018-03-31 DIAGNOSIS — F10220 Alcohol dependence with intoxication, uncomplicated: Secondary | ICD-10-CM | POA: Insufficient documentation

## 2018-03-31 DIAGNOSIS — F1994 Other psychoactive substance use, unspecified with psychoactive substance-induced mood disorder: Secondary | ICD-10-CM | POA: Insufficient documentation

## 2018-03-31 LAB — CBC
HEMATOCRIT: 49.4 % (ref 39.0–52.0)
Hemoglobin: 16.9 g/dL (ref 13.0–17.0)
MCH: 27.7 pg (ref 26.0–34.0)
MCHC: 34.2 g/dL (ref 30.0–36.0)
MCV: 80.9 fL (ref 78.0–100.0)
Platelets: 294 10*3/uL (ref 150–400)
RBC: 6.11 MIL/uL — ABNORMAL HIGH (ref 4.22–5.81)
RDW: 14.4 % (ref 11.5–15.5)
WBC: 9.2 10*3/uL (ref 4.0–10.5)

## 2018-03-31 LAB — COMPREHENSIVE METABOLIC PANEL
ALBUMIN: 4.6 g/dL (ref 3.5–5.0)
ALT: 26 U/L (ref 0–44)
AST: 37 U/L (ref 15–41)
Alkaline Phosphatase: 53 U/L (ref 38–126)
Anion gap: 11 (ref 5–15)
BILIRUBIN TOTAL: 0.5 mg/dL (ref 0.3–1.2)
BUN: 9 mg/dL (ref 6–20)
CHLORIDE: 106 mmol/L (ref 98–111)
CO2: 24 mmol/L (ref 22–32)
CREATININE: 0.75 mg/dL (ref 0.61–1.24)
Calcium: 8.9 mg/dL (ref 8.9–10.3)
GFR calc Af Amer: >60 mL/min (ref >60)
GLUCOSE: 137 mg/dL — AB (ref 70–99)
POTASSIUM: 3.6 mmol/L (ref 3.5–5.1)
Sodium: 141 mmol/L (ref 135–145)
Total Protein: 8.2 g/dL — ABNORMAL HIGH (ref 6.5–8.1)

## 2018-03-31 LAB — ETHANOL: Alcohol, Ethyl (B): 242 mg/dL — ABNORMAL HIGH (ref <10)

## 2018-03-31 MED ORDER — LORAZEPAM 1 MG PO TABS
1.0000 mg | ORAL_TABLET | Freq: Once | ORAL | Status: AC
  Administered 2018-03-31: 1 mg via ORAL
  Filled 2018-03-31: qty 1

## 2018-03-31 MED ORDER — NICOTINE 21 MG/24HR TD PT24: 21.0000 mg | MEDICATED_PATCH | Freq: Once | TRANSDERMAL | Status: DC

## 2018-03-31 MED ORDER — LORAZEPAM 2 MG/ML IJ SOLN: 0.0000 mg | Freq: Four times a day (QID) | INTRAMUSCULAR | Status: DC

## 2018-03-31 MED ORDER — THIAMINE HCL 100 MG/ML IJ SOLN: 100.0000 mg | Freq: Every day | INTRAMUSCULAR | Status: DC

## 2018-03-31 MED ORDER — LORAZEPAM 1 MG PO TABS
0.0000 mg | ORAL_TABLET | Freq: Four times a day (QID) | ORAL | Status: DC
  Administered 2018-03-31: 2 mg via ORAL
  Filled 2018-03-31: qty 2

## 2018-03-31 MED ORDER — CHLORDIAZEPOXIDE HCL 25 MG PO CAPS: ORAL_CAPSULE | ORAL | 0 refills | Status: DC

## 2018-03-31 MED ORDER — LORAZEPAM 2 MG/ML IJ SOLN: 0.0000 mg | Freq: Two times a day (BID) | INTRAMUSCULAR | Status: DC

## 2018-03-31 MED ORDER — LORAZEPAM 1 MG PO TABS: 0.0000 mg | ORAL_TABLET | Freq: Two times a day (BID) | ORAL | Status: DC

## 2018-03-31 MED ORDER — VITAMIN B-1 100 MG PO TABS
100.0000 mg | ORAL_TABLET | Freq: Every day | ORAL | Status: DC
  Administered 2018-03-31: 100 mg via ORAL
  Filled 2018-03-31: qty 1

## 2018-03-31 NOTE — ED Provider Notes (Signed)
  Physical Exam  BP (!) 124/92   Pulse 100   Temp 98.6 F (37 C) (Oral)   Resp 16   Ht 5\' 10"  (1.778 m)   Wt 77.1 kg (170 lb)   SpO2 95%   BMI 24.39 kg/m   Physical Exam  ED Course/Procedures     Procedures  MDM  Patient been seen by psychiatry and cleared for discharge.  Will discharge with Librium.  Vitals improved.       Benjiman CorePickering, Johsua Shevlin, MD 03/31/18 1932

## 2018-03-31 NOTE — ED Notes (Signed)
Pt asked for more "drugs" when asked to elaborate he said "ativan". Pt is not diaphoretic or tremulous at this time. Pt was sleeping when RN walked into the room. Respirations even and unlabored. No distress noted. Will continue to monitor.

## 2018-03-31 NOTE — ED Notes (Addendum)
Pt refuses to put on burgundy scrubs. Pt explained rationale of pts changing into scrubs

## 2018-03-31 NOTE — ED Provider Notes (Signed)
[facility]-EMERGENCY DEPT Provider Note   CSN: [id number] Arrival date & time: 03/31/18  1353     History   Chief Complaint Chief Complaint  Patient presents with  . Alcohol Problem    HPI [name] is a 47 y.o. male.  Patient with hx etoh abuse, depression, c/o worsening depression and alcohol abuse. States drinks 30-40 beers per day. Last drank earlier today. States very depressed with suicidal thoughts. Symptoms severe, persistent, worse in past few days. Denies any specific inciting event or new stressor. Denies attempt to harm self, denies overdose.  Poor appetite. Trouble sleeping at night/feels restless. Denies other substance abuse problems. Denies recent physical illness. History of alcohol withdrawal symptoms, prior withdrawal seizure.   The history is provided by the patient.  Alcohol Problem  Pertinent negatives include no chest pain, no abdominal pain, no headaches and no shortness of breath.    Past Medical History:  Diagnosis Date  . Active smoker   . Alcohol abuse   . Anxiety   . Bipolar 2 disorder (HCC)   . Depression   . PTSD (post-traumatic stress disorder)     Patient Active Problem List   Diagnosis Date Noted  . 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 EtOH dependence (HCC) 12/11/2017  . Alcohol withdrawal (HCC) 12/10/2017  . Seizure due to alcohol withdrawal (HCC) 08/30/2017  . Alcohol withdrawal seizure (HCC) 08/30/2017  . Tobacco abuse   . Delirium tremens (HCC) 03/02/2017  . GI bleed 10/06/2013  . Marijuana abuse 10/06/2013  . Suicidal ideation 10/05/2013  . Alcohol abuse 10/26/2011  . Depression 10/26/2011    Past Surgical History:  Procedure Laterality Date  . LYMPH NODE DISSECTION          Home Medications    Prior to Admission medications   Medication Sig Start Date End Date Taking? Authorizing Provider  gabapentin (NEURONTIN) 300 MG capsule Take 1  capsule (300 mg total) by mouth 3 (three) times daily. For agitation 02/18/18   Armandina StammerNwoko, Agnes I, NP  hydrOXYzine (ATARAX/VISTARIL) 25 MG tablet Take 1 tablet (25 mg) by mouth Q 6 hours as needed: For anxiety 02/18/18   Armandina StammerNwoko, Agnes I, NP  nicotine polacrilex (NICORETTE) 2 MG gum Take 1 each (2 mg total) by mouth as needed for smoking cessation. 02/18/18   Armandina StammerNwoko, Agnes I, NP  traZODone (DESYREL) 50 MG tablet Take 1 tablet (50 mg total) by mouth at bedtime as needed for sleep. 02/18/18   Sanjuana KavaNwoko, Agnes I, NP    Family History Family History  Problem Relation Age of Onset  . Alcohol abuse Father     Social History Social History   Tobacco Use  . Smoking status: Current Every Day Smoker    Packs/day: 2.00    Years: 20.00    Pack years: 40.00    Types: Cigarettes  . Smokeless tobacco: Never Used  Substance Use Topics  . Alcohol use: Yes    Comment: Binge drinker, last drink yesterday at 17:00.  . Drug use: Yes    Types: Marijuana    Comment: Daily. Last used: 2 days ago      Allergies   Paxil [paroxetine hcl]   Review of Systems Review of Systems  Constitutional: Negative for fever.  HENT: Negative for sore throat.   Eyes: Negative for redness.  Respiratory: Negative for shortness of breath.   Cardiovascular: Negative for chest pain.  Gastrointestinal: Negative for abdominal pain.  Genitourinary: Negative  for flank pain.  Musculoskeletal: Negative for back pain and neck pain.  Skin: Negative for rash.  Neurological: Negative for headaches.  Hematological: Does not bruise/bleed easily.  Psychiatric/Behavioral: Positive for suicidal ideas. Negative for confusion. The patient is nervous/anxious.      Physical Exam Updated Vital Signs BP (!) 129/95   Pulse (!) 107   Temp 98.6 F (37 C) (Oral)   Resp 20   Ht 1.778 m (5\' 10" )   Wt 77.1 kg (170 lb)   SpO2 94%   BMI 24.39 kg/m   Physical Exam  Constitutional: He appears well-developed and well-nourished.  HENT:  Head:  Atraumatic.  Mouth/Throat: Oropharynx is clear and moist.  Eyes: Pupils are equal, round, and reactive to light. Conjunctivae are normal.  Neck: Neck supple. No tracheal deviation present.  Cardiovascular: Normal rate, regular rhythm, normal heart sounds and intact distal pulses.  Pulmonary/Chest: Effort normal and breath sounds normal. No accessory muscle usage. No respiratory distress.  Abdominal: Soft. He exhibits no distension. There is no tenderness.  Musculoskeletal: He exhibits no edema.  Neurological: He is alert.  Mildly shaky. Ambulates w steady gait.   Skin: Skin is warm and dry. He is not diaphoretic.  Psychiatric:  Anxious and tearful appearing.   Nursing note and vitals reviewed.    ED Treatments / Results  Labs (all labs ordered are listed, but only abnormal results are displayed) Results for orders placed or performed during the hospital encounter of 03/31/18  Comprehensive metabolic panel  Result Value Ref Range   Sodium 141 135 - 145 mmol/L   Potassium 3.6 3.5 - 5.1 mmol/L   Chloride 106 98 - 111 mmol/L   CO2 24 22 - 32 mmol/L   Glucose, Bld 137 (H) 70 - 99 mg/dL   BUN 9 6 - 20 mg/dL   Creatinine, Ser 1.61 0.61 - 1.24 mg/dL   Calcium 8.9 8.9 - 09.6 mg/dL   Total Protein 8.2 (H) 6.5 - 8.1 g/dL   Albumin 4.6 3.5 - 5.0 g/dL   AST 37 15 - 41 U/L   ALT 26 0 - 44 U/L   Alkaline Phosphatase 53 38 - 126 U/L   Total Bilirubin 0.5 0.3 - 1.2 mg/dL   GFR calc non Af Amer >60 >60 mL/min   GFR calc Af Amer >60 >60 mL/min   Anion gap 11 5 - 15  Ethanol  Result Value Ref Range   Alcohol, Ethyl (B) 242 (H) <10 mg/dL  cbc  Result Value Ref Range   WBC 9.2 4.0 - 10.5 K/uL   RBC 6.11 (H) 4.22 - 5.81 MIL/uL   Hemoglobin 16.9 13.0 - 17.0 g/dL   HCT 04.5 40.9 - 81.1 %   MCV 80.9 78.0 - 100.0 fL   MCH 27.7 26.0 - 34.0 pg   MCHC 34.2 30.0 - 36.0 g/dL   RDW 91.4 78.2 - 95.6 %   Platelets 294 150 - 400 K/uL    EKG None  Radiology No results  found.  Procedures Procedures (including critical care time)  Medications Ordered in ED Medications  LORazepam (ATIVAN) injection 0-4 mg (has no administration in time range)    Or  LORazepam (ATIVAN) tablet 0-4 mg (has no administration in time range)  LORazepam (ATIVAN) injection 0-4 mg (has no administration in time range)    Or  LORazepam (ATIVAN) tablet 0-4 mg (has no administration in time range)  thiamine (VITAMIN B-1) tablet 100 mg (has no administration in time range)  Or  thiamine (B-1) injection 100 mg (has no administration in time range)     Initial Impression / Assessment and Plan / ED Course  I have reviewed the triage vital signs and the nursing notes.  Pertinent labs & imaging results that were available during my care of the patient were reviewed by me and considered in my medical decision making (see chart for details).  Labs.   BH team consulted.   BH holding orders including CIWA protocol ordered.   Reviewed nursing notes and prior charts for additional history.   Labs reviewed - etoh is high.   BH eval remains pending.   Disposition per [facility] team.     Final Clinical Impressions(s) / ED Diagnoses   Final diagnoses:  None    ED Discharge Orders    None       Cathren Laine, MD 03/31/18 [phone]

## 2018-03-31 NOTE — ED Notes (Addendum)
Security now at room with patient--patient came back inside, and thought that another patient had a bomb on them. Pt tearful upon conversing with.

## 2018-03-31 NOTE — ED Triage Notes (Signed)
Patient reports he wants to ETOH detox. He reports his last drink was yesterday at 17:00. Usually a binge drinker, patient reports these last 2-3 days, he has drunk at lease 60 12oz beers. Patient reports yesterday he didn't want to live but denies being suicidal today. He states his son came by and that gives him reason to live. Denies being homicidal.

## 2018-03-31 NOTE — [facility] Counselor (Signed)
Disposition:   Case staffed and reviewed with Nanine MeansJamison Lord, NP, states patient does not meet inpatient psychiatric hospitalization at this time. Patient psych-cleared. Patient provided resources for inpatient as well as outpatient services providers who can assist him with substance and mental health services.

## 2018-03-31 NOTE — ED Notes (Signed)
Pt was sleeping in room when RN went to get new vital signs. Pt woke and said "can you tell the doctor I'm detoxing again". Pt begins to be tremulous at this time. Pt was instructed that vital signs needed to be obtained and to be as still as possible. Pt stopped all shaking at this point. After vital signs were taken, pt said "will you tell the doctor how bad I am shaking so he will give me more drugs." Vital signs stable. Will continue to monitor
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
## 2018-03-31 NOTE — ED Notes (Signed)
Pt states he is going outside to smoke a cigarette. Pt was informed that we do not allow smoking on this campus as well as for patients to come in and out of the department due to safety and care concerns. Pt continued to walk out of the lobby towards the door.

## 2018-04-03 ENCOUNTER — Emergency Department (HOSPITAL_COMMUNITY)
Admission: EM | Admit: 2018-04-03 | Discharge: 2018-04-04 | Disposition: A | Discharge status: Home / Self Care | Payer: Self-pay | Attending: Emergency Medicine | Admitting: Emergency Medicine

## 2018-04-03 ENCOUNTER — Other Ambulatory Visit: Payer: Self-pay

## 2018-04-03 DIAGNOSIS — F17210 Nicotine dependence, cigarettes, uncomplicated: Secondary | ICD-10-CM | POA: Insufficient documentation

## 2018-04-03 DIAGNOSIS — F10220 Alcohol dependence with intoxication, uncomplicated: Secondary | ICD-10-CM | POA: Insufficient documentation

## 2018-04-03 DIAGNOSIS — R112 Nausea with vomiting, unspecified: Secondary | ICD-10-CM | POA: Insufficient documentation

## 2018-04-03 DIAGNOSIS — R251 Tremor, unspecified: Secondary | ICD-10-CM | POA: Insufficient documentation

## 2018-04-03 DIAGNOSIS — Z79899 Other long term (current) drug therapy: Secondary | ICD-10-CM | POA: Insufficient documentation

## 2018-04-03 DIAGNOSIS — Y908 Blood alcohol level of 240 mg/100 ml or more: Secondary | ICD-10-CM | POA: Insufficient documentation

## 2018-04-03 DIAGNOSIS — F1010 Alcohol abuse, uncomplicated: Secondary | ICD-10-CM

## 2018-04-03 LAB — COMPREHENSIVE METABOLIC PANEL
ALT: 52 U/L — ABNORMAL HIGH (ref 0–44)
AST: 67 U/L — ABNORMAL HIGH (ref 15–41)
Albumin: 4.3 g/dL (ref 3.5–5.0)
Alkaline Phosphatase: 51 U/L (ref 38–126)
Anion gap: 15 (ref 5–15)
BUN: 13 mg/dL (ref 6–20)
CO2: 25 mmol/L (ref 22–32)
Calcium: 9 mg/dL (ref 8.9–10.3)
Chloride: 103 mmol/L (ref 98–111)
Creatinine, Ser: 1.02 mg/dL (ref 0.61–1.24)
GFR calc Af Amer: >60 mL/min (ref >60)
GFR calc non Af Amer: >60 mL/min (ref >60)
Glucose, Bld: 113 mg/dL — ABNORMAL HIGH (ref 70–99)
Potassium: 3.7 mmol/L (ref 3.5–5.1)
Sodium: 143 mmol/L (ref 135–145)
Total Bilirubin: 0.9 mg/dL (ref 0.3–1.2)
Total Protein: 7.7 g/dL (ref 6.5–8.1)

## 2018-04-03 LAB — CBC
HCT: 48.4 % (ref 39.0–52.0)
Hemoglobin: 15.9 g/dL (ref 13.0–17.0)
MCH: 26.4 pg (ref 26.0–34.0)
MCHC: 32.9 g/dL (ref 30.0–36.0)
MCV: 80.4 fL (ref 78.0–100.0)
Platelets: 304 10*3/uL (ref 150–400)
RBC: 6.02 MIL/uL — ABNORMAL HIGH (ref 4.22–5.81)
RDW: 14.6 % (ref 11.5–15.5)
WBC: 9.1 10*3/uL (ref 4.0–10.5)

## 2018-04-03 LAB — RAPID URINE DRUG SCREEN, HOSP PERFORMED
Amphetamines: NOT DETECTED
Benzodiazepines: NOT DETECTED
Cocaine: NOT DETECTED
Opiates: NOT DETECTED
Tetrahydrocannabinol: POSITIVE — AB

## 2018-04-03 LAB — ETHANOL: Alcohol, Ethyl (B): 255 mg/dL — ABNORMAL HIGH (ref <10)

## 2018-04-03 MED ORDER — ONDANSETRON HCL 4 MG/2ML IJ SOLN
4.0000 mg | Freq: Once | INTRAMUSCULAR | Status: AC
  Administered 2018-04-03: 4 mg via INTRAVENOUS
  Filled 2018-04-03: qty 2

## 2018-04-03 MED ORDER — LORAZEPAM 2 MG/ML IJ SOLN
2.0000 mg | Freq: Once | INTRAMUSCULAR | Status: AC
  Administered 2018-04-03: 2 mg via INTRAVENOUS
  Filled 2018-04-03: qty 1

## 2018-04-03 MED ORDER — SODIUM CHLORIDE 0.9 % IV BOLUS
1000.0000 mL | Freq: Once | INTRAVENOUS | Status: AC
  Administered 2018-04-03: 1000 mL via INTRAVENOUS

## 2018-04-03 MED ORDER — THIAMINE HCL 100 MG/ML IJ SOLN
Freq: Once | INTRAVENOUS | Status: AC
  Administered 2018-04-03: 23:00:00 via INTRAVENOUS
  Filled 2018-04-03: qty 1000

## 2018-04-03 MED ORDER — NICOTINE 21 MG/24HR TD PT24
21.0000 mg | MEDICATED_PATCH | Freq: Once | TRANSDERMAL | Status: DC
  Administered 2018-04-03: 21 mg via TRANSDERMAL
  Filled 2018-04-03: qty 1

## 2018-04-03 NOTE — ED Triage Notes (Signed)
Patient states that he wants detox - states he has been drinking 48-50 beers per day for the last 3 days. Patient states that he doesn't normally drink. Patient is tremulous - also c/o PTSD.

## 2018-04-03 NOTE — ED Provider Notes (Signed)
[facility] Provider Note   CSN: [id number] Arrival date & time: 04/03/18  2142     History   Chief Complaint Chief Complaint  Patient presents with  . Withdrawal    HPI [name] is a 47 y.o. male.  Patient returns to the ED requesting help with alcoholism. He reports 48-50 beers daily. Last drink was this morning. He feels his PTSD is making it impossible for him to stop drinking. No other substance abuse. He currently denies SI/HI. He states he has a history of withdrawal seizures.   The history is provided by the patient. No language interpreter was used.    Past Medical History:  Diagnosis Date  . Active smoker   . Alcohol abuse   . Anxiety   . Bipolar 2 disorder (HCC)   . Depression   . PTSD (post-traumatic stress disorder)     Patient Active Problem List   Diagnosis Date Noted  . 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 EtOH dependence (HCC) 12/11/2017  . Alcohol withdrawal (HCC) 12/10/2017  . Seizure due to alcohol withdrawal (HCC) 08/30/2017  . Alcohol withdrawal seizure (HCC) 08/30/2017  . Tobacco abuse   . Delirium tremens (HCC) 03/02/2017  . GI bleed 10/06/2013  . Marijuana abuse 10/06/2013  . Suicidal ideation 10/05/2013  . Alcohol abuse 10/26/2011  . Depression 10/26/2011    Past Surgical History:  Procedure Laterality Date  . LYMPH NODE DISSECTION          Home Medications    Prior to Admission medications   Medication Sig Start Date End Date Taking? Authorizing Provider  chlordiazePOXIDE (LIBRIUM) 25 MG capsule 50mg  PO TID x 1D, then 25-50mg  PO BID X 1D, then 25-50mg  PO QD X 1D Patient not taking: Reported on 04/03/2018 03/31/18   Benjiman CorePickering, Nathan, MD  gabapentin (NEURONTIN) 300 MG capsule Take 1 capsule (300 mg total) by mouth 3 (three) times daily. For agitation Patient not taking: Reported on 03/31/2018 02/18/18   Armandina StammerNwoko, Agnes I, NP    hydrOXYzine (ATARAX/VISTARIL) 25 MG tablet Take 1 tablet (25 mg) by mouth Q 6 hours as needed: For anxiety Patient not taking: Reported on 03/31/2018 02/18/18   Armandina StammerNwoko, Agnes I, NP  nicotine polacrilex (NICORETTE) 2 MG gum Take 1 each (2 mg total) by mouth as needed for smoking cessation. Patient not taking: Reported on 03/31/2018 02/18/18   Armandina StammerNwoko, Agnes I, NP  traZODone (DESYREL) 50 MG tablet Take 1 tablet (50 mg total) by mouth at bedtime as needed for sleep. Patient not taking: Reported on 03/31/2018 02/18/18   Sanjuana KavaNwoko, Agnes I, NP    Family History Family History  Problem Relation Age of Onset  . Alcohol abuse Father     Social History Social History   Tobacco Use  . Smoking status: Current Every Day Smoker    Packs/day: 2.00    Years: 20.00    Pack years: 40.00    Types: Cigarettes  . Smokeless tobacco: Never Used  Substance Use Topics  . Alcohol use: Yes    Comment: Binge drinker, last drink yesterday at 17:00.  . Drug use: Yes    Types: Marijuana    Comment: Daily. Last used: 2 days ago      Allergies   Paxil [paroxetine hcl]   Review of Systems Review of Systems  Constitutional: Positive for appetite change. Negative for chills and fever.  HENT: Negative.   Respiratory: Negative.   Cardiovascular: Negative.  Gastrointestinal: Positive for nausea and vomiting.  Musculoskeletal: Negative.   Skin: Negative.   Neurological: Positive for tremors. Negative for seizures.     Physical Exam Updated Vital Signs BP 125/89   Pulse (!) 121   Temp 98.6 F (37 C) (Oral)   Resp 17   Ht 5\' 10"  (1.778 m)   Wt 77.1 kg (170 lb)   SpO2 95%   BMI 24.39 kg/m   Physical Exam  Constitutional: He is oriented to person, place, and time. He appears well-developed and well-nourished.  HENT:  Head: Normocephalic.  Neck: Normal range of motion. Neck supple.  Cardiovascular: Normal rate and regular rhythm.  Pulmonary/Chest: Effort normal and breath sounds normal.  Abdominal:  Soft. Bowel sounds are normal. There is no tenderness. There is no rebound and no guarding.  Musculoskeletal: Normal range of motion.  Neurological: He is alert and oriented to person, place, and time.  Tremulous  Skin: Skin is warm and dry. No rash noted.  Psychiatric: His affect is labile. His speech is rapid and/or pressured. He exhibits a depressed mood. He expresses no suicidal ideation.     ED Treatments / Results  Labs (all labs ordered are listed, but only abnormal results are displayed) Labs Reviewed  COMPREHENSIVE METABOLIC PANEL  ETHANOL  CBC  RAPID URINE DRUG SCREEN, HOSP PERFORMED    EKG None  Radiology No results found.  Procedures Procedures (including critical care time)  Medications Ordered in ED Medications  sodium chloride 0.9 % bolus 1,000 mL (has no administration in time range)  sodium chloride 0.9 % 1,000 mL with thiamine 100 mg, folic acid 1 mg, multivitamins adult 10 mL infusion (has no administration in time range)     Initial Impression / Assessment and Plan / ED Course  I have reviewed the triage vital signs and the nursing notes.  Pertinent labs & imaging results that were available during my care of the patient were reviewed by me and considered in my medical decision making (see chart for details).     Patient presents with concern for alcohol withdrawal. Heavy, daily drinker last alcohol this morning. He has become tremulous throughout the day. Reports nausea with vomiting. No fever, hallucinations, SI/HI.  He presents tachycardic with generalized tremors. CIWA protocol started. Plan to hydrate with NS and banana bag, check labs, Ativan prn. Serial exams.   12:15 - sleeping. Remains tachycardic despite fluids. Will provide a 3rd liter.   2:30 - sleeping. Vital signs improving. No at-rest tremors. No seizure activity.  5:30 - Patient's vitals have normalized. No further tachycardia. No vomiting. He is east to wake and alert/oriented. No  SI/HI. Discussed outpatient resources for alcohol dependence. He is felt appropriate for discharge. Will provide Librium.   Final Clinical Impressions(s) / ED Diagnoses   Final diagnoses:  None   1. Alcoholism  ED Discharge Orders    None       Danne Harbor 04/04/18 [id number]    Raeford Razor, MD 04/04/18 [place]

## 2018-04-03 NOTE — ED Notes (Signed)
Pt given Malaw[place]key sandwich with sprite.

## 2018-04-04 MED ORDER — LORAZEPAM 1 MG PO TABS
0.0000 mg | ORAL_TABLET | Freq: Four times a day (QID) | ORAL | Status: DC
  Administered 2018-04-04: 2 mg via ORAL
  Filled 2018-04-04: qty 2

## 2018-04-04 MED ORDER — VITAMIN B-1 100 MG PO TABS: 100.0000 mg | ORAL_TABLET | Freq: Every day | ORAL | Status: DC

## 2018-04-04 MED ORDER — CHLORDIAZEPOXIDE HCL 25 MG PO CAPS: ORAL_CAPSULE | ORAL | 0 refills | Status: DC

## 2018-04-04 MED ORDER — ONDANSETRON HCL 4 MG/2ML IJ SOLN
4.0000 mg | Freq: Once | INTRAMUSCULAR | Status: AC
  Administered 2018-04-04: 4 mg via INTRAVENOUS
  Filled 2018-04-04: qty 2

## 2018-04-04 MED ORDER — SODIUM CHLORIDE 0.9 % IV BOLUS
1000.0000 mL | Freq: Once | INTRAVENOUS | Status: AC
  Administered 2018-04-04: 1000 mL via INTRAVENOUS

## 2018-04-04 MED ORDER — LORAZEPAM 2 MG/ML IJ SOLN: 0.0000 mg | Freq: Two times a day (BID) | INTRAMUSCULAR | Status: DC

## 2018-04-04 MED ORDER — THIAMINE HCL 100 MG/ML IJ SOLN: 100.0000 mg | Freq: Every day | INTRAMUSCULAR | Status: DC

## 2018-04-04 MED ORDER — LORAZEPAM 2 MG/ML IJ SOLN: 0.0000 mg | Freq: Four times a day (QID) | INTRAMUSCULAR | Status: DC

## 2018-04-04 MED ORDER — LORAZEPAM 1 MG PO TABS: 0.0000 mg | ORAL_TABLET | Freq: Two times a day (BID) | ORAL | Status: DC

## 2018-04-04 NOTE — ED Notes (Signed)
Pt given turkey sandwich and sprite.  

## 2018-04-04 NOTE — ED Notes (Signed)
Pt placed on 2L [place] for satting 89 in sleep.

## 2018-11-06 ENCOUNTER — Other Ambulatory Visit: Payer: Self-pay

## 2018-11-06 ENCOUNTER — Encounter (HOSPITAL_COMMUNITY): Payer: Self-pay | Admitting: *Deleted

## 2018-11-06 ENCOUNTER — Emergency Department (HOSPITAL_COMMUNITY)
Admission: EM | Admit: 2018-11-06 | Discharge: 2018-11-07 | Disposition: A | Discharge status: Home / Self Care | Payer: Self-pay | Attending: Emergency Medicine | Admitting: Emergency Medicine

## 2018-11-06 DIAGNOSIS — F1014 Alcohol abuse with alcohol-induced mood disorder: Secondary | ICD-10-CM

## 2018-11-06 DIAGNOSIS — F22 Delusional disorders: Secondary | ICD-10-CM

## 2018-11-06 DIAGNOSIS — Z720 Tobacco use: Secondary | ICD-10-CM

## 2018-11-06 DIAGNOSIS — R440 Auditory hallucinations: Secondary | ICD-10-CM | POA: Insufficient documentation

## 2018-11-06 DIAGNOSIS — Z008 Encounter for other general examination: Secondary | ICD-10-CM

## 2018-11-06 DIAGNOSIS — F10920 Alcohol use, unspecified with intoxication, uncomplicated: Secondary | ICD-10-CM | POA: Insufficient documentation

## 2018-11-06 DIAGNOSIS — R443 Hallucinations, unspecified: Secondary | ICD-10-CM

## 2018-11-06 DIAGNOSIS — Z789 Other specified health status: Secondary | ICD-10-CM

## 2018-11-06 DIAGNOSIS — Z046 Encounter for general psychiatric examination, requested by authority: Secondary | ICD-10-CM | POA: Insufficient documentation

## 2018-11-06 DIAGNOSIS — Z7289 Other problems related to lifestyle: Secondary | ICD-10-CM

## 2018-11-06 DIAGNOSIS — Y908 Blood alcohol level of 240 mg/100 ml or more: Secondary | ICD-10-CM | POA: Insufficient documentation

## 2018-11-06 DIAGNOSIS — F1090 Alcohol use, unspecified, uncomplicated: Secondary | ICD-10-CM

## 2018-11-06 DIAGNOSIS — F17210 Nicotine dependence, cigarettes, uncomplicated: Secondary | ICD-10-CM | POA: Insufficient documentation

## 2018-11-06 DIAGNOSIS — F419 Anxiety disorder, unspecified: Secondary | ICD-10-CM | POA: Insufficient documentation

## 2018-11-06 LAB — RAPID URINE DRUG SCREEN, HOSP PERFORMED
Amphetamines: NOT DETECTED
Barbiturates: NOT DETECTED
Benzodiazepines: NOT DETECTED
COCAINE: NOT DETECTED
OPIATES: NOT DETECTED
Tetrahydrocannabinol: NOT DETECTED

## 2018-11-06 LAB — CBC WITH DIFFERENTIAL/PLATELET
Abs Immature Granulocytes: 0.03 10*3/uL (ref 0.00–0.07)
BASOS ABS: 0.1 10*3/uL (ref 0.0–0.1)
Basophils Relative: 1 %
EOS ABS: 0.2 10*3/uL (ref 0.0–0.5)
Eosinophils Relative: 2 %
HEMATOCRIT: 48.6 % (ref 39.0–52.0)
HEMOGLOBIN: 15.6 g/dL (ref 13.0–17.0)
Immature Granulocytes: 0 %
LYMPHS ABS: 3.9 10*3/uL (ref 0.7–4.0)
LYMPHS PCT: 51 %
MCH: 26.5 pg (ref 26.0–34.0)
MCHC: 32.1 g/dL (ref 30.0–36.0)
MCV: 82.5 fL (ref 80.0–100.0)
MONOS PCT: 5 %
Monocytes Absolute: 0.4 10*3/uL (ref 0.1–1.0)
NEUTROS ABS: 3.1 10*3/uL (ref 1.7–7.7)
NEUTROS PCT: 41 %
Platelets: 308 10*3/uL (ref 150–400)
RBC: 5.89 MIL/uL — ABNORMAL HIGH (ref 4.22–5.81)
RDW: 15.8 % — AB (ref 11.5–15.5)
WBC: 7.7 10*3/uL (ref 4.0–10.5)
nRBC: 0 % (ref 0.0–0.2)

## 2018-11-06 LAB — COMPREHENSIVE METABOLIC PANEL
ALT: 35 U/L (ref 0–44)
ANION GAP: 12 (ref 5–15)
AST: 40 U/L (ref 15–41)
Albumin: 4.7 g/dL (ref 3.5–5.0)
Alkaline Phosphatase: 60 U/L (ref 38–126)
BUN: 10 mg/dL (ref 6–20)
CO2: 24 mmol/L (ref 22–32)
Calcium: 8.8 mg/dL — ABNORMAL LOW (ref 8.9–10.3)
Chloride: 106 mmol/L (ref 98–111)
Creatinine, Ser: 0.71 mg/dL (ref 0.61–1.24)
Glucose, Bld: 93 mg/dL (ref 70–99)
Potassium: 3.8 mmol/L (ref 3.5–5.1)
SODIUM: 142 mmol/L (ref 135–145)
TOTAL PROTEIN: 8.5 g/dL — AB (ref 6.5–8.1)
Total Bilirubin: 0.5 mg/dL (ref 0.3–1.2)

## 2018-11-06 LAB — ACETAMINOPHEN LEVEL: Acetaminophen (Tylenol), Serum: <10 ug/mL — ABNORMAL LOW (ref 10–30)

## 2018-11-06 LAB — ETHANOL: ALCOHOL ETHYL (B): 342 mg/dL — AB (ref <10)

## 2018-11-06 LAB — SALICYLATE LEVEL: Salicylate Lvl: <7 mg/dL (ref 2.8–30.0)

## 2018-11-06 MED ORDER — LORAZEPAM 1 MG PO TABS: 0.0000 mg | ORAL_TABLET | Freq: Two times a day (BID) | ORAL | Status: DC

## 2018-11-06 MED ORDER — NICOTINE POLACRILEX 2 MG MT GUM
2.0000 mg | CHEWING_GUM | OROMUCOSAL | Status: DC | PRN
  Administered 2018-11-07: 2 mg via ORAL
  Filled 2018-11-06: qty 1

## 2018-11-06 MED ORDER — IBUPROFEN 200 MG PO TABS: 600.0000 mg | ORAL_TABLET | Freq: Three times a day (TID) | ORAL | Status: DC | PRN

## 2018-11-06 MED ORDER — LORAZEPAM 2 MG/ML IJ SOLN: 0.0000 mg | Freq: Two times a day (BID) | INTRAMUSCULAR | Status: DC

## 2018-11-06 MED ORDER — NICOTINE 21 MG/24HR TD PT24: 21.0000 mg | MEDICATED_PATCH | Freq: Every day | TRANSDERMAL | Status: DC

## 2018-11-06 MED ORDER — ZOLPIDEM TARTRATE 5 MG PO TABS: 5.0000 mg | ORAL_TABLET | Freq: Every evening | ORAL | Status: DC | PRN

## 2018-11-06 MED ORDER — VITAMIN B-1 100 MG PO TABS
100.0000 mg | ORAL_TABLET | Freq: Every day | ORAL | Status: DC
  Administered 2018-11-06 – 2018-11-07 (×2): 100 mg via ORAL
  Filled 2018-11-06 (×2): qty 1

## 2018-11-06 MED ORDER — ALUM & MAG HYDROXIDE-SIMETH 200-200-20 MG/5ML PO SUSP: 30.0000 mL | Freq: Four times a day (QID) | ORAL | Status: DC | PRN

## 2018-11-06 MED ORDER — THIAMINE HCL 100 MG/ML IJ SOLN: 100.0000 mg | Freq: Every day | INTRAMUSCULAR | Status: DC

## 2018-11-06 MED ORDER — ONDANSETRON HCL 4 MG PO TABS
4.0000 mg | ORAL_TABLET | Freq: Three times a day (TID) | ORAL | Status: DC | PRN
  Administered 2018-11-07: 4 mg via ORAL
  Filled 2018-11-06: qty 1

## 2018-11-06 MED ORDER — LORAZEPAM 1 MG PO TABS
0.0000 mg | ORAL_TABLET | Freq: Four times a day (QID) | ORAL | Status: DC
  Administered 2018-11-06 – 2018-11-07 (×2): 1 mg via ORAL
  Administered 2018-11-07: 2 mg via ORAL
  Filled 2018-11-06 (×2): qty 1
  Filled 2018-11-06: qty 2

## 2018-11-06 MED ORDER — LORAZEPAM 2 MG/ML IJ SOLN: 0.0000 mg | Freq: Four times a day (QID) | INTRAMUSCULAR | Status: DC

## 2018-11-06 NOTE — ED Notes (Signed)
Bed: WA30 Expected date:  Expected time:  Means of arrival:  Comments: 

## 2018-11-06 NOTE — ED Notes (Signed)
Patient belongings include his phone, necklace, phone, clothing and hat. All put in the locker.

## 2018-11-06 NOTE — ED Notes (Signed)
Bed: WLPT4 Expected date:  Expected time:  Means of arrival:  Comments: 

## 2018-11-06 NOTE — ED Notes (Signed)
Mercedes, PA at bedside. 
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
## 2018-11-06 NOTE — Progress Notes (Signed)
TTS consulted with Nira ConnJason Berry, NP who recommends continued observation for safety and stabilization and to be reassessed in the AM by psych due to hx of depression, alcohol abuse, and inability to contract for safety at this time. EDP Street, LawteyMercedes, VF CorporationPA-C and pt's nurse have been advised.   Princess BruinsAquicha Rosebud Koenen, MSW, LCSW Therapeutic Triage Specialist  [phone]
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
## 2018-11-06 NOTE — ED Notes (Signed)
Patient belongings taken, wanded by security.

## 2018-11-06 NOTE — ED Triage Notes (Addendum)
Pt reports having issues with PTSD this evening.  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 Pt denies SI or any physical symptoms. Pt observed having twitches.  Pt appears intoxicated and endorses drinking ETOH earlier today.

## 2018-11-06 NOTE — ED Notes (Signed)
Pt was informed that he would need to change into paper scrubs and have [place] take his belongings to lock them up while he is being treated.  Pt states he doesn't want to give up his phone and watch.  Pt was informed that he could subject to our policies or he could leave if he didn't want to undergo treatment with [place].

## 2018-11-07 DIAGNOSIS — F1014 Alcohol abuse with alcohol-induced mood disorder: Secondary | ICD-10-CM

## 2018-11-07 MED ORDER — GABAPENTIN 100 MG PO CAPS: 200.0000 mg | ORAL_CAPSULE | Freq: Two times a day (BID) | ORAL | Status: DC

## 2018-11-07 MED ORDER — GABAPENTIN 100 MG PO CAPS: 200.0000 mg | ORAL_CAPSULE | Freq: Two times a day (BID) | ORAL | 0 refills | Status: DC

## 2018-11-07 NOTE — ED Notes (Signed)
Pt  Fnd calm resting in bed. Upon Clinical research associate assessment pt began exhibiting gross motor shakes and that he wants his medication. Pt made aware that he has medication due at 930.

## 2018-11-07 NOTE — ED Notes (Signed)
Patient is alert and oriented to person, place and time.  Presents with anxious affect and paranoid behavior.  Appears to be guarded and preoccupied.  Reports voices but denies suicidal thoughts and visual hallucination.  Reports withdrawal symptoms of anxiety, tremors, and nausea during assessment.  Medications given as prescribed.  Patient is safe on the unit.

## 2018-11-07 NOTE — Consult Note (Addendum)
[facility] ED Discharge  11/07/2018 11:54 AM Dennis ChurchMichael C [name]  MRN:  [id number] Principal Problem: Alcohol abuse with alcohol-induced mood disorder [facility](HCC) Discharge Diagnoses: Principal Problem:   Alcohol abuse with alcohol-induced mood disorder (HCC)   Subjective: Pt was seen and chart reviewed with treatment team and Dr Jannifer FranklinAkintayo.  Pt denies suicidal/homicidal ideation, denies auditory/visual hallucinations and does not appear to be responding to internal stimuli. Pt stated he lives in [place] but comes to [place] in the winter because it is too cold outside in [place] to work. He stated when he has too much time on his hands, he drinks and sometimes he drinks too much. Yesterday, he drank too much and was hearing voices. His BAL was 342/UDS negative on admission. 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 He also stated he does not take any mental health medications except for medical marijuana. [facility] and discharged on gabapentin, trazodone, and vistaril. He declined any referrals at that time. He stated today he does not have a problem with alcohol and does not want to see Peer Support. Pt will be given a prescription for gabapentin upon discharge in the event he starts to experience any withdrawal symptoms. (Pt did not wait to get his prescription, he left before prescription was printed).  He denies any tremors, sweats, nausea, vomiting or headache today. He is pleasant, alert & oriented x 4, clam and cooperative. He will be referred to [facility] for further medication management. Pt is psychiatrically clear.   Total Time spent with patient: 30 minutes  Past Psychiatric History: As above  Past Medical History:  Past Medical History:  Diagnosis Date  . Active smoker   . Alcohol abuse   . Anxiety   . Bipolar 2 disorder (HCC)   . Depression   . PTSD (post-traumatic stress disorder)     Past Surgical History:  Procedure  Laterality Date  . LYMPH NODE DISSECTION     Family History:  Family History  Problem Relation Age of Onset  . Alcohol abuse Father    Family Psychiatric  History: Pt did not give this information.  Social History:  Social History   Substance and Sexual Activity  Alcohol Use Yes   Comment: Binge drinker, last drink yesterday at 17:00.     Social History   Substance and Sexual Activity  Drug Use Yes  . Types: Marijuana   Comment: Daily. Last used: 2 days ago     Social History   Socioeconomic History  . Marital status: Legally Separated    Spouse name: Not on file  . Number of children: Not on file  . Years of education: Not on file  . Highest education level: Not on file  Occupational History  . Not on file  Social Needs  . Financial resource strain: Not on file  . Food insecurity:    Worry: Not on file    Inability: Not on file  . Transportation needs:    Medical: Not on file    Non-medical: Not on file  Tobacco Use  . Smoking status: Current Every Day Smoker    Packs/day: 2.00    Years: 20.00    Pack years: 40.00    Types: Cigarettes  . Smokeless tobacco: Never Used  Substance and Sexual Activity  . Alcohol use: Yes    Comment: Binge drinker, last drink yesterday at 17:00.  .Marland Kitchen  Drug use: Yes    Types: Marijuana    Comment: Daily. Last used: 2 days ago   . Sexual activity: Yes  Lifestyle  . Physical activity:    Days per week: Not on file    Minutes per session: Not on file  . Stress: Not on file  Relationships  . Social connections:    Talks on phone: Not on file    Gets together: Not on file    Attends religious service: Not on file    Active member of club or organization: Not on file    Attends meetings of clubs or organizations: Not on file    Relationship status: Not on file  Other Topics Concern  . Not on file  Social History Narrative   Has an ex-wife and 213 yo son whom he cannot see now, as his ex-wife divorced him. Was in the [employer]  and [place], and may have some PTSD symptoms from this. Was also a Management consultanthome builder. Heavy alcohol abuse during binges, but has had periods of sobriety for months on end. Has been drinking up to two cases of beer a day    Has this patient used any form of tobacco in the last 30 days? (Cigarettes, Smokeless Tobacco, Cigars, and/or Pipes) Prescription not provided because: Pt declined  Current Medications: Current Facility-Administered Medications  Medication Dose Route Frequency Provider Last Rate Last Dose  . alum & mag hydroxide-simeth (MAALOX/MYLANTA) 200-200-20 MG/5ML suspension 30 mL  30 mL Oral Q6H PRN Street, [place], [place]      . gabapentin (NEURONTIN) capsule 200 mg  200 mg Oral BID Asah Lamay, MD      . ibuprofen (ADVIL,MOTRIN) tablet 600 mg  600 mg Oral Q8H PRN Street, [place], [place]      . LORazepam (ATIVAN) injection 0-4 mg  0-4 mg Intravenous [address], [place], PA-C       Or  . LORazepam (ATIVAN) tablet 0-4 mg  0-4 mg Oral [address], [place], [place]   2 mg at 11/07/18 [id number]  . [START ON 11/09/2018] LORazepam (ATIVAN) injection 0-4 mg  0-4 mg Intravenous Q12H Street, [place], [place]       Or  . Melene Muller[START ON 11/09/2018] LORazepam (ATIVAN) tablet 0-4 mg  0-4 mg Oral [address], [place], [place]      . nicotine polacrilex (NICORETTE) gum 2 mg  2 mg Oral PRN Lorre NickAllen, Anthony, MD   2 mg at 11/07/18 0332  . ondansetron (ZOFRAN) tablet 4 mg  4 mg Oral Q8H PRN Street, [place], [place]   4 mg at 11/07/18 [id number]  . thiamine (VITAMIN B-1) tablet 100 mg  100 mg Oral Daily [address], [place], [place]   100 mg at 11/07/18 [id number]   Or  . thiamine (B-1) injection 100 mg  100 mg Intravenous Daily Street, [place], [place]      . zolpidem (AMBIEN) tablet 5 mg  5 mg Oral QHS PRN Street, [place], [place]       Current Outpatient Medications  Medication Sig Dispense Refill  . chlordiazePOXIDE (LIBRIUM) 25 MG capsule 50mg  PO TID x 1D, then 25-50mg  PO BID X 1D, then 25-50mg  PO QD X 1D (Patient not taking:  Reported on 11/06/2018) 10 capsule 0  . gabapentin (NEURONTIN) 300 MG capsule Take 1 capsule (300 mg total) by mouth 3 (three) times daily. For agitation (Patient not taking: Reported on 03/31/2018) 90 capsule 0  . hydrOXYzine (ATARAX/VISTARIL) 25 MG tablet Take 1 tablet (25 mg) by mouth Q 6 hours  as needed: For anxiety (Patient not taking: Reported on 03/31/2018) 60 tablet 0  . nicotine polacrilex (NICORETTE) 2 MG gum Take 1 each (2 mg total) by mouth as needed for smoking cessation. (Patient not taking: Reported on 03/31/2018) 100 tablet 0  . traZODone (DESYREL) 50 MG tablet Take 1 tablet (50 mg total) by mouth at bedtime as needed for sleep. (Patient not taking: Reported on 03/31/2018) 30 tablet 0    Musculoskeletal: Strength & Muscle Tone: within normal limits Gait & Station: normal Patient leans: N/A  Psychiatric Specialty Exam: Physical Exam  ROS  Blood pressure 130/80, pulse 96, temperature 98.4 F (36.9 C), temperature source Oral, resp. rate 20, height 5\' 10"  (1.778 m), weight 77.1 kg, SpO2 96 %.Body mass index is 24.39 kg/m.  General Appearance: Casual  Eye Contact:  Good  Speech:  Clear and Coherent and Normal Rate  Volume:  Normal  Mood:  Anxious  Affect:  Congruent  Thought Process:  Coherent, Goal Directed, Linear and Descriptions of Associations: Intact  Orientation:  Full (Time, Place, and Person)  Thought Content:  Logical  Suicidal Thoughts:  No  Homicidal Thoughts:  No  Memory:  Immediate;   Good Recent;   Good Remote;   Fair  Judgement:  Fair  Insight:  Fair  Psychomotor Activity:  Normal  Concentration:  Concentration: Good and Attention Span: Good  Recall:  Good  Fund of Knowledge:  Good  Language:  Good  Akathisia:  No  Handed:  Right  AIMS (if indicated):     Assets:  Administrator, arts Vocational/Educational  ADL's:  Intact  Cognition:  WNL  Sleep:        Demographic Factors:   Male and Caucasian  Loss Factors: Financial problems/change in socioeconomic status  Historical Factors: Veteran with reported PTSD  Risk Reduction Factors:   Sense of responsibility to family and Living with another person, especially a relative  Continued Clinical Symptoms:  Alcohol/Substance Abuse/Dependencies  Cognitive Features That Contribute To Risk:  Closed-mindedness    Suicide Risk:  Minimal: No identifiable suicidal ideation.  Patients presenting with no risk factors but with morbid ruminations; may be classified as minimal risk based on the severity of the depressive symptoms   Plan Of Care/Follow-up recommendations:  Activity:  as tolerated Diet:  Heart healthy  Disposition and Treatment Plan: Alcohol abuse with alcohol-induced mood disorder (HCC) Take all medications as prescribed by your outpatient provider.  Keep all follow-up appointments as scheduled. [facility].  Do not consume alcohol or use illegal drugs while on prescription medications. Report any adverse effects from your medications to your primary care provider promptly.  In the event of recurrent symptoms or worsening symptoms, call 911, a crisis hotline, or go to the nearest emergency department for evaluation.   Laveda Abbe, NP 11/07/2018, 11:54 AM  Patient seen face-to-face for psychiatric evaluation, chart reviewed and case discussed with the physician extender and developed treatment plan. Reviewed the information documented and agree with the treatment plan. Thedore Mins, MD

## 2018-11-07 NOTE — ED Notes (Signed)
Pt states that he is feeling better and would like to check himself out. Pt has been restless and coming in and out of room.

## 2018-11-07 NOTE — ED Notes (Signed)
Pt made phone called.

## 2018-11-09 ENCOUNTER — Observation Stay (HOSPITAL_COMMUNITY)
Admission: EM | Admit: 2018-11-09 | Discharge: 2018-11-10 | Discharge status: Against Medical Advice | Payer: Self-pay | Attending: Internal Medicine | Admitting: Internal Medicine

## 2018-11-09 ENCOUNTER — Encounter (HOSPITAL_COMMUNITY): Payer: Self-pay

## 2018-11-09 ENCOUNTER — Other Ambulatory Visit: Payer: Self-pay

## 2018-11-09 DIAGNOSIS — F4310 Post-traumatic stress disorder, unspecified: Secondary | ICD-10-CM | POA: Insufficient documentation

## 2018-11-09 DIAGNOSIS — F10230 Alcohol dependence with withdrawal, uncomplicated: Secondary | ICD-10-CM

## 2018-11-09 DIAGNOSIS — F1010 Alcohol abuse, uncomplicated: Secondary | ICD-10-CM

## 2018-11-09 DIAGNOSIS — F1210 Cannabis abuse, uncomplicated: Secondary | ICD-10-CM | POA: Insufficient documentation

## 2018-11-09 DIAGNOSIS — F17210 Nicotine dependence, cigarettes, uncomplicated: Secondary | ICD-10-CM | POA: Insufficient documentation

## 2018-11-09 DIAGNOSIS — F10239 Alcohol dependence with withdrawal, unspecified: Principal | ICD-10-CM | POA: Insufficient documentation

## 2018-11-09 DIAGNOSIS — F3181 Bipolar II disorder: Secondary | ICD-10-CM | POA: Insufficient documentation

## 2018-11-09 DIAGNOSIS — Z888 Allergy status to other drugs, medicaments and biological substances status: Secondary | ICD-10-CM | POA: Insufficient documentation

## 2018-11-09 DIAGNOSIS — Z7141 Alcohol abuse counseling and surveillance of alcoholic: Secondary | ICD-10-CM | POA: Insufficient documentation

## 2018-11-09 DIAGNOSIS — Z7151 Drug abuse counseling and surveillance of drug abuser: Secondary | ICD-10-CM | POA: Insufficient documentation

## 2018-11-09 DIAGNOSIS — F10939 Alcohol use, unspecified with withdrawal, unspecified: Secondary | ICD-10-CM | POA: Diagnosis present

## 2018-11-09 DIAGNOSIS — F10229 Alcohol dependence with intoxication, unspecified: Secondary | ICD-10-CM | POA: Insufficient documentation

## 2018-11-09 DIAGNOSIS — F10930 Alcohol use, unspecified with withdrawal, uncomplicated: Secondary | ICD-10-CM

## 2018-11-09 DIAGNOSIS — Z79899 Other long term (current) drug therapy: Secondary | ICD-10-CM | POA: Insufficient documentation

## 2018-11-09 LAB — COMPREHENSIVE METABOLIC PANEL WITH GFR
ALT: 31 U/L (ref 0–44)
AST: 36 U/L (ref 15–41)
Albumin: 4.1 g/dL (ref 3.5–5.0)
Alkaline Phosphatase: 56 U/L (ref 38–126)
Anion gap: 14 (ref 5–15)
BUN: 11 mg/dL (ref 6–20)
CO2: 24 mmol/L (ref 22–32)
Calcium: 8.6 mg/dL — ABNORMAL LOW (ref 8.9–10.3)
Chloride: 106 mmol/L (ref 98–111)
Creatinine, Ser: 0.71 mg/dL (ref 0.61–1.24)
GFR calc Af Amer: >60 mL/min (ref >60)
GFR calc non Af Amer: >60 mL/min (ref >60)
Glucose, Bld: 99 mg/dL (ref 70–99)
Potassium: 4.1 mmol/L (ref 3.5–5.1)
Sodium: 144 mmol/L (ref 135–145)
Total Bilirubin: 0.7 mg/dL (ref 0.3–1.2)
Total Protein: 7.3 g/dL (ref 6.5–8.1)

## 2018-11-09 LAB — URINALYSIS, ROUTINE W REFLEX MICROSCOPIC
Bilirubin Urine: NEGATIVE
Glucose, UA: NEGATIVE mg/dL
HGB URINE DIPSTICK: NEGATIVE
Ketones, ur: NEGATIVE mg/dL
Leukocytes, UA: NEGATIVE
Nitrite: NEGATIVE
Protein, ur: NEGATIVE mg/dL
Specific Gravity, Urine: 1.006 (ref 1.005–1.030)
pH: 5 (ref 5.0–8.0)

## 2018-11-09 LAB — CBC
HCT: 45 % (ref 39.0–52.0)
Hemoglobin: 14.8 g/dL (ref 13.0–17.0)
MCH: 26.7 pg (ref 26.0–34.0)
MCHC: 32.9 g/dL (ref 30.0–36.0)
MCV: 81.2 fL (ref 80.0–100.0)
Platelets: 282 K/uL (ref 150–400)
RBC: 5.54 MIL/uL (ref 4.22–5.81)
RDW: 15.2 % (ref 11.5–15.5)
WBC: 6.3 K/uL (ref 4.0–10.5)
nRBC: 0 % (ref 0.0–0.2)

## 2018-11-09 LAB — RAPID URINE DRUG SCREEN, HOSP PERFORMED
Amphetamines: NOT DETECTED
Barbiturates: NOT DETECTED
Benzodiazepines: NOT DETECTED
Cocaine: NOT DETECTED
Opiates: NOT DETECTED
Tetrahydrocannabinol: POSITIVE — AB

## 2018-11-09 LAB — MAGNESIUM: Magnesium: 1.8 mg/dL (ref 1.7–2.4)

## 2018-11-09 LAB — PHOSPHORUS: Phosphorus: 3.8 mg/dL (ref 2.5–4.6)

## 2018-11-09 LAB — TSH: TSH: 1.069 u[IU]/mL (ref 0.350–4.500)

## 2018-11-09 LAB — ETHANOL: Alcohol, Ethyl (B): 192 mg/dL — ABNORMAL HIGH (ref <10)

## 2018-11-09 LAB — LACTIC ACID, PLASMA: Lactic Acid, Venous: 1.1 mmol/L (ref 0.5–1.9)

## 2018-11-09 MED ORDER — THIAMINE HCL 100 MG/ML IJ SOLN
100.0000 mg | Freq: Every day | INTRAMUSCULAR | Status: DC
  Administered 2018-11-09: 100 mg via INTRAVENOUS
  Filled 2018-11-09: qty 2

## 2018-11-09 MED ORDER — LORAZEPAM 2 MG/ML IJ SOLN
1.0000 mg | Freq: Once | INTRAMUSCULAR | Status: AC
  Administered 2018-11-09: 1 mg via INTRAVENOUS
  Filled 2018-11-09: qty 1

## 2018-11-09 MED ORDER — VITAMIN B-1 100 MG PO TABS: 100.0000 mg | ORAL_TABLET | Freq: Every day | ORAL | Status: DC

## 2018-11-09 MED ORDER — LORAZEPAM 2 MG/ML IJ SOLN: 0.0000 mg | Freq: Two times a day (BID) | INTRAMUSCULAR | Status: DC

## 2018-11-09 MED ORDER — LORAZEPAM 1 MG PO TABS: 0.0000 mg | ORAL_TABLET | Freq: Four times a day (QID) | ORAL | Status: DC

## 2018-11-09 MED ORDER — LORAZEPAM 2 MG/ML IJ SOLN
0.0000 mg | Freq: Four times a day (QID) | INTRAMUSCULAR | Status: DC
  Administered 2018-11-09 – 2018-11-10 (×4): 2 mg via INTRAVENOUS
  Filled 2018-11-09 (×4): qty 1

## 2018-11-09 MED ORDER — SODIUM CHLORIDE 0.9 % IV BOLUS
1000.0000 mL | Freq: Once | INTRAVENOUS | Status: AC
  Administered 2018-11-09: 1000 mL via INTRAVENOUS

## 2018-11-09 MED ORDER — LORAZEPAM 1 MG PO TABS: 0.0000 mg | ORAL_TABLET | Freq: Two times a day (BID) | ORAL | Status: DC

## 2018-11-09 NOTE — ED Triage Notes (Signed)
Pt presents for evaluation of seizures related to alcohol withdrawal.

## 2018-11-09 NOTE — H&P (Signed)
History and Physical  Dennis ChurchMichael C [name] ION:[id number] DOB: 11/10/1970 DOA: 11/09/2018  Referring physician: ER physician PCP: Patient, No Pcp Per  Outpatient Specialists:    Patient coming from: Home.  Chief Complaint: Concerns for possible alcohol withdrawal and seizure.  HPI:  Patient is a 48 year old male who tells me that he has been an alcoholic all his life, with past medical history significant for PTSD, depression, bipolar, anxiety and tobacco use discontinues.  Patient continues to drink alcohol.  Patient tells me that she has been drinking 18 bottles of beers lately.  Patient reports last alcohol intake was earlier  today.  Patient presented to the ER with concerns for seizure and alcohol withdrawal!!  Patient is not a particularly good historian.  Patient just keeps telling me that it was some have another seizure.  Only, patient also says sent to the nursing staff.  Alcohol level on presentation was 192, but this was 342 3 days ago.  Tested positive for tetrahydrocannabinol.  No headache, neck pain, no fever or chills, no chest pain, no shortness of breath, no GI symptoms and no urinary symptoms.    ED Course: Patient is currently on [place] and seizure protocol. Pertinent labs: CBC reveals WBC of 6.3, hemoglobin of 14.8, hematocrit of 45, MCV of 81.2 platelet count of 283.  Chemistry reveals sodium of 144, potassium of 4.1, chloride 106, CO2 of 24, BUN of 11, creatinine of 0.7 with blood sugar of 99.  ALT is 3 31, AST of 36.  As documented above, alcohol level was 192 today.  Drug screening was positive for tetrahydrocannabinol. EKG: Independently reviewed.  Imaging: independently reviewed.   Review of Systems: Negative for fever, visual changes, sore throat, rash, new muscle aches, chest pain, SOB, dysuria, bleeding, n/v/abdominal pain.  Past Medical History:  Diagnosis Date  . Active smoker   . Alcohol abuse   . Anxiety   . Bipolar 2 disorder (HCC)   . Depression   . PTSD  (post-traumatic stress disorder)     Past Surgical History:  Procedure Laterality Date  . LYMPH NODE DISSECTION       reports that he has been smoking cigarettes. He has a 40.00 pack-year smoking history. He has never used smokeless tobacco. He reports current alcohol use. He reports current drug use. Drug: Marijuana.  Allergies  Allergen Reactions  . Paxil [Paroxetine Hcl] Hives    Family History  Problem Relation Age of Onset  . Alcohol abuse Father      Prior to Admission medications   Medication Sig Start Date End Date Taking? Authorizing Provider  gabapentin (NEURONTIN) 100 MG capsule Take 2 capsules (200 mg total) by mouth 2 (two) times daily. 11/07/18  Yes Laveda AbbeParks, Laurie Britton, NP  chlordiazePOXIDE (LIBRIUM) 25 MG capsule 50mg  PO TID x 1D, then 25-50mg  PO BID X 1D, then 25-50mg  PO QD X 1D Patient not taking: Reported on 11/06/2018 04/04/18   Elpidio AnisUpstill, Shari, PA-C  hydrOXYzine (ATARAX/VISTARIL) 25 MG tablet Take 1 tablet (25 mg) by mouth Q 6 hours as needed: For anxiety Patient not taking: Reported on 03/31/2018 02/18/18   Armandina StammerNwoko, Agnes I, NP  nicotine polacrilex (NICORETTE) 2 MG gum Take 1 each (2 mg total) by mouth as needed for smoking cessation. Patient not taking: Reported on 03/31/2018 02/18/18   Armandina StammerNwoko, Agnes I, NP  traZODone (DESYREL) 50 MG tablet Take 1 tablet (50 mg total) by mouth at bedtime as needed for sleep. Patient not taking: Reported on 03/31/2018 02/18/18   Nwoko,  Nelda Marseille, NP    Physical Exam: Vitals:   11/09/18 1350 11/09/18 1400 11/09/18 1415 11/09/18 1430  BP: 135/76 131/76 111/71 123/74  Pulse: (!) 120 96 99 94  Resp:  15 15 16   Temp:      TempSrc:      SpO2:  94% 95% 95%  Weight:      Height:        Constitutional:  . Appears comfortable Eyes:  . No pallor. No jaundice.  ENMT:  . external ears, nose appear normal Neck:  . Neck is supple. No JVD Respiratory:  . CTA bilaterally, no w/r/r.  . Respiratory effort normal. No retractions or  accessory muscle use Cardiovascular:  . S1S2 . No LE extremity edema   Abdomen:  . Abdomen is soft and non tender. Organs are difficult to assess. Neurologic:  . Awake and alert. . Moves all limbs.  Wt Readings from Last 3 Encounters:  11/09/18 74.8 kg  11/06/18 77.1 kg  04/03/18 77.1 kg    I have personally reviewed following labs and imaging studies  Labs on Admission:  CBC: Recent Labs  Lab 11/06/18 2011 11/09/18 0916  WBC 7.7 6.3  NEUTROABS 3.1  --   HGB 15.6 14.8  HCT 48.6 45.0  MCV 82.5 81.2  PLT 308 282   Basic Metabolic Panel: Recent Labs  Lab 11/06/18 2011 11/09/18 0916  NA 142 144  K 3.8 4.1  CL 106 106  CO2 24 24  GLUCOSE 93 99  BUN 10 11  CREATININE 0.71 0.71  CALCIUM 8.8* 8.6*   Liver Function Tests: Recent Labs  Lab 11/06/18 2011 11/09/18 0916  AST 40 36  ALT 35 31  ALKPHOS 60 56  BILITOT 0.5 0.7  PROT 8.5* 7.3  ALBUMIN 4.7 4.1   No results for input(s): LIPASE, AMYLASE in the last 168 hours. No results for input(s): AMMONIA in the last 168 hours. Coagulation Profile: No results for input(s): INR, PROTIME in the last 168 hours. Cardiac Enzymes: No results for input(s): CKTOTAL, CKMB, CKMBINDEX, TROPONINI in the last 168 hours. BNP (last 3 results) No results for input(s): PROBNP in the last 8760 hours. HbA1C: No results for input(s): HGBA1C in the last 72 hours. CBG: No results for input(s): GLUCAP in the last 168 hours. Lipid Profile: No results for input(s): CHOL, HDL, LDLCALC, TRIG, CHOLHDL, LDLDIRECT in the last 72 hours. Thyroid Function Tests: No results for input(s): TSH, T4TOTAL, FREET4, T3FREE, THYROIDAB in the last 72 hours. Anemia Panel: No results for input(s): VITAMINB12, FOLATE, FERRITIN, TIBC, IRON, RETICCTPCT in the last 72 hours. Urine analysis:    Component Value Date/Time   COLORURINE STRAW (A) 11/09/2018 0916   APPEARANCEUR CLEAR 11/09/2018 0916   LABSPEC 1.006 11/09/2018 0916   PHURINE 5.0 11/09/2018  0916   GLUCOSEU NEGATIVE 11/09/2018 0916   HGBUR NEGATIVE 11/09/2018 0916   BILIRUBINUR NEGATIVE 11/09/2018 0916   KETONESUR NEGATIVE 11/09/2018 0916   PROTEINUR NEGATIVE 11/09/2018 0916   NITRITE NEGATIVE 11/09/2018 0916   LEUKOCYTESUR NEGATIVE 11/09/2018 0916   Sepsis Labs: @LABRCNTIP (procalcitonin:4,lacticidven:4) )No results found for this or any previous visit (from the past 240 hour(s)).    Radiological Exams on Admission: No results found.  EKG: Independently reviewed.   Active Problems:   Alcohol withdrawal (HCC)   Assessment/Plan Alcohol abuse, continuous: Cannot rule out alcohol withdrawal syndrome. CIWA Protocol Vitamins Seizure precautions. Patient was counseled.  Possible alcohol withdrawal syndrome: Manage as above.  Cannabis abuse: Counseled.  DVT prophylaxis:SCD Code Status:  Full code Family Communication:  Disposition Plan: Depends on hospital course Consults called: None Admission status: Observation  Time spent: 65 minutes.  Berton MountSylvester Ramadan Couey, MD  [facility] Pager #: [phone] 7PM-7AM contact night coverage as above  11/09/2018, 2:45 PM

## 2018-11-09 NOTE — ED Triage Notes (Signed)
Pt arrives statign "im gonna seize. " Pt states he has been drinking heavily this week and drank at 0600 today to stop potential seizure.. Slurred speech noted.

## 2018-11-09 NOTE — ED Provider Notes (Signed)
[facility] Emergency Department Provider Note MRN:  [id number]  Arrival date & time: 11/09/18     Chief Complaint   Seizures and Alcohol Intoxication   History of Present Illness   [name] is a 48 y.o. year-old male with a history of bipolar disorder, alcohol abuse presenting to the ED with chief complaint of tremulousness.  Patient explains that he has been drinking heavily for the past several weeks, drink 30 beers yesterday.  Woke up feeling like he was withdrawing, then had a sensation that made him feel like he was going to have a seizure, similar to a sensation has had in the past when he had withdrawal seizures.  Drank 1 beer this morning at 6 AM to prevent seizure, came here for further evaluation.  Patient denies headache or vision change, no numbness weakness to the arms or legs, no chest pain, no abdominal pain, no shortness of breath. Review of Systems  A complete 10 system review of systems was obtained and all systems are negative except as noted in the HPI and PMH.   Patient's Health History    Past Medical History:  Diagnosis Date  . Active smoker   . Alcohol abuse   . Anxiety   . Bipolar 2 disorder (HCC)   . Depression   . PTSD (post-traumatic stress disorder)     Past Surgical History:  Procedure Laterality Date  . LYMPH NODE DISSECTION      Family History  Problem Relation Age of Onset  . Alcohol abuse Father     Social History   Socioeconomic History  . Marital status: Legally Separated    Spouse name: Not on file  . Number of children: Not on file  . Years of education: Not on file  . Highest education level: Not on file  Occupational History  . Not on file  Social Needs  . Financial resource strain: Not on file  . Food insecurity:    Worry: Not on file    Inability: Not on file  . Transportation needs:    Medical: Not on file    Non-medical: Not on file  Tobacco Use  . Smoking status: Current Every Day Smoker     Packs/day: 2.00    Years: 20.00    Pack years: 40.00    Types: Cigarettes  . Smokeless tobacco: Never Used  Substance and Sexual Activity  . Alcohol use: Yes    Comment: Binge drinker, last drink yesterday at 17:00.  . Drug use: Yes    Types: Marijuana    Comment: Daily. Last used: 2 days ago   . Sexual activity: Yes  Lifestyle  . Physical activity:    Days per week: Not on file    Minutes per session: Not on file  . Stress: Not on file  Relationships  . Social connections:    Talks on phone: Not on file    Gets together: Not on file    Attends religious service: Not on file    Active member of club or organization: Not on file    Attends meetings of clubs or organizations: Not on file    Relationship status: Not on file  . Intimate partner violence:    Fear of current or ex partner: Not on file    Emotionally abused: Not on file    Physically abused: Not on file    Forced sexual activity: Not on file  Other Topics Concern  . Not on file  Social History Narrative   Has an ex-wife and 60 yo son whom he cannot see now, as his ex-wife divorced him. Was in the [employer] and went to [place], [place], and may have some PTSD symptoms from this. Was also a Management consultant. Heavy alcohol abuse during binges, but has had periods of sobriety for months on end. Has been drinking up to two cases of beer a day     Physical Exam  Vital Signs and Nursing Notes reviewed Vitals:   11/09/18 0930 11/09/18 0946  BP: 123/78 (!) 131/97  Pulse: 97   Resp: 19   Temp:    SpO2: 97%     CONSTITUTIONAL: Well-appearing, NAD NEURO:  Alert and oriented x 3, no focal deficits, mildly tremulous EYES:  eyes equal and reactive ENT/NECK:  no LAD, no JVD CARDIO: Tachycardic rate, well-perfused, normal S1 and S2 PULM:  CTAB no wheezing or rhonchi GI/GU:  normal bowel sounds, non-distended, non-tender MSK/SPINE:  No gross deformities, no edema SKIN:  no rash, atraumatic PSYCH:  Appropriate speech  and behavior  Diagnostic and Interventional Summary    EKG Interpretation  Date/Time:  Tuesday November 09 2018 09:25:41 EST Ventricular Rate:  95 PR Interval:    QRS Duration: 99 QT Interval:  353 QTC Calculation: 444 R Axis:   9 Text Interpretation:  Sinus rhythm Confirmed by Kennis Carina [phone]) on 11/09/2018 10:43:23 AM      Labs Reviewed  COMPREHENSIVE METABOLIC PANEL - Abnormal; Notable for the following components:      Result Value   Calcium 8.6 (*)    All other components within normal limits  ETHANOL - Abnormal; Notable for the following components:   Alcohol, Ethyl (B) 192 (*)    All other components within normal limits  URINALYSIS, ROUTINE W REFLEX MICROSCOPIC - Abnormal; Notable for the following components:   Color, Urine STRAW (*)    All other components within normal limits  RAPID URINE DRUG SCREEN, HOSP PERFORMED - Abnormal; Notable for the following components:   Tetrahydrocannabinol POSITIVE (*)    All other components within normal limits  CBC  LACTIC ACID, PLASMA    No orders to display    Medications  LORazepam (ATIVAN) injection 0-4 mg (0 mg Intravenous Not Given 11/09/18 0946)    Or  LORazepam (ATIVAN) tablet 0-4 mg ( Oral See Alternative 11/09/18 0946)  LORazepam (ATIVAN) injection 0-4 mg (has no administration in time range)    Or  LORazepam (ATIVAN) tablet 0-4 mg (has no administration in time range)  thiamine (VITAMIN B-1) tablet 100 mg ( Oral See Alternative 11/09/18 0945)    Or  thiamine (B-1) injection 100 mg (100 mg Intravenous Given 11/09/18 0945)  sodium chloride 0.9 % bolus 1,000 mL (1,000 mLs Intravenous New Bag/Given 11/09/18 0945)  LORazepam (ATIVAN) injection 1 mg (1 mg Intravenous Given 11/09/18 0945)     Procedures Critical Care  ED Course and Medical Decision Making  I have reviewed the triage vital signs and the nursing notes.  Pertinent labs & imaging results that were available during my care of the patient were reviewed by me  and considered in my medical decision making (see below for details).  Anticipating admission for active alcohol withdrawal in this 48 year old male with history of delirium tremens, withdrawal seizures.  CIWA score currently about 10, will provide IV Ativan.  IV Ativan given here in the emergency department, admitted to hospital service for further care.  Elmer Sow. Pilar Plate, MD [facility] Emergency Medicine [facility]  [facility] [email or web address]  Final Clinical Impressions(s) / ED Diagnoses     ICD-10-CM   1. Alcohol withdrawal syndrome without complication [facility](HCC) F10.230     ED Discharge Orders    None         Sabas SousBero, Apolinar M, MD 11/09/18 [phone]

## 2018-11-10 LAB — CBC
HCT: 43.5 % (ref 39.0–52.0)
Hemoglobin: 14 g/dL (ref 13.0–17.0)
MCH: 25.9 pg — ABNORMAL LOW (ref 26.0–34.0)
MCHC: 32.2 g/dL (ref 30.0–36.0)
MCV: 80.4 fL (ref 80.0–100.0)
Platelets: 255 10*3/uL (ref 150–400)
RBC: 5.41 MIL/uL (ref 4.22–5.81)
RDW: 15 % (ref 11.5–15.5)
WBC: 6 10*3/uL (ref 4.0–10.5)
nRBC: 0 % (ref 0.0–0.2)

## 2018-11-10 LAB — BASIC METABOLIC PANEL
Anion gap: 6 (ref 5–15)
BUN: 9 mg/dL (ref 6–20)
CO2: 29 mmol/L (ref 22–32)
Calcium: 9.4 mg/dL (ref 8.9–10.3)
Chloride: 106 mmol/L (ref 98–111)
Creatinine, Ser: 0.77 mg/dL (ref 0.61–1.24)
GFR calc Af Amer: >60 mL/min (ref >60)
GFR calc non Af Amer: >60 mL/min (ref >60)
Glucose, Bld: 97 mg/dL (ref 70–99)
Potassium: 3.9 mmol/L (ref 3.5–5.1)
Sodium: 141 mmol/L (ref 135–145)

## 2018-11-10 LAB — HIV ANTIBODY (ROUTINE TESTING W REFLEX): HIV Screen 4th Generation wRfx: NONREACTIVE

## 2018-11-10 NOTE — Discharge Summary (Signed)
PATIENT DETAILS Name: [name] Age: 48 y.o. Sex: male Date of Birth: January 28, 1971 MRN: [id number]. Admitting Physician: Barnetta ChapelSylvester I Ogbata, MD NWG:[id number]:Patient, No Pcp Per  Admit Date: 11/09/2018 Discharge date: 11/10/2018  Note:patient left AMA  Recommendations for Outpatient Follow-up:  1. Repeat CBC and chemistry panel in 1 week 2. Continue outpatient attempts at counseling regarding abstinence from alcohol  PRIMARY DISCHARGE DIAGNOSIS:  Active Problems:   Alcohol withdrawal (HCC)      PAST MEDICAL HISTORY: Past Medical History:  Diagnosis Date  . Active smoker   . Alcohol abuse   . Anxiety   . Bipolar 2 disorder (HCC)   . Depression   . PTSD (post-traumatic stress disorder)     ALLERGIES:   Allergies  Allergen Reactions  . Paxil [Paroxetine Hcl] Hives    BRIEF HPI:  See H&P, Labs, Consult and Test reports for all details in brief, patient is a 48 year old male who presented with alcohol withdrawal  BRIEF HOSPITAL COURSE:  Alcohol withdrawal: Awake-alert but still tremulous, this morning when I saw the patient-he claimed he had to leave the hospital right away as [place]  This MD advised patient to remain hospitalized-refrain from driving-however he was adamant on leaving and left AGAINST MEDICAL ADVICE.  Patient was advised of the risks of leaving AGAINST MEDICAL ADVICE including life-threatening and life disabling effects.  He accepted all risks and left AMA.  CONSULTATIONS:   None  PERTINENT RADIOLOGIC STUDIES: No results found.   PERTINENT LAB RESULTS: CBC: Recent Labs    11/09/18 0916 11/10/18 0428  WBC 6.3 6.0  HGB 14.8 14.0  HCT 45.0 43.5  PLT 282 255   CMET CMP     Component Value Date/Time   NA 141 11/10/2018 0428   K 3.9 11/10/2018 0428   CL 106 11/10/2018 0428   CO2 29 11/10/2018 0428   GLUCOSE 97 11/10/2018 0428   BUN 9 11/10/2018 0428   CREATININE 0.77 11/10/2018 0428   CREATININE 0.90 10/14/2013 1546   CALCIUM 9.4 11/10/2018 0428   PROT 7.3 11/09/2018 0916   ALBUMIN 4.1 11/09/2018 0916   AST 36 11/09/2018 0916   ALT 31 11/09/2018 0916   ALKPHOS 56 11/09/2018 0916   BILITOT 0.7 11/09/2018 0916   GFRNONAA >60 11/10/2018 0428   GFRNONAA >89 10/14/2013 1546   GFRAA >60 11/10/2018 0428   GFRAA >89 10/14/2013 1546    GFR Estimated Creatinine Clearance: 119.1 mL/min (by C-G formula based on SCr of 0.77 mg/dL). No results for input(s): LIPASE, AMYLASE in the last 72 hours. No results for input(s): CKTOTAL, CKMB, CKMBINDEX, TROPONINI in the last 72 hours. Invalid input(s): POCBNP No results for input(s): DDIMER in the last 72 hours. No results for input(s): HGBA1C in the last 72 hours. No results for input(s): CHOL, HDL, LDLCALC, TRIG, CHOLHDL, LDLDIRECT in the last 72 hours. Recent Labs    11/09/18 1805  TSH 1.069   No results for input(s): VITAMINB12, FOLATE, FERRITIN, TIBC, IRON, RETICCTPCT in the last 72 hours. Coags: No results for input(s): INR in the last 72 hours.  Invalid input(s): PT Microbiology: No results found for this or any previous visit (from the past 240 hour(s)).   TODAY-DAY OF DISCHARGE:  Subjective:   [name] today has signed out against medical advice. He was warned about the life threatening and life disabling effects by MD and RN.   Objective:   Blood pressure 113/73, pulse 69,  temperature 98.4 F (36.9 C), temperature source Oral, resp. rate 17, height 5\' 10"  (1.778 m), weight 74.8 kg, SpO2 96 %.   DISCHARGE CONDITION: Not stable for discharge-left AMA  DISPOSITION: AMA  Follow with your PCP in 1 week   Total Time spent on discharge equals 25  minutes.  SignedJeoffrey Massed 11/10/2018 9:33 AM

## 2018-11-10 NOTE — Progress Notes (Signed)
Patient wanting to leave [facility] Doctor aware

## 2018-11-24 ENCOUNTER — Encounter (HOSPITAL_COMMUNITY): Payer: Self-pay | Admitting: Emergency Medicine

## 2018-11-24 ENCOUNTER — Other Ambulatory Visit: Payer: Self-pay

## 2018-11-24 DIAGNOSIS — F17210 Nicotine dependence, cigarettes, uncomplicated: Secondary | ICD-10-CM | POA: Insufficient documentation

## 2018-11-24 DIAGNOSIS — F1010 Alcohol abuse, uncomplicated: Secondary | ICD-10-CM | POA: Insufficient documentation

## 2018-11-24 DIAGNOSIS — Y908 Blood alcohol level of 240 mg/100 ml or more: Secondary | ICD-10-CM | POA: Insufficient documentation

## 2018-11-24 NOTE — ED Triage Notes (Addendum)
Patient reports binge drinking x1 week. Reports drinking approximately 30 beers today. Requesting alcohol detox. Denies N/V/D. Admitted for withdrawal on 2/4. Hx PTSD. Denies SI/HI.

## 2018-11-25 ENCOUNTER — Emergency Department (HOSPITAL_COMMUNITY)
Admission: EM | Admit: 2018-11-25 | Discharge: 2018-11-25 | Disposition: A | Discharge status: Home / Self Care | Payer: Self-pay | Attending: Emergency Medicine | Admitting: Emergency Medicine

## 2018-11-25 DIAGNOSIS — F1010 Alcohol abuse, uncomplicated: Secondary | ICD-10-CM

## 2018-11-25 LAB — RAPID URINE DRUG SCREEN, HOSP PERFORMED
AMPHETAMINES: NOT DETECTED
Barbiturates: NOT DETECTED
Benzodiazepines: NOT DETECTED
Cocaine: NOT DETECTED
Opiates: NOT DETECTED
Tetrahydrocannabinol: NOT DETECTED

## 2018-11-25 LAB — COMPREHENSIVE METABOLIC PANEL
ALT: 22 U/L (ref 0–44)
AST: 35 U/L (ref 15–41)
Albumin: 4.4 g/dL (ref 3.5–5.0)
Alkaline Phosphatase: 74 U/L (ref 38–126)
Anion gap: 13 (ref 5–15)
BILIRUBIN TOTAL: 0.4 mg/dL (ref 0.3–1.2)
BUN: 12 mg/dL (ref 6–20)
CO2: 23 mmol/L (ref 22–32)
Calcium: 8.5 mg/dL — ABNORMAL LOW (ref 8.9–10.3)
Chloride: 106 mmol/L (ref 98–111)
Creatinine, Ser: 0.77 mg/dL (ref 0.61–1.24)
GFR calc Af Amer: >60 mL/min (ref >60)
Glucose, Bld: 82 mg/dL (ref 70–99)
Potassium: 3.8 mmol/L (ref 3.5–5.1)
Sodium: 142 mmol/L (ref 135–145)
TOTAL PROTEIN: 8.1 g/dL (ref 6.5–8.1)

## 2018-11-25 LAB — CBC
HCT: 46.7 % (ref 39.0–52.0)
Hemoglobin: 15 g/dL (ref 13.0–17.0)
MCH: 26.8 pg (ref 26.0–34.0)
MCHC: 32.1 g/dL (ref 30.0–36.0)
MCV: 83.4 fL (ref 80.0–100.0)
Platelets: 290 10*3/uL (ref 150–400)
RBC: 5.6 MIL/uL (ref 4.22–5.81)
RDW: 16.7 % — ABNORMAL HIGH (ref 11.5–15.5)
WBC: 9.4 10*3/uL (ref 4.0–10.5)
nRBC: 0 % (ref 0.0–0.2)

## 2018-11-25 LAB — ETHANOL: Alcohol, Ethyl (B): 252 mg/dL — ABNORMAL HIGH (ref <10)

## 2018-11-25 MED ORDER — CHLORDIAZEPOXIDE HCL 25 MG PO CAPS: ORAL_CAPSULE | ORAL | 0 refills | Status: DC

## 2018-11-25 MED ORDER — SODIUM CHLORIDE 0.9 % IV BOLUS (SEPSIS)
1000.0000 mL | Freq: Once | INTRAVENOUS | Status: AC
  Administered 2018-11-25: 1000 mL via INTRAVENOUS

## 2018-11-25 MED ORDER — LORAZEPAM 2 MG/ML IJ SOLN
1.0000 mg | Freq: Once | INTRAMUSCULAR | Status: AC
  Administered 2018-11-25: 1 mg via INTRAVENOUS
  Filled 2018-11-25: qty 1

## 2018-11-25 NOTE — ED Provider Notes (Signed)
[facility]-EMERGENCY DEPT Provider Note   CSN: [id number] Arrival date & time: 11/24/18  2005    History   Chief Complaint Chief Complaint  Patient presents with  . Alcohol Intoxication    HPI [name] is a 48 y.o. male.     The history is provided by the patient.  Alcohol Intoxication  This is a new problem. The problem occurs constantly. The problem has been gradually improving. Pertinent negatives include no abdominal pain and no headaches. Nothing aggravates the symptoms. Nothing relieves the symptoms.  Patient with history of alcohol abuse, anxiety presents with alcohol intoxication.  He reports been drinking for about 1-1/2 weeks.  He reports he drinks 30 beers a day.  He is requesting detox.  Reports he has been admitted for withdrawal before.  Reports a history of seizures. No SI or HI  Past Medical History:  Diagnosis Date  . Active smoker   . Alcohol abuse   . Anxiety   . Bipolar 2 disorder (HCC)   . Depression   . PTSD (post-traumatic stress disorder)     Patient Active Problem List   Diagnosis Date Noted  . Alcohol abuse with alcohol-induced mood disorder (HCC) 11/07/2018  . 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 EtOH dependence (HCC) 12/11/2017  . Alcohol withdrawal (HCC) 12/10/2017  . Seizure due to alcohol withdrawal (HCC) 08/30/2017  . Alcohol withdrawal seizure (HCC) 08/30/2017  . Tobacco abuse   . Delirium tremens (HCC) 03/02/2017  . GI bleed 10/06/2013  . Marijuana abuse 10/06/2013  . Suicidal ideation 10/05/2013  . Alcohol abuse 10/26/2011  . Depression 10/26/2011    Past Surgical History:  Procedure Laterality Date  . LYMPH NODE DISSECTION          Home Medications    Prior to Admission medications   Medication Sig Start Date End Date Taking? Authorizing Provider  chlordiazePOXIDE (LIBRIUM) 25 MG capsule 50mg  PO TID x 1D, then 25-50mg  PO BID X 1D, then  25-50mg  PO QD X 1D Patient not taking: Reported on 11/06/2018 04/04/18   Elpidio Anis, PA-C  gabapentin (NEURONTIN) 100 MG capsule Take 2 capsules (200 mg total) by mouth 2 (two) times daily. Patient not taking: Reported on 11/25/2018 11/07/18   Laveda Abbe, NP  hydrOXYzine (ATARAX/VISTARIL) 25 MG tablet Take 1 tablet (25 mg) by mouth Q 6 hours as needed: For anxiety Patient not taking: Reported on 03/31/2018 02/18/18   Armandina Stammer I, NP  nicotine polacrilex (NICORETTE) 2 MG gum Take 1 each (2 mg total) by mouth as needed for smoking cessation. Patient not taking: Reported on 03/31/2018 02/18/18   Armandina Stammer I, NP  traZODone (DESYREL) 50 MG tablet Take 1 tablet (50 mg total) by mouth at bedtime as needed for sleep. Patient not taking: Reported on 03/31/2018 02/18/18   Sanjuana Kava, NP    Family History Family History  Problem Relation Age of Onset  . Alcohol abuse Father     Social History Social History   Tobacco Use  . Smoking status: Current Every Day Smoker    Packs/day: 2.00    Years: 20.00    Pack years: 40.00    Types: Cigarettes  . Smokeless tobacco: Never Used  Substance Use Topics  . Alcohol use: Yes    Comment: Binge drinker, last drink yesterday at 17:00.  . Drug use: Yes    Types: Marijuana    Comment: Daily. Last used: 2 days ago  Allergies   Paxil [paroxetine hcl]   Review of Systems Review of Systems  Constitutional: Negative for fever.  HENT: Positive for sore throat.   Respiratory: Positive for cough.   Gastrointestinal: Negative for abdominal pain.  Neurological: Positive for tremors. Negative for headaches.  All other systems reviewed and are negative.    Physical Exam Updated Vital Signs BP 94/69   Pulse 97   Temp 98.7 F (37.1 C) (Oral)   Resp 20   SpO2 92%   Physical Exam CONSTITUTIONAL: Anxious and disheveled HEAD: Normocephalic/atraumatic EYES: EOMI/PERRL ENMT: Mucous membranes moist NECK: supple no meningeal  signs SPINE/BACK:entire spine nontender CV: S1/S2 noted, no murmurs/rubs/gallops noted LUNGS: Lungs are clear to auscultation bilaterally, no apparent distress ABDOMEN: soft, nontender, no rebound or guarding, bowel sounds noted throughout abdomen GU:no cva tenderness NEURO: Pt is awake/alert/appropriate, moves all extremitiesx4.  No facial droop.  Mild tremor noted EXTREMITIES: pulses normal/equal, full ROM SKIN: warm, color normal PSYCH: Anxious   ED Treatments / Results  Labs (all labs ordered are listed, but only abnormal results are displayed) Labs Reviewed  COMPREHENSIVE METABOLIC PANEL - Abnormal; Notable for the following components:      Result Value   Calcium 8.5 (*)    All other components within normal limits  ETHANOL - Abnormal; Notable for the following components:   Alcohol, Ethyl (B) 252 (*)    All other components within normal limits  CBC - Abnormal; Notable for the following components:   RDW 16.7 (*)    All other components within normal limits  RAPID URINE DRUG SCREEN, HOSP PERFORMED    EKG None  Radiology No results found.  Procedures Procedures  Medications Ordered in ED Medications  sodium chloride 0.9 % bolus 1,000 mL (0 mLs Intravenous Stopped 11/25/18 0420)  LORazepam (ATIVAN) injection 1 mg (1 mg Intravenous Given 11/25/18 0336)     Initial Impression / Assessment and Plan / ED Course  I have reviewed the triage vital signs and the nursing notes.  Pertinent labs results that were available during my care of the patient were reviewed by me and considered in my medical decision making (see chart for details).        3:50 AM  Pt presents for ETOH withdrawal Mental status appropriate Mild tremor noted Iv fliuds/ativan ordered He was just released from hospital on 11/10/2018 4:55 AM Patient sleeping on repeat examination.  He is in no acute distress.  Initial CIWA score at 7.  Symptoms appear to be mild.  His mental status is  appropriate Other than alcohol intoxication, labs reassuring. He is mildly tachycardic. Patient to be discharged home. He requests Librium taper for his alcohol withdrawal.  This is been sent to his pharmacy Outpatient resources been given the patient Final Clinical Impressions(s) / ED Diagnoses   Final diagnoses:  ETOH abuse    ED Discharge Orders         Ordered    chlordiazePOXIDE (LIBRIUM) 25 MG capsule     11/25/18 0453           Zadie Rhine, MD 11/25/18 [phone]

## 2018-11-25 NOTE — Discharge Instructions (Addendum)
Substance Abuse Treatment Programs ° °Intensive Outpatient Programs °[facility]     °[address]      °[place], [place]                   °[phone]      ° °[facility] °[address] °[place], [place] °336-379-7146 ° °[facility]     °(Inpatient and outpatient)     °[address]           °[phone]   ° °[facility] °[phone] (Suboxone and Methadone) ° °[address]      °[place], [place] [address]      °336-882-2125      ° °[address] °[place], [place] °852-3033 ° °[facility] (Outpatient/Inpatient, Chemical)    °(insurance only) [phone]      °       °[facility] (Groups & Residential) °[place], [place] °336-389-1413 ° °   °[facility]     °[address]     °[place], [place]      °[phone]      ° °[facility] (for caregivers and family) °[address] °[place], [place] °336-299-4655 ° ° ° ° ° °Residential Treatment Programs °[facility]      °[address], [place], [place] [address]  °([phone]      ° °[facility] °[address] [place], [place] [address] °[phone] ° °[facility]        °336-248-8914      ° °[facility] °1-800-659-3381 ° °[facility]             °[address]                                         °[place], [place]                                                °877-615-2722 or 336-784-9470                              ° °[facility] [place] °[address] °[place], [address] °1.877.941.8954 ° °[facility]    °[address]      °[place], [place]     °[phone]      ° °[facility] °[address] °[place], [place] °[phone] ° °[facility]   °[address]     °[place], [place] [address]     °[phone]      °Admissions: 8am-3pm M-F ° °[facility] °[address] °[place],  [place] °336-227-7417 ° °[facility]: Residential Program (90 Days)   °[place], [place]      °336-725-8389 or 800-758-6077    ° °[facility] °[place], [place] °(Walk in Hours over the weekend or by referral) ° °[facility] °[address], [place],  [address] °([phone]) [phone] ° °Crisis Mobile: [facility]:  1-877-626-1772 (for crisis response 24 hours a day) °[facility] Hotline:      1-800-256-2452 °Outpatient Psychiatry and Counseling ° °[facility]: Mobile Crisis   Management 24 hours:  1-877-626-1772 ° °[facility] sliding scale fee and walk in schedule: M-F 8am-12pm/1pm-3pm °[address]  °[place], [place] [address] °[phone] ° °[facility] °[address] °[place], [place] [address] °336-703-9650 ° °[facility] (Former[facility] known [facility])- new patient walk-in appointments available Monday - Friday 8am -3pm.          °[address] °[place], [place] [address] °336-676-6840 or crisis line- 336-676-6905 ° °[facility] Intensive Outpatient Therapy Program °[address] °[place], [place] [address] °336-832-9804 ° °[facility]                  °Crisis Services      °[phone]      °[address]     °[place], [place] [address]                ° °[facility]   °[facility] °[phone] °[address] °[place], [place] [address] ° ° °[facility]          °[address]  °[place], [place] [address]       °([phone] ° °[facility] °[address] °[place], [place] [address] °336-292-1510 ° °[facility]    °[address]    °[place], [place] [address]     °336-632-3505      ° °Parish McKinney, MD     °[address]     °[place] [address]     °[phone]     °  °[facility] °[address] °[place] [address] ° °[facility]     °[address]     °[place], [place]      °336-542-2076      ° °[facility] °Shamsher Ahluwalia, MD °[address] °[place], [place] [address] °[phone] ° °[facility]     °[address]     °[place], [place] [address]     °336-274-1237      ° °[facility] °[address] °[place], [place] [address] °336-854-4450 °Resources for Temporary Residential Assistance/Crisis Centers ° °DAY CENTERS °[facility] °M-F 8am-3pm   °[address] [place], [place] [address]   336-332-0824 °Services include: laundry, barbering, support groups, case management, phone  & computer access, showers, AA/NA mtgs, mental health/substance abuse nurse, job skills class, disability information, VA assistance, spiritual classes, etc.  ° °HOMELESS SHELTERS ° °[facility]     °[facility]   °[address], [place]     °336.271.5959       °       °[facility] (women and children)       °[address] °[place], [place] [address] °336-275-0820 °[email or web address] for application and process °Application Required ° °[facility]   °[address]    °[place] [address]     °[phone]       °             °[facility] °[address] °,  [address] °336.273.5572 °336-235-0363(schedule application appt.) °Application Required ° °[facility] (women only)    °[address]     °[place],  [address]     °336-884-1039      °  Intake starts 6pm daily °Need valid ID, SSC, & Police report °[facility] [place] °[address] °[place], [place] °336-881-5420 °Application Required ° °[facility] (men only)     °[address]      °[place], [place]     °[phone]      ° °[facility] °(Pregnant women only) °[address] °[place], [place] °336-275-0206 ° °[facility]      °[address]      °[place], [place] [address]     °[phone]      °       °[facility] °[address] °[place], [place] °336-723-1848 °90 day commitment/SA/Application process ° °[facility](men only)     °[address]     °[place], [place]     °[phone]       °Check-in at 7pm     °       °[facility] °[address] °[place], [place] [address] °336-248-6684 °Men/Women/Women and Children must be there by 7 pm ° °[facility] °[place], [place] °336-722-8721                ° °

## 2018-12-31 ENCOUNTER — Emergency Department (HOSPITAL_COMMUNITY)
Admission: EM | Admit: 2018-12-31 | Discharge: 2019-01-01 | Disposition: A | Discharge status: Home / Self Care | Payer: Self-pay | Attending: Emergency Medicine | Admitting: Emergency Medicine

## 2018-12-31 ENCOUNTER — Encounter (HOSPITAL_COMMUNITY): Payer: Self-pay | Admitting: *Deleted

## 2018-12-31 ENCOUNTER — Other Ambulatory Visit: Payer: Self-pay

## 2018-12-31 DIAGNOSIS — F10929 Alcohol use, unspecified with intoxication, unspecified: Secondary | ICD-10-CM | POA: Insufficient documentation

## 2018-12-31 DIAGNOSIS — F17210 Nicotine dependence, cigarettes, uncomplicated: Secondary | ICD-10-CM | POA: Insufficient documentation

## 2018-12-31 LAB — RAPID URINE DRUG SCREEN, HOSP PERFORMED
Amphetamines: NOT DETECTED
Barbiturates: NOT DETECTED
Benzodiazepines: NOT DETECTED
COCAINE: NOT DETECTED
Opiates: NOT DETECTED
Tetrahydrocannabinol: NOT DETECTED

## 2018-12-31 LAB — CBC WITH DIFFERENTIAL/PLATELET
Abs Immature Granulocytes: 0.03 10*3/uL (ref 0.00–0.07)
Basophils Absolute: 0 10*3/uL (ref 0.0–0.1)
Basophils Relative: 0 %
Eosinophils Absolute: 0.1 10*3/uL (ref 0.0–0.5)
Eosinophils Relative: 1 %
HEMATOCRIT: 46.4 % (ref 39.0–52.0)
Hemoglobin: 15 g/dL (ref 13.0–17.0)
Immature Granulocytes: 0 %
Lymphocytes Relative: 31 %
Lymphs Abs: 3.3 10*3/uL (ref 0.7–4.0)
MCH: 26.1 pg (ref 26.0–34.0)
MCHC: 32.3 g/dL (ref 30.0–36.0)
MCV: 80.8 fL (ref 80.0–100.0)
MONOS PCT: 3 %
Monocytes Absolute: 0.3 10*3/uL (ref 0.1–1.0)
Neutro Abs: 7.1 10*3/uL (ref 1.7–7.7)
Neutrophils Relative %: 65 %
Platelets: 108 10*3/uL — ABNORMAL LOW (ref 150–400)
RBC: 5.74 MIL/uL (ref 4.22–5.81)
RDW: 15.3 % (ref 11.5–15.5)
WBC: 10.9 10*3/uL — ABNORMAL HIGH (ref 4.0–10.5)
nRBC: 0 % (ref 0.0–0.2)

## 2018-12-31 LAB — COMPREHENSIVE METABOLIC PANEL
ALT: 57 U/L — AB (ref 0–44)
AST: 71 U/L — ABNORMAL HIGH (ref 15–41)
Albumin: 4.1 g/dL (ref 3.5–5.0)
Alkaline Phosphatase: 66 U/L (ref 38–126)
Anion gap: 14 (ref 5–15)
BUN: 6 mg/dL (ref 6–20)
CO2: 24 mmol/L (ref 22–32)
Calcium: 8.4 mg/dL — ABNORMAL LOW (ref 8.9–10.3)
Chloride: 104 mmol/L (ref 98–111)
Creatinine, Ser: 0.73 mg/dL (ref 0.61–1.24)
GFR calc Af Amer: >60 mL/min (ref >60)
GFR calc non Af Amer: >60 mL/min (ref >60)
GLUCOSE: 87 mg/dL (ref 70–99)
Potassium: 3.8 mmol/L (ref 3.5–5.1)
Sodium: 142 mmol/L (ref 135–145)
Total Bilirubin: 0.7 mg/dL (ref 0.3–1.2)
Total Protein: 7.7 g/dL (ref 6.5–8.1)

## 2018-12-31 LAB — LIPASE, BLOOD: Lipase: 41 U/L (ref 11–51)

## 2018-12-31 LAB — ETHANOL: Alcohol, Ethyl (B): 369 mg/dL (ref <10)

## 2018-12-31 MED ORDER — ONDANSETRON HCL 4 MG/2ML IJ SOLN
4.0000 mg | Freq: Once | INTRAMUSCULAR | Status: AC
  Administered 2018-12-31: 4 mg via INTRAVENOUS
  Filled 2018-12-31: qty 2

## 2018-12-31 MED ORDER — LORAZEPAM 2 MG/ML IJ SOLN
1.0000 mg | Freq: Once | INTRAMUSCULAR | Status: AC
  Administered 2018-12-31: 1 mg via INTRAVENOUS
  Filled 2018-12-31: qty 1

## 2018-12-31 MED ORDER — SODIUM CHLORIDE 0.9 % IV BOLUS
1000.0000 mL | Freq: Once | INTRAVENOUS | Status: AC
  Administered 2018-12-31: 1000 mL via INTRAVENOUS

## 2018-12-31 NOTE — ED Triage Notes (Signed)
The pt reports that he is h here for alcohol with drawal  He had alcohol today he is extremely intoxicated unsteady on his feet

## 2018-12-31 NOTE — ED Notes (Signed)
Pt will not keep bp cuff in place he has been obcessed with getting ativan

## 2018-12-31 NOTE — ED Provider Notes (Addendum)
[name] EMERG[facility] Provider Note   CSN: [id number] Arrival date & time: 12/31/18  1909    History   Chief Complaint Chief Complaint  Patient presents with  . Alcohol Intoxication    HPI [name] is a 48 y.o. male.     Level 5 caveat for intoxication.  Patient with known history of alcoholism presents with intoxication and "withdrawal".  He was unable to tell me when his last alcohol consumption was.  He feels tremulous anxious and anxious.  Past medical history includes alcoholism, anxiety, depression, bipolar, PTSD     Past Medical History:  Diagnosis Date  . Active smoker   . Alcohol abuse   . Anxiety   . Bipolar 2 disorder (HCC)   . Depression   . PTSD (post-traumatic stress disorder)     Patient Active Problem List   Diagnosis Date Noted  . Alcohol abuse with alcohol-induced mood disorder (HCC) 11/07/2018  . 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 EtOH dependence (HCC) 12/11/2017  . Alcohol withdrawal (HCC) 12/10/2017  . Seizure due to alcohol withdrawal (HCC) 08/30/2017  . Alcohol withdrawal seizure (HCC) 08/30/2017  . Tobacco abuse   . Delirium tremens (HCC) 03/02/2017  . GI bleed 10/06/2013  . Marijuana abuse 10/06/2013  . Suicidal ideation 10/05/2013  . Alcohol abuse 10/26/2011  . Depression 10/26/2011    Past Surgical History:  Procedure Laterality Date  . LYMPH NODE DISSECTION          Home Medications    Prior to Admission medications   Medication Sig Start Date End Date Taking? Authorizing Provider  chlordiazePOXIDE (LIBRIUM) 25 MG capsule 50mg  PO TID x 1D, then 25-50mg  PO BID X 1D, then 25-50mg  PO QD X 1D 11/25/18   Zadie Rhine, MD  gabapentin (NEURONTIN) 100 MG capsule Take 2 capsules (200 mg total) by mouth 2 (two) times daily. Patient not taking: Reported on 11/25/2018 11/07/18   Laveda Abbe, NP    Family History Family History   Problem Relation Age of Onset  . Alcohol abuse Father     Social History Social History   Tobacco Use  . Smoking status: Current Every Day Smoker    Packs/day: 2.00    Years: 20.00    Pack years: 40.00    Types: Cigarettes  . Smokeless tobacco: Never Used  Substance Use Topics  . Alcohol use: Yes    Comment: Binge drinker, last drink yesterday at 17:00.  . Drug use: Yes    Types: Marijuana    Comment: Daily. Last used: 2 days ago      Allergies   Paxil [paroxetine hcl]   Review of Systems Review of Systems  Unable to perform ROS: Psychiatric disorder     Physical Exam Updated Vital Signs BP 119/83   Pulse 100   Temp 98.4 F (36.9 C) (Oral)   Resp 16   Ht 5\' 10"  (1.778 m)   Wt 74.8 kg   SpO2 91%   BMI 23.68 kg/m   Physical Exam Vitals signs and nursing note reviewed.  Constitutional:      Appearance: He is well-developed.     Comments: Appears intoxicated  HENT:     Head: Normocephalic and atraumatic.  Eyes:     Conjunctiva/sclera: Conjunctivae normal.  Neck:     Musculoskeletal: Neck supple.  Cardiovascular:     Rate and Rhythm: Normal rate and regular rhythm.  Pulmonary:     Effort:  Pulmonary effort is normal.     Breath sounds: Normal breath sounds.  Abdominal:     General: Bowel sounds are normal.     Palpations: Abdomen is soft.  Musculoskeletal: Normal range of motion.  Skin:    General: Skin is warm and dry.  Neurological:     Comments: Restless, moving all extremities  Psychiatric:        Behavior: Behavior normal.      ED Treatments / Results  Labs (all labs ordered are listed, but only abnormal results are displayed) Labs Reviewed  CBC WITH DIFFERENTIAL/PLATELET - Abnormal; Notable for the following components:      Result Value   WBC 10.9 (*)    Platelets 108 (*)    All other components within normal limits  COMPREHENSIVE METABOLIC PANEL - Abnormal; Notable for the following components:   Calcium 8.4 (*)    AST 71 (*)     ALT 57 (*)    All other components within normal limits  ETHANOL - Abnormal; Notable for the following components:   Alcohol, Ethyl (B) 369 (*)    All other components within normal limits  LIPASE, BLOOD  RAPID URINE DRUG SCREEN, HOSP PERFORMED    EKG None  Radiology No results found.  Procedures Procedures (including critical care time)  Medications Ordered in ED Medications  sodium chloride 0.9 % bolus 1,000 mL (1,000 mLs Intravenous New Bag/Given 12/31/18 2241)  ondansetron (ZOFRAN) injection 4 mg (4 mg Intravenous Given 12/31/18 2241)  LORazepam (ATIVAN) injection 1 mg (1 mg Intravenous Given 12/31/18 2242)     Initial Impression / Assessment and Plan / ED Course  I have reviewed the triage vital signs and the nursing notes.  Pertinent labs & imaging results that were available during my care of the patient were reviewed by me and considered in my medical decision making (see chart for details).        Patient with a known history of alcoholism presents with intoxication and "withdrawal".  ETOH 369.  IV fluids, IV Ativan.  He will require more observation time.  Discussed with Dr Nicanor Alcon  Final Clinical Impressions(s) / ED Diagnoses   Final diagnoses:  Alcoholic intoxication with complication [facility])    ED Discharge Orders    None       Donnetta Hutching, MD 01/01/19 0001    Donnetta Hutching, MD 01/01/19 0001

## 2018-12-31 NOTE — ED Notes (Signed)
Pt not staying in bed

## 2018-12-31 NOTE — ED Notes (Signed)
Pt still intoxicated

## 2019-01-01 MED ORDER — SODIUM CHLORIDE 0.9 % IV BOLUS
1000.0000 mL | Freq: Once | INTRAVENOUS | Status: AC
  Administered 2019-01-01: 1000 mL via INTRAVENOUS

## 2019-01-01 MED ORDER — LORAZEPAM 2 MG/ML IJ SOLN
0.5000 mg | Freq: Once | INTRAMUSCULAR | Status: AC
  Administered 2019-01-01: 0.5 mg via INTRAVENOUS
  Filled 2019-01-01: qty 1

## 2019-01-01 NOTE — ED Notes (Signed)
Pt ambulatory with steady gait. Pt able to tolerate food well. Pt been drinking and eating at bedside. Pt anxious and requesting fluids and ativan. CIWA completed. No acute distress noted.

## 2019-01-01 NOTE — ED Notes (Signed)
Late entry: Wasted 1.5mg  of ativan with Banker. Pt discharged and unable to waste in pyxis.

## 2019-01-01 NOTE — ED Notes (Signed)
Ambulatory with steady gait. Calling family to pick him up.

## 2019-02-18 ENCOUNTER — Emergency Department (HOSPITAL_COMMUNITY)
Admission: EM | Admit: 2019-02-18 | Discharge: 2019-02-18 | Disposition: A | Discharge status: Home / Self Care | Payer: Self-pay | Attending: Emergency Medicine | Admitting: Emergency Medicine

## 2019-02-18 ENCOUNTER — Other Ambulatory Visit: Payer: Self-pay

## 2019-02-18 ENCOUNTER — Encounter (HOSPITAL_COMMUNITY): Payer: Self-pay | Admitting: Emergency Medicine

## 2019-02-18 DIAGNOSIS — R569 Unspecified convulsions: Secondary | ICD-10-CM | POA: Insufficient documentation

## 2019-02-18 DIAGNOSIS — F17210 Nicotine dependence, cigarettes, uncomplicated: Secondary | ICD-10-CM | POA: Insufficient documentation

## 2019-02-18 DIAGNOSIS — F1010 Alcohol abuse, uncomplicated: Secondary | ICD-10-CM

## 2019-02-18 DIAGNOSIS — F10920 Alcohol use, unspecified with intoxication, uncomplicated: Secondary | ICD-10-CM | POA: Insufficient documentation

## 2019-02-18 LAB — RAPID URINE DRUG SCREEN, HOSP PERFORMED
Amphetamines: NOT DETECTED
Barbiturates: NOT DETECTED
Benzodiazepines: NOT DETECTED
Cocaine: NOT DETECTED
Opiates: NOT DETECTED
Tetrahydrocannabinol: NOT DETECTED

## 2019-02-18 LAB — CBC WITH DIFFERENTIAL/PLATELET
Abs Immature Granulocytes: 0.01 10*3/uL (ref 0.00–0.07)
Basophils Absolute: 0 10*3/uL (ref 0.0–0.1)
Basophils Relative: 1 %
Eosinophils Absolute: 0.1 10*3/uL (ref 0.0–0.5)
Eosinophils Relative: 2 %
HCT: 44.6 % (ref 39.0–52.0)
Hemoglobin: 14.7 g/dL (ref 13.0–17.0)
Immature Granulocytes: 0 %
Lymphocytes Relative: 59 %
Lymphs Abs: 3.4 10*3/uL (ref 0.7–4.0)
MCH: 27.6 pg (ref 26.0–34.0)
MCHC: 33 g/dL (ref 30.0–36.0)
MCV: 83.7 fL (ref 80.0–100.0)
Monocytes Absolute: 0.3 10*3/uL (ref 0.1–1.0)
Monocytes Relative: 6 %
Neutro Abs: 1.8 10*3/uL (ref 1.7–7.7)
Neutrophils Relative %: 32 %
Platelets: 214 10*3/uL (ref 150–400)
RBC: 5.33 MIL/uL (ref 4.22–5.81)
RDW: 14.6 % (ref 11.5–15.5)
WBC: 5.7 10*3/uL (ref 4.0–10.5)
nRBC: 0 % (ref 0.0–0.2)

## 2019-02-18 LAB — BASIC METABOLIC PANEL
Anion gap: 11 (ref 5–15)
BUN: 10 mg/dL (ref 6–20)
CO2: 30 mmol/L (ref 22–32)
Calcium: 8.5 mg/dL — ABNORMAL LOW (ref 8.9–10.3)
Chloride: 105 mmol/L (ref 98–111)
Creatinine, Ser: 0.67 mg/dL (ref 0.61–1.24)
GFR calc Af Amer: >60 mL/min (ref >60)
GFR calc non Af Amer: >60 mL/min (ref >60)
Glucose, Bld: 125 mg/dL — ABNORMAL HIGH (ref 70–99)
Potassium: 3.6 mmol/L (ref 3.5–5.1)
Sodium: 146 mmol/L — ABNORMAL HIGH (ref 135–145)

## 2019-02-18 LAB — CBG MONITORING, ED: Glucose-Capillary: 102 mg/dL — ABNORMAL HIGH (ref 70–99)

## 2019-02-18 LAB — ETHANOL: Alcohol, Ethyl (B): 395 mg/dL (ref <10)

## 2019-02-18 MED ORDER — VITAMIN B-1 100 MG PO TABS
100.0000 mg | ORAL_TABLET | Freq: Once | ORAL | Status: AC
  Administered 2019-02-18: 100 mg via ORAL
  Filled 2019-02-18: qty 1

## 2019-02-18 MED ORDER — LORAZEPAM 1 MG PO TABS
1.0000 mg | ORAL_TABLET | Freq: Once | ORAL | Status: AC
  Administered 2019-02-18: 1 mg via ORAL
  Filled 2019-02-18: qty 1

## 2019-02-18 NOTE — Progress Notes (Signed)
Received [name] at the nurses station being very demanding and requesting to go home. Shortly thereafter he attempted to elope. He received his discharge paperwork, his personal clothes were returned and he was secorted to the front door.

## 2019-02-18 NOTE — ED Provider Notes (Signed)
[facility]-EMERGENCY DEPT Provider Note   CSN: [id number] Arrival date & time: 02/18/19  1418    History   Chief Complaint Chief Complaint  Patient presents with  . Medical Clearance    HPI Ray ChurchMichael C Serfass is a 48 y.o. male.     48 yo M with a chief complaint of alcohol intoxication.  Patient states he had about 12 beers today.  He is intoxicated and has trouble carrying on a conversation.  States over and over again that he was in the [employer] for 20 years and has had trouble with alcoholism.  Does not state overtly that he wants help.  Denies chest pain abdominal pain headache neck pain.  Denies trauma.  Level 5 caveat alcohol intoxication.  The history is provided by the patient.  Illness  Severity:  Moderate Onset quality:  Sudden Duration:  2 days Timing:  Constant Progression:  Unchanged Chronicity:  New Associated symptoms: no abdominal pain, no chest pain, no congestion, no diarrhea, no fever, no headaches, no myalgias, no rash, no shortness of breath and no vomiting     Past Medical History:  Diagnosis Date  . Active smoker   . Alcohol abuse   . Anxiety   . Bipolar 2 disorder (HCC)   . Depression   . PTSD (post-traumatic stress disorder)     Patient Active Problem List   Diagnosis Date Noted  . Alcohol abuse with alcohol-induced mood disorder (HCC) 11/07/2018  . 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 EtOH dependence (HCC) 12/11/2017  . Alcohol withdrawal (HCC) 12/10/2017  . Seizure due to alcohol withdrawal (HCC) 08/30/2017  . Alcohol withdrawal seizure (HCC) 08/30/2017  . Tobacco abuse   . Delirium tremens (HCC) 03/02/2017  . GI bleed 10/06/2013  . Marijuana abuse 10/06/2013  . Suicidal ideation 10/05/2013  . Alcohol abuse 10/26/2011  . Depression 10/26/2011    Past Surgical History:  Procedure Laterality Date  . LYMPH NODE DISSECTION          Home Medications    Prior  to Admission medications   Medication Sig Start Date End Date Taking? Authorizing Provider  chlordiazePOXIDE (LIBRIUM) 25 MG capsule 50mg  PO TID x 1D, then 25-50mg  PO BID X 1D, then 25-50mg  PO QD X 1D 11/25/18   Zadie RhineWickline, Donald, MD  gabapentin (NEURONTIN) 100 MG capsule Take 2 capsules (200 mg total) by mouth 2 (two) times daily. Patient not taking: Reported on 11/25/2018 11/07/18   Laveda AbbeParks, Laurie Britton, NP    Family History Family History  Problem Relation Age of Onset  . Alcohol abuse Father     Social History Social History   Tobacco Use  . Smoking status: Current Every Day Smoker    Packs/day: 2.00    Years: 20.00    Pack years: 40.00    Types: Cigarettes  . Smokeless tobacco: Never Used  Substance Use Topics  . Alcohol use: Yes    Comment: Binge drinker, last drink yesterday at 17:00.  . Drug use: Yes    Types: Marijuana    Comment: Daily. Last used: 2 days ago      Allergies   Paxil [paroxetine hcl]   Review of Systems Review of Systems  Unable to perform ROS: Mental status change  Constitutional: Negative for chills and fever.  HENT: Negative for congestion and facial swelling.   Eyes: Negative for discharge and visual disturbance.  Respiratory: Negative for shortness of breath.   Cardiovascular: Negative for  chest pain and palpitations.  Gastrointestinal: Negative for abdominal pain, diarrhea and vomiting.  Musculoskeletal: Negative for arthralgias and myalgias.  Skin: Negative for color change and rash.  Neurological: Negative for tremors, syncope and headaches.  Psychiatric/Behavioral: Negative for confusion and dysphoric mood.     Physical Exam Updated Vital Signs BP 135/82 (BP Location: Left Arm)   Pulse (!) 101   Temp 98.6 F (37 C) (Oral)   Ht 5\' 10"  (1.778 m)   Wt 81.6 kg   SpO2 95%   BMI 25.83 kg/m   Physical Exam Vitals signs and nursing note reviewed.  Constitutional:      Appearance: He is well-developed.     Comments: Smells of  alcohol on breath clinically intoxicated no signs of trauma  HENT:     Head: Normocephalic and atraumatic.  Eyes:     Pupils: Pupils are equal, round, and reactive to light.  Neck:     Musculoskeletal: Normal range of motion and neck supple.     Vascular: No JVD.  Cardiovascular:     Rate and Rhythm: Normal rate and regular rhythm.     Heart sounds: No murmur. No friction rub. No gallop.   Pulmonary:     Effort: No respiratory distress.     Breath sounds: No wheezing.  Abdominal:     General: There is no distension.     Tenderness: There is no guarding or rebound.  Musculoskeletal: Normal range of motion.  Skin:    Coloration: Skin is not pale.     Findings: No rash.  Neurological:     Mental Status: He is alert and oriented to person, place, and time.     Comments: Tremor  Psychiatric:        Behavior: Behavior normal.      ED Treatments / Results  Labs (all labs ordered are listed, but only abnormal results are displayed) Labs Reviewed  CBG MONITORING, ED - Abnormal; Notable for the following components:      Result Value   Glucose-Capillary 102 (*)    All other components within normal limits  CBC WITH DIFFERENTIAL/PLATELET  BASIC METABOLIC PANEL  RAPID URINE DRUG SCREEN, HOSP PERFORMED  ETHANOL    EKG None  Radiology No results found.  Procedures Procedures (including critical care time)  Medications Ordered in ED Medications  thiamine (VITAMIN B-1) tablet 100 mg (has no administration in time range)     Initial Impression / Assessment and Plan / ED Course  I have reviewed the triage vital signs and the nursing notes.  Pertinent labs & imaging results that were available during my care of the patient were reviewed by me and considered in my medical decision making (see chart for details).        48 yo M with a chief complaint of alcohol intoxication.  Visitation is consistent with alcohol intoxication and patient endorses that he did drink  earlier today.  Patient has a mild tremor will obtain basic labs.  Patient care signed out to Dr. Rodena Medin please see his note for further details care in ED.  The patients results and plan were reviewed and discussed.   Any x-rays performed were independently reviewed by myself.   Differential diagnosis were considered with the presenting HPI.  Medications  thiamine (VITAMIN B-1) tablet 100 mg (has no administration in time range)    Vitals:   02/18/19 1501 02/18/19 1504  BP: 135/82   Pulse: (!) 101   Temp: 98.6 F (37 C)  TempSrc: Oral   SpO2: 95%   Weight:  81.6 kg  Height:   (1.778 m)    Final diagnoses:  Alcoholic intoxication without complication [facility])      Final Clinical Impressions(s) / ED Diagnoses   Final diagnoses:  Alcoholic intoxication without complication [facility])    ED Discharge Orders    None       Melene Plan, DO 02/18/19 1605

## 2019-02-18 NOTE — ED Triage Notes (Signed)
Appears to be under the influence of alcohol, admits to drinking 10 to 12 beers today and smells like alcohol. States that he wants to detox. Denies SI/HI/AVH. Tearfully reports "I served my country for 10 years."   He is cooperative.  This Clinical research associate helped him to change clothes into paper scrubs and he was wanded by Dorris Carnes with security.   BELONGINGS INCUDE  TENNIS SHOES; SOCKS; PANTS; CELL PHONE IN PANTS POCKET; PT REPORTS NO MONEY AND NO MEDICATIONS.  PLACED IN LOCKER 30.

## 2019-02-18 NOTE — ED Provider Notes (Signed)
Patient seen after signout from prior ED provider.  Patient presented to the ED with acute intoxication.  Screening work-up did not reveal other acute pathology.  Following a period of observation he feels improved.  He desires discharge home.  He is advised to drink alcohol in moderation.  He is not suicidal, homicidal, or in need of IVC.  Importance of close follow-up is stressed.  Strict return precautions given and understood.   Wynetta Fines, MD 02/18/19 [phone]

## 2019-02-18 NOTE — ED Notes (Signed)
CBG completed by this writer = 102.  It was done twice because the first time it did not cross over to the chart. First stick around 1515 it ws 122.

## 2019-02-18 NOTE — Discharge Instructions (Addendum)
Please return for any problem.  Follow-up with your regular care provider as instructed. °

## 2019-02-19 IMAGING — CT CT HEAD W/O CM
3 series · 16 of 47 positions shown, 19 images · non-contrast
Comparison: CT head dated August 30, 2017.

CLINICAL DATA: Altered mental status and seizures from alcohol
withdrawal. Recent fall.

EXAM:
CT HEAD WITHOUT CONTRAST
TECHNIQUE: Contiguous axial images were obtained from the base of the skull
through the vertex without intravenous contrast.

[Series 2: head wo · axial · 0.47mm/px · z∈[-177,-47]mm · 10 of 32 slices shown, 13 images]
[im 3/32  brain]
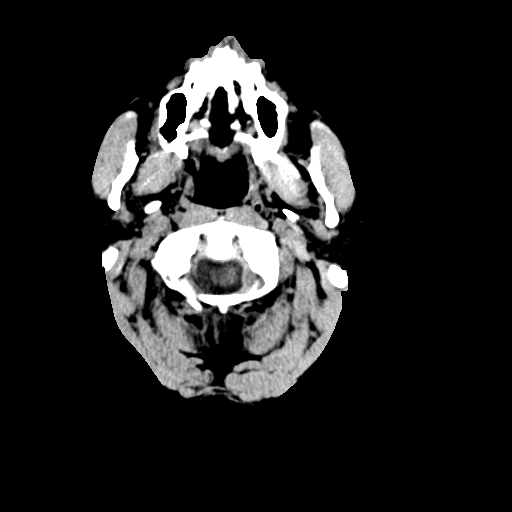
[im 3/32  bone]
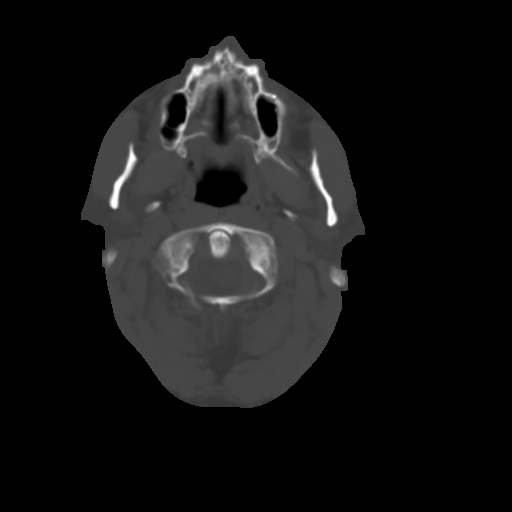
[im 6/32  brain]
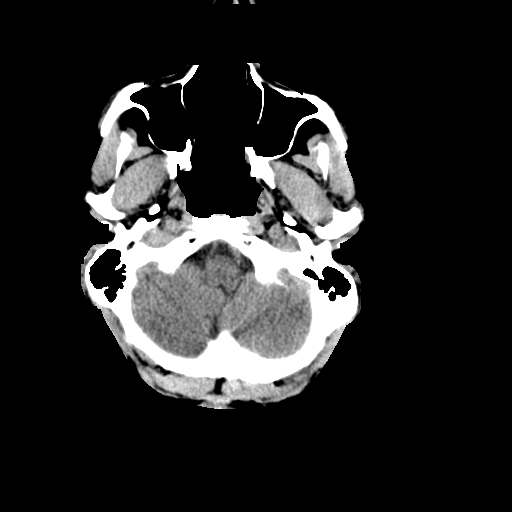
[im 9/32  brain]
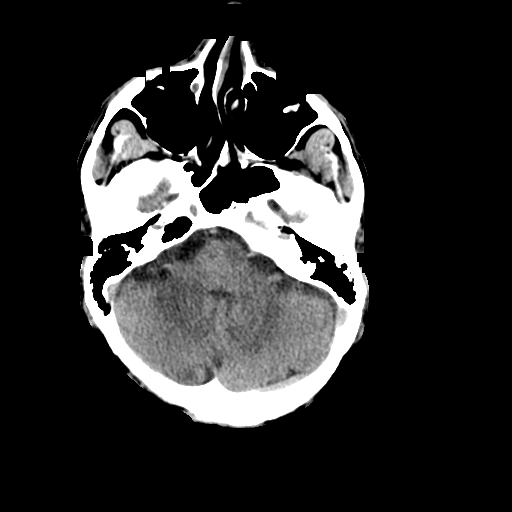
[im 11/32  brain]
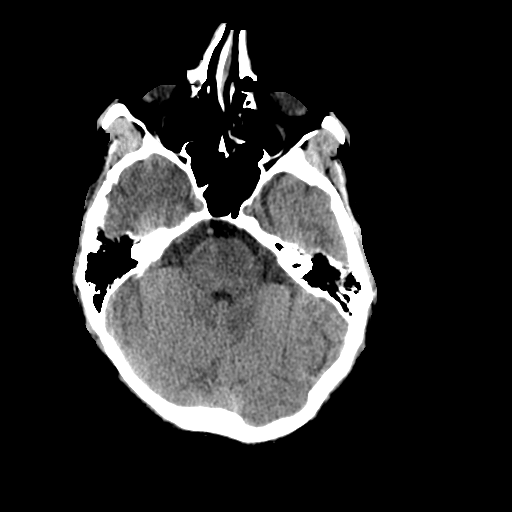
[im 14/32  brain]
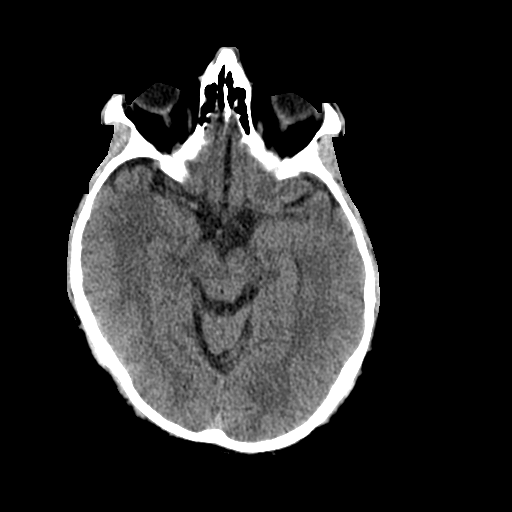
[im 14/32  bone]
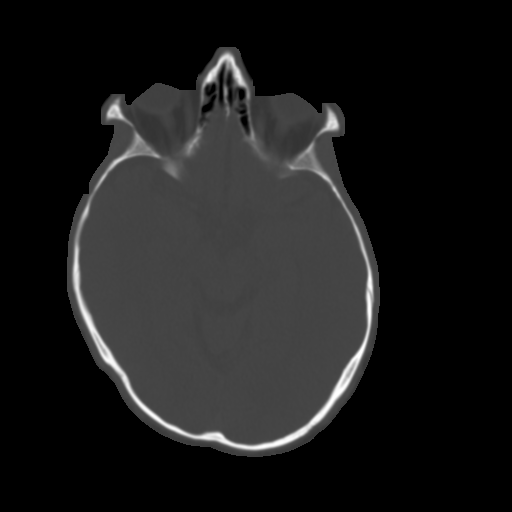
[im 18/32  brain]
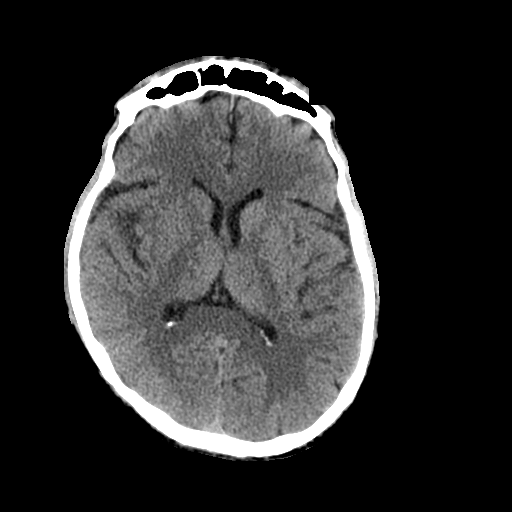
[im 21/32  brain]
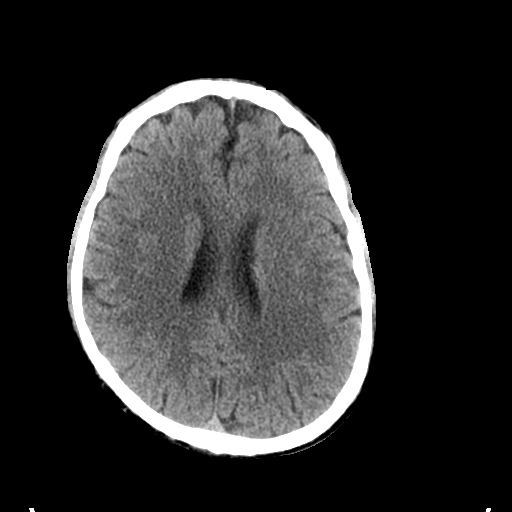
[im 24/32  brain]
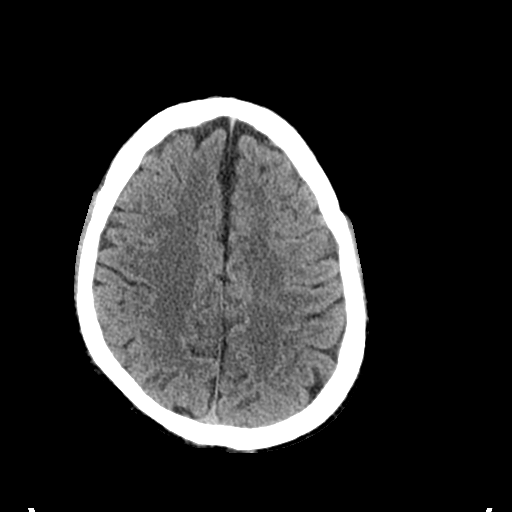
[im 26/32  brain]
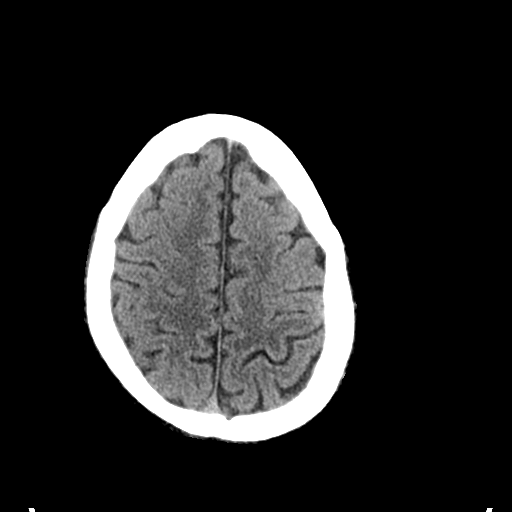
[im 26/32  bone]
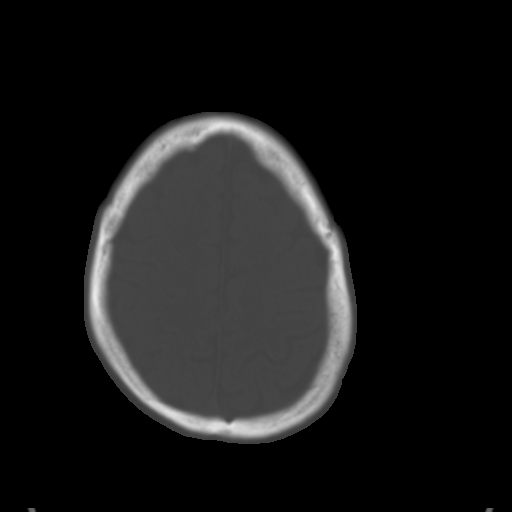
[im 29/32  brain]
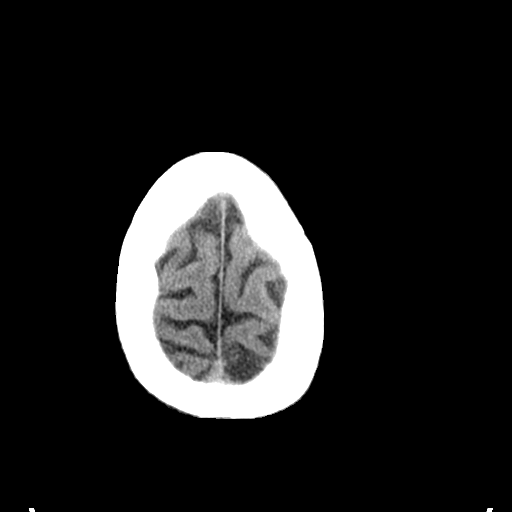

[Series 4: coronal soft tissue · coronal · 0.30mm/px · 3 of 63 slices shown]
[im 21/63  brain]
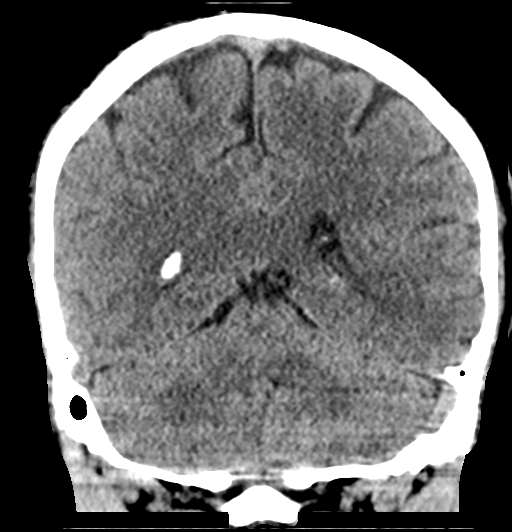
[im 28/63  brain]
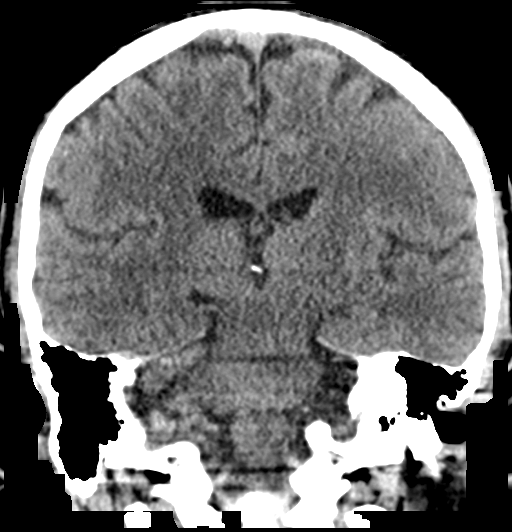
[im 35/63  brain]
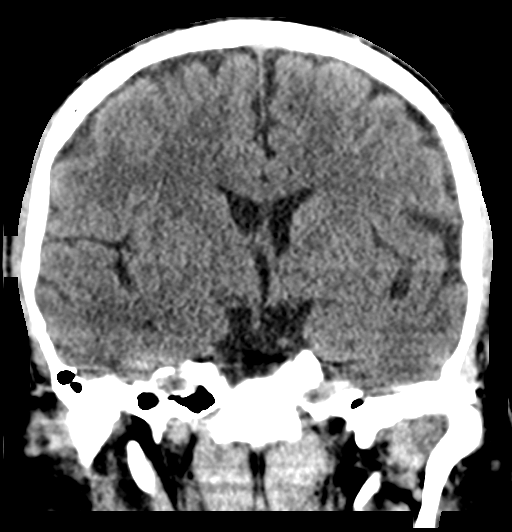

[Series 5: sagittal soft tissue · sagittal · 0.31mm/px · 3 of 51 slices shown]
[im 17/51  brain]
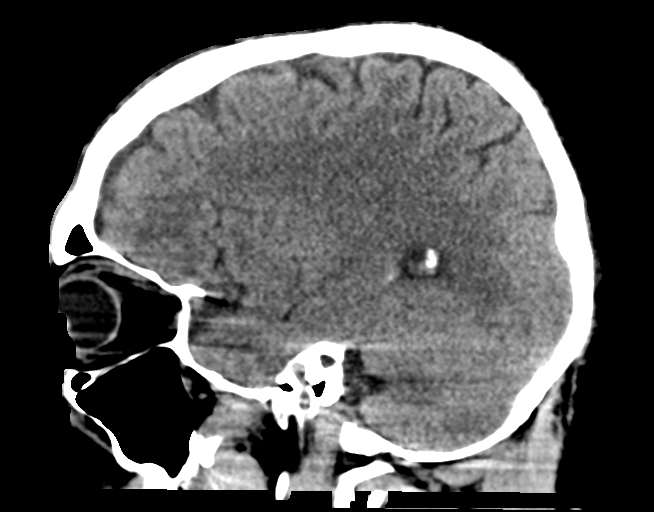
[im 26/51  brain]
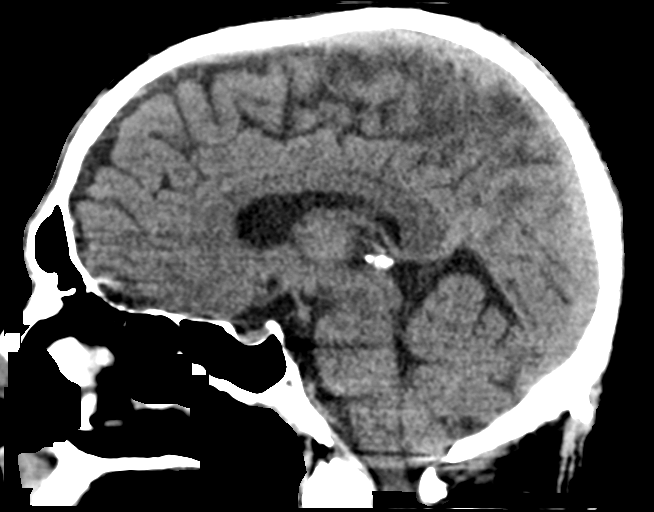
[im 34/51  brain]
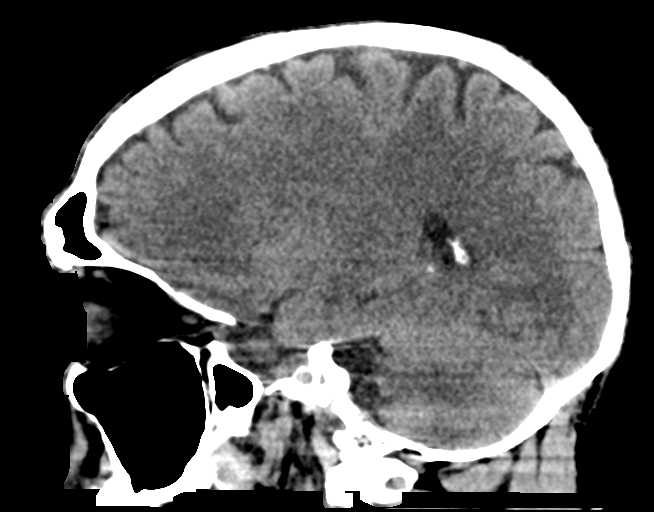

[16 of 47 positions shown; findings below may reference images not displayed]

FINDINGS: Brain: No evidence of acute infarction, hemorrhage, hydrocephalus,
extra-axial collection or mass lesion/mass effect.

Vascular: No hyperdense vessel or unexpected calcification.

Skull: Normal. Negative for fracture or focal lesion.

Sinuses/Orbits: No acute finding.

Other: None.
IMPRESSION: 1. Normal for age noncontrast head CT.

## 2019-02-21 LAB — CBG MONITORING, ED: Glucose-Capillary: 122 mg/dL — ABNORMAL HIGH (ref 70–99)

## 2019-02-24 ENCOUNTER — Emergency Department (HOSPITAL_COMMUNITY)
Admission: EM | Admit: 2019-02-24 | Discharge: 2019-02-24 | Disposition: A | Discharge status: Home / Self Care | Payer: Self-pay | Attending: Emergency Medicine | Admitting: Emergency Medicine

## 2019-02-24 ENCOUNTER — Encounter (HOSPITAL_COMMUNITY): Payer: Self-pay

## 2019-02-24 ENCOUNTER — Other Ambulatory Visit: Payer: Self-pay

## 2019-02-24 DIAGNOSIS — F1010 Alcohol abuse, uncomplicated: Secondary | ICD-10-CM | POA: Insufficient documentation

## 2019-02-24 DIAGNOSIS — Z79899 Other long term (current) drug therapy: Secondary | ICD-10-CM | POA: Insufficient documentation

## 2019-02-24 DIAGNOSIS — F17210 Nicotine dependence, cigarettes, uncomplicated: Secondary | ICD-10-CM | POA: Insufficient documentation

## 2019-02-24 DIAGNOSIS — R569 Unspecified convulsions: Secondary | ICD-10-CM | POA: Insufficient documentation

## 2019-02-24 LAB — CBC
HCT: 41.7 % (ref 39.0–52.0)
Hemoglobin: 14 g/dL (ref 13.0–17.0)
MCH: 27.4 pg (ref 26.0–34.0)
MCHC: 33.6 g/dL (ref 30.0–36.0)
MCV: 81.6 fL (ref 80.0–100.0)
Platelets: 120 10*3/uL — ABNORMAL LOW (ref 150–400)
RBC: 5.11 MIL/uL (ref 4.22–5.81)
RDW: 14.6 % (ref 11.5–15.5)
WBC: 4 10*3/uL (ref 4.0–10.5)
nRBC: 0.5 % — ABNORMAL HIGH (ref 0.0–0.2)

## 2019-02-24 LAB — COMPREHENSIVE METABOLIC PANEL
ALT: 77 U/L — ABNORMAL HIGH (ref 0–44)
AST: 124 U/L — ABNORMAL HIGH (ref 15–41)
Albumin: 4.4 g/dL (ref 3.5–5.0)
Alkaline Phosphatase: 58 U/L (ref 38–126)
Anion gap: 14 (ref 5–15)
BUN: 6 mg/dL (ref 6–20)
CO2: 25 mmol/L (ref 22–32)
Calcium: 8.3 mg/dL — ABNORMAL LOW (ref 8.9–10.3)
Chloride: 103 mmol/L (ref 98–111)
Creatinine, Ser: 0.58 mg/dL — ABNORMAL LOW (ref 0.61–1.24)
GFR calc Af Amer: >60 mL/min (ref >60)
GFR calc non Af Amer: >60 mL/min (ref >60)
Glucose, Bld: 103 mg/dL — ABNORMAL HIGH (ref 70–99)
Potassium: 3.5 mmol/L (ref 3.5–5.1)
Sodium: 142 mmol/L (ref 135–145)
Total Bilirubin: 0.7 mg/dL (ref 0.3–1.2)
Total Protein: 7.7 g/dL (ref 6.5–8.1)

## 2019-02-24 LAB — RAPID URINE DRUG SCREEN, HOSP PERFORMED
Amphetamines: NOT DETECTED
Barbiturates: NOT DETECTED
Benzodiazepines: NOT DETECTED
Cocaine: NOT DETECTED
Opiates: NOT DETECTED
Tetrahydrocannabinol: NOT DETECTED

## 2019-02-24 LAB — ETHANOL: Alcohol, Ethyl (B): 456 mg/dL (ref <10)

## 2019-02-24 MED ORDER — CHLORDIAZEPOXIDE HCL 25 MG PO CAPS
25.0000 mg | ORAL_CAPSULE | Freq: Once | ORAL | Status: AC
  Administered 2019-02-24: 25 mg via ORAL
  Filled 2019-02-24: qty 1

## 2019-02-24 MED ORDER — CHLORDIAZEPOXIDE HCL 25 MG PO CAPS: ORAL_CAPSULE | ORAL | 0 refills | Status: DC

## 2019-02-24 MED ORDER — SODIUM CHLORIDE 0.9 % IV BOLUS
1000.0000 mL | Freq: Once | INTRAVENOUS | Status: AC
  Administered 2019-02-24: 1000 mL via INTRAVENOUS

## 2019-02-24 MED ORDER — SODIUM CHLORIDE 0.9 % IV BOLUS
1000.0000 mL | Freq: Once | INTRAVENOUS | Status: AC
  Administered 2019-02-24: 17:00:00 1000 mL via INTRAVENOUS

## 2019-02-24 NOTE — ED Provider Notes (Signed)
[facility]-EMERGENCY DEPT Provider Note   CSN: [id number] Arrival date & time: 02/24/19  1343    History   Chief Complaint Chief Complaint  Patient presents with  . Alcohol Problem    HPI [name] is a 48 y.o. male presenting by POV with his son for alcohol intoxication.   When I asked pt why he came to the Emergency Department, he states "I drank too much today".   He otherwise has no complaints.    He is currently slurring his speech, but he is sitting up in bed and is not obtunded.   He is repetitive of his speech, stating "I've done 20 years in the [employer]".   This is consistent with his previous alcohol intoxication presentations.     The history is provided by the patient. The history is limited by the condition of the patient.  Alcohol Problem  This is a chronic problem. The problem occurs constantly. The problem has not changed since onset.Pertinent negatives include no chest pain. He has tried nothing for the symptoms.    Past Medical History:  Diagnosis Date  . Active smoker   . Alcohol abuse   . Anxiety   . Bipolar 2 disorder (HCC)   . Depression   . PTSD (post-traumatic stress disorder)     Patient Active Problem List   Diagnosis Date Noted  . Alcohol abuse with alcohol-induced mood disorder (HCC) 11/07/2018  . 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 EtOH dependence (HCC) 12/11/2017  . Alcohol withdrawal (HCC) 12/10/2017  . Seizure due to alcohol withdrawal (HCC) 08/30/2017  . Alcohol withdrawal seizure (HCC) 08/30/2017  . Tobacco abuse   . Delirium tremens (HCC) 03/02/2017  . GI bleed 10/06/2013  . Marijuana abuse 10/06/2013  . Suicidal ideation 10/05/2013  . Alcohol abuse 10/26/2011  . Depression 10/26/2011    Past Surgical History:  Procedure Laterality Date  . LYMPH NODE DISSECTION          Home Medications    Prior to Admission medications   Medication Sig Start  Date End Date Taking? Authorizing Provider  chlordiazePOXIDE (LIBRIUM) 25 MG capsule 50mg  PO TID x 1D, then 25-50mg  PO BID X 1D, then 25-50mg  PO QD X 1D 02/24/19   Nene Aranas K, PA-C  gabapentin (NEURONTIN) 100 MG capsule Take 2 capsules (200 mg total) by mouth 2 (two) times daily. Patient not taking: Reported on 11/25/2018 11/07/18   Laveda AbbeParks, Laurie Britton, NP    Family History Family History  Problem Relation Age of Onset  . Alcohol abuse Father     Social History Social History   Tobacco Use  . Smoking status: Current Every Day Smoker    Packs/day: 2.00    Years: 20.00    Pack years: 40.00    Types: Cigarettes  . Smokeless tobacco: Never Used  Substance Use Topics  . Alcohol use: Yes    Comment: Binge drinker, last drink yesterday at 17:00.  . Drug use: Yes    Types: Marijuana    Comment: Daily. Last used: 2 days ago      Allergies   Paxil [paroxetine hcl]   Review of Systems Review of Systems  Reason unable to perform ROS: Pt is intoxicated.  Cardiovascular: Negative for chest pain.     Physical Exam Updated Vital Signs BP (!) 142/96 (BP Location: Right Arm)   Pulse 99   Temp 98.2 F (36.8 C) (Oral)   Resp 20  Ht 5\' 10"  (1.778 m)   Wt 81.5 kg   SpO2 97%   BMI 25.78 kg/m   Physical Exam Vitals signs and nursing note reviewed.  Constitutional:      General: He is not in acute distress.    Appearance: Normal appearance. He is not toxic-appearing or diaphoretic.     Comments: Appears acutely intoxicated    HENT:     Head: Normocephalic and atraumatic.  Eyes:     General:        Right eye: No discharge.        Left eye: No discharge.  Cardiovascular:     Rate and Rhythm: Regular rhythm. Tachycardia present.     Pulses: Normal pulses.     Heart sounds: Normal heart sounds.  Pulmonary:     Effort: Pulmonary effort is normal. No respiratory distress.     Breath sounds: Normal breath sounds. No wheezing.  Abdominal:     General: There is no  distension.     Tenderness: There is no abdominal tenderness. There is no guarding.  Skin:    General: Skin is warm.  Neurological:     Mental Status: He is alert.     Coordination: Coordination abnormal.  Psychiatric:     Comments: Pt continues to be tearful, continually stating "I did 20 years in the [employer]", then cries.   He denies SI/HI.      ED Treatments / Results  Labs (all labs ordered are listed, but only abnormal results are displayed) Labs Reviewed  COMPREHENSIVE METABOLIC PANEL - Abnormal; Notable for the following components:      Result Value   Glucose, Bld 103 (*)    Creatinine, Ser 0.58 (*)    Calcium 8.3 (*)    AST 124 (*)    ALT 77 (*)    All other components within normal limits  ETHANOL - Abnormal; Notable for the following components:   Alcohol, Ethyl (B) 456 (*)    All other components within normal limits  CBC - Abnormal; Notable for the following components:   Platelets 120 (*)    nRBC 0.5 (*)    All other components within normal limits  RAPID URINE DRUG SCREEN, HOSP PERFORMED    EKG None  Radiology No results found.  Procedures Procedures (including critical care time)  Medications Ordered in ED Medications  sodium chloride 0.9 % bolus 1,000 mL (1,000 mLs Intravenous New Bag/Given 02/24/19 1532)  sodium chloride 0.9 % bolus 1,000 mL (1,000 mLs Intravenous New Bag/Given 02/24/19 1636)  chlordiazePOXIDE (LIBRIUM) capsule 25 mg (25 mg Oral Given 02/24/19 1735)     Initial Impression / Assessment and Plan / ED Course  I have reviewed the triage vital signs and the nursing notes.  Pertinent labs & imaging results that were available during my care of the patient were reviewed by me and considered in my medical decision making (see chart for details).  Pts tachycardia and slurred speech have improved.   He has eaten a sandwich tray and has been PO tolerant to liquids w/out difficulty.   He states he does not want to go home, but he cannot tell  me a reason why.  He can tell me his specific location, year/date, president and his full name w/out difficulty.  I will attempt to ambulate pt at this time.  Pt ambulatory w/out assistance.   He is stable for discharge.  Final Clinical Impressions(s) / ED Diagnoses   Final diagnoses:  Alcohol abuse  ED Discharge Orders         Ordered    chlordiazePOXIDE (LIBRIUM) 25 MG capsule     02/24/19 1759           Falesha Schommer K, PA-C 02/24/19 1801    Virgina Norfolk, DO 02/24/19 1906

## 2019-02-24 NOTE — ED Triage Notes (Signed)
Pt brought in by son. Pt states that he has been drinking about an 18 pack/day . Pt denies SI/HI. Pt states last drink about 5 hours ago. Pt appears intoxicated.

## 2019-02-24 NOTE — ED Notes (Signed)
Pt was given a ham sandwich along with 2 milks. Nurse aware.

## 2019-02-24 NOTE — Discharge Instructions (Addendum)
Please stop abusing alcohol to excess.

## 2019-03-01 ENCOUNTER — Other Ambulatory Visit: Payer: Self-pay

## 2019-03-01 ENCOUNTER — Emergency Department (HOSPITAL_COMMUNITY)
Admission: EM | Admit: 2019-03-01 | Discharge: 2019-03-01 | Disposition: A | Discharge status: Home / Self Care | Payer: Self-pay | Attending: Emergency Medicine | Admitting: Emergency Medicine

## 2019-03-01 ENCOUNTER — Encounter (HOSPITAL_COMMUNITY): Payer: Self-pay

## 2019-03-01 DIAGNOSIS — Z79899 Other long term (current) drug therapy: Secondary | ICD-10-CM | POA: Insufficient documentation

## 2019-03-01 DIAGNOSIS — F17210 Nicotine dependence, cigarettes, uncomplicated: Secondary | ICD-10-CM | POA: Insufficient documentation

## 2019-03-01 DIAGNOSIS — F10920 Alcohol use, unspecified with intoxication, uncomplicated: Secondary | ICD-10-CM | POA: Insufficient documentation

## 2019-03-01 MED ORDER — CHLORDIAZEPOXIDE HCL 25 MG PO CAPS: ORAL_CAPSULE | ORAL | 0 refills | Status: DC

## 2019-03-01 MED ORDER — CHLORDIAZEPOXIDE HCL 25 MG PO CAPS
50.0000 mg | ORAL_CAPSULE | Freq: Once | ORAL | Status: AC
  Administered 2019-03-01: 50 mg via ORAL
  Filled 2019-03-01: qty 2

## 2019-03-01 NOTE — ED Notes (Signed)
Bed: WLPT4 Expected date:  Expected time:  Means of arrival:  Comments: 

## 2019-03-01 NOTE — Discharge Instructions (Addendum)
Follow up at [facility] tomorrow.  You can quit if you make it a priority in your life.

## 2019-03-01 NOTE — ED Triage Notes (Signed)
Patient was dropped off by significant other. Patient is requesting detox from alcohol. Patient states he has had 3 12 ounce cans of beer today. Patient is intoxicated. Patient states he last smoked marijuana a week ago.

## 2019-03-01 NOTE — ED Provider Notes (Signed)
[facility]-EMERGENCY DEPT Provider Note   CSN: [id number] Arrival date & time: 03/01/19  1547    History   Chief Complaint Chief Complaint  Patient presents with  . detox    alcohol    HPI [name] is a 48 y.o. male.     48 yo M with a chief complaint of requesting detox from alcohol.  Patient is intoxicated so history is somewhat limited.  States that he would really like some Ativan.  States he has an appointment at our care tomorrow to start detox.  Denies suicidal homicidal ideation denies hallucinations.   The history is provided by the patient.    Past Medical History:  Diagnosis Date  . Active smoker   . Alcohol abuse   . Anxiety   . Bipolar 2 disorder (HCC)   . Depression   . PTSD (post-traumatic stress disorder)     Patient Active Problem List   Diagnosis Date Noted  . Alcohol abuse with alcohol-induced mood disorder (HCC) 11/07/2018  . 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 EtOH dependence (HCC) 12/11/2017  . Alcohol withdrawal (HCC) 12/10/2017  . Seizure due to alcohol withdrawal (HCC) 08/30/2017  . Alcohol withdrawal seizure (HCC) 08/30/2017  . Tobacco abuse   . Delirium tremens (HCC) 03/02/2017  . GI bleed 10/06/2013  . Marijuana abuse 10/06/2013  . Suicidal ideation 10/05/2013  . Alcohol abuse 10/26/2011  . Depression 10/26/2011    Past Surgical History:  Procedure Laterality Date  . LYMPH NODE DISSECTION          Home Medications    Prior to Admission medications   Medication Sig Start Date End Date Taking? Authorizing Provider  chlordiazePOXIDE (LIBRIUM) 25 MG capsule 50mg  PO TID x 1D, then 25-50mg  PO BID X 1D, then 25-50mg  PO QD X 1D 03/01/19   Melene Plan, DO  gabapentin (NEURONTIN) 100 MG capsule Take 2 capsules (200 mg total) by mouth 2 (two) times daily. Patient not taking: Reported on 11/25/2018 11/07/18   Laveda Abbe, NP    Family History  Family History  Problem Relation Age of Onset  . Alcohol abuse Father     Social History Social History   Tobacco Use  . Smoking status: Current Every Day Smoker    Packs/day: 2.00    Years: 20.00    Pack years: 40.00    Types: Cigarettes  . Smokeless tobacco: Never Used  Substance Use Topics  . Alcohol use: Yes    Comment: Patient states he is a binge drinker.  . Drug use: Yes    Types: Marijuana    Comment: Daily. Last used: 2 days ago      Allergies   Paxil [paroxetine hcl]   Review of Systems Review of Systems  Constitutional: Negative for chills and fever.  HENT: Negative for congestion and facial swelling.   Eyes: Negative for discharge and visual disturbance.  Respiratory: Negative for shortness of breath.   Cardiovascular: Negative for chest pain and palpitations.  Gastrointestinal: Negative for abdominal pain, diarrhea and vomiting.  Musculoskeletal: Negative for arthralgias and myalgias.  Skin: Negative for color change and rash.  Neurological: Negative for tremors, syncope and headaches.  Psychiatric/Behavioral: Negative for confusion and dysphoric mood.     Physical Exam Updated Vital Signs BP (!) 140/105   Pulse 96   Temp 98.1 F (36.7 C) (Oral)   Resp 16   Ht 5\' 10"  (1.778 m)   Wt 81.5  kg   SpO2 95%   BMI 25.78 kg/m   Physical Exam Vitals signs and nursing note reviewed.  Constitutional:      Appearance: He is well-developed.     Comments: Slow to respond.   HENT:     Head: Normocephalic and atraumatic.  Eyes:     Pupils: Pupils are equal, round, and reactive to light.  Neck:     Musculoskeletal: Normal range of motion and neck supple.     Vascular: No JVD.  Cardiovascular:     Rate and Rhythm: Normal rate and regular rhythm.     Heart sounds: No murmur. No friction rub. No gallop.   Pulmonary:     Effort: No respiratory distress.     Breath sounds: No wheezing.  Abdominal:     General: There is no distension.     Tenderness:  There is no guarding or rebound.  Musculoskeletal: Normal range of motion.  Skin:    Coloration: Skin is not pale.     Findings: No rash.  Neurological:     Mental Status: He is alert and oriented to person, place, and time.  Psychiatric:        Behavior: Behavior normal.      ED Treatments / Results  Labs (all labs ordered are listed, but only abnormal results are displayed) Labs Reviewed - No data to display  EKG None  Radiology No results found.  Procedures Procedures (including critical care time)  Medications Ordered in ED Medications  chlordiazePOXIDE (LIBRIUM) capsule 50 mg (50 mg Oral Given 03/01/19 1639)     Initial Impression / Assessment and Plan / ED Course  I have reviewed the triage vital signs and the nursing notes.  Pertinent labs & imaging results that were available during my care of the patient were reviewed by me and considered in my medical decision making (see chart for details).        48 yo M with a chief complaint of alcohol intoxication.  Patient is intoxicated but he is able to make his own decisions and is able to ambulate without assistance.  He is requesting Ativan which I discussed with him would have the possibility of making him more intoxicated and more likely to get into trouble.  I will prescribe him a taper of Librium.  He told me he has an appointment at [facility] to try and quit drinking tomorrow.  I encouraged him to follow-up.  4:41 PM:  I have discussed the diagnosis/risks/treatment options with the patient and believe the pt to be eligible for discharge home to follow-up with PCP. We also discussed returning to the ED immediately if new or worsening sx occur. We discussed the sx which are most concerning (e.g., sudden worsening pain, fever, inability to tolerate by mouth) that necessitate immediate return. Medications administered to the patient during their visit and any new prescriptions provided to the patient are listed below.   Medications given during this visit Medications  chlordiazePOXIDE (LIBRIUM) capsule 50 mg (50 mg Oral Given 03/01/19 1639)     The patient appears reasonably screen and/or stabilized for discharge and I doubt any other medical condition or other [facility] requiring further screening, evaluation, or treatment in the ED at this time prior to discharge.    Final Clinical Impressions(s) / ED Diagnoses   Final diagnoses:  Alcoholic intoxication without complication [facility])    ED Discharge Orders         Ordered    chlordiazePOXIDE (LIBRIUM) 25 MG  capsule     03/01/19 1635           Melene PlanFloyd, Vesna Kable, DO 03/01/19 1641

## 2020-02-09 ENCOUNTER — Encounter: Payer: Self-pay | Admitting: Gastroenterology

## 2020-02-28 ENCOUNTER — Ambulatory Visit: Payer: Self-pay | Admitting: Gastroenterology

## 2020-06-24 ENCOUNTER — Emergency Department (HOSPITAL_COMMUNITY)
Admission: EM | Admit: 2020-06-24 | Discharge: 2020-06-24 | Disposition: A | Discharge status: Home / Self Care | Payer: No Typology Code available for payment source | Attending: Emergency Medicine | Admitting: Emergency Medicine

## 2020-06-24 ENCOUNTER — Other Ambulatory Visit: Payer: Self-pay

## 2020-06-24 ENCOUNTER — Encounter (HOSPITAL_COMMUNITY): Payer: Self-pay | Admitting: *Deleted

## 2020-06-24 DIAGNOSIS — F10129 Alcohol abuse with intoxication, unspecified: Secondary | ICD-10-CM | POA: Diagnosis present

## 2020-06-24 DIAGNOSIS — F17210 Nicotine dependence, cigarettes, uncomplicated: Secondary | ICD-10-CM | POA: Insufficient documentation

## 2020-06-24 DIAGNOSIS — F1010 Alcohol abuse, uncomplicated: Secondary | ICD-10-CM

## 2020-06-24 DIAGNOSIS — F10929 Alcohol use, unspecified with intoxication, unspecified: Secondary | ICD-10-CM

## 2020-06-24 LAB — CBC
HCT: 43.8 % (ref 39.0–52.0)
Hemoglobin: 15.1 g/dL (ref 13.0–17.0)
MCH: 27.4 pg (ref 26.0–34.0)
MCHC: 34.5 g/dL (ref 30.0–36.0)
MCV: 79.3 fL — ABNORMAL LOW (ref 80.0–100.0)
Platelets: 220 10*3/uL (ref 150–400)
RBC: 5.52 MIL/uL (ref 4.22–5.81)
RDW: 14.7 % (ref 11.5–15.5)
WBC: 7.5 10*3/uL (ref 4.0–10.5)
nRBC: 0 % (ref 0.0–0.2)

## 2020-06-24 LAB — COMPREHENSIVE METABOLIC PANEL
ALT: 53 U/L — ABNORMAL HIGH (ref 0–44)
AST: 74 U/L — ABNORMAL HIGH (ref 15–41)
Albumin: 4.8 g/dL (ref 3.5–5.0)
Alkaline Phosphatase: 57 U/L (ref 38–126)
Anion gap: 18 — ABNORMAL HIGH (ref 5–15)
BUN: 11 mg/dL (ref 6–20)
CO2: 24 mmol/L (ref 22–32)
Calcium: 8.8 mg/dL — ABNORMAL LOW (ref 8.9–10.3)
Chloride: 96 mmol/L — ABNORMAL LOW (ref 98–111)
Creatinine, Ser: 0.72 mg/dL (ref 0.61–1.24)
GFR calc Af Amer: >60 mL/min (ref >60)
GFR calc non Af Amer: >60 mL/min (ref >60)
Glucose, Bld: 104 mg/dL — ABNORMAL HIGH (ref 70–99)
Potassium: 3.8 mmol/L (ref 3.5–5.1)
Sodium: 138 mmol/L (ref 135–145)
Total Bilirubin: 0.9 mg/dL (ref 0.3–1.2)
Total Protein: 8.4 g/dL — ABNORMAL HIGH (ref 6.5–8.1)

## 2020-06-24 LAB — RAPID URINE DRUG SCREEN, HOSP PERFORMED
Amphetamines: NOT DETECTED
Barbiturates: NOT DETECTED
Benzodiazepines: NOT DETECTED
Cocaine: NOT DETECTED
Opiates: NOT DETECTED
Tetrahydrocannabinol: NOT DETECTED

## 2020-06-24 LAB — ETHANOL: Alcohol, Ethyl (B): 444 mg/dL (ref <10)

## 2020-06-24 MED ORDER — THIAMINE HCL 100 MG/ML IJ SOLN: 100.0000 mg | Freq: Every day | INTRAMUSCULAR | Status: DC

## 2020-06-24 MED ORDER — LORAZEPAM 1 MG PO TABS: 0.0000 mg | ORAL_TABLET | Freq: Four times a day (QID) | ORAL | Status: DC

## 2020-06-24 MED ORDER — LORAZEPAM 1 MG PO TABS: 0.0000 mg | ORAL_TABLET | Freq: Two times a day (BID) | ORAL | Status: DC

## 2020-06-24 MED ORDER — LORAZEPAM 2 MG/ML IJ SOLN
0.0000 mg | Freq: Four times a day (QID) | INTRAMUSCULAR | Status: DC
  Filled 2020-06-24: qty 1

## 2020-06-24 MED ORDER — ONDANSETRON 8 MG PO TBDP: 8.0000 mg | ORAL_TABLET | Freq: Once | ORAL | Status: DC

## 2020-06-24 MED ORDER — LORAZEPAM 2 MG/ML IJ SOLN: 0.0000 mg | Freq: Two times a day (BID) | INTRAMUSCULAR | Status: DC

## 2020-06-24 MED ORDER — THIAMINE HCL 100 MG PO TABS: 100.0000 mg | ORAL_TABLET | Freq: Every day | ORAL | Status: DC

## 2020-06-24 MED ORDER — LORAZEPAM 2 MG/ML IJ SOLN
2.0000 mg | Freq: Once | INTRAMUSCULAR | Status: AC
  Administered 2020-06-24: 2 mg via INTRAVENOUS

## 2020-06-24 NOTE — ED Notes (Signed)
Date and time results received: 06/24/20 9:13 AM  (use smartphrase ".now" to insert current time)  Test: ETOH Critical Value: 444  Name of Provider Notified: [name], RN  Orders Received? Or Actions Taken?: Actions Taken: Notified Engineer, building services and Notified Primary RN

## 2020-06-24 NOTE — ED Triage Notes (Signed)
BIB [facility] after calling 911 due to his PTSD, intoxicated and crying in triage, vomiting heard on 911 call. Denies SI/HI

## 2020-06-24 NOTE — ED Notes (Signed)
Pt required [employer] and security to place IV for ordered meds. Pt continues to talk about clearing the building. Sitter at bedside

## 2020-06-24 NOTE — ED Notes (Signed)
PT IS CURRENTLY EATING LUNCH

## 2020-06-24 NOTE — [facility] Counselor (Addendum)
TTS attempted to assess patient [name], RN, report patient is unable to be assessed at this time. TTS will attempt to assess patient later this date.

## 2020-06-24 NOTE — ED Notes (Signed)
Discharge paperwork reviewed with pt.  Pt verbalized understanding, ambulatory with steady gait at discharge.

## 2020-06-24 NOTE — ED Notes (Signed)
Removed pt IV

## 2020-06-24 NOTE — ED Notes (Addendum)
PT PACING ROOM WANTS TO SPEAK WITH NURSE OR DOCTOR BECOMING UPSET. SAYS HE IS READY TO GO

## 2020-06-24 NOTE — ED Provider Notes (Signed)
[facility]-EMERGENCY DEPT Provider Note   CSN: [id number] Arrival date & time: 06/24/20  0759     History Chief Complaint  Patient presents with  . Alcohol Intoxication    [name] is a 49 y.o. male.  HPI   49 year old male history of B PAD, alcohol abuse, presents today intoxicated and tearful.  He states that he was drinking as he drinks every day.  He then called 911 and was tearful.  He currently denies suicidal or homicidal ideation.  Patient denies any fever, chills, cough, chest pain, exposure to Covid.  He reports that he has been vaccinated for Covid but cannot produce documentation.  Past Medical History:  Diagnosis Date  . Active smoker   . Alcohol abuse   . Anxiety   . Bipolar 2 disorder (HCC)   . Depression   . PTSD (post-traumatic stress disorder)     Patient Active Problem List   Diagnosis Date Noted  . Alcohol abuse with alcohol-induced mood disorder (HCC) 11/07/2018  . 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 EtOH dependence (HCC) 12/11/2017  . Alcohol withdrawal (HCC) 12/10/2017  . Seizure due to alcohol withdrawal (HCC) 08/30/2017  . Alcohol withdrawal seizure (HCC) 08/30/2017  . Tobacco abuse   . Delirium tremens (HCC) 03/02/2017  . GI bleed 10/06/2013  . Marijuana abuse 10/06/2013  . Suicidal ideation 10/05/2013  . Alcohol abuse 10/26/2011  . Depression 10/26/2011    Past Surgical History:  Procedure Laterality Date  . LYMPH NODE DISSECTION         Family History  Problem Relation Age of Onset  . Alcohol abuse Father     Social History   Tobacco Use  . Smoking status: Current Every Day Smoker    Packs/day: 2.00    Years: 20.00    Pack years: 40.00    Types: Cigarettes  . Smokeless tobacco: Never Used  Vaping Use  . Vaping Use: Never used  Substance Use Topics  . Alcohol use: Yes    Comment: Patient states he is a binge drinker.  . Drug use: Yes      Types: Marijuana    Home Medications Prior to Admission medications   Medication Sig Start Date End Date Taking? Authorizing Provider  chlordiazePOXIDE (LIBRIUM) 25 MG capsule 50mg  PO TID x 1D, then 25-50mg  PO BID X 1D, then 25-50mg  PO QD X 1D 03/01/19   03/03/19, DO  gabapentin (NEURONTIN) 100 MG capsule Take 2 capsules (200 mg total) by mouth 2 (two) times daily. Patient not taking: Reported on 11/25/2018 11/07/18   01/06/19, NP    Allergies    Paxil [paroxetine hcl]  Review of Systems   Review of Systems  All other systems reviewed and are negative.   Physical Exam Updated Vital Signs BP (!) 145/89 (BP Location: Right Arm)   Pulse (!) 109   Temp 98.3 F (36.8 C) (Oral)   Resp 18   SpO2 96%   Physical Exam Vitals reviewed.  Constitutional:      General: He is not in acute distress.    Appearance: Normal appearance.  HENT:     Head: Normocephalic.     Right Ear: External ear normal.     Left Ear: External ear normal.     Nose: Nose normal.     Mouth/Throat:     Mouth: Mucous membranes are moist.  Eyes:     Pupils: Pupils are equal, round, and  reactive to light.  Cardiovascular:     Rate and Rhythm: Normal rate and regular rhythm.     Pulses: Normal pulses.     Heart sounds: Normal heart sounds.  Pulmonary:     Effort: Pulmonary effort is normal.     Breath sounds: Normal breath sounds.  Abdominal:     General: Abdomen is flat.     Palpations: Abdomen is soft.  Musculoskeletal:        General: Normal range of motion.     Cervical back: Normal range of motion.  Skin:    General: Skin is warm and dry.     Capillary Refill: Capillary refill takes less than 2 seconds.  Neurological:     General: No focal deficit present.     Mental Status: He is alert and oriented to person, place, and time.  Psychiatric:        Mood and Affect: Mood is depressed.        Speech: Speech is delayed.        Behavior: Behavior is cooperative.        Thought  Content: Thought content does not include homicidal or suicidal ideation. Thought content does not include homicidal or suicidal plan.        Cognition and Memory: He exhibits impaired recent memory.        Judgment: Judgment is impulsive.     ED Results / Procedures / Treatments   Labs (all labs ordered are listed, but only abnormal results are displayed) Labs Reviewed  COMPREHENSIVE METABOLIC PANEL - Abnormal; Notable for the following components:      Result Value   Chloride 96 (*)    Glucose, Bld 104 (*)    Calcium 8.8 (*)    Total Protein 8.4 (*)    AST 74 (*)    ALT 53 (*)    Anion gap 18 (*)    All other components within normal limits  ETHANOL - Abnormal; Notable for the following components:   Alcohol, Ethyl (B) 444 (*)    All other components within normal limits  CBC - Abnormal; Notable for the following components:   MCV 79.3 (*)    All other components within normal limits  RAPID URINE DRUG SCREEN, HOSP PERFORMED    EKG EKG Interpretation  Date/Time:    Ventricular Rate:  105 PR Interval:    QRS Duration: 92 QT Interval:  339 QTC Calculation: 448 R Axis:   -29 Text Interpretation: Sinus tachycardia Otherwise within normal limits Confirmed by Margarita Grizzle [phone]) on 06/24/2020 2:06:22 PM   Radiology No results found.  Procedures Procedures (including critical care time)  Medications Ordered in ED Medications - No data to display  ED Course  I have reviewed the triage vital signs and the nursing notes.  Pertinent labs & imaging results that were available during my care of the patient were reviewed by me and considered in my medical decision making (see chart for details). 9:28 AM Patient agitated, tearful, trying to get up but staggering, stating repeatedly, "I just want to clear the building."  Patient has clinically sobered some at this time.  Heart rate has decreased to 100.  Girlfriend, [name] is at bedside.  We have discussed IVC.  Patient  currently is not suicidal or homicidal.  He does have a long history of alcohol abuse.  He does not wish to go through detox currently.  I have offered this to him.  We have discussed that he can  return or follow-up at the [place]. MDM Rules/Calculators/A&P                         Final Clinical Impression(s) / ED Diagnoses Final diagnoses:  Alcoholic intoxication with complication (HCC)  Chronic alcohol abuse    Rx / DC Orders ED Discharge Orders    None       Margarita Grizzle, MD 06/24/20 1406

## 2020-06-24 NOTE — Discharge Instructions (Addendum)
Please follow-up with your doctor. Return if you wish to go through alcohol detox

## 2020-06-24 NOTE — ED Notes (Signed)
Pt found to be in hallway yelling,"I need to clear this building." Pt was taken back to his room by security and [employer]. Pt continues to yell and be uncooperative with staff. Dr. Rosalia Hammers at bedside assessing pt

## 2023-03-10 ENCOUNTER — Other Ambulatory Visit: Payer: Self-pay

## 2023-03-10 ENCOUNTER — Emergency Department (HOSPITAL_COMMUNITY)
Admission: EM | Admit: 2023-03-10 | Discharge: 2023-03-11 | Disposition: A | Discharge status: Other Acute Inpatient Hospital | Payer: No Typology Code available for payment source | Attending: Emergency Medicine | Admitting: Emergency Medicine

## 2023-03-10 ENCOUNTER — Encounter (HOSPITAL_COMMUNITY): Payer: Self-pay

## 2023-03-10 DIAGNOSIS — Y908 Blood alcohol level of 240 mg/100 ml or more: Secondary | ICD-10-CM | POA: Insufficient documentation

## 2023-03-10 DIAGNOSIS — Z1152 Encounter for screening for COVID-19: Secondary | ICD-10-CM | POA: Insufficient documentation

## 2023-03-10 DIAGNOSIS — F10120 Alcohol abuse with intoxication, uncomplicated: Secondary | ICD-10-CM | POA: Diagnosis present

## 2023-03-10 DIAGNOSIS — F17210 Nicotine dependence, cigarettes, uncomplicated: Secondary | ICD-10-CM | POA: Diagnosis not present

## 2023-03-10 DIAGNOSIS — F332 Major depressive disorder, recurrent severe without psychotic features: Secondary | ICD-10-CM | POA: Diagnosis present

## 2023-03-10 DIAGNOSIS — F10920 Alcohol use, unspecified with intoxication, uncomplicated: Secondary | ICD-10-CM

## 2023-03-10 DIAGNOSIS — R45851 Suicidal ideations: Secondary | ICD-10-CM | POA: Insufficient documentation

## 2023-03-10 DIAGNOSIS — F1020 Alcohol dependence, uncomplicated: Secondary | ICD-10-CM | POA: Diagnosis present

## 2023-03-10 LAB — CBC WITH DIFFERENTIAL/PLATELET
Abs Immature Granulocytes: 0.01 10*3/uL (ref 0.00–0.07)
Basophils Absolute: 0.1 10*3/uL (ref 0.0–0.1)
Basophils Relative: 1 %
Eosinophils Absolute: 0.1 10*3/uL (ref 0.0–0.5)
Eosinophils Relative: 2 %
HCT: 45.2 % (ref 39.0–52.0)
Hemoglobin: 15.2 g/dL (ref 13.0–17.0)
Immature Granulocytes: 0 %
Lymphocytes Relative: 39 %
Lymphs Abs: 3.1 10*3/uL (ref 0.7–4.0)
MCH: 27.1 pg (ref 26.0–34.0)
MCHC: 33.6 g/dL (ref 30.0–36.0)
MCV: 80.6 fL (ref 80.0–100.0)
Monocytes Absolute: 0.6 10*3/uL (ref 0.1–1.0)
Monocytes Relative: 7 %
Neutro Abs: 4.2 10*3/uL (ref 1.7–7.7)
Neutrophils Relative %: 51 %
Platelets: 181 10*3/uL (ref 150–400)
RBC: 5.61 MIL/uL (ref 4.22–5.81)
RDW: 14.4 % (ref 11.5–15.5)
WBC: 8.1 10*3/uL (ref 4.0–10.5)
nRBC: 0 % (ref 0.0–0.2)

## 2023-03-10 LAB — COMPREHENSIVE METABOLIC PANEL
ALT: 44 U/L (ref 0–44)
AST: 52 U/L — ABNORMAL HIGH (ref 15–41)
Albumin: 4.4 g/dL (ref 3.5–5.0)
Alkaline Phosphatase: 55 U/L (ref 38–126)
Anion gap: 12 (ref 5–15)
BUN: 8 mg/dL (ref 6–20)
CO2: 20 mmol/L — ABNORMAL LOW (ref 22–32)
Calcium: 8.8 mg/dL — ABNORMAL LOW (ref 8.9–10.3)
Chloride: 108 mmol/L (ref 98–111)
Creatinine, Ser: 0.66 mg/dL (ref 0.61–1.24)
GFR, Estimated: >60 mL/min (ref >60)
Glucose, Bld: 96 mg/dL (ref 70–99)
Potassium: 3.9 mmol/L (ref 3.5–5.1)
Sodium: 140 mmol/L (ref 135–145)
Total Bilirubin: 0.7 mg/dL (ref 0.3–1.2)
Total Protein: 8.2 g/dL — ABNORMAL HIGH (ref 6.5–8.1)

## 2023-03-10 LAB — ETHANOL: Alcohol, Ethyl (B): 274 mg/dL — ABNORMAL HIGH (ref <10)

## 2023-03-10 LAB — RAPID URINE DRUG SCREEN, HOSP PERFORMED
Amphetamines: NOT DETECTED
Barbiturates: NOT DETECTED
Benzodiazepines: NOT DETECTED
Cocaine: NOT DETECTED
Opiates: NOT DETECTED
Tetrahydrocannabinol: NOT DETECTED

## 2023-03-10 LAB — ACETAMINOPHEN LEVEL: Acetaminophen (Tylenol), Serum: <10 ug/mL — ABNORMAL LOW (ref 10–30)

## 2023-03-10 LAB — SALICYLATE LEVEL: Salicylate Lvl: <7 mg/dL — ABNORMAL LOW (ref 7.0–30.0)

## 2023-03-10 MED ORDER — LORAZEPAM 1 MG PO TABS: 0.0000 mg | ORAL_TABLET | Freq: Two times a day (BID) | ORAL | Status: DC

## 2023-03-10 MED ORDER — LORAZEPAM 2 MG/ML IJ SOLN: 0.0000 mg | Freq: Four times a day (QID) | INTRAMUSCULAR | Status: DC

## 2023-03-10 MED ORDER — SODIUM CHLORIDE 0.9 % IV BOLUS
1000.0000 mL | Freq: Once | INTRAVENOUS | Status: AC
  Administered 2023-03-10: 1000 mL via INTRAVENOUS

## 2023-03-10 MED ORDER — LORAZEPAM 1 MG PO TABS
0.0000 mg | ORAL_TABLET | Freq: Four times a day (QID) | ORAL | Status: DC
  Administered 2023-03-10 – 2023-03-11 (×2): 1 mg via ORAL
  Filled 2023-03-10 (×2): qty 1

## 2023-03-10 MED ORDER — GABAPENTIN 100 MG PO CAPS: 200.0000 mg | ORAL_CAPSULE | Freq: Three times a day (TID) | ORAL | Status: DC

## 2023-03-10 MED ORDER — NICOTINE 14 MG/24HR TD PT24
14.0000 mg | MEDICATED_PATCH | Freq: Every day | TRANSDERMAL | Status: DC
  Filled 2023-03-10: qty 1

## 2023-03-10 MED ORDER — HYDROXYZINE HCL 25 MG PO TABS
25.0000 mg | ORAL_TABLET | Freq: Three times a day (TID) | ORAL | Status: DC
  Administered 2023-03-10 – 2023-03-11 (×3): 25 mg via ORAL
  Filled 2023-03-10 (×3): qty 1

## 2023-03-10 MED ORDER — LORAZEPAM 2 MG/ML IJ SOLN
1.0000 mg | Freq: Once | INTRAMUSCULAR | Status: AC
  Administered 2023-03-10: 1 mg via INTRAVENOUS
  Filled 2023-03-10: qty 1

## 2023-03-10 MED ORDER — SODIUM CHLORIDE 0.9 % IV BOLUS: 500.0000 mL | Freq: Once | INTRAVENOUS | Status: DC

## 2023-03-10 MED ORDER — THIAMINE HCL 100 MG/ML IJ SOLN: 100.0000 mg | Freq: Every day | INTRAMUSCULAR | Status: DC

## 2023-03-10 MED ORDER — THIAMINE MONONITRATE 100 MG PO TABS
100.0000 mg | ORAL_TABLET | Freq: Every day | ORAL | Status: DC
  Administered 2023-03-10 – 2023-03-11 (×2): 100 mg via ORAL
  Filled 2023-03-10 (×2): qty 1

## 2023-03-10 MED ORDER — LORAZEPAM 2 MG/ML IJ SOLN: 0.0000 mg | Freq: Two times a day (BID) | INTRAMUSCULAR | Status: DC

## 2023-03-10 MED ORDER — GABAPENTIN 100 MG PO CAPS
200.0000 mg | ORAL_CAPSULE | Freq: Two times a day (BID) | ORAL | Status: DC
  Administered 2023-03-10 – 2023-03-11 (×2): 200 mg via ORAL
  Filled 2023-03-10 (×2): qty 2

## 2023-03-10 MED ORDER — ONDANSETRON 4 MG PO TBDP
4.0000 mg | ORAL_TABLET | ORAL | Status: DC | PRN
  Administered 2023-03-10: 4 mg via ORAL
  Filled 2023-03-10: qty 1

## 2023-03-10 NOTE — Consult Note (Signed)
Attempt to see this patient failed due to his inability to participate in the evaluation as a result of Alcohol intoxication and recently medicated with Ativan.  Provider will try again later.

## 2023-03-10 NOTE — Consult Note (Cosign Needed Addendum)
[facility] ED ASSESSMENT   Reason for Consult:  Psychiatry evaluation Referring Physician:  ER Physician Patient Identification: [name] MRN:  [id number] ED Chief Complaint: Alcohol use disorder, severe, dependence (HCC)  Diagnosis:  Principal Problem:   Alcohol use disorder, severe, dependence (HCC) Active Problems:   MDD (major depressive disorder), recurrent severe, without psychosis [facility])  ED Assessment Time Calculation: Start Time: 1742 Stop Time: 1813 Total Time in Minutes (Assessment Completion): 31   Subjective:   [name] is a 52 y.o. male patient admitted with previous hx of PTSD, Bipolar disorder, Depression and anxiety, Alcohol use disorder was brought in by GF after heavy drinking.  Patient has not been eating or drinking fluids but has been drinking Alcohol.  Patient was discharged from [facility] Detox unit 4-5 days ago.Marland Kitchen  HPI:  Patient was seen in the room awake and eating.  He admitted he was discharged from [facility] Mental health unit after detox from Alcohol.  He say he relapsed few days after discharge.  Patient reports he bring drinks Alcohol.  He drinks heavily-20-30 beers a week and then for two to three months he does not drink.   He also reports very poor appetite as well.  Patient reports hx of withdrawal seizures and states Gabapentin is what he is given.  Patient does not take any Psychotropic medications but uses Medical Marijuana he says.  He engages in counseling at [facility] and also receives outpatient Mental healthcare.  Patient described his mood as bad and mood has something to do with his boys.  He would not explain further.  Patient is alert and oriented x5, reports anxiety and fingers noted with mild tremors.  Patient denies HI/AVH and no mention of paranoia but admits to feeling suicidal with no plan Alcohol level on arrival was 274 and UDS is negative.  AST is slightly elevated.  Patient is placed on CIWA protocol with Ativan  coverage.  We will seek inpatient Detox treatment.  We will contact the [facility] hospital for available bed.  If no bed we will fax records out to area hospitals for bed. Past Psychiatric History: previous hx of PTSD, Bipolar disorder, Depression and anxiety, Alcohol use disorder.  Veteran of [employer], receives care at [facility] and [place]. He says.  Patient was discharged from [facility] Detox unit 5 days ago.  Risk to Self or Others: Is the patient at risk to self? Yes Has the patient been a risk to self in the past 6 months? No Has the patient been a risk to self within the distant past? Yes Is the patient a risk to others? No Has the patient been a risk to others in the past 6 months? No Has the patient been a risk to others within the distant past? No  [place] Scale:  Flowsheet Row ED from 03/10/2023 in [facility] Emergency Department at [facility] ED from 02/18/2019 in [place] Emergency Department at [facility] ED from 04/03/2018 in [facility] Emergency Department at [facility]  C-SSRS RISK CATEGORY Error: Question 6 not populated No Risk No Risk       AIMS:  , , ,  ,   ASAM:    Substance Abuse:     Past Medical History:  Past Medical History:  Diagnosis Date   Active smoker    Alcohol abuse    Anxiety    Bipolar 2 disorder (HCC)    Depression  PTSD (post-traumatic stress disorder)     Past Surgical History:  Procedure Laterality Date   LYMPH NODE DISSECTION     Family History:  Family History  Problem Relation Age of Onset   Alcohol abuse Father    Family Psychiatric  History: not on record Social History:  Social History   Substance and Sexual Activity  Alcohol Use Yes   Comment: Patient states he is a binge drinker.     Social History   Substance and Sexual Activity  Drug Use Yes   Types: Marijuana    Social History   Socioeconomic History   Marital status: Legally Separated    Spouse name: Not on  file   Number of children: Not on file   Years of education: Not on file   Highest education level: Not on file  Occupational History   Not on file  Tobacco Use   Smoking status: Every Day    Packs/day: 2.00    Years: 20.00    Additional pack years: 0.00    Total pack years: 40.00    Types: Cigarettes   Smokeless tobacco: Never  Vaping Use   Vaping Use: Never used  Substance and Sexual Activity   Alcohol use: Yes    Comment: Patient states he is a binge drinker.   Drug use: Yes    Types: Marijuana   Sexual activity: Yes  Other Topics Concern   Not on file  Social History Narrative   Has an ex-wife and 76 yo son whom he cannot see now, as his ex-wife divorced him. Was in the [employer] and went to [place], [place], and may have some PTSD symptoms from this. Was also a Management consultant. Heavy alcohol abuse during binges, but has had periods of sobriety for months on end. Has been drinking up to two cases of beer a day   Social Determinants of Health   Financial Resource Strain: Not on file  Food Insecurity: Not on file  Transportation Needs: Not on file  Physical Activity: Not on file  Stress: Not on file  Social Connections: Not on file   Additional Social History:    Allergies:   Allergies  Allergen Reactions   Paxil [Paroxetine Hcl] Hives    Labs:  Results for orders placed or performed during the hospital encounter of 03/10/23 (from the past 48 hour(s))  Urine rapid drug screen (hosp performed)     Status: None   Collection Time: 03/10/23  1:20 PM  Result Value Ref Range   Opiates NONE DETECTED NONE DETECTED   Cocaine NONE DETECTED NONE DETECTED   Benzodiazepines NONE DETECTED NONE DETECTED   Amphetamines NONE DETECTED NONE DETECTED   Tetrahydrocannabinol NONE DETECTED NONE DETECTED   Barbiturates NONE DETECTED NONE DETECTED    Comment: (NOTE) DRUG SCREEN FOR MEDICAL PURPOSES ONLY.  IF CONFIRMATION IS NEEDED FOR ANY PURPOSE, NOTIFY LAB WITHIN 5  DAYS.  LOWEST DETECTABLE LIMITS FOR URINE DRUG SCREEN Drug Class                     Cutoff (ng/mL) Amphetamine and metabolites    1000 Barbiturate and metabolites    200 Benzodiazepine                 200 Opiates and metabolites        300 Cocaine and metabolites        300 THC  50 Performed at [facility], [address] [place], [place] [address]   CBC with Differential     Status: None   Collection Time: 03/10/23  1:49 PM  Result Value Ref Range   WBC 8.1 4.0 - 10.5 K/uL   RBC 5.61 4.22 - 5.81 MIL/uL   Hemoglobin 15.2 13.0 - 17.0 g/dL   HCT 60.4 54.0 - 98.1 %   MCV 80.6 80.0 - 100.0 fL   MCH 27.1 26.0 - 34.0 pg   MCHC 33.6 30.0 - 36.0 g/dL   RDW 19.1 47.8 - 29.5 %   Platelets 181 150 - 400 K/uL   nRBC 0.0 0.0 - 0.2 %   Neutrophils Relative % 51 %   Neutro Abs 4.2 1.7 - 7.7 K/uL   Lymphocytes Relative 39 %   Lymphs Abs 3.1 0.7 - 4.0 K/uL   Monocytes Relative 7 %   Monocytes Absolute 0.6 0.1 - 1.0 K/uL   Eosinophils Relative 2 %   Eosinophils Absolute 0.1 0.0 - 0.5 K/uL   Basophils Relative 1 %   Basophils Absolute 0.1 0.0 - 0.1 K/uL   Immature Granulocytes 0 %   Abs Immature Granulocytes 0.01 0.00 - 0.07 K/uL    Comment: Performed at [facility], [address] [place], [place] [address]  Ethanol     Status: Abnormal   Collection Time: 03/10/23  1:49 PM  Result Value Ref Range   Alcohol, Ethyl (B) 274 (H) <10 mg/dL    Comment: (NOTE) Lowest detectable limit for serum alcohol is 10 mg/dL.  For medical purposes only. Performed at [facility], [address] [place], [place] [address]   Comprehensive metabolic panel     Status: Abnormal   Collection Time: 03/10/23  1:49 PM  Result Value Ref Range   Sodium 140 135 - 145 mmol/L   Potassium 3.9 3.5 - 5.1 mmol/L   Chloride 108 98 - 111 mmol/L   CO2 20 (L) 22 - 32 mmol/L   Glucose, Bld 96 70 - 99 mg/dL    Comment: Glucose  reference range applies only to samples taken after fasting for at least 8 hours.   BUN 8 6 - 20 mg/dL   Creatinine, Ser 4.69 0.61 - 1.24 mg/dL   Calcium 8.8 (L) 8.9 - 10.3 mg/dL   Total Protein 8.2 (H) 6.5 - 8.1 g/dL   Albumin 4.4 3.5 - 5.0 g/dL   AST 52 (H) 15 - 41 U/L   ALT 44 0 - 44 U/L   Alkaline Phosphatase 55 38 - 126 U/L   Total Bilirubin 0.7 0.3 - 1.2 mg/dL   GFR, Estimated >62 >95 mL/min    Comment: (NOTE) Calculated using the CKD-EPI Creatinine Equation (2021)    Anion gap 12 5 - 15    Comment: Performed at [facility], [address] [place], [place] [address]  Acetaminophen level     Status: Abnormal   Collection Time: 03/10/23  1:49 PM  Result Value Ref Range   Acetaminophen (Tylenol), Serum <10 (L) 10 - 30 ug/mL    Comment: (NOTE) Therapeutic concentrations vary significantly. A range of 10-30 ug/mL  may be an effective concentration for many patients. However, some  are best treated at concentrations outside of this range. Acetaminophen concentrations >150 ug/mL at 4 hours after ingestion  and >50 ug/mL at 12 hours after ingestion are often associated with  toxic reactions.  Performed at [facility], [address] [place], [place] [address]   Salicylate level     Status: Abnormal   Collection Time: 03/10/23  1:49 PM  Result Value Ref Range   Salicylate Lvl <7.0 (L) 7.0 - 30.0 mg/dL    Comment: Performed at [facility], [address], [place], [place] [address]    Current Facility-Administered Medications  Medication Dose Route Frequency Provider Last Rate Last Admin   gabapentin (NEURONTIN) capsule 200 mg  200 mg Oral BID Dahlia Byes C, NP       LORazepam (ATIVAN) injection 0-4 mg  0-4 mg Intravenous Q6H Wynetta Fines, MD       Or   LORazepam (ATIVAN) tablet 0-4 mg  0-4 mg Oral Q6H Wynetta Fines, MD       [START ON 03/13/2023] LORazepam (ATIVAN) injection 0-4 mg  0-4 mg Intravenous  Q12H Wynetta Fines, MD       Or   Melene Muller ON 03/13/2023] LORazepam (ATIVAN) tablet 0-4 mg  0-4 mg Oral Q12H Wynetta Fines, MD       thiamine (VITAMIN B1) tablet 100 mg  100 mg Oral Daily Wynetta Fines, MD   100 mg at 03/10/23 1604   Or   thiamine (VITAMIN B1) injection 100 mg  100 mg Intravenous Daily Wynetta Fines, MD       Current Outpatient Medications  Medication Sig Dispense Refill   chlordiazePOXIDE (LIBRIUM) 25 MG capsule 50mg  PO TID x 1D, then 25-50mg  PO BID X 1D, then 25-50mg  PO QD X 1D 10 capsule 0   gabapentin (NEURONTIN) 100 MG capsule Take 2 capsules (200 mg total) by mouth 2 (two) times daily. (Patient not taking: Reported on 11/25/2018) 30 capsule 0    Musculoskeletal: Strength & Muscle Tone:  seen in bed lying down Gait & Station:  seen in bed lying down Patient leans:  see above   Psychiatric Specialty Exam: Presentation  General Appearance:  Casual; Disheveled  Eye Contact: Fleeting  Speech: Clear and Coherent; Normal Rate  Speech Volume: Normal  Handedness: Right   Mood and Affect  Mood: Depressed  Affect: Congruent; Depressed   Thought Process  Thought Processes: Coherent; Goal Directed; Linear  Descriptions of Associations:Intact  Orientation:Full (Time, Place and Person)  Thought Content:Logical  History of Schizophrenia/Schizoaffective disorder:No data recorded Duration of Psychotic Symptoms:No data recorded Hallucinations:Hallucinations: None  Ideas of Reference:None  Suicidal Thoughts:Suicidal Thoughts: Yes, Active SI Active Intent and/or Plan: Without Plan  Homicidal Thoughts:Homicidal Thoughts: No   Sensorium  Memory: Immediate Good; Recent Good; Remote Good  Judgment: Intact  Insight: Fair   Chartered certified accountant: Fair  Attention Span: Fair  Recall: Fair  Fund of Knowledge: Fair  Language: Fair   Psychomotor Activity  Psychomotor Activity: Psychomotor Activity:  Tremor   Assets  Assets: Manufacturing systems engineer; Desire for Improvement; Financial Resources/Insurance; Housing; Intimacy    Sleep  Sleep: Sleep: Good   Physical Exam: Physical Exam Vitals and nursing note reviewed.  HENT:     Nose: Nose normal.  Cardiovascular:     Rate and Rhythm: Normal rate and regular rhythm.  Pulmonary:     Effort: Pulmonary effort is normal.  Musculoskeletal:        General: Normal range of motion.     Cervical back: Normal range of motion.  Skin:    General: Skin is warm and dry.  Neurological:     Mental Status: He is alert and oriented to person, place, and time.  Psychiatric:        Attention and Perception: Attention and perception normal.        Mood and Affect: Mood is anxious and depressed. Affect is angry.        Speech: Speech normal.        Behavior: Behavior normal. Behavior is cooperative.        Thought Content: Thought content normal.        Cognition and Memory: Cognition and memory normal.        Judgment: Judgment is impulsive.    Review of Systems  Constitutional: Negative.   HENT: Negative.    Eyes: Negative.   Respiratory: Negative.    Cardiovascular: Negative.   Gastrointestinal: Negative.   Genitourinary: Negative.   Musculoskeletal: Negative.   Skin: Negative.   Neurological: Negative.   Endo/Heme/Allergies: Negative.   Psychiatric/Behavioral:  Positive for depression and suicidal ideas. The patient is nervous/anxious.    Blood pressure 106/73, pulse 93, temperature 98.1 F (36.7 C), temperature source Oral, resp. rate 17, height 5\' 10"  (1.778 m), weight 81.5 kg, SpO2 96 %. Body mass index is 25.78 kg/m.  Medical Decision Making: Patient came in intoxicated with Alcohol.  Recently discharged after Alcohol detox treatment.  Patient binge drinks and drinks heavily.  He meets criteria for inpatient Detox treatment considering a hx of Alcohol withdrawal seizures.  Gabapentin is prescribed. And he is on CIWA Protocol.   We will fax out records to facilities for bed placement [facility]  Problem 1: Alcohol use disorder, severe dependence  Problem 2: MDD, Recurrent, severe without Psychotic features.  Disposition:  admit, seek bed placement.  Earney Navy, NP-PMHNP-BC 03/10/2023 6:22 PM

## 2023-03-10 NOTE — ED Provider Notes (Signed)
[place] EMERGENCY DEPARTMENT AT [facility] Provider Note   CSN: [id number] Arrival date & time: 03/10/23  1303     History  Chief Complaint  Patient presents with   Alcohol Intoxication    Needs detox   Suicidal   Homicidal    [name] is a 52 y.o. male.  52 year old male with prior medical history as detailed below presents for evaluation.  Patient is accompanied by his girlfriend Teaching laboratory technician).  She provides majority of history.  Patient with longstanding history of PTSD and alcohol abuse.  Patient recently hospitalized for alcohol detox in [place] with the [place].  He left their facility approximately 5 days ago.  Per his girlfriend he began to drink again almost immediately.  He drinks primarily beer.  She reports that he drinks as much as he can get his hands on.  Today he called her while she was at work.  He told her that he was going to commit suicide.  In the past the patient has been placed on IVC hold when he alleges SI.  On arrival to the ED he is visibly intoxicated.  It is difficult to obtain a clear history from the patient in the state.  Per his girlfriend, the patient has had history of seizures associated with withdrawal.  He has never required intubation with withdrawal.  The history is provided by the patient and medical records.       Home Medications Prior to Admission medications   Medication Sig Start Date End Date Taking? Authorizing Provider  chlordiazePOXIDE (LIBRIUM) 25 MG capsule 50mg  PO TID x 1D, then 25-50mg  PO BID X 1D, then 25-50mg  PO QD X 1D 03/01/19   Melene Plan, DO  gabapentin (NEURONTIN) 100 MG capsule Take 2 capsules (200 mg total) by mouth 2 (two) times daily. Patient not taking: Reported on 11/25/2018 11/07/18   Laveda Abbe, NP      Allergies    Paxil [paroxetine hcl]    Review of Systems   Review of Systems  All other systems reviewed and are negative.   Physical Exam Updated Vital Signs BP 133/81  (BP Location: Right Arm)   Pulse (!) 113   Temp 98.1 F (36.7 C) (Oral)   Resp 16   SpO2 94%  Physical Exam Vitals and nursing note reviewed.  Constitutional:      General: He is not in acute distress.    Appearance: Normal appearance. He is well-developed.  HENT:     Head: Normocephalic and atraumatic.  Eyes:     Conjunctiva/sclera: Conjunctivae normal.     Pupils: Pupils are equal, round, and reactive to light.  Cardiovascular:     Rate and Rhythm: Regular rhythm. Tachycardia present.     Heart sounds: Normal heart sounds.  Pulmonary:     Effort: Pulmonary effort is normal. No respiratory distress.     Breath sounds: Normal breath sounds.  Abdominal:     General: There is no distension.     Palpations: Abdomen is soft.     Tenderness: There is no abdominal tenderness.  Musculoskeletal:        General: No deformity. Normal range of motion.     Cervical back: Normal range of motion and neck supple.  Skin:    General: Skin is warm and dry.  Neurological:     General: No focal deficit present.     Mental Status: He is alert and oriented to person, place, and time.  ED Results / Procedures / Treatments   Labs (all labs ordered are listed, but only abnormal results are displayed) Labs Reviewed  CBC WITH DIFFERENTIAL/PLATELET  ETHANOL  COMPREHENSIVE METABOLIC PANEL  ACETAMINOPHEN LEVEL  SALICYLATE LEVEL  RAPID URINE DRUG SCREEN, HOSP PERFORMED    EKG None  Radiology No results found.  Procedures Procedures    Medications Ordered in ED Medications  sodium chloride 0.9 % bolus 1,000 mL (has no administration in time range)  LORazepam (ATIVAN) injection 1 mg (has no administration in time range)    ED Course/ Medical Decision Making/ A&P                             Medical Decision Making Amount and/or Complexity of Data Reviewed Labs: ordered.  Risk Prescription drug management.    Medical Screen Complete  This patient presented to the ED  with complaint of alcohol abuse, suicidal ideation.  This complaint involves an extensive number of treatment options. The initial differential diagnosis includes, but is not limited to, intoxication, metabolic abnormality, with alcohol withdrawal, etc.  This presentation is: Acute, Chronic, Self-Limited, Previously Undiagnosed, Uncertain Prognosis, Complicated, Systemic Symptoms, and Threat to Life/Bodily Function  Patient with longstanding history of PTSD, alcohol abuse, alcohol withdrawal presents for reported suicidal ideation.  Patient is clinically intoxicated on initial evaluation.  Majority of history obtained from patient's significant other Colleen.  IVC hold initiated.   Screening labs remarkable for elevated alcohol at 274.  Patient without evidence of acute withdrawal at this time.   Patient will require TTS/Psych evaluation.    Additional history obtained: External records from outside sources obtained and reviewed including prior ED visits and prior Inpatient records.    Lab Tests:  I ordered and personally interpreted labs.   Cardiac Monitoring:  The patient was maintained on a cardiac monitor.  I personally viewed and interpreted the cardiac monitor which showed an underlying rhythm of: NSR   Medicines ordered:  I ordered medication including ativan  for agitation  Reevaluation of the patient after these medicines showed that the patient: improved    Problem List / ED Course:  SI, ETOH abuse   Reevaluation:  After the interventions noted above, I reevaluated the patient and found that they have: stayed the same   Disposition:  After consideration of the diagnostic results and the patients response to treatment, I feel that the patent would benefit from TTS/psych evaluation.          Final Clinical Impression(s) / ED Diagnoses Final diagnoses:  Suicidal ideation  Acute alcoholic intoxication without complication [facility])    Rx / DC  Orders ED Discharge Orders     None         Wynetta Fines, MD 03/10/23 [phone]

## 2023-03-10 NOTE — ED Triage Notes (Signed)
Patient arrived POV with girlfriend. Pt arrive to ED +ETOH.  Was admitted for medical Detox in [place] through the [place]. GF states he was released about 4-5 days ago. He has been drinking around the clock since. Has not been eating or drinkinng.  Also reports SI/HI. No plan

## 2023-03-10 NOTE — ED Notes (Signed)
Psych Provider at bedside. 

## 2023-03-10 NOTE — Progress Notes (Signed)
Per Earney Navy, NP-PMHNP-BC pt meets inpatient behavioral health placement. CSW requested [facility] Fransico Gil, RN to review.     Maryjean Ka, MSW, LCSWA 03/10/2023 10:05 PM

## 2023-03-11 LAB — SARS CORONAVIRUS 2 BY RT PCR: SARS Coronavirus 2 by RT PCR: NEGATIVE

## 2023-03-11 MED ORDER — NICOTINE POLACRILEX 2 MG MT GUM
2.0000 mg | CHEWING_GUM | OROMUCOSAL | Status: DC | PRN
  Administered 2023-03-11 (×2): 2 mg via ORAL
  Filled 2023-03-11 (×2): qty 1

## 2023-03-11 NOTE — ED Provider Notes (Signed)
Pt accepted to [place], Dr Casilda Carls.   Pt alert, content, no distress. No tremor or shakes.   Pt currently appears stable for transfer/transport.    Cathren Laine, MD 03/11/23 [phone]

## 2023-03-11 NOTE — ED Notes (Signed)
All belongings and paperwork provided to [facility] Department transport.  Pt remains unhappy about being transported but left w/ minimal commotion (yelling).  Report given to Will at the [place].

## 2023-03-11 NOTE — ED Provider Notes (Signed)
Emergency Medicine Observation Re-evaluation Note  [name] is a 52 y.o. male, seen on rounds today.  Pt initially presented to the ED for complaints of Alcohol Intoxication (Needs detox), Suicidal, and Homicidal Currently, the patient is awaiting placement.  Physical Exam  BP 124/77 (BP Location: Left Arm)   Pulse 73   Temp 98.3 F (36.8 C) (Oral)   Resp 16   Ht 5\' 10"  (1.778 m)   Wt 81.5 kg   SpO2 98%   BMI 25.78 kg/m  Physical Exam Alert and in no acute distress  ED Course / MDM  EKG:   I have reviewed the labs performed to date as well as medications administered while in observation.  Recent changes in the last 24 hours include none.  Plan  Current plan is for placement for substance abuse    Bethann Berkshire, MD 03/11/23 [phone]

## 2023-03-11 NOTE — Progress Notes (Addendum)
ADDENDUM  8:37 AM - CSW received phone call from April, Clinical cytogeneticist, at [facility]. April reports [facility] [place] does have available beds. CSW will complete Mental Health Transfer Sheet and [id number] form, and fax to [facility] [phone], for review. CSW notified NP and RN.  8:20 AM - CSW attempted to speak with Vernona Rieger [phone]. [phone], transfer coordinator, at [facility] via phone call to inquire about bed availability for inpatient psych treatment. CSW was unable to speak with Vernona Rieger, but left a voicemail requesting a call back. CSW will await return phone call from transfer coordinator.  Cathie Beams, [place]  03/11/2023 8:27 AM

## 2023-03-11 NOTE — Progress Notes (Addendum)
This CSW called [facility] [place] [phone] and spoke with AOD who confirmed that referral was coming through via fax, however referral will most likely not be reviewed during this shift by AOD. AOD shared that referral will be passed onto the Pt Care Coordinators for review. AOD was not able to confirm bed availability at this time and shared to call back after 8am. PENDING items to be completed if bed availability is open for [facility]: [employer] and [place] Form [id number]. This CSW utilized [employer] to send original referral to [facility] [place] system. 1st shift CSW Disposition Bethany Hendra, LCSW to follow up.    Maryjean Ka, MSW, LCSWA 03/11/2023 12:51 AM

## 2023-03-11 NOTE — ED Notes (Signed)
Attempted to call report to the [place].  [facility] until [facility] arrives.  [facility] called for transport.  Awaiting a return call w/ their ETA.

## 2023-03-11 NOTE — Consult Note (Signed)
Patient is refusing to go to the [place] for treatment and complete detox treatment.  He has ahx ofd Alcohol withdrawal seizures.  Patient is angry and accuses the doctors and his girl friend of lying against him.  He denies ever telling his GF that he is going to drink himself to death. Collateral information from GF MS Dawayne Cirri is that patient told her and made comment that he is suicidal and homicidal.  She states that once patient comes back he is going to keep drinking until he dies as he already planned to do.  MS [name] states that patient need to go back to [place] for more treatment and that it took him only 4-5 days after discharge from [place] and he went back drinking heavily. [facility].Marland Kitchen

## 2023-03-11 NOTE — Discharge Instructions (Signed)
Transfer to [facility].

## 2023-03-11 NOTE — Progress Notes (Signed)
Pt was accepted to [facility] TODAY 03/11/2023. Bed assignment: Building 8, 1st floor, room 133, bed: 1  Pt meets inpatient criteria per Dahlia Byes, NP  Attending Physician will be Ann Lions, MD  Report can be called to: [phone] ext. [phone] or [phone]  Bed is ready now  Care Team Notified: Dahlia Byes, NP and Lilli Light, RN  [place], LCSW  03/11/2023 3:21 PM

## 2023-03-11 NOTE — Progress Notes (Signed)
3:02 PM - CSW received return phone call from April, Clinical cytogeneticist, at [facility]. Pt is currently under review for inpatient treatment. April advised CSW that if we do not hear back from [place] by 4:30 PM, to contact the AOD [phone] to inquire about admission decision.   Cathie Beams, [place]  03/11/2023 3:06 PM

## 2023-03-11 NOTE — Progress Notes (Addendum)
ADDENDUM  2:45 PM - CSW attempted to speak with Vernona Rieger, Clinical cytogeneticist, at [facility] via phone call. CSW was unable to speak with Vernona Rieger, but left a voicemail requesting a call back regarding pt's referral for inpatient psych treatment. CSW will await follow up.  1:13 PM - CSW attempted to speak with April, transfer coordinator, at [facility] via phone call. CSW was unable to speak with April, but left a voicemail requesting a call back regarding pt's referral for inpatient psych treatment. CSW will await follow up.  11:02 AM - CSW sent mental health transfer sheet, [id number] form, negative covid results, and CIWA score to [facility] via fax [phone], for review. CSW will await follow up regarding pt's referral status.  Cathie Beams, LCSW  03/11/2023 11:03 AM

## 2023-03-11 NOTE — ED Notes (Signed)
Pt got very upset when informed he is under IVC and will be transported to the [place].  Pt reports he is an alcoholic, but not suicidal, has never been suicidal, and he wants to go home.  Pt is upset he was IVC'd due to statements made by his girlfriend upon arrival yesterday.  Pt gave verbal permission for Julieanne Cotton NP to speak to his girlfriend.  Pt continues to remain under IVC and will be transferred to the [place].

## 2023-03-24 ENCOUNTER — Inpatient Hospital Stay (HOSPITAL_COMMUNITY)
Admission: EM | Admit: 2023-03-24 | Discharge: 2023-03-26 | DRG: 894 | Discharge status: Against Medical Advice | Payer: No Typology Code available for payment source | Attending: Internal Medicine | Admitting: Internal Medicine

## 2023-03-24 ENCOUNTER — Other Ambulatory Visit: Payer: Self-pay

## 2023-03-24 DIAGNOSIS — F1210 Cannabis abuse, uncomplicated: Secondary | ICD-10-CM | POA: Diagnosis present

## 2023-03-24 DIAGNOSIS — F10939 Alcohol use, unspecified with withdrawal, unspecified: Secondary | ICD-10-CM | POA: Diagnosis present

## 2023-03-24 DIAGNOSIS — Z5329 Procedure and treatment not carried out because of patient's decision for other reasons: Secondary | ICD-10-CM | POA: Diagnosis not present

## 2023-03-24 DIAGNOSIS — Z888 Allergy status to other drugs, medicaments and biological substances status: Secondary | ICD-10-CM

## 2023-03-24 DIAGNOSIS — Z811 Family history of alcohol abuse and dependence: Secondary | ICD-10-CM

## 2023-03-24 DIAGNOSIS — I10 Essential (primary) hypertension: Secondary | ICD-10-CM | POA: Diagnosis present

## 2023-03-24 DIAGNOSIS — Z79899 Other long term (current) drug therapy: Secondary | ICD-10-CM

## 2023-03-24 DIAGNOSIS — R45851 Suicidal ideations: Secondary | ICD-10-CM | POA: Diagnosis present

## 2023-03-24 DIAGNOSIS — G40509 Epileptic seizures related to external causes, not intractable, without status epilepticus: Secondary | ICD-10-CM | POA: Diagnosis present

## 2023-03-24 DIAGNOSIS — M62830 Muscle spasm of back: Secondary | ICD-10-CM | POA: Diagnosis present

## 2023-03-24 DIAGNOSIS — Y907 Blood alcohol level of 200-239 mg/100 ml: Secondary | ICD-10-CM | POA: Diagnosis present

## 2023-03-24 DIAGNOSIS — F1020 Alcohol dependence, uncomplicated: Secondary | ICD-10-CM

## 2023-03-24 DIAGNOSIS — F419 Anxiety disorder, unspecified: Secondary | ICD-10-CM | POA: Diagnosis present

## 2023-03-24 DIAGNOSIS — R112 Nausea with vomiting, unspecified: Secondary | ICD-10-CM | POA: Diagnosis present

## 2023-03-24 DIAGNOSIS — F3181 Bipolar II disorder: Secondary | ICD-10-CM | POA: Diagnosis present

## 2023-03-24 DIAGNOSIS — F17210 Nicotine dependence, cigarettes, uncomplicated: Secondary | ICD-10-CM | POA: Diagnosis present

## 2023-03-24 DIAGNOSIS — F10231 Alcohol dependence with withdrawal delirium: Secondary | ICD-10-CM | POA: Diagnosis present

## 2023-03-24 DIAGNOSIS — F10931 Alcohol use, unspecified with withdrawal delirium: Secondary | ICD-10-CM

## 2023-03-24 DIAGNOSIS — R519 Headache, unspecified: Secondary | ICD-10-CM | POA: Diagnosis present

## 2023-03-24 DIAGNOSIS — F4310 Post-traumatic stress disorder, unspecified: Secondary | ICD-10-CM | POA: Diagnosis present

## 2023-03-24 DIAGNOSIS — F10930 Alcohol use, unspecified with withdrawal, uncomplicated: Principal | ICD-10-CM

## 2023-03-24 LAB — CBC WITH DIFFERENTIAL/PLATELET
Abs Immature Granulocytes: 0.02 10*3/uL (ref 0.00–0.07)
Basophils Absolute: 0 10*3/uL (ref 0.0–0.1)
Basophils Relative: 1 %
Eosinophils Absolute: 0.1 10*3/uL (ref 0.0–0.5)
Eosinophils Relative: 1 %
HCT: 43.7 % (ref 39.0–52.0)
Hemoglobin: 14.4 g/dL (ref 13.0–17.0)
Immature Granulocytes: 0 %
Lymphocytes Relative: 35 %
Lymphs Abs: 2 10*3/uL (ref 0.7–4.0)
MCH: 26.8 pg (ref 26.0–34.0)
MCHC: 33 g/dL (ref 30.0–36.0)
MCV: 81.2 fL (ref 80.0–100.0)
Monocytes Absolute: 0.4 10*3/uL (ref 0.1–1.0)
Monocytes Relative: 6 %
Neutro Abs: 3.3 10*3/uL (ref 1.7–7.7)
Neutrophils Relative %: 57 %
Platelets: 371 10*3/uL (ref 150–400)
RBC: 5.38 MIL/uL (ref 4.22–5.81)
RDW: 15 % (ref 11.5–15.5)
WBC: 5.8 10*3/uL (ref 4.0–10.5)
nRBC: 0 % (ref 0.0–0.2)

## 2023-03-24 LAB — PHOSPHORUS: Phosphorus: 3.2 mg/dL (ref 2.5–4.6)

## 2023-03-24 LAB — URINALYSIS, ROUTINE W REFLEX MICROSCOPIC
Bilirubin Urine: NEGATIVE
Glucose, UA: NEGATIVE mg/dL
Hgb urine dipstick: NEGATIVE
Ketones, ur: NEGATIVE mg/dL
Leukocytes,Ua: NEGATIVE
Nitrite: NEGATIVE
Protein, ur: NEGATIVE mg/dL
Specific Gravity, Urine: 1.004 — ABNORMAL LOW (ref 1.005–1.030)
pH: 5 (ref 5.0–8.0)

## 2023-03-24 LAB — COMPREHENSIVE METABOLIC PANEL
ALT: 23 U/L (ref 0–44)
AST: 32 U/L (ref 15–41)
Albumin: 4.1 g/dL (ref 3.5–5.0)
Alkaline Phosphatase: 53 U/L (ref 38–126)
Anion gap: 13 (ref 5–15)
BUN: 9 mg/dL (ref 6–20)
CO2: 23 mmol/L (ref 22–32)
Calcium: 8.4 mg/dL — ABNORMAL LOW (ref 8.9–10.3)
Chloride: 108 mmol/L (ref 98–111)
Creatinine, Ser: 0.72 mg/dL (ref 0.61–1.24)
GFR, Estimated: >60 mL/min (ref >60)
Glucose, Bld: 87 mg/dL (ref 70–99)
Potassium: 3.8 mmol/L (ref 3.5–5.1)
Sodium: 144 mmol/L (ref 135–145)
Total Bilirubin: 0.5 mg/dL (ref 0.3–1.2)
Total Protein: 7.2 g/dL (ref 6.5–8.1)

## 2023-03-24 LAB — ETHANOL: Alcohol, Ethyl (B): 217 mg/dL — ABNORMAL HIGH (ref <10)

## 2023-03-24 LAB — MAGNESIUM: Magnesium: 2.1 mg/dL (ref 1.7–2.4)

## 2023-03-24 LAB — HIV ANTIBODY (ROUTINE TESTING W REFLEX): HIV Screen 4th Generation wRfx: NONREACTIVE

## 2023-03-24 MED ORDER — CHLORDIAZEPOXIDE HCL 25 MG PO CAPS
25.0000 mg | ORAL_CAPSULE | Freq: Four times a day (QID) | ORAL | Status: DC
  Administered 2023-03-24 – 2023-03-25 (×4): 25 mg via ORAL
  Filled 2023-03-24 (×4): qty 1

## 2023-03-24 MED ORDER — LACTATED RINGERS IV SOLN: INTRAVENOUS | Status: AC

## 2023-03-24 MED ORDER — ADULT MULTIVITAMIN W/MINERALS CH
1.0000 | ORAL_TABLET | Freq: Every day | ORAL | Status: DC
  Administered 2023-03-24 – 2023-03-26 (×3): 1 via ORAL
  Filled 2023-03-24 (×3): qty 1

## 2023-03-24 MED ORDER — LOPERAMIDE HCL 2 MG PO CAPS: 2.0000 mg | ORAL_CAPSULE | ORAL | Status: DC | PRN

## 2023-03-24 MED ORDER — CHLORDIAZEPOXIDE HCL 25 MG PO CAPS: 25.0000 mg | ORAL_CAPSULE | Freq: Three times a day (TID) | ORAL | Status: DC

## 2023-03-24 MED ORDER — LACTATED RINGERS IV BOLUS
1000.0000 mL | Freq: Once | INTRAVENOUS | Status: AC
  Administered 2023-03-24: 1000 mL via INTRAVENOUS

## 2023-03-24 MED ORDER — THIAMINE HCL 100 MG/ML IJ SOLN
100.0000 mg | Freq: Once | INTRAMUSCULAR | Status: AC
  Administered 2023-03-24: 100 mg via INTRAVENOUS
  Filled 2023-03-24: qty 2

## 2023-03-24 MED ORDER — CHLORDIAZEPOXIDE HCL 25 MG PO CAPS: 25.0000 mg | ORAL_CAPSULE | ORAL | Status: DC

## 2023-03-24 MED ORDER — LORAZEPAM 2 MG/ML IJ SOLN
2.0000 mg | Freq: Once | INTRAMUSCULAR | Status: AC
  Administered 2023-03-24: 2 mg via INTRAVENOUS
  Filled 2023-03-24: qty 1

## 2023-03-24 MED ORDER — ENOXAPARIN SODIUM 40 MG/0.4ML IJ SOSY
40.0000 mg | PREFILLED_SYRINGE | Freq: Every day | INTRAMUSCULAR | Status: DC
  Administered 2023-03-24 – 2023-03-25 (×2): 40 mg via SUBCUTANEOUS
  Filled 2023-03-24 (×3): qty 0.4

## 2023-03-24 MED ORDER — THIAMINE MONONITRATE 100 MG PO TABS
100.0000 mg | ORAL_TABLET | Freq: Every day | ORAL | Status: DC
  Administered 2023-03-25 – 2023-03-26 (×2): 100 mg via ORAL
  Filled 2023-03-24 (×2): qty 1

## 2023-03-24 MED ORDER — LORAZEPAM 2 MG/ML IJ SOLN
1.0000 mg | INTRAMUSCULAR | Status: DC | PRN
  Administered 2023-03-24: 2 mg via INTRAVENOUS
  Administered 2023-03-25 (×2): 1 mg via INTRAVENOUS
  Administered 2023-03-25: 2 mg via INTRAVENOUS
  Filled 2023-03-24 (×4): qty 1

## 2023-03-24 MED ORDER — METOCLOPRAMIDE HCL 5 MG/ML IJ SOLN
10.0000 mg | Freq: Once | INTRAMUSCULAR | Status: AC
  Administered 2023-03-24: 10 mg via INTRAVENOUS
  Filled 2023-03-24: qty 2

## 2023-03-24 MED ORDER — LORAZEPAM 1 MG PO TABS
1.0000 mg | ORAL_TABLET | ORAL | Status: DC | PRN
  Administered 2023-03-25: 1 mg via ORAL
  Filled 2023-03-24: qty 1

## 2023-03-24 MED ORDER — ONDANSETRON 4 MG PO TBDP
4.0000 mg | ORAL_TABLET | Freq: Four times a day (QID) | ORAL | Status: DC | PRN
  Administered 2023-03-25 – 2023-03-26 (×2): 4 mg via ORAL
  Filled 2023-03-24 (×2): qty 1

## 2023-03-24 MED ORDER — CHLORDIAZEPOXIDE HCL 25 MG PO CAPS
50.0000 mg | ORAL_CAPSULE | Freq: Once | ORAL | Status: AC
  Administered 2023-03-24: 50 mg via ORAL
  Filled 2023-03-24: qty 2

## 2023-03-24 MED ORDER — LORAZEPAM 2 MG/ML IJ SOLN
1.0000 mg | Freq: Once | INTRAMUSCULAR | Status: AC
  Administered 2023-03-24: 1 mg via INTRAVENOUS
  Filled 2023-03-24: qty 1

## 2023-03-24 MED ORDER — FOLIC ACID 1 MG PO TABS
1.0000 mg | ORAL_TABLET | Freq: Every day | ORAL | Status: DC
  Administered 2023-03-24 – 2023-03-26 (×3): 1 mg via ORAL
  Filled 2023-03-24 (×3): qty 1

## 2023-03-24 MED ORDER — CHLORDIAZEPOXIDE HCL 25 MG PO CAPS: 25.0000 mg | ORAL_CAPSULE | Freq: Every day | ORAL | Status: DC

## 2023-03-24 MED ORDER — CHLORDIAZEPOXIDE HCL 25 MG PO CAPS: 25.0000 mg | ORAL_CAPSULE | Freq: Four times a day (QID) | ORAL | Status: DC | PRN

## 2023-03-24 MED ORDER — HYDROXYZINE HCL 25 MG PO TABS: 25.0000 mg | ORAL_TABLET | Freq: Four times a day (QID) | ORAL | Status: DC | PRN

## 2023-03-24 NOTE — ED Triage Notes (Signed)
Pt bib [facility] for ETOH. Pt states he drinks 24, 20oz beers a day. Wanted to be evaluated for ETOH detox. Last time he drank was 6hrs ago. He began feeling shaky. Denies dizziness, lightheadedness, No LOC or falls. Just feeling shaky and feels like those symptoms are going to progress.

## 2023-03-24 NOTE — ED Notes (Signed)
RN found pt wonder around room disconnected from cardiac monitor. RN reassured pt he is safe, and helped pt lay back in bed and reconnected to cardiac monitor. Pt given warm blankets.

## 2023-03-24 NOTE — H&P (Signed)
Date: 03/24/2023               Patient Name:  [name] MRN: [id number]  DOB: 1971-02-17 Age / Sex: 52 y.o., male   PCP: [facility]         Medical Service: Internal Medicine Teaching Service         Attending Physician: Dr. Mercie Eon, MD    First Contact: Dr. Modena Slater  Pager: [phone]  Second Contact: Dr. Champ Mungo  Pager: [phone]       After Hours (After 5p/  First Contact Pager: [phone]  weekends / holidays): Second Contact Pager: [phone]   Chief Concern: Alcohol withdrawal  History of Present Illness:   [name] is a 52 year old male living with alcohol use disorder, PTSD, tobacco use disorder, past alcohol withdrawal seizure and DT, who presented to the emergency room to seek alcohol detoxification.  Limited history obtained from patient because he is very somnolent after receiving Ativan and Librium in the emergency room.  Patient is awake to verbal stimulation but falls back to sleep very quickly. Patient states that his last alcoholic drink was 12 hours ago.  This morning he has tremors, nausea, and sweating.  He said that he lives by himself.  He would like his son to be the medical decision maker.  He denies any thoughts of suicide or hurting himself.  I cannot reach to any of his point of contact via phone.  Meds:  -Unable to obtain due to patient mental status  Allergies: Allergies as of 03/24/2023 - Review Complete 03/24/2023  Allergen Reaction Noted   Paroxetine hcl Hives and Rash 03/17/2014   Past Medical History:  Diagnosis Date   Active smoker    Alcohol abuse    Anxiety    Bipolar 2 disorder (HCC)    Depression    PTSD (post-traumatic stress disorder)     Family History:  Family History  Problem Relation Age of Onset   Alcohol abuse Father      Social History:  -Lives by himself. -Would like son to be the Management consultant -Currently working in [employer] company  Review of Systems: A complete  ROS was negative except as per HPI.   Physical Exam: Blood pressure 113/73, pulse 95, temperature (!) 97.5 F (36.4 C), temperature source Oral, resp. rate 19, SpO2 95 %. Physical Exam Constitutional:      Comments: Somnolent but awake to verbal stimulation.  Falls back to sleep very quickly.  Able to talk and protect his airway.  HENT:     Head: Normocephalic.  Eyes:     General:        Right eye: No discharge.        Left eye: No discharge.     Conjunctiva/sclera: Conjunctivae normal.  Cardiovascular:     Rate and Rhythm: Regular rhythm. Tachycardia present.     Heart sounds: No murmur heard. Pulmonary:     Effort: Pulmonary effort is normal. No respiratory distress.     Breath sounds: Normal breath sounds. No wheezing.  Abdominal:     General: Bowel sounds are normal. There is no distension.     Palpations: Abdomen is soft.     Tenderness: There is no abdominal tenderness.  Musculoskeletal:     Right lower leg: No edema.     Left lower leg: No edema.  Skin:    General: Skin is warm.  Neurological:     Comments: Somnolent  EKG: personally reviewed my interpretation is sinus rhythm  Assessment & Plan by Problem: Active Problems:   Alcohol withdrawal (HCC)   PTSD (post-traumatic stress disorder)  [name] is a 52 year old male living with alcohol use disorder, PTSD, tobacco use disorder, past alcohol withdrawal seizure and DT, who was hospitalized for alcohol withdrawal.  Alcohol withdrawal Patient is at a higher risk for poor outcome due to history of alcohol withdrawal seizure and delirium tremens.  I am concerned that he has withdrawal symptoms with an alcohol level of 200s.  He probably functions at a higher ethanol level.  Fortunately his blood work are unremarkable.  No signs of liver dysfunction. -Start Librium taper protocol -Continue CIWA with Ativan -Low threshold to consult ICU if symptoms are not controlled with above regimen -Thiamine and  multivitamins -IV fluids -Check phosphorus  PTSD I saw multiple medications such as bupropion, quetiapine and venlafaxine in [facility].  Not sure which one patient is actually taking.  Will need to do confirm with patient before restarting. -Patient had suicidal thoughts during his ED visit on 6/4.  He currently denies any suicidal thoughts.  Tobacco use disorder He may needs nicotine patch  Full code DVT: Lovenox IVF: LR Diet: Regular  Dispo: Admit patient to Inpatient with expected length of stay greater than 2 midnights.  Signed: Doran Stabler, DO 03/24/2023, 1:03 PM  Pager: [phone] After 5pm on weekdays and 1pm on weekends: On Call pager: [phone]

## 2023-03-24 NOTE — ED Provider Notes (Signed)
[place] EMERGENCY DEPARTMENT AT [facility] Provider Note   CSN: [id number] Arrival date & time: 03/24/23  0809     History  Chief Complaint  Patient presents with   Alcohol Problem    [name] is a 52 y.o. male.   Alcohol Problem Associated symptoms include headaches.  Patient presents for alcohol withdrawal.  Medical history includes alcohol abuse, marijuana abuse, alcohol withdrawal seizures, depression, anxiety.  He was sober up until 6 days ago.  Over the past 6 days, he has drank approximately 18 beers per day.  Yesterday he had his typical consumption and last drink was at 2 AM.  Normally, he starts drinking as soon as he wakes up.  Since waking up today, he has felt tremulous.  He endorses headache and nausea.  He has had severe withdrawals in the past, including withdrawal seizures.  He does wish to stop drinking.     Home Medications Prior to Admission medications   Medication Sig Start Date End Date Taking? Authorizing Provider  chlordiazePOXIDE (LIBRIUM) 25 MG capsule 50mg  PO TID x 1D, then 25-50mg  PO BID X 1D, then 25-50mg  PO QD X 1D 03/01/19   Melene Plan, DO  gabapentin (NEURONTIN) 100 MG capsule Take 2 capsules (200 mg total) by mouth 2 (two) times daily. 11/07/18   Laveda Abbe, NP  gabapentin (NEURONTIN) 300 MG capsule Take 300 mg by mouth 3 (three) times daily. For alcohol craving.    [provider]  nicotine polacrilex (NICORETTE) 2 MG gum Take 2 mg by mouth See admin instructions. CHEW 1 PIECE BY MOUTH EVERY TWO HOURS AS NEEDED FOR SMOKING CESSATION FOR SMOKING CESSATION. NOT TO EXCEED 12 PIECES IN 24 HOURS    [provider]      Allergies    Paroxetine hcl    Review of Systems   Review of Systems  Gastrointestinal:  Positive for nausea.  Neurological:  Positive for tremors and headaches.  All other systems reviewed and are negative.   Physical Exam Updated Vital Signs BP 98/68   Pulse (!) 103   Temp  98.7 F (37.1 C) (Oral)   Resp 20   SpO2 95%  Physical Exam Vitals and nursing note reviewed.  Constitutional:      General: He is not in acute distress.    Appearance: Normal appearance. He is well-developed. He is not ill-appearing, toxic-appearing or diaphoretic.  HENT:     Head: Normocephalic and atraumatic.     Right Ear: External ear normal.     Left Ear: External ear normal.     Nose: Nose normal.     Mouth/Throat:     Mouth: Mucous membranes are moist.  Eyes:     Extraocular Movements: Extraocular movements intact.     Conjunctiva/sclera: Conjunctivae normal.  Cardiovascular:     Rate and Rhythm: Normal rate and regular rhythm.  Pulmonary:     Effort: Pulmonary effort is normal. No respiratory distress.  Abdominal:     General: There is no distension.     Palpations: Abdomen is soft.  Musculoskeletal:        General: No swelling. Normal range of motion.     Cervical back: Normal range of motion and neck supple.  Skin:    General: Skin is warm and dry.     Coloration: Skin is not jaundiced or pale.  Neurological:     General: No focal deficit present.     Mental Status: He is alert and  oriented to person, place, and time.     Comments: Slight tremor  Psychiatric:        Mood and Affect: Mood normal.        Behavior: Behavior normal.     ED Results / Procedures / Treatments   Labs (all labs ordered are listed, but only abnormal results are displayed) Labs Reviewed  COMPREHENSIVE METABOLIC PANEL - Abnormal; Notable for the following components:      Result Value   Calcium 8.4 (*)    All other components within normal limits  ETHANOL - Abnormal; Notable for the following components:   Alcohol, Ethyl (B) 217 (*)    All other components within normal limits  CBC WITH DIFFERENTIAL/PLATELET  MAGNESIUM  URINALYSIS, ROUTINE W REFLEX MICROSCOPIC    EKG EKG Interpretation  Date/Time:  Tuesday March 24 2023 09:23:04 EDT Ventricular Rate:  98 PR  Interval:  155 QRS Duration: 103 QT Interval:  354 QTC Calculation: 452 R Axis:   23 Text Interpretation: Sinus rhythm Probable anteroseptal infarct, old Confirmed by Gloris Manchester [phone]) on 03/24/2023 9:52:20 AM  Radiology No results found.  Procedures Procedures    Medications Ordered in ED Medications  metoCLOPramide (REGLAN) injection 10 mg (has no administration in time range)  thiamine (VITAMIN B1) injection 100 mg (100 mg Intravenous Given 03/24/23 0917)  lactated ringers bolus 1,000 mL (0 mLs Intravenous Stopped 03/24/23 1025)  chlordiazePOXIDE (LIBRIUM) capsule 50 mg (50 mg Oral Given 03/24/23 0912)  LORazepam (ATIVAN) injection 1 mg (1 mg Intravenous Given 03/24/23 0913)  LORazepam (ATIVAN) injection 2 mg (2 mg Intravenous Given 03/24/23 1047)    ED Course/ Medical Decision Making/ A&P                             Medical Decision Making Amount and/or Complexity of Data Reviewed Labs: ordered.  Risk Prescription drug management.   This patient presents to the ED for concern of feeling shaky, this involves an extensive number of treatment options, and is a complaint that carries with it a high risk of complications and morbidity.  The differential diagnosis includes alcohol withdrawal, intoxication, other drug use, dehydration, metabolic derangements   Co morbidities that complicate the patient evaluation  alcohol abuse, marijuana abuse, alcohol withdrawal seizures, depression, anxiety   Additional history obtained:  Additional history obtained from N/A External records from outside source obtained and reviewed including EMR   Lab Tests:  I Ordered, and personally interpreted labs.  The pertinent results include: Normal kidney function, normal electrolytes, normal hemoglobin, no leukocytosis.  Ethanol level of 217.  Cardiac Monitoring: / EKG:  The patient was maintained on a cardiac monitor.  I personally viewed and interpreted the cardiac monitored which showed  an underlying rhythm of: Sinus rhythm  Problem List / ED Course / Critical interventions / Medication management  Patient presents for symptoms of alcohol withdrawal.  Current symptoms include headache, nausea, and tremulousness.  Patient is well-appearing on exam.  Initial vital signs are notable for hypertension.  He has a slight tremor.  IV fluids, Librium, and Ativan were ordered.  Laboratory workup was initiated.  Results were notable for ethanol level of 217.  Prior lab work has shown ethanol levels in the 400s.  On reassessment, patient endorses worsening symptoms of anxiety and tremulousness.  Additional Ativan was ordered. Given his current alcohol consumption, patient likely is functioning at a very high ethanol levels.  Given that he is withdrawing  at 217, in addition to need for repeated doses of Ativan in the ED, this does raise concern for severe worsening of alcohol withdrawals, if not treated and monitored.  Patient does wish to quit drinking.  He is in favor of admission.  Patient was admitted to medicine for further management. I ordered medication including Librium and Ativan for alcohol withdrawal; IV fluid for hydration; Reglan for headache and nausea. Reevaluation of the patient after these medicines showed that the patient improved I have reviewed the patients home medicines and have made adjustments as needed   Social Determinants of Health:  History of alcohol abuse  CRITICAL CARE Performed by: Gloris Manchester   Total critical care time: 32 minutes  Critical care time was exclusive of separately billable procedures and treating other patients.  Critical care was necessary to treat or prevent imminent or life-threatening deterioration.  Critical care was time spent personally by me on the following activities: development of treatment plan with patient and/or surrogate as well as nursing, discussions with consultants, evaluation of patient's response to treatment,  examination of patient, obtaining history from patient or surrogate, ordering and performing treatments and interventions, ordering and review of laboratory studies, ordering and review of radiographic studies, pulse oximetry and re-evaluation of patient's condition.        Final Clinical Impression(s) / ED Diagnoses Final diagnoses:  Alcohol withdrawal syndrome without complication [facility])    Rx / DC Orders ED Discharge Orders     None         Gloris Manchester, MD 03/24/23 1152

## 2023-03-24 NOTE — ED Notes (Signed)
ED TO INPATIENT HANDOFF REPORT  ED Nurse Name and Phone #: Nicholos Johns [phone]  S Name/Age/Gender [name] 52 y.o. male Room/Bed: 039C/039C  Code Status   Code Status: Full Code  Home/SNF/Other Home Patient oriented to: self, place, time, and situation Is this baseline? Yes   Triage Complete: Triage complete  Chief Complaint Alcohol withdrawal (HCC) [F10.939]  Triage Note Pt bib [facility] for ETOH. Pt states he drinks 24, 20oz beers a day. Wanted to be evaluated for ETOH detox. Last time he drank was 6hrs ago. He began feeling shaky. Denies dizziness, lightheadedness, No LOC or falls. Just feeling shaky and feels like those symptoms are going to progress.    Allergies Allergies  Allergen Reactions   Paroxetine Hcl Hives and Rash    Level of Care/Admitting Diagnosis ED Disposition     ED Disposition  Admit   Condition  --   Comment  Hospital Area: [facility] [[id number]]  Level of Care: Progressive [102]  Admit to Progressive based on following criteria: ACUTE MENTAL DISORDER-RELATED Drug/Alcohol Ingestion/Overdose/Withdrawal, Suicidal Ideation/attempt requiring safety sitter and < Q2h monitoring/assessments, moderate to severe agitation that is managed with medication/sitter, CIWA-Ar score < 20.  May admit patient to [facility] or [facility] if equivalent level of care is available:: No  Covid Evaluation: Asymptomatic - no recent exposure (last 10 days) testing not required  Diagnosis: Alcohol withdrawal (HCC) [291.81.ICD-9-CM]  Admitting Physician: Mercie Eon [[id number]]  Attending Physician: Mercie Eon [[id number]]  Certification:: I certify this patient will need inpatient services for at least 2 midnights  Estimated Length of Stay: 3          B Medical/Surgery History Past Medical History:  Diagnosis Date   Active smoker    Alcohol abuse    Anxiety    Bipolar 2 disorder (HCC)    Depression    PTSD (post-traumatic stress  disorder)    Past Surgical History:  Procedure Laterality Date   LYMPH NODE DISSECTION       A IV Location/Drains/Wounds Patient Lines/Drains/Airways Status     Active Line/Drains/Airways     Name Placement date Placement time Site Days   Peripheral IV 03/24/23 20 G Anterior;Left Forearm 03/24/23  0910  Forearm  less than 1            Intake/Output Last 24 hours No intake or output data in the 24 hours ending 03/24/23 1428  Labs/Imaging Results for orders placed or performed during the hospital encounter of 03/24/23 (from the past 48 hour(s))  Comprehensive metabolic panel     Status: Abnormal   Collection Time: 03/24/23  8:36 AM  Result Value Ref Range   Sodium 144 135 - 145 mmol/L   Potassium 3.8 3.5 - 5.1 mmol/L   Chloride 108 98 - 111 mmol/L   CO2 23 22 - 32 mmol/L   Glucose, Bld 87 70 - 99 mg/dL    Comment: Glucose reference range applies only to samples taken after fasting for at least 8 hours.   BUN 9 6 - 20 mg/dL   Creatinine, Ser 6.60 0.61 - 1.24 mg/dL   Calcium 8.4 (L) 8.9 - 10.3 mg/dL   Total Protein 7.2 6.5 - 8.1 g/dL   Albumin 4.1 3.5 - 5.0 g/dL   AST 32 15 - 41 U/L   ALT 23 0 - 44 U/L   Alkaline Phosphatase 53 38 - 126 U/L   Total Bilirubin 0.5 0.3 - 1.2 mg/dL   GFR, Estimated >63 >01  mL/min    Comment: (NOTE) Calculated using the CKD-EPI Creatinine Equation (2021)    Anion gap 13 5 - 15    Comment: Performed at [facility], [address], [place], [place] [address]  CBC with Differential/Platelet     Status: None   Collection Time: 03/24/23  8:36 AM  Result Value Ref Range   WBC 5.8 4.0 - 10.5 K/uL   RBC 5.38 4.22 - 5.81 MIL/uL   Hemoglobin 14.4 13.0 - 17.0 g/dL   HCT 19.1 47.8 - 29.5 %   MCV 81.2 80.0 - 100.0 fL   MCH 26.8 26.0 - 34.0 pg   MCHC 33.0 30.0 - 36.0 g/dL   RDW 62.1 30.8 - 65.7 %   Platelets 371 150 - 400 K/uL   nRBC 0.0 0.0 - 0.2 %   Neutrophils Relative % 57 %   Neutro Abs 3.3 1.7 - 7.7 K/uL   Lymphocytes Relative  35 %   Lymphs Abs 2.0 0.7 - 4.0 K/uL   Monocytes Relative 6 %   Monocytes Absolute 0.4 0.1 - 1.0 K/uL   Eosinophils Relative 1 %   Eosinophils Absolute 0.1 0.0 - 0.5 K/uL   Basophils Relative 1 %   Basophils Absolute 0.0 0.0 - 0.1 K/uL   Immature Granulocytes 0 %   Abs Immature Granulocytes 0.02 0.00 - 0.07 K/uL    Comment: Performed at [facility], [address] [place], [place] [address]  Urinalysis, Routine w reflex microscopic -Urine, Clean Catch     Status: Abnormal   Collection Time: 03/24/23  8:36 AM  Result Value Ref Range   Color, Urine STRAW (A) YELLOW   APPearance CLEAR CLEAR   Specific Gravity, Urine 1.004 (L) 1.005 - 1.030   pH 5.0 5.0 - 8.0   Glucose, UA NEGATIVE NEGATIVE mg/dL   Hgb urine dipstick NEGATIVE NEGATIVE   Bilirubin Urine NEGATIVE NEGATIVE   Ketones, ur NEGATIVE NEGATIVE mg/dL   Protein, ur NEGATIVE NEGATIVE mg/dL   Nitrite NEGATIVE NEGATIVE   Leukocytes,Ua NEGATIVE NEGATIVE    Comment: Performed at [facility], [address] [place], [place] [address]  Magnesium     Status: None   Collection Time: 03/24/23  8:36 AM  Result Value Ref Range   Magnesium 2.1 1.7 - 2.4 mg/dL    Comment: Performed at [facility], [address] [place], [place] [address]  Ethanol     Status: Abnormal   Collection Time: 03/24/23  8:37 AM  Result Value Ref Range   Alcohol, Ethyl (B) 217 (H) <10 mg/dL    Comment: (NOTE) Lowest detectable limit for serum alcohol is 10 mg/dL.  For medical purposes only. Performed at [facility], [address], [place], [place] [address]    No results found.  Pending Labs Unresulted Labs (From admission, onward)     Start     Ordered   03/25/23 0500  Comprehensive metabolic panel  Tomorrow morning,   R        03/24/23 1216   03/25/23 0500  CBC  Tomorrow morning,   R        03/24/23 1216   03/25/23 0500  Phosphorus  Tomorrow morning,   R        03/24/23 1216   03/24/23 1216  Phosphorus  Once,   R         03/24/23 1216   03/24/23 1215  HIV Antibody (routine testing w rflx)  (  HIV Antibody (Routine testing w reflex) panel)  Once,   R        03/24/23 1216            Vitals/Pain Today's Vitals   03/24/23 1330 03/24/23 1345 03/24/23 1400 03/24/23 1415  BP: 129/66 110/68 113/68 124/83  Pulse: 86 97 89 (!) 105  Resp:      Temp:      TempSrc:      SpO2: 98% 99% 99% 97%  PainSc:        Isolation Precautions No active isolations  Medications Medications  LORazepam (ATIVAN) tablet 1-4 mg (has no administration in time range)    Or  LORazepam (ATIVAN) injection 1-4 mg (has no administration in time range)  thiamine (VITAMIN B1) tablet 100 mg (100 mg Oral Not Given 03/24/23 1228)  folic acid (FOLVITE) tablet 1 mg (1 mg Oral Given 03/24/23 1243)  multivitamin with minerals tablet 1 tablet (1 tablet Oral Given 03/24/23 1243)  enoxaparin (LOVENOX) injection 40 mg (40 mg Subcutaneous Given 03/24/23 1244)  hydrOXYzine (ATARAX) tablet 25 mg (has no administration in time range)  loperamide (IMODIUM) capsule 2-4 mg (has no administration in time range)  ondansetron (ZOFRAN-ODT) disintegrating tablet 4 mg (has no administration in time range)  chlordiazePOXIDE (LIBRIUM) capsule 25 mg (25 mg Oral Given 03/24/23 1400)    Followed by  chlordiazePOXIDE (LIBRIUM) capsule 25 mg (has no administration in time range)    Followed by  chlordiazePOXIDE (LIBRIUM) capsule 25 mg (has no administration in time range)    Followed by  chlordiazePOXIDE (LIBRIUM) capsule 25 mg (has no administration in time range)  lactated ringers infusion ( Intravenous New Bag/Given 03/24/23 1402)  thiamine (VITAMIN B1) injection 100 mg (100 mg Intravenous Given 03/24/23 0917)  lactated ringers bolus 1,000 mL (0 mLs Intravenous Stopped 03/24/23 1025)  chlordiazePOXIDE (LIBRIUM) capsule 50 mg (50 mg Oral Given 03/24/23 0912)  LORazepam (ATIVAN) injection 1 mg (1 mg Intravenous Given 03/24/23 0913)  LORazepam (ATIVAN) injection  2 mg (2 mg Intravenous Given 03/24/23 1047)  metoCLOPramide (REGLAN) injection 10 mg (10 mg Intravenous Given 03/24/23 1243)    Mobility walks     Focused Assessments Cardiac Assessment Handoff:  Cardiac Rhythm: Normal sinus rhythm Lab Results  Component Value Date   CKTOTAL 213 12/10/2017   TROPONINI <0.03 12/10/2017   No results found for: "DDIMER" Does the Patient currently have chest pain? No    R Recommendations: See Admitting Provider Note  Report given to:   Additional Notes: .

## 2023-03-25 DIAGNOSIS — F10931 Alcohol use, unspecified with withdrawal delirium: Secondary | ICD-10-CM

## 2023-03-25 DIAGNOSIS — F1020 Alcohol dependence, uncomplicated: Secondary | ICD-10-CM

## 2023-03-25 LAB — COMPREHENSIVE METABOLIC PANEL
ALT: 20 U/L (ref 0–44)
AST: 26 U/L (ref 15–41)
Albumin: 3.3 g/dL — ABNORMAL LOW (ref 3.5–5.0)
Alkaline Phosphatase: 43 U/L (ref 38–126)
Anion gap: 9 (ref 5–15)
BUN: 11 mg/dL (ref 6–20)
CO2: 27 mmol/L (ref 22–32)
Calcium: 8.8 mg/dL — ABNORMAL LOW (ref 8.9–10.3)
Chloride: 104 mmol/L (ref 98–111)
Creatinine, Ser: 0.77 mg/dL (ref 0.61–1.24)
GFR, Estimated: >60 mL/min (ref >60)
Glucose, Bld: 77 mg/dL (ref 70–99)
Potassium: 3.5 mmol/L (ref 3.5–5.1)
Sodium: 140 mmol/L (ref 135–145)
Total Bilirubin: 1 mg/dL (ref 0.3–1.2)
Total Protein: 5.8 g/dL — ABNORMAL LOW (ref 6.5–8.1)

## 2023-03-25 LAB — CBC
HCT: 40.1 % (ref 39.0–52.0)
Hemoglobin: 13.5 g/dL (ref 13.0–17.0)
MCH: 27.7 pg (ref 26.0–34.0)
MCHC: 33.7 g/dL (ref 30.0–36.0)
MCV: 82.2 fL (ref 80.0–100.0)
Platelets: 320 10*3/uL (ref 150–400)
RBC: 4.88 MIL/uL (ref 4.22–5.81)
RDW: 15 % (ref 11.5–15.5)
WBC: 5.9 10*3/uL (ref 4.0–10.5)
nRBC: 0 % (ref 0.0–0.2)

## 2023-03-25 LAB — PHOSPHORUS: Phosphorus: 3.8 mg/dL (ref 2.5–4.6)

## 2023-03-25 MED ORDER — PHENOBARBITAL SODIUM 130 MG/ML IJ SOLN
130.0000 mg | INTRAMUSCULAR | Status: AC
  Administered 2023-03-25: 130 mg via INTRAVENOUS
  Filled 2023-03-25: qty 1

## 2023-03-25 MED ORDER — ACETAMINOPHEN 325 MG PO TABS
650.0000 mg | ORAL_TABLET | Freq: Four times a day (QID) | ORAL | Status: DC | PRN
  Administered 2023-03-25 – 2023-03-26 (×3): 650 mg via ORAL
  Filled 2023-03-25 (×3): qty 2

## 2023-03-25 MED ORDER — PHENOBARBITAL SODIUM 130 MG/ML IJ SOLN
130.0000 mg | Freq: Three times a day (TID) | INTRAMUSCULAR | Status: DC
  Administered 2023-03-25 – 2023-03-26 (×3): 130 mg via INTRAVENOUS
  Filled 2023-03-25 (×3): qty 1

## 2023-03-25 MED ORDER — PHENOBARBITAL 32.4 MG PO TABS: 64.8000 mg | ORAL_TABLET | Freq: Every day | ORAL | Status: DC

## 2023-03-25 MED ORDER — ORAL CARE MOUTH RINSE: 15.0000 mL | OROMUCOSAL | Status: DC | PRN

## 2023-03-25 MED ORDER — PHENOBARBITAL 32.4 MG PO TABS: 64.8000 mg | ORAL_TABLET | Freq: Three times a day (TID) | ORAL | Status: DC

## 2023-03-25 NOTE — Progress Notes (Addendum)
He's resting comfortably. Still arousable.  Plan Taper placed in order section  Next phenobarb is due at 8pm, if has breakthru symptoms can certainly have another 130mg  prior to that  10 minutes Simonne Martinet ACNP-BC [facility] Pulmonary/Critical Care Pager # [phone] OR # [phone] if no answer

## 2023-03-25 NOTE — Hospital Course (Addendum)
[name] is a 52 y.o. male with a past medical history of alcohol use disorder, PTSD, tobacco use disorder who presents to the emergency department with concerns of alcohol withdrawals.  With patient having history of prior alcohol withdrawal seizures and DTs, patient admitted for further evaluation and management of alcohol withdrawal.   #Alcohol use disorder #Alcohol withdrawal Overnight, patient did not require any Ativan.  On exam today, patient does look tremulous and diaphoretic.  Patient remains normotensive, and is is slightly tachycardic.  Most recent CIWA score has been 12.  At 0200 03/26/2023 patient will be approaching 48-hour window.  Will need to keep a close eye on patient.  Will continue with CIWA scoring with Ativan.  Will continue with Librium taper.  As we progress towards discharge, we can talk to patient about potential medications to help with alcohol cravings.  Will also put a referral in for social work. -Continue Librium taper -Continue CIWA with Ativan -Closely monitor vital signs -Consult to transitions of care for alcohol cessation resources   #Tobacco use disorder Patient has a history of cigarette use -Can offer nicotine patch if needed   #PTSD Patient has a history of PTSD.  Unclear what medications patient is on.  From home meds for now, He is on gabapentin.  Per charting, patient also has been on bupropion, quetiapine, venlafaxine.  Per patient, he does not take any medications at home. -Continue to monitor for any concerns  A.  Alcohol use disorder: Ensure patient refrains from alcohol use.  Patient discharged on***.  Provide refills as necessary.  B.  PTSD: Patient has a history of PTSD.  No medications currently.  Continue to monitor outpatient.  [name],  It was a pleasure taking care of you at [facility]. You were admitted for alcohol withdrawal symptoms.  You are treated with Librium and Ativan. We are discharging you home  now that you are doing better. Please follow the following instructions.   1) For alcohol use, I recommend you continue to refrain from alcohol use.  We have provided you resources for this.  We are discharging you on***.  This will help with alcohol cravings.  2) Please follow-up with your PCP in 1 week.  Will be for hospital follow-up appointment.  Take care,  Dr. Modena Slater, DO

## 2023-03-25 NOTE — Consult Note (Signed)
NAME:  [name], MRN:  [id number], DOB:  [birth year], LOS: 1 ADMISSION DATE:  03/24/2023, CONSULTATION DATE:  6/19 REFERRING MD:  Lafonda Mosses, CHIEF COMPLAINT:  alcohol wd  History of Present Illness:  75 yom w/ heavy ETOH hx. Had been sober up to about 6 d prior to admit at which time he began to drink appox 18 beers/d. Woke up 6/18 feeling tremulous so presented to ER w/ request to get assist w/ Detox. He was BIB [facility].  Was started on CIWA protocol w/ librium taper  Critical care was asked to see in consult as pt continues to be tremulous and exhibiting intermittent hallucinations in spite of 5 mg total ativan administered from 0828 to 1233 in addition to scheduled Librium, specifically w/ concerns as to if he would need higher level of care or change in ETOH w/x treatment strategy  Pertinent  Medical History  Etoh use disorder, PTSD, tobacco abuse, prior wd seizures and prior DTs  Significant Hospital Events: Including procedures, antibiotic start and stop dates in addition to other pertinent events   6/18 admitted for ETOH detox. Already tremulous and diaphoretic started on Librium and CIWA protocol  6/19 Critical care consulted. Symptoms persist in spite of Benzos. Librium and CIWA protocol stopped. Phenobarb initiated.   Interim History / Subjective:  Anxious, scared, tremulous   Objective   Blood pressure 108/79, pulse 95, temperature 98 F (36.7 C), temperature source Oral, resp. rate 20, weight 81.5 kg, SpO2 97 %.       No intake or output data in the 24 hours ending 03/25/23 1339 Filed Weights   03/25/23 0500  Weight: 81.5 kg    Examination: General: 52 year old male lying in bed. He is tremulous and diaphoretic  HENT: NCAT no JVD sclera not icteric  Lungs: clear  Cardiovascular: RRR  Abdomen: soft no tenderness Extremities: warm and dry w/ brisk CR Neuro: awake, oriented but easily confused. Having intermittent hallucinations.  GU: voids  Resolved Hospital  Problem list     Assessment & Plan:  Acute ETOH withdrawal with Delirium tremens  -subtherapeutic response to Benzo' s w/ both ativan and librium administered Plan Phenobarb load. Start 130mg  IV now, will repeat q 30 minutes until desired therapeutic effect (no longer anxious or tremulous) After that will initiate following taper: phenobarb 130 mg tid X 2 days, then 65 mg tid x 2d, then 65mg  q hs x 2 days (if doing well can transition to PO anytime after the initial 24 hr load.  Dc ativan dc libirum Cont pulse ox O2 as needed Watch for over sedation  Cont Multivits and thiamine   Best Practice (right click and "Reselect all SmartList Selections" daily)   Diet/type: Regular consistency (see orders) DVT prophylaxis: LMWH GI prophylaxis: N/A Lines: N/A Foley:  N/A Code Status:  full code Last date of multidisciplinary goals of care discussion [pending]  Labs   CBC: Recent Labs  Lab 03/24/23 0836 03/25/23 0403  WBC 5.8 5.9  NEUTROABS 3.3  --   HGB 14.4 13.5  HCT 43.7 40.1  MCV 81.2 82.2  PLT 371 320    Basic Metabolic Panel: Recent Labs  Lab 03/24/23 0836 03/24/23 1350 03/25/23 0403  NA 144  --  140  K 3.8  --  3.5  CL 108  --  104  CO2 23  --  27  GLUCOSE 87  --  77  BUN 9  --  11  CREATININE 0.72  --  0.77  CALCIUM 8.4*  --  8.8*  MG 2.1  --   --   PHOS  --  3.2 3.8   GFR: Estimated Creatinine Clearance: 112.8 mL/min (by C-G formula based on SCr of 0.77 mg/dL). Recent Labs  Lab 03/24/23 0836 03/25/23 0403  WBC 5.8 5.9    Liver Function Tests: Recent Labs  Lab 03/24/23 0836 03/25/23 0403  AST 32 26  ALT 23 20  ALKPHOS 53 43  BILITOT 0.5 1.0  PROT 7.2 5.8*  ALBUMIN 4.1 3.3*   No results for input(s): "LIPASE", "AMYLASE" in the last 168 hours. No results for input(s): "AMMONIA" in the last 168 hours.  ABG    Component Value Date/Time   TCO2 29 07/07/2017 0849     Coagulation Profile: No results for input(s): "INR", "PROTIME" in the  last 168 hours.  Cardiac Enzymes: No results for input(s): "CKTOTAL", "CKMB", "CKMBINDEX", "TROPONINI" in the last 168 hours.  HbA1C: No results found for: "HGBA1C"  CBG: No results for input(s): "GLUCAP" in the last 168 hours.  Review of Systems:   NA   Past Medical History:  He,  has a past medical history of Active smoker, Alcohol abuse, Anxiety, Bipolar 2 disorder (HCC), Depression, and PTSD (post-traumatic stress disorder).   Surgical History:   Past Surgical History:  Procedure Laterality Date   LYMPH NODE DISSECTION       Social History:   reports that he has been smoking cigarettes. He has a 40.00 pack-year smoking history. He has never used smokeless tobacco. He reports current alcohol use. He reports current drug use. Drug: Marijuana.   Family History:  His family history includes Alcohol abuse in his father.   Allergies Allergies  Allergen Reactions   Paroxetine Hcl Hives and Rash     Home Medications  Prior to Admission medications   Medication Sig Start Date End Date Taking? Authorizing Provider  chlordiazePOXIDE (LIBRIUM) 25 MG capsule 50mg  PO TID x 1D, then 25-50mg  PO BID X 1D, then 25-50mg  PO QD X 1D 03/01/19   Melene Plan, DO  gabapentin (NEURONTIN) 100 MG capsule Take 2 capsules (200 mg total) by mouth 2 (two) times daily. 11/07/18   Laveda Abbe, NP  gabapentin (NEURONTIN) 300 MG capsule Take 300 mg by mouth 3 (three) times daily. For alcohol craving.    [provider]  nicotine polacrilex (NICORETTE) 2 MG gum Take 2 mg by mouth See admin instructions. CHEW 1 PIECE BY MOUTH EVERY TWO HOURS AS NEEDED FOR SMOKING CESSATION FOR SMOKING CESSATION. NOT TO EXCEED 12 PIECES IN 24 HOURS    [provider]     Critical care time: 60 minutes     Simonne Martinet ACNP-BC [facility] Pulmonary/Critical Care Pager # [phone] OR # [phone] if no answer
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
## 2023-03-25 NOTE — Progress Notes (Signed)
Remains tremulous. Still anxious. Thinks he "might be a little better" Plan Will repeat pheno dosing at the 30 min mark  Simonne Martinet ACNP-BC [facility] Pulmonary/Critical Care Pager # [phone] OR # [phone] if no answer

## 2023-03-26 LAB — BASIC METABOLIC PANEL
Anion gap: 8 (ref 5–15)
BUN: 5 mg/dL — ABNORMAL LOW (ref 6–20)
CO2: 28 mmol/L (ref 22–32)
Calcium: 8.9 mg/dL (ref 8.9–10.3)
Chloride: 101 mmol/L (ref 98–111)
Creatinine, Ser: 0.8 mg/dL (ref 0.61–1.24)
GFR, Estimated: >60 mL/min (ref >60)
Glucose, Bld: 152 mg/dL — ABNORMAL HIGH (ref 70–99)
Potassium: 3.4 mmol/L — ABNORMAL LOW (ref 3.5–5.1)
Sodium: 137 mmol/L (ref 135–145)

## 2023-03-26 LAB — MAGNESIUM: Magnesium: 2 mg/dL (ref 1.7–2.4)

## 2023-03-26 MED ORDER — GABAPENTIN 300 MG PO CAPS
300.0000 mg | ORAL_CAPSULE | Freq: Three times a day (TID) | ORAL | Status: DC | PRN
  Administered 2023-03-26: 300 mg via ORAL
  Filled 2023-03-26: qty 1

## 2023-03-26 MED ORDER — HYDROXYZINE HCL 10 MG PO TABS
10.0000 mg | ORAL_TABLET | Freq: Three times a day (TID) | ORAL | Status: DC | PRN
  Administered 2023-03-26: 10 mg via ORAL
  Filled 2023-03-26 (×2): qty 1

## 2023-03-26 MED ORDER — PHENOBARBITAL SODIUM 130 MG/ML IJ SOLN
130.0000 mg | Freq: Once | INTRAMUSCULAR | Status: AC
  Administered 2023-03-26: 130 mg via INTRAVENOUS
  Filled 2023-03-26: qty 1

## 2023-03-26 MED ORDER — POTASSIUM CHLORIDE CRYS ER 20 MEQ PO TBCR
40.0000 meq | EXTENDED_RELEASE_TABLET | Freq: Once | ORAL | Status: AC
  Administered 2023-03-26: 40 meq via ORAL
  Filled 2023-03-26: qty 2

## 2023-03-26 MED ORDER — NICOTINE POLACRILEX 2 MG MT GUM
2.0000 mg | CHEWING_GUM | OROMUCOSAL | Status: DC | PRN
  Administered 2023-03-26: 2 mg via ORAL
  Filled 2023-03-26 (×2): qty 1

## 2023-03-26 MED ORDER — GABAPENTIN 300 MG PO CAPS: 300.0000 mg | ORAL_CAPSULE | Freq: Three times a day (TID) | ORAL | Status: DC

## 2023-03-26 MED ORDER — NICOTINE 21 MG/24HR TD PT24
21.0000 mg | MEDICATED_PATCH | Freq: Every day | TRANSDERMAL | Status: DC
  Filled 2023-03-26: qty 1

## 2023-03-26 MED ORDER — KETOROLAC TROMETHAMINE 15 MG/ML IJ SOLN
15.0000 mg | Freq: Once | INTRAMUSCULAR | Status: DC | PRN
  Administered 2023-03-26: 15 mg via INTRAVENOUS
  Filled 2023-03-26: qty 1
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
## 2023-03-26 NOTE — Progress Notes (Signed)
HD#2 Subjective:   Summary: This is a 52 year old male with a past medical history of alcohol use disorder, PTSD, tobacco use disorder who presents to the emergency department with concerns of alcohol withdrawals.  With patient having history of prior alcohol withdrawal seizures and DTs, patient admitted for further evaluation and management of alcohol withdrawal.  Overnight Events: Overnight, patient was given 130mg  dose of IV phenobarbital   Patient evaluated bedside this morning. Denies short of breath. Denies AH or VH. Denies n/v, headaches, diaphoresis, abdominal pain. Endorses some tremor. Did not want anything to eat. States he is ready to go home.   Objective:  Vital signs in last 24 hours: Vitals:   03/26/23 0245 03/26/23 0324 03/26/23 0341 03/26/23 0455  BP:  105/60    Pulse: 63 74 67 (!) 59  Resp: 15   17  Temp:  (!) 97.5 F (36.4 C)    TempSrc:  Oral    SpO2: 93% 98% 93% 95%  Weight:       Supplemental O2: Room Air SpO2: 95 %   Physical Exam:  Constitutional: Resting in bed, slightly less tremulous today than yesterday HENT: normocephalic atraumatic Cardiovascular: Regular rate and rhythm, no murmurs, rubs, or gallops Pulmonary/Chest: normal work of breathing on room air, lungs clear to auscultation bilaterally Neurological: Slightly tremulous Extremities: No pitting edema appreciated to bilateral lower extremities  Filed Weights   03/25/23 0500  Weight: 81.5 kg     Intake/Output Summary (Last 24 hours) at 03/26/2023 0655 Last data filed at 03/25/2023 2200 Gross per 24 hour  Intake 720 ml  Output --  Net 720 ml   Net IO Since Admission: 720 mL [03/26/23 0655]  Pertinent Labs:    Latest Ref Rng & Units 03/25/2023    4:03 AM 03/24/2023    8:36 AM 03/10/2023    1:49 PM  CBC  WBC 4.0 - 10.5 K/uL 5.9  5.8  8.1   Hemoglobin 13.0 - 17.0 g/dL 16.1  09.6  04.5   Hematocrit 39.0 - 52.0 % 40.1  43.7  45.2   Platelets 150 - 400 K/uL 320  371  181         Latest Ref Rng & Units 03/26/2023    4:26 AM 03/25/2023    4:03 AM 03/24/2023    8:36 AM  CMP  Glucose 70 - 99 mg/dL 409  77  87   BUN 6 - 20 mg/dL 5  11  9    Creatinine 0.61 - 1.24 mg/dL 8.11  9.14  7.82   Sodium 135 - 145 mmol/L 137  140  144   Potassium 3.5 - 5.1 mmol/L 3.4  3.5  3.8   Chloride 98 - 111 mmol/L 101  104  108   CO2 22 - 32 mmol/L 28  27  23    Calcium 8.9 - 10.3 mg/dL 8.9  8.8  8.4   Total Protein 6.5 - 8.1 g/dL  5.8  7.2   Total Bilirubin 0.3 - 1.2 mg/dL  1.0  0.5   Alkaline Phos 38 - 126 U/L  43  53   AST 15 - 41 U/L  26  32   ALT 0 - 44 U/L  20  23     Imaging: No results found.  Assessment/Plan:   Principal Problem:   Alcohol withdrawal (HCC) Active Problems:   PTSD (post-traumatic stress disorder)   Alcohol use disorder, severe, in controlled environment (HCC)   DTs (delirium tremens) (HCC)  Patient Summary: [name] is a 52 y.o. male with a past medical history of alcohol use disorder, PTSD, tobacco use disorder who presents to the emergency department with concerns of alcohol withdrawals.  With patient having history of prior alcohol withdrawal seizures and DTs, patient admitted for further evaluation and management of alcohol withdrawal.  #Alcohol use disorder #Alcohol withdrawal Patient is now past his 48-hour window, and approaching his 72-hour window.  Yesterday, given multiple rounds of Ativan despite being on Librium taper, patient was evaluated by critical care medicine who started a phenobarbital taper.  Patient evaluated bedside this morning, he is less anxious.  He does not seem as uncomfortable as he was yesterday.  Most recent CIWA has been 8.  The past 24 hours, patient has received 390 mg of phenobarbital.  Librium taper and Ativan has been discontinued.  On exam, patient is slightly tremulous.  Patient has been trying to go home, but we have had multiple conversations about the importance of staying, and patient is redirectable.  Do  have some concern that patient might leave AMA.  Continue on phenobarbital taper. -Continue phenobarbital taper -Continue to monitor CIWA scores -As needed Ativan, Atarax given for anxiety -Continue to monitor vital signs -Continuous O2 monitoring  #Tobacco use disorder Patient requesting nicotine gum today.  Of note patient reports allergy to nicotine patch. -Offer nicotine gum   #PTSD No acute concerns during hospitalization.  Diet: Normal IVF: None,None VTE: Enoxaparin Code: Full PT/OT recs: None, none.  Dispo: Anticipated discharge to Home in 3 days pending clinical improvement.   Modena Slater DO Internal Medicine Resident PGY-1 [phone] Please contact the on call pager after 5 pm and on weekends at [phone].
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
## 2023-03-26 NOTE — Progress Notes (Signed)
NAME:  [name], MRN:  [id number], DOB:  07-08-1971, LOS: 2 ADMISSION DATE:  03/24/2023, CONSULTATION DATE:  6/19 REFERRING MD:  Lafonda Mosses, CHIEF COMPLAINT:  alcohol wd  History of Present Illness:  63 yom w/ heavy ETOH hx. Had been sober up to about 6 d prior to admit at which time he began to drink appox 18 beers/d. Woke up 6/18 feeling tremulous so presented to ER w/ request to get assist w/ Detox. He was BIB [facility].  Was started on CIWA protocol w/ librium taper  Critical care was asked to see in consult as pt continues to be tremulous and exhibiting intermittent hallucinations in spite of 5 mg total ativan administered from 0828 to 1233 in addition to scheduled Librium, specifically w/ concerns as to if he would need higher level of care or change in ETOH w/x treatment strategy  Pertinent  Medical History  Etoh use disorder, PTSD, tobacco abuse, prior wd seizures and prior DTs  Significant Hospital Events: Including procedures, antibiotic start and stop dates in addition to other pertinent events   6/18 admitted for ETOH detox. Already tremulous and diaphoretic started on Librium and CIWA protocol  6/19 Critical care consulted. Symptoms persist in spite of Benzos. Librium and CIWA protocol stopped. Phenobarb initiated.   Interim History / Subjective:   No problems reported overnight Phenobarbital taper enacted Oriented to self, place, situation.  States that he feels less anxious, has much less tremor  Objective   Blood pressure 114/67, pulse 66, temperature 97.7 F (36.5 C), temperature source Axillary, resp. rate 18, weight 81.5 kg, SpO2 95 %.        Intake/Output Summary (Last 24 hours) at 03/26/2023 4098 Last data filed at 03/26/2023 0630 Gross per 24 hour  Intake 1200 ml  Output --  Net 1200 ml   Filed Weights   03/25/23 0500  Weight: 81.5 kg    Examination: General: Comfortable thin gentleman laying in bed in no distress HENT: Oropharynx clear, strong voice,  no stridor Lungs: Clear bilaterally Cardiovascular: Regular, no murmur Abdomen: Nondistended with positive bowel sounds Extremities: No edema Neuro: Awake, oriented, no perceivable tremor, denies any hallucinations  Resolved Hospital Problem list     Assessment & Plan:  Acute ETOH withdrawal with Delirium tremens, inadequately controlled with Librium and benzodiazepines Plan -Loaded phenobarbital on 6/19 -Good response, plan to continue phenobarbital taper -Okay to add back Ativan as needed once phenobarbital is off if necessary but suspect it will not be required. -Watch for any evidence of oversedation -Multivitamin, folate, thiamine -Would benefit from substance abuse counseling  PCCM will sign off.  Please call if we can assist in any way.  Best Practice (right click and "Reselect all SmartList Selections" daily)   Diet/type: Regular consistency (see orders) DVT prophylaxis: LMWH GI prophylaxis: N/A Lines: N/A Foley:  N/A Code Status:  full code Last date of multidisciplinary goals of care discussion [pending]  Labs   CBC: Recent Labs  Lab 03/24/23 0836 03/25/23 0403  WBC 5.8 5.9  NEUTROABS 3.3  --   HGB 14.4 13.5  HCT 43.7 40.1  MCV 81.2 82.2  PLT 371 320    Basic Metabolic Panel: Recent Labs  Lab 03/24/23 0836 03/24/23 1350 03/25/23 0403 03/26/23 0426  NA 144  --  140 137  K 3.8  --  3.5 3.4*  CL 108  --  104 101  CO2 23  --  27 28  GLUCOSE 87  --  77 152*  BUN  9  --  11 5*  CREATININE 0.72  --  0.77 0.80  CALCIUM 8.4*  --  8.8* 8.9  MG 2.1  --   --  2.0  PHOS  --  3.2 3.8  --    GFR: Estimated Creatinine Clearance: 112.8 mL/min (by C-G formula based on SCr of 0.8 mg/dL). Recent Labs  Lab 03/24/23 0836 03/25/23 0403  WBC 5.8 5.9    Liver Function Tests: Recent Labs  Lab 03/24/23 0836 03/25/23 0403  AST 32 26  ALT 23 20  ALKPHOS 53 43  BILITOT 0.5 1.0  PROT 7.2 5.8*  ALBUMIN 4.1 3.3*    Critical care time: NA     Levy Pupa, MD, PhD 03/26/2023, 8:19 AM [facility] [phone] or if no answer before 7:00PM call [phone] For any issues after 7:00PM please call eLink [phone]
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
## 2023-03-27 ENCOUNTER — Telehealth: Payer: Self-pay | Admitting: Student

## 2023-03-27 NOTE — Telephone Encounter (Signed)
Tried to call the patient two times today to follow up after the patient left AMA from hospital yesterday. First time it went to voicemail. The next time someone answered and said that I had the wrong number. Was not able to reach the patient to check on him.

## 2023-03-29 NOTE — Discharge Summary (Signed)
Name: [name] MRN: [id number] DOB: July 13, 1971 52 y.o. [facility]: [facility]  Date of Admission: 03/24/2023  8:09 AM Date of Discharge: 03/29/2023 Attending Physician: Dr. Mercie Eon  Discharge Diagnosis: Principal Problem:   Alcohol withdrawal (HCC) Active Problems:   PTSD (post-traumatic stress disorder)   Alcohol use disorder, severe, in controlled environment [facility])   DTs (delirium tremens) (HCC)    Discharge Medications: Allergies as of 03/26/2023       Reactions   Effexor [venlafaxine] Hives   Nicoderm [nicotine] Rash   Paxil [paroxetine Hcl] Hives, Rash        Medication List     ASK your doctor about these medications    gabapentin 300 MG capsule Commonly known as: NEURONTIN Take 300 mg by mouth 3 (three) times daily as needed (alcohol craving).   gabapentin 100 MG capsule Commonly known as: NEURONTIN Take 2 capsules (200 mg total) by mouth 2 (two) times daily.   nicotine polacrilex 4 MG gum Commonly known as: NICORETTE Take 4 mg by mouth as needed for smoking cessation. Not to exceed 12 chews in 24 hours. Ask about: Which instructions should I use?        Disposition and follow-up:   [name] was discharged from [facility] in Stable condition.  At the hospital follow up visit please address:  1.  Follow-up:  A.  Alcohol use disorder: Ensure patient refrains from alcohol use. Discuss acomprosate or naltrexone with the patient on follow up as not able to do so as patient left AMA.   B.  PTSD: Patient has a history of PTSD. No acute concerns during the hospital stay. Follow up outpatient.   2.  Labs / imaging needed at time of follow-up: N/A  3.  Pending labs/ test needing follow-up: N/A  4.  Medication Changes  No medication changes   Follow-up Appointments: N/A  Hospital Course by problem list: [name] is a 52 y.o. male with a past medical history of alcohol use disorder, PTSD,  tobacco use disorder who presents to the emergency department with concerns of alcohol withdrawals.  With patient having history of prior alcohol withdrawal seizures and DTs, patient admitted for further evaluation and management of alcohol withdrawal.   #Alcohol use disorder #Alcohol withdrawal Patient was admitted to the impatient service for concerns of alcohol withdrawal symptoms.  He states he was drinking 18 beers per day prior to being admitted.  Patient was admitted with CIWA with Ativan initially.  Was started on Librium taper.  Patient was still having severe withdrawal symptoms, and started on phenobarbital.  Patient was followed by critical care medicine while on the floor on phenobarbital.  He seemed to improve well with phenobarbital.  On day 3 of hospitalization, he decided to leave AMA.  After multiple attempts to keep him, he stated that he understands the risks of leaving including seizures, death, hypertension.  He reports that he excepted these risks and is ready to go.  Patient signed AMA papers and left AMA.  Patient was encouraged to not drink alcohol on discharge as mixing phenobarbital and alcohol can cause risk of respiratory depression.   #Tobacco use disorder No acute concerns during hospitalization. Offered nicotine gum and patch while patient was inpatient. Patient left AMA.    #PTSD Patient has a history of PTSD. No acute concerns during hospital stay. Patient left AMA.   Discharge Subjective:  Patient stated that he is not wanting to stay anymore.  He reports that he is no longer having any other withdrawal symptoms.  Had a long lengthy discussion with patient about the importance of staying.  I discussed the risks and benefits of staying versus leaving.  He accepts these risk, including seizures, worsening tachycardia, worsening hypertension, or even death.  He accepts these risks and decided to leave AMA.  Discharge Exam:   BP 105/79 (BP Location: Left Arm)   Pulse  77   Temp 97.7 F (36.5 C) (Oral)   Resp 18   Wt 81.5 kg   SpO2 97%   BMI 25.78 kg/m  Constitutional: well-appearing, resting in bed no acute distress  HENT: normocephalic atraumatic, mucous membranes moist Cardiovascular: regular rate and rhythm, no m/r/g Pulmonary/Chest: normal work of breathing on room air, lungs clear to auscultation bilaterally Abdominal: soft, non-tender, non-distended Neurological: AxO x3  Psych: Anxious   Pertinent Labs, Studies, and Procedures:     Latest Ref Rng & Units 03/25/2023    4:03 AM 03/24/2023    8:36 AM 03/10/2023    1:49 PM  CBC  WBC 4.0 - 10.5 K/uL 5.9  5.8  8.1   Hemoglobin 13.0 - 17.0 g/dL 16.1  09.6  04.5   Hematocrit 39.0 - 52.0 % 40.1  43.7  45.2   Platelets 150 - 400 K/uL 320  371  181        Latest Ref Rng & Units 03/26/2023    4:26 AM 03/25/2023    4:03 AM 03/24/2023    8:36 AM  CMP  Glucose 70 - 99 mg/dL 409  77  87   BUN 6 - 20 mg/dL 5  11  9    Creatinine 0.61 - 1.24 mg/dL 8.11  9.14  7.82   Sodium 135 - 145 mmol/L 137  140  144   Potassium 3.5 - 5.1 mmol/L 3.4  3.5  3.8   Chloride 98 - 111 mmol/L 101  104  108   CO2 22 - 32 mmol/L 28  27  23    Calcium 8.9 - 10.3 mg/dL 8.9  8.8  8.4   Total Protein 6.5 - 8.1 g/dL  5.8  7.2   Total Bilirubin 0.3 - 1.2 mg/dL  1.0  0.5   Alkaline Phos 38 - 126 U/L  43  53   AST 15 - 41 U/L  26  32   ALT 0 - 44 U/L  20  23     No results found.   Discharge Instructions:   Signed: Modena Slater, DO 03/29/2023, 3:41 PM   Pager: [phone]

## 2023-06-09 ENCOUNTER — Inpatient Hospital Stay (HOSPITAL_COMMUNITY)
Admission: EM | Admit: 2023-06-09 | Discharge: 2023-06-16 | DRG: 897 | Disposition: A | Discharge status: Home / Self Care | Payer: No Typology Code available for payment source | Attending: Internal Medicine | Admitting: Internal Medicine

## 2023-06-09 ENCOUNTER — Other Ambulatory Visit: Payer: Self-pay

## 2023-06-09 ENCOUNTER — Encounter (HOSPITAL_COMMUNITY): Payer: Self-pay

## 2023-06-09 DIAGNOSIS — Z811 Family history of alcohol abuse and dependence: Secondary | ICD-10-CM | POA: Diagnosis not present

## 2023-06-09 DIAGNOSIS — F10931 Alcohol use, unspecified with withdrawal delirium: Secondary | ICD-10-CM | POA: Diagnosis not present

## 2023-06-09 DIAGNOSIS — E8809 Other disorders of plasma-protein metabolism, not elsewhere classified: Secondary | ICD-10-CM | POA: Diagnosis present

## 2023-06-09 DIAGNOSIS — F1010 Alcohol abuse, uncomplicated: Principal | ICD-10-CM

## 2023-06-09 DIAGNOSIS — Z79899 Other long term (current) drug therapy: Secondary | ICD-10-CM | POA: Diagnosis not present

## 2023-06-09 DIAGNOSIS — Z635 Disruption of family by separation and divorce: Secondary | ICD-10-CM | POA: Diagnosis not present

## 2023-06-09 DIAGNOSIS — F17210 Nicotine dependence, cigarettes, uncomplicated: Secondary | ICD-10-CM | POA: Diagnosis present

## 2023-06-09 DIAGNOSIS — R45851 Suicidal ideations: Secondary | ICD-10-CM | POA: Diagnosis not present

## 2023-06-09 DIAGNOSIS — E876 Hypokalemia: Secondary | ICD-10-CM | POA: Diagnosis present

## 2023-06-09 DIAGNOSIS — Y908 Blood alcohol level of 240 mg/100 ml or more: Secondary | ICD-10-CM | POA: Diagnosis present

## 2023-06-09 DIAGNOSIS — F10221 Alcohol dependence with intoxication delirium: Secondary | ICD-10-CM | POA: Diagnosis present

## 2023-06-09 DIAGNOSIS — F419 Anxiety disorder, unspecified: Secondary | ICD-10-CM | POA: Diagnosis not present

## 2023-06-09 DIAGNOSIS — R7401 Elevation of levels of liver transaminase levels: Secondary | ICD-10-CM | POA: Diagnosis present

## 2023-06-09 DIAGNOSIS — F1094 Alcohol use, unspecified with alcohol-induced mood disorder: Secondary | ICD-10-CM | POA: Diagnosis not present

## 2023-06-09 DIAGNOSIS — F10239 Alcohol dependence with withdrawal, unspecified: Secondary | ICD-10-CM | POA: Diagnosis not present

## 2023-06-09 DIAGNOSIS — F4310 Post-traumatic stress disorder, unspecified: Secondary | ICD-10-CM | POA: Diagnosis present

## 2023-06-09 DIAGNOSIS — F3181 Bipolar II disorder: Secondary | ICD-10-CM | POA: Diagnosis present

## 2023-06-09 DIAGNOSIS — F32A Depression, unspecified: Secondary | ICD-10-CM | POA: Diagnosis not present

## 2023-06-09 DIAGNOSIS — Z888 Allergy status to other drugs, medicaments and biological substances status: Secondary | ICD-10-CM

## 2023-06-09 DIAGNOSIS — R17 Unspecified jaundice: Secondary | ICD-10-CM | POA: Diagnosis present

## 2023-06-09 DIAGNOSIS — F40240 Claustrophobia: Secondary | ICD-10-CM | POA: Diagnosis present

## 2023-06-09 DIAGNOSIS — F10939 Alcohol use, unspecified with withdrawal, unspecified: Secondary | ICD-10-CM | POA: Diagnosis present

## 2023-06-09 DIAGNOSIS — F10229 Alcohol dependence with intoxication, unspecified: Secondary | ICD-10-CM | POA: Diagnosis present

## 2023-06-09 DIAGNOSIS — F1014 Alcohol abuse with alcohol-induced mood disorder: Secondary | ICD-10-CM | POA: Diagnosis not present

## 2023-06-09 DIAGNOSIS — R451 Restlessness and agitation: Secondary | ICD-10-CM | POA: Diagnosis not present

## 2023-06-09 LAB — COMPREHENSIVE METABOLIC PANEL
ALT: 38 U/L (ref 0–44)
AST: 44 U/L — ABNORMAL HIGH (ref 15–41)
Albumin: 4.4 g/dL (ref 3.5–5.0)
Alkaline Phosphatase: 60 U/L (ref 38–126)
Anion gap: 14 (ref 5–15)
BUN: 8 mg/dL (ref 6–20)
CO2: 25 mmol/L (ref 22–32)
Calcium: 8.5 mg/dL — ABNORMAL LOW (ref 8.9–10.3)
Chloride: 101 mmol/L (ref 98–111)
Creatinine, Ser: 0.75 mg/dL (ref 0.61–1.24)
GFR, Estimated: >60 mL/min (ref >60)
Glucose, Bld: 103 mg/dL — ABNORMAL HIGH (ref 70–99)
Potassium: 3.4 mmol/L — ABNORMAL LOW (ref 3.5–5.1)
Sodium: 140 mmol/L (ref 135–145)
Total Bilirubin: 0.7 mg/dL (ref 0.3–1.2)
Total Protein: 8.4 g/dL — ABNORMAL HIGH (ref 6.5–8.1)

## 2023-06-09 LAB — CBC WITH DIFFERENTIAL/PLATELET
Abs Immature Granulocytes: 0.02 10*3/uL (ref 0.00–0.07)
Basophils Absolute: 0 10*3/uL (ref 0.0–0.1)
Basophils Relative: 1 %
Eosinophils Absolute: 0.1 10*3/uL (ref 0.0–0.5)
Eosinophils Relative: 1 %
HCT: 48.8 % (ref 39.0–52.0)
Hemoglobin: 16.4 g/dL (ref 13.0–17.0)
Immature Granulocytes: 0 %
Lymphocytes Relative: 60 %
Lymphs Abs: 4.5 10*3/uL — ABNORMAL HIGH (ref 0.7–4.0)
MCH: 27 pg (ref 26.0–34.0)
MCHC: 33.6 g/dL (ref 30.0–36.0)
MCV: 80.4 fL (ref 80.0–100.0)
Monocytes Absolute: 0.4 10*3/uL (ref 0.1–1.0)
Monocytes Relative: 5 %
Neutro Abs: 2.5 10*3/uL (ref 1.7–7.7)
Neutrophils Relative %: 33 %
Platelets: 236 10*3/uL (ref 150–400)
RBC: 6.07 MIL/uL — ABNORMAL HIGH (ref 4.22–5.81)
RDW: 14.3 % (ref 11.5–15.5)
WBC: 7.4 10*3/uL (ref 4.0–10.5)
nRBC: 0 % (ref 0.0–0.2)

## 2023-06-09 LAB — RAPID URINE DRUG SCREEN, HOSP PERFORMED
Amphetamines: NOT DETECTED
Barbiturates: NOT DETECTED
Benzodiazepines: NOT DETECTED
Cocaine: NOT DETECTED
Opiates: NOT DETECTED
Tetrahydrocannabinol: POSITIVE — AB

## 2023-06-09 LAB — MRSA NEXT GEN BY PCR, NASAL: MRSA by PCR Next Gen: NOT DETECTED

## 2023-06-09 LAB — ETHANOL: Alcohol, Ethyl (B): 383 mg/dL (ref <10)

## 2023-06-09 MED ORDER — HALOPERIDOL LACTATE 5 MG/ML IJ SOLN
2.0000 mg | Freq: Once | INTRAMUSCULAR | Status: AC
  Administered 2023-06-09: 2 mg via INTRAVENOUS
  Filled 2023-06-09: qty 1

## 2023-06-09 MED ORDER — LORAZEPAM 1 MG PO TABS: 0.0000 mg | ORAL_TABLET | Freq: Four times a day (QID) | ORAL | Status: DC

## 2023-06-09 MED ORDER — ENOXAPARIN SODIUM 40 MG/0.4ML IJ SOSY
40.0000 mg | PREFILLED_SYRINGE | INTRAMUSCULAR | Status: DC
  Administered 2023-06-09 – 2023-06-12 (×4): 40 mg via SUBCUTANEOUS
  Filled 2023-06-09 (×7): qty 0.4

## 2023-06-09 MED ORDER — LORAZEPAM 2 MG/ML IJ SOLN
0.0000 mg | INTRAMUSCULAR | Status: DC
  Administered 2023-06-09: 2 mg via INTRAVENOUS
  Administered 2023-06-10 (×2): 3 mg via INTRAVENOUS
  Administered 2023-06-10: 2 mg via INTRAVENOUS
  Filled 2023-06-09: qty 1
  Filled 2023-06-09: qty 2
  Filled 2023-06-09 (×2): qty 1

## 2023-06-09 MED ORDER — CHLORHEXIDINE GLUCONATE CLOTH 2 % EX PADS
6.0000 | MEDICATED_PAD | Freq: Every day | CUTANEOUS | Status: DC
  Administered 2023-06-09 – 2023-06-16 (×5): 6 via TOPICAL

## 2023-06-09 MED ORDER — SENNOSIDES-DOCUSATE SODIUM 8.6-50 MG PO TABS: 1.0000 | ORAL_TABLET | Freq: Every evening | ORAL | Status: DC | PRN

## 2023-06-09 MED ORDER — FOLIC ACID 1 MG PO TABS
1.0000 mg | ORAL_TABLET | Freq: Every day | ORAL | Status: DC
  Administered 2023-06-09 – 2023-06-16 (×8): 1 mg via ORAL
  Filled 2023-06-09 (×8): qty 1

## 2023-06-09 MED ORDER — THIAMINE MONONITRATE 100 MG PO TABS
100.0000 mg | ORAL_TABLET | Freq: Every day | ORAL | Status: DC
  Administered 2023-06-09 – 2023-06-10 (×2): 100 mg via ORAL
  Filled 2023-06-09 (×2): qty 1

## 2023-06-09 MED ORDER — LORAZEPAM 2 MG/ML IJ SOLN: 0.0000 mg | Freq: Two times a day (BID) | INTRAMUSCULAR | Status: DC

## 2023-06-09 MED ORDER — ACETAMINOPHEN 650 MG RE SUPP: 650.0000 mg | Freq: Four times a day (QID) | RECTAL | Status: DC | PRN

## 2023-06-09 MED ORDER — PROCHLORPERAZINE EDISYLATE 10 MG/2ML IJ SOLN: 5.0000 mg | INTRAMUSCULAR | Status: DC | PRN

## 2023-06-09 MED ORDER — LORAZEPAM 2 MG/ML IJ SOLN
2.0000 mg | Freq: Once | INTRAMUSCULAR | Status: AC
  Administered 2023-06-09: 2 mg via INTRAVENOUS
  Filled 2023-06-09: qty 1

## 2023-06-09 MED ORDER — LORAZEPAM 2 MG/ML IJ SOLN
1.0000 mg | INTRAMUSCULAR | Status: AC | PRN
  Administered 2023-06-10: 4 mg via INTRAVENOUS
  Administered 2023-06-10: 2 mg via INTRAVENOUS
  Administered 2023-06-10: 3 mg via INTRAVENOUS
  Administered 2023-06-10: 2 mg via INTRAVENOUS
  Administered 2023-06-10: 3 mg via INTRAVENOUS
  Administered 2023-06-11 – 2023-06-12 (×5): 2 mg via INTRAVENOUS
  Administered 2023-06-12: 4 mg via INTRAVENOUS
  Filled 2023-06-09: qty 1
  Filled 2023-06-09 (×2): qty 2
  Filled 2023-06-09 (×3): qty 1
  Filled 2023-06-09: qty 2
  Filled 2023-06-09: qty 1
  Filled 2023-06-09: qty 2
  Filled 2023-06-09 (×5): qty 1

## 2023-06-09 MED ORDER — LORAZEPAM 2 MG/ML IJ SOLN: 0.0000 mg | Freq: Three times a day (TID) | INTRAMUSCULAR | Status: DC

## 2023-06-09 MED ORDER — POTASSIUM CHLORIDE IN NACL 20-0.9 MEQ/L-% IV SOLN
INTRAVENOUS | Status: AC
  Filled 2023-06-09 (×4): qty 1000

## 2023-06-09 MED ORDER — LACTATED RINGERS IV SOLN: INTRAVENOUS | Status: DC

## 2023-06-09 MED ORDER — SODIUM CHLORIDE 0.9% FLUSH
3.0000 mL | Freq: Two times a day (BID) | INTRAVENOUS | Status: DC
  Administered 2023-06-09 – 2023-06-16 (×14): 3 mL via INTRAVENOUS

## 2023-06-09 MED ORDER — ADULT MULTIVITAMIN W/MINERALS CH
1.0000 | ORAL_TABLET | Freq: Every day | ORAL | Status: DC
  Administered 2023-06-09 – 2023-06-16 (×7): 1 via ORAL
  Filled 2023-06-09 (×8): qty 1

## 2023-06-09 MED ORDER — LORAZEPAM 1 MG PO TABS: 0.0000 mg | ORAL_TABLET | Freq: Two times a day (BID) | ORAL | Status: DC

## 2023-06-09 MED ORDER — LORAZEPAM 1 MG PO TABS
1.0000 mg | ORAL_TABLET | ORAL | Status: AC | PRN
  Administered 2023-06-11: 2 mg via ORAL
  Filled 2023-06-09: qty 2

## 2023-06-09 MED ORDER — THIAMINE HCL 100 MG/ML IJ SOLN: 100.0000 mg | Freq: Every day | INTRAMUSCULAR | Status: DC

## 2023-06-09 MED ORDER — ACETAMINOPHEN 325 MG PO TABS: 650.0000 mg | ORAL_TABLET | Freq: Four times a day (QID) | ORAL | Status: DC | PRN

## 2023-06-09 MED ORDER — LORAZEPAM 2 MG/ML IJ SOLN: 0.0000 mg | Freq: Four times a day (QID) | INTRAMUSCULAR | Status: DC

## 2023-06-09 MED ORDER — LACTATED RINGERS IV BOLUS
1000.0000 mL | Freq: Once | INTRAVENOUS | Status: AC
  Administered 2023-06-09: 1000 mL via INTRAVENOUS

## 2023-06-09 NOTE — ED Notes (Signed)
Pt has been changed into burgundy scrubs.   

## 2023-06-09 NOTE — ED Triage Notes (Signed)
Pt arrived with girlfriend who reports patient has been drinking again for 5 days 35 bears a day. Patient very paranoid, PTSD. Patient follows commands, redirected. Taken back to 22

## 2023-06-09 NOTE — H&P (Signed)
History and Physical    [name] HYQ:[id number] DOB: [birth year] DOA: 06/09/2023  [facility]P: [facility]   Patient coming from: Home   Chief Complaint: Alcohol problem, SI   HPI: [name] is a 52 y.o. male with medical history significant for anxiety, depression, PTSD, and alcohol abuse who presents to the emergency department seeking help for an alcohol problem.  Patient has been consuming roughly 35 beers daily, has been expressing suicidal ideations, and is brought in by his significant other. He confirms feeling suicidal but is unable to elaborate.   ED Course: Upon arrival to the ED, patient is found to be afebrile and saturating low 90s on room air with slightly elevated heart rate and stable blood pressure.  Labs are most notable for ethanol level 383.  Patient was given a liter of LR, 2 mg IV Haldol, 2 mg IV Ativan, and thiamine in the ED.  Review of Systems:  ROS limited by patient's clinical condition.  Past Medical History:  Diagnosis Date   Active smoker    Alcohol abuse    Anxiety    Bipolar 2 disorder (HCC)    Depression    PTSD (post-traumatic stress disorder)     Past Surgical History:  Procedure Laterality Date   LYMPH NODE DISSECTION      Social History:   reports that he has been smoking cigarettes. He has a 40 pack-year smoking history. He has never used smokeless tobacco. He reports current alcohol use. He reports current drug use. Drug: Marijuana.  Allergies  Allergen Reactions   Effexor [Venlafaxine] Hives   Nicoderm [Nicotine] Rash   Paxil [Paroxetine Hcl] Hives and Rash    Family History  Problem Relation Age of Onset   Alcohol abuse Father      Prior to Admission medications   Medication Sig Start Date End Date Taking? Authorizing Provider  gabapentin (NEURONTIN) 100 MG capsule Take 2 capsules (200 mg total) by mouth 2 (two) times daily. Patient not taking: Reported on 03/26/2023 11/07/18   Laveda Abbe, NP   gabapentin (NEURONTIN) 300 MG capsule Take 300 mg by mouth 3 (three) times daily as needed (alcohol craving).    [provider]  nicotine polacrilex (NICORETTE) 4 MG gum Take 4 mg by mouth as needed for smoking cessation. Not to exceed 12 chews in 24 hours.    [provider]    Physical Exam: Vitals:   06/09/23 1903 06/09/23 1930 06/09/23 2000 06/09/23 2004  BP: (!) 129/92 122/78 122/83 122/83  Pulse: 97 94 93 93  Resp:  18 16   Temp:      SpO2:  93% 93%   Weight:      Height:        Constitutional: NAD, no pallor or diaphoresis   Eyes: PERTLA, lids and conjunctivae normal ENMT: Mucous membranes are moist. Posterior pharynx clear of any exudate or lesions.   Neck: supple, no masses  Respiratory: no wheezing, no crackles. No accessory muscle use.  Cardiovascular: S1 & S2 heard, regular rate and rhythm. No extremity edema.   Abdomen: No distension, no tenderness, soft. Bowel sounds active.  Musculoskeletal: no clubbing / cyanosis. No joint deformity upper and lower extremities.   Skin: no significant rashes, lesions, ulcers. Warm, dry, well-perfused. Neurologic: CN 2-12 grossly intact. Moving all extremities. Opens eyes to voice. Disoriented.    Labs and Imaging on Admission: I have personally reviewed following labs and imaging studies  CBC: Recent Labs  Lab  06/09/23 1825  WBC 7.4  NEUTROABS 2.5  HGB 16.4  HCT 48.8  MCV 80.4  PLT 236   Basic Metabolic Panel: Recent Labs  Lab 06/09/23 1825  NA 140  K 3.4*  CL 101  CO2 25  GLUCOSE 103*  BUN 8  CREATININE 0.75  CALCIUM 8.5*   GFR: Estimated Creatinine Clearance: 112.8 mL/min (by C-G formula based on SCr of 0.75 mg/dL). Liver Function Tests: Recent Labs  Lab 06/09/23 1825  AST 44*  ALT 38  ALKPHOS 60  BILITOT 0.7  PROT 8.4*  ALBUMIN 4.4   No results for input(s): "LIPASE", "AMYLASE" in the last 168 hours. No results for input(s): "AMMONIA" in the last 168 hours. Coagulation  Profile: No results for input(s): "INR", "PROTIME" in the last 168 hours. Cardiac Enzymes: No results for input(s): "CKTOTAL", "CKMB", "CKMBINDEX", "TROPONINI" in the last 168 hours. BNP (last 3 results) No results for input(s): "PROBNP" in the last 8760 hours. HbA1C: No results for input(s): "HGBA1C" in the last 72 hours. CBG: No results for input(s): "GLUCAP" in the last 168 hours. Lipid Profile: No results for input(s): "CHOL", "HDL", "LDLCALC", "TRIG", "CHOLHDL", "LDLDIRECT" in the last 72 hours. Thyroid Function Tests: No results for input(s): "TSH", "T4TOTAL", "FREET4", "T3FREE", "THYROIDAB" in the last 72 hours. Anemia Panel: No results for input(s): "VITAMINB12", "FOLATE", "FERRITIN", "TIBC", "IRON", "RETICCTPCT" in the last 72 hours. Urine analysis:    Component Value Date/Time   COLORURINE STRAW (A) 03/24/2023 0836   APPEARANCEUR CLEAR 03/24/2023 0836   LABSPEC 1.004 (L) 03/24/2023 0836   PHURINE 5.0 03/24/2023 0836   GLUCOSEU NEGATIVE 03/24/2023 0836   HGBUR NEGATIVE 03/24/2023 0836   BILIRUBINUR NEGATIVE 03/24/2023 0836   KETONESUR NEGATIVE 03/24/2023 0836   PROTEINUR NEGATIVE 03/24/2023 0836   NITRITE NEGATIVE 03/24/2023 0836   LEUKOCYTESUR NEGATIVE 03/24/2023 0836   Sepsis Labs: @LABRCNTIP (procalcitonin:4,lacticidven:4) )No results found for this or any previous visit (from the past 240 hour(s)).   Radiological Exams on Admission: No results found.   Assessment/Plan  1. Alcohol dependence with withdrawal  - Continue CIWA scoring, Ativan, and vitamins    2. Suicidal ideation  - Pt confirms SI but is unable to contribute much else to the history in ED  - Suicide precautions, consult psychiatry once he is able to participate in interview   3. Hypokalemia  - KCl added to IVF, will repeat chem panel in am     DVT prophylaxis: Lovenox  Code Status: Full  Level of Care: Level of care: Stepdown Family Communication: Significant other updated from ED    Disposition Plan:  Patient is from: home  Anticipated d/c is to: TBD Anticipated d/c date is: 06/12/23  Patient currently: Pending treatment of alcohol withdrawal, psychiatry assessment  Consults called: None  Admission status: Inpatient     Briscoe Deutscher, MD [facility]  06/09/2023, 8:12 PM

## 2023-06-09 NOTE — ED Notes (Signed)
ED TO INPATIENT HANDOFF REPORT  ED Nurse Name and Phone #: Durene Cal RN   S Name/Age/Gender [name] 52 y.o. male Room/Bed: WA22/WA22  Code Status   Code Status: Full Code  Home/SNF/Other Home Patient oriented to: self, place, and situation Is this baseline?  Disoriented to time  Triage Complete: Triage complete  Chief Complaint Alcohol withdrawal [facility]) [F10.939]  Triage Note Pt arrived with girlfriend who reports patient has been drinking again for 5 days 35 bears a day. Patient very paranoid, PTSD. Patient follows commands, redirected. Taken back to 22   Allergies Allergies  Allergen Reactions   Effexor [Venlafaxine] Hives   Nicoderm [Nicotine] Rash   Paxil [Paroxetine Hcl] Hives and Rash    Level of Care/Admitting Diagnosis ED Disposition     ED Disposition  Admit   Condition  --   Comment  Hospital Area: [facility] [[id number]]  Level of Care: Stepdown [14]  Admit to SDU based on following criteria: Severe physiological/psychological symptoms:  Any diagnosis requiring assessment & intervention at least every 4 hours on an ongoing basis to obtain desired patient outcomes including stability and rehabilitation  May admit patient to [facility] or [facility] if equivalent level of care is available:: Yes  Covid Evaluation: Asymptomatic - no recent exposure (last 10 days) testing not required  Diagnosis: Alcohol withdrawal (HCC) [291.81.ICD-9-CM]  Admitting Physician: Briscoe Deutscher [[id number]]  Attending Physician: Briscoe Deutscher [[id number]]  Certification:: I certify this patient will need inpatient services for at least 2 midnights  Expected Medical Readiness: 06/12/2023          B Medical/Surgery History Past Medical History:  Diagnosis Date   Active smoker    Alcohol abuse    Anxiety    Bipolar 2 disorder (HCC)    Depression    PTSD (post-traumatic stress disorder)    Past Surgical History:  Procedure Laterality Date    LYMPH NODE DISSECTION       A IV Location/Drains/Wounds Patient Lines/Drains/Airways Status     Active Line/Drains/Airways     Name Placement date Placement time Site Days   Peripheral IV 06/09/23 20 G Anterior;Left Hand 06/09/23  1848  Hand  less than 1            Intake/Output Last 24 hours No intake or output data in the 24 hours ending 06/09/23 2032  Labs/Imaging Results for orders placed or performed during the hospital encounter of 06/09/23 (from the past 48 hour(s))  Rapid urine drug screen (hospital performed)     Status: Abnormal   Collection Time: 06/09/23  6:08 PM  Result Value Ref Range   Opiates NONE DETECTED NONE DETECTED   Cocaine NONE DETECTED NONE DETECTED   Benzodiazepines NONE DETECTED NONE DETECTED   Amphetamines NONE DETECTED NONE DETECTED   Tetrahydrocannabinol POSITIVE (A) NONE DETECTED   Barbiturates NONE DETECTED NONE DETECTED    Comment: (NOTE) DRUG SCREEN FOR MEDICAL PURPOSES ONLY.  IF CONFIRMATION IS NEEDED FOR ANY PURPOSE, NOTIFY LAB WITHIN 5 DAYS.  LOWEST DETECTABLE LIMITS FOR URINE DRUG SCREEN Drug Class                     Cutoff (ng/mL) Amphetamine and metabolites    1000 Barbiturate and metabolites    200 Benzodiazepine                 200 Opiates and metabolites        300 Cocaine and metabolites  300 THC                            50 Performed at [facility], [address] [place], [place] [address]   Ethanol     Status: Abnormal   Collection Time: 06/09/23  6:25 PM  Result Value Ref Range   Alcohol, Ethyl (B) 383 (HH) <10 mg/dL    Comment: CRITICAL RESULT CALLED TO, READ BACK BY AND VERIFIED WITH RN H Jancarlos Thrun AT 1939 06/09/23 CRUICKSHANK A (NOTE) Lowest detectable limit for serum alcohol is 10 mg/dL.  For medical purposes only. Performed at [facility] [address] [place], [place] [address]   Comprehensive metabolic panel     Status: Abnormal   Collection Time: 06/09/23   6:25 PM  Result Value Ref Range   Sodium 140 135 - 145 mmol/L   Potassium 3.4 (L) 3.5 - 5.1 mmol/L   Chloride 101 98 - 111 mmol/L   CO2 25 22 - 32 mmol/L   Glucose, Bld 103 (H) 70 - 99 mg/dL    Comment: Glucose reference range applies only to samples taken after fasting for at least 8 hours.   BUN 8 6 - 20 mg/dL   Creatinine, Ser 8.41 0.61 - 1.24 mg/dL   Calcium 8.5 (L) 8.9 - 10.3 mg/dL   Total Protein 8.4 (H) 6.5 - 8.1 g/dL   Albumin 4.4 3.5 - 5.0 g/dL   AST 44 (H) 15 - 41 U/L   ALT 38 0 - 44 U/L   Alkaline Phosphatase 60 38 - 126 U/L   Total Bilirubin 0.7 0.3 - 1.2 mg/dL   GFR, Estimated >32 >44 mL/min    Comment: (NOTE) Calculated using the CKD-EPI Creatinine Equation (2021)    Anion gap 14 5 - 15    Comment: Performed at [facility], [address], [place], [place] [address]  CBC with Differential/Platelet     Status: Abnormal   Collection Time: 06/09/23  6:25 PM  Result Value Ref Range   WBC 7.4 4.0 - 10.5 K/uL   RBC 6.07 (H) 4.22 - 5.81 MIL/uL   Hemoglobin 16.4 13.0 - 17.0 g/dL   HCT 25.3 66.4 - 40.3 %   MCV 80.4 80.0 - 100.0 fL   MCH 27.0 26.0 - 34.0 pg   MCHC 33.6 30.0 - 36.0 g/dL   RDW 47.4 25.9 - 56.3 %   Platelets 236 150 - 400 K/uL   nRBC 0.0 0.0 - 0.2 %   Neutrophils Relative % 33 %   Neutro Abs 2.5 1.7 - 7.7 K/uL   Lymphocytes Relative 60 %   Lymphs Abs 4.5 (H) 0.7 - 4.0 K/uL   Monocytes Relative 5 %   Monocytes Absolute 0.4 0.1 - 1.0 K/uL   Eosinophils Relative 1 %   Eosinophils Absolute 0.1 0.0 - 0.5 K/uL   Basophils Relative 1 %   Basophils Absolute 0.0 0.0 - 0.1 K/uL   Immature Granulocytes 0 %   Abs Immature Granulocytes 0.02 0.00 - 0.07 K/uL    Comment: Performed at [facility], [address] [place], [place] [address]   No results found.  Pending Labs Unresulted Labs (From admission, onward)     Start     Ordered   06/16/23 0500  Creatinine, serum  (enoxaparin (LOVENOX)    CrCl >/= 30 ml/min)   Weekly,   R     Comments:  while on enoxaparin therapy    06/09/23 2011   06/10/23 0500  Magnesium  Tomorrow morning,   R        06/09/23 2011   06/10/23 0500  Phosphorus  Tomorrow morning,   R        06/09/23 2011   06/10/23 0500  CBC  Daily,   R      06/09/23 2011   06/10/23 0500  Comprehensive metabolic panel  Daily,   R      06/09/23 2011            Vitals/Pain Today's Vitals   06/09/23 1930 06/09/23 2000 06/09/23 2004 06/09/23 2030  BP: 122/78 122/83 122/83 124/76  Pulse: 94 93 93 85  Resp: 18 16  16   Temp:      SpO2: 93% 93%  98%  Weight:      Height:      PainSc:        Isolation Precautions No active isolations  Medications Medications  thiamine (VITAMIN B1) tablet 100 mg (100 mg Oral Given 06/09/23 1847)    Or  thiamine (VITAMIN B1) injection 100 mg ( Intravenous See Alternative 06/09/23 1847)  LORazepam (ATIVAN) tablet 1-4 mg (has no administration in time range)    Or  LORazepam (ATIVAN) injection 1-4 mg (has no administration in time range)  folic acid (FOLVITE) tablet 1 mg (has no administration in time range)  multivitamin with minerals tablet 1 tablet (has no administration in time range)  0.9 % NaCl with KCl 20 mEq/ L  infusion (has no administration in time range)  LORazepam (ATIVAN) injection 0-4 mg (has no administration in time range)    Followed by  LORazepam (ATIVAN) injection 0-4 mg (has no administration in time range)  enoxaparin (LOVENOX) injection 40 mg (has no administration in time range)  sodium chloride flush (NS) 0.9 % injection 3 mL (has no administration in time range)  acetaminophen (TYLENOL) tablet 650 mg (has no administration in time range)    Or  acetaminophen (TYLENOL) suppository 650 mg (has no administration in time range)  senna-docusate (Senokot-S) tablet 1 tablet (has no administration in time range)  prochlorperazine (COMPAZINE) injection 5 mg (has no administration in time range)  haloperidol lactate (HALDOL) injection  2 mg (2 mg Intravenous Given 06/09/23 1857)  lactated ringers bolus 1,000 mL (1,000 mLs Intravenous New Bag/Given 06/09/23 2002)  LORazepam (ATIVAN) injection 2 mg (2 mg Intravenous Given 06/09/23 1920)    Mobility walks with person assist     Focused Assessments Cardiac Assessment Handoff:    Lab Results  Component Value Date   CKTOTAL 213 12/10/2017   TROPONINI <0.03 12/10/2017   No results found for: "DDIMER" Does the Patient currently have chest pain? No    R Recommendations: See Admitting Provider Note  Report given to:   Additional Notes: n/a

## 2023-06-09 NOTE — ED Provider Notes (Signed)
[place] EMERGENCY DEPARTMENT AT [facility] Provider Note   CSN: [id number] Arrival date & time: 06/09/23  1736     History  Chief Complaint  Patient presents with   Alcohol Intoxication   Paranoid    [name] is a 52 y.o. male.  52 year old male with history of alcohol abuse as well as PTSD presents with acute intoxication.  According to his girlfriend who is at bedside patient has been drinking for the last 5 days straight.  States that he drinks about 35 beers a day.  No reported history of illicit drug use.  Patient has not had any emesis.  Has expressed some suicidal ideations without plan.  Patient had recent hospitalization for similar symptoms and was placed on phenobarbital but left AMA.  Has had increased rest recently       Home Medications Prior to Admission medications   Medication Sig Start Date End Date Taking? Authorizing Provider  gabapentin (NEURONTIN) 100 MG capsule Take 2 capsules (200 mg total) by mouth 2 (two) times daily. Patient not taking: Reported on 03/26/2023 11/07/18   Laveda Abbe, NP  gabapentin (NEURONTIN) 300 MG capsule Take 300 mg by mouth 3 (three) times daily as needed (alcohol craving).    [provider]  nicotine polacrilex (NICORETTE) 4 MG gum Take 4 mg by mouth as needed for smoking cessation. Not to exceed 12 chews in 24 hours.    [provider]      Allergies    Effexor [venlafaxine], Nicoderm [nicotine], and Paxil [paroxetine hcl]    Review of Systems   Review of Systems  All other systems reviewed and are negative.   Physical Exam Updated Vital Signs Ht 1.778 m (5\' 10" )   Wt 81.5 kg   BMI 25.78 kg/m  Physical Exam Vitals and nursing note reviewed.  Constitutional:      General: He is not in acute distress.    Appearance: Normal appearance. He is well-developed. He is not toxic-appearing.  HENT:     Head: Normocephalic and atraumatic.  Eyes:     General: Lids are normal.      Conjunctiva/sclera: Conjunctivae normal.     Pupils: Pupils are equal, round, and reactive to light.  Neck:     Thyroid: No thyroid mass.     Trachea: No tracheal deviation.  Cardiovascular:     Rate and Rhythm: Normal rate and regular rhythm.     Heart sounds: Normal heart sounds. No murmur heard.    No gallop.  Pulmonary:     Effort: Pulmonary effort is normal. No respiratory distress.     Breath sounds: Normal breath sounds. No stridor. No decreased breath sounds, wheezing, rhonchi or rales.  Abdominal:     General: There is no distension.     Palpations: Abdomen is soft.     Tenderness: There is no abdominal tenderness. There is no rebound.  Musculoskeletal:        General: No tenderness. Normal range of motion.     Cervical back: Normal range of motion and neck supple.  Skin:    General: Skin is warm and dry.     Findings: No abrasion or rash.  Neurological:     Mental Status: He is oriented to person, place, and time. Mental status is at baseline. He is lethargic.     GCS: GCS eye subscore is 4. GCS verbal subscore is 5. GCS motor subscore is 6.     Cranial Nerves: Cranial  nerves are intact. No cranial nerve deficit.     Sensory: No sensory deficit.     Motor: Motor function is intact.     Gait: Gait normal.  Psychiatric:        Attention and Perception: Attention normal.        Mood and Affect: Affect is labile.        Speech: Speech is rapid and pressured.        Behavior: Behavior is aggressive.     ED Results / Procedures / Treatments   Labs (all labs ordered are listed, but only abnormal results are displayed) Labs Reviewed  RAPID URINE DRUG SCREEN, HOSP PERFORMED  ETHANOL  COMPREHENSIVE METABOLIC PANEL  CBC WITH DIFFERENTIAL/PLATELET    EKG None  Radiology No results found.  Procedures Procedures    Medications Ordered in ED Medications  lactated ringers infusion (has no administration in time range)  haloperidol lactate (HALDOL) injection  2 mg (has no administration in time range)  lactated ringers bolus 1,000 mL (has no administration in time range)  LORazepam (ATIVAN) injection 0-4 mg (has no administration in time range)    Or  LORazepam (ATIVAN) tablet 0-4 mg (has no administration in time range)  LORazepam (ATIVAN) injection 0-4 mg (has no administration in time range)    Or  LORazepam (ATIVAN) tablet 0-4 mg (has no administration in time range)  thiamine (VITAMIN B1) tablet 100 mg (has no administration in time range)    Or  thiamine (VITAMIN B1) injection 100 mg (has no administration in time range)    ED Course/ Medical Decision Making/ A&P                                 Medical Decision Making Amount and/or Complexity of Data Reviewed Labs: ordered.  Risk OTC drugs. Prescription drug management.   Patient with acute alcohol desiccation at this time.  Alcohol level of 383.  Despite this, patient CIWA is above 30.  Medicated with Haldol and Ativan.  Patient reassessed and remains tremulous.  Patient placed on CIWA protocol.  Patient will require admission.  Will consult hospitalist team  CRITICAL CARE Performed by: Toy Baker Total critical care time: 50 minutes Critical care time was exclusive of separately billable procedures and treating other patients. Critical care was necessary to treat or prevent imminent or life-threatening deterioration. Critical care was time spent personally by me on the following activities: development of treatment plan with patient and/or surrogate as well as nursing, discussions with consultants, evaluation of patient's response to treatment, examination of patient, obtaining history from patient or surrogate, ordering and performing treatments and interventions, ordering and review of laboratory studies, ordering and review of radiographic studies, pulse oximetry and re-evaluation of patient's condition.         Final Clinical Impression(s) / ED Diagnoses Final  diagnoses:  None    Rx / DC Orders ED Discharge Orders     None         Lorre Nick, MD 06/09/23 1951

## 2023-06-09 NOTE — ED Notes (Signed)
Pts significant other took pts personal belongings per pts wishes.

## 2023-06-10 DIAGNOSIS — R45851 Suicidal ideations: Secondary | ICD-10-CM

## 2023-06-10 DIAGNOSIS — F10931 Alcohol use, unspecified with withdrawal delirium: Secondary | ICD-10-CM

## 2023-06-10 DIAGNOSIS — E876 Hypokalemia: Secondary | ICD-10-CM | POA: Diagnosis not present

## 2023-06-10 DIAGNOSIS — F10139 Alcohol abuse with withdrawal, unspecified: Secondary | ICD-10-CM

## 2023-06-10 LAB — VITAMIN B12: Vitamin B-12: 204 pg/mL (ref 180–914)

## 2023-06-10 LAB — AMMONIA: Ammonia: 25 umol/L (ref 9–35)

## 2023-06-10 LAB — COMPREHENSIVE METABOLIC PANEL WITH GFR
ALT: 33 U/L (ref 0–44)
AST: 39 U/L (ref 15–41)
Albumin: 3.7 g/dL (ref 3.5–5.0)
Alkaline Phosphatase: 53 U/L (ref 38–126)
Anion gap: 13 (ref 5–15)
BUN: 9 mg/dL (ref 6–20)
CO2: 26 mmol/L (ref 22–32)
Calcium: 8.3 mg/dL — ABNORMAL LOW (ref 8.9–10.3)
Chloride: 101 mmol/L (ref 98–111)
Creatinine, Ser: 0.63 mg/dL (ref 0.61–1.24)
GFR, Estimated: >60 mL/min
Glucose, Bld: 86 mg/dL (ref 70–99)
Potassium: 3.8 mmol/L (ref 3.5–5.1)
Sodium: 140 mmol/L (ref 135–145)
Total Bilirubin: 0.9 mg/dL (ref 0.3–1.2)
Total Protein: 7 g/dL (ref 6.5–8.1)

## 2023-06-10 LAB — CBC
HCT: 43.6 % (ref 39.0–52.0)
Hemoglobin: 14.4 g/dL (ref 13.0–17.0)
MCH: 27.2 pg (ref 26.0–34.0)
MCHC: 33 g/dL (ref 30.0–36.0)
MCV: 82.4 fL (ref 80.0–100.0)
Platelets: 238 10*3/uL (ref 150–400)
RBC: 5.29 MIL/uL (ref 4.22–5.81)
RDW: 14.5 % (ref 11.5–15.5)
WBC: 7.6 10*3/uL (ref 4.0–10.5)
nRBC: 0 % (ref 0.0–0.2)

## 2023-06-10 LAB — MAGNESIUM: Magnesium: 2.2 mg/dL (ref 1.7–2.4)

## 2023-06-10 LAB — TSH: TSH: 1.119 u[IU]/mL (ref 0.350–4.500)

## 2023-06-10 LAB — FOLATE: Folate: 33.4 ng/mL (ref >5.9)

## 2023-06-10 LAB — PHOSPHORUS: Phosphorus: 3.9 mg/dL (ref 2.5–4.6)

## 2023-06-10 MED ORDER — ORAL CARE MOUTH RINSE: 15.0000 mL | OROMUCOSAL | Status: DC | PRN

## 2023-06-10 MED ORDER — DEXMEDETOMIDINE HCL IN NACL 400 MCG/100ML IV SOLN
0.2000 ug/kg/h | INTRAVENOUS | Status: DC
  Administered 2023-06-10: 0.2 ug/kg/h via INTRAVENOUS
  Administered 2023-06-11 (×2): 0.7 ug/kg/h via INTRAVENOUS
  Administered 2023-06-11: 0.6 ug/kg/h via INTRAVENOUS
  Administered 2023-06-12 – 2023-06-14 (×8): 0.7 ug/kg/h via INTRAVENOUS
  Filled 2023-06-10 (×10): qty 100

## 2023-06-10 MED ORDER — POTASSIUM CHLORIDE IN NACL 20-0.9 MEQ/L-% IV SOLN
INTRAVENOUS | Status: AC
  Filled 2023-06-10 (×2): qty 1000

## 2023-06-10 MED ORDER — SODIUM CHLORIDE 0.9 % IV BOLUS
1000.0000 mL | Freq: Once | INTRAVENOUS | Status: AC
  Administered 2023-06-10: 1000 mL via INTRAVENOUS

## 2023-06-10 MED ORDER — NICOTINE 14 MG/24HR TD PT24
14.0000 mg | MEDICATED_PATCH | Freq: Every day | TRANSDERMAL | Status: DC
  Administered 2023-06-10 – 2023-06-16 (×7): 14 mg via TRANSDERMAL
  Filled 2023-06-10 (×7): qty 1

## 2023-06-10 MED ORDER — THIAMINE HCL 100 MG/ML IJ SOLN
250.0000 mg | INTRAVENOUS | Status: AC
  Administered 2023-06-11 – 2023-06-13 (×3): 250 mg via INTRAVENOUS
  Filled 2023-06-10 (×3): qty 2.5

## 2023-06-10 MED ORDER — PHENOBARBITAL SODIUM 65 MG/ML IJ SOLN
65.0000 mg | Freq: Three times a day (TID) | INTRAMUSCULAR | Status: AC
  Administered 2023-06-10 – 2023-06-12 (×6): 65 mg via INTRAVENOUS
  Filled 2023-06-10 (×6): qty 1

## 2023-06-10 MED ORDER — THIAMINE HCL 100 MG/ML IJ SOLN
500.0000 mg | Freq: Three times a day (TID) | INTRAVENOUS | Status: AC
  Administered 2023-06-10 (×3): 500 mg via INTRAVENOUS
  Filled 2023-06-10 (×3): qty 5

## 2023-06-10 MED ORDER — PHENOBARBITAL SODIUM 65 MG/ML IJ SOLN
32.5000 mg | Freq: Three times a day (TID) | INTRAMUSCULAR | Status: AC
  Administered 2023-06-12 – 2023-06-14 (×6): 32.5 mg via INTRAVENOUS
  Filled 2023-06-10 (×6): qty 1

## 2023-06-10 MED ORDER — NICOTINE POLACRILEX 2 MG MT GUM
2.0000 mg | CHEWING_GUM | OROMUCOSAL | Status: DC | PRN
  Administered 2023-06-16: 2 mg via ORAL
  Filled 2023-06-10 (×3): qty 1

## 2023-06-10 NOTE — Plan of Care (Signed)

## 2023-06-10 NOTE — Consult Note (Cosign Needed Addendum)
[facility] Face-to-Face Psychiatry Consult   Reason for Consult:  SUicidal ideations Referring Physician:  Dr. Marland Mcalpine Patient Identification: [name] MRN:  [id number] Principal Diagnosis: Alcohol withdrawal (HCC) Diagnosis:  Principal Problem:   Alcohol withdrawal (HCC) Active Problems:   Suicidal ideation   Total Time spent with patient: 1 hour  Subjective:   [name] is a 52 y.o. male patient admitted with alcohol withdrawal The patient is currently a poor historian, with a level 5 caveat owing to active alcohol delirium. Despite his condition, an attempt was made to collect as comprehensive a history as possible. He presents with significant restlessness, altered sensorium, and fluctuating cognition, complicating the assessment process.  He reports that his alcohol consumption increased approximately seven months ago, subsequent to becoming a 100% service-connected veteran. Over the past two months, his drinking has escalated significantly, though he is unable to quantify the financial impact, only indicating that it is substantial. According to both the patient and confirmation from his girlfriend, he consumes about 36 beers daily. The patient acknowledges experiencing alcohol-related complications, including delirium, tremors, and hallucinations. However, he denies any seizure activity. A brief suicide screening was conducted; he does not recall making any suicidal statements with intent but admits to a history of suicidal ideations and statements while under the influence of alcohol. No suicide attempts were reported, and he denies any active suicidal ideations at the current time.  Given his current medical instability, a more thorough assessment for suicidality will be deferred until he is medically stabilized and more oriented. This approach is necessary to ensure an accurate understanding of his mental health needs and risk factors. Continuous monitoring and supportive  interventions will be maintained to manage his symptoms of delirium and to prepare for further psychiatric evaluation once his condition improves.  HPI:  52 year old man who comes to the hospital frequently for detox and sometimes  leaves AGAINST MEDICAL ADVICE again admitted for medically supervised.moderate-severe risk of alcohol  withdrawal symptoms that could not be safely managed as  an outpatien   Past Psychiatric History:   Risk to Self:   Risk to Others:   Prior Inpatient Therapy:   Prior Outpatient Therapy:    Past Medical History:  Past Medical History:  Diagnosis Date   Active smoker    Alcohol abuse    Anxiety    Bipolar 2 disorder (HCC)    Depression    PTSD (post-traumatic stress disorder)     Past Surgical History:  Procedure Laterality Date   LYMPH NODE DISSECTION     Family History:  Family History  Problem Relation Age of Onset   Alcohol abuse Father    Family Psychiatric  History: Denies Social History:  Social History   Substance and Sexual Activity  Alcohol Use Yes   Comment: Patient states he is a binge drinker.     Social History   Substance and Sexual Activity  Drug Use Yes   Types: Marijuana    Social History   Socioeconomic History   Marital status: Legally Separated    Spouse name: Not on file   Number of children: Not on file   Years of education: Not on file   Highest education level: Not on file  Occupational History   Not on file  Tobacco Use   Smoking status: Every Day    Current packs/day: 2.00    Average packs/day: 2.0 packs/day for 20.0 years (40.0 ttl pk-yrs)    Types: Cigarettes   Smokeless tobacco: Never  Vaping Use   Vaping status: Never Used  Substance and Sexual Activity   Alcohol use: Yes    Comment: Patient states he is a binge drinker.   Drug use: Yes    Types: Marijuana   Sexual activity: Yes  Other Topics Concern   Not on file  Social History Narrative   Has an ex-wife and 10 yo son whom he cannot  see now, as his ex-wife divorced him. Was in the [employer] and went to [place], [place], and may have some PTSD symptoms from this. Was also a Management consultant. Heavy alcohol abuse during binges, but has had periods of sobriety for months on end. Has been drinking up to two cases of beer a day   Social Determinants of Health   Financial Resource Strain: Not on file  Food Insecurity: Not on file  Transportation Needs: Not on file  Physical Activity: Not on file  Stress: Not on file  Social Connections: Unknown (01/13/2023)   Received from [facility]   Social Network    Social Network: Not on file   Additional Social History:    Allergies:   Allergies  Allergen Reactions   Effexor [Venlafaxine] Hives   Nicoderm [Nicotine] Rash   Paxil [Paroxetine Hcl] Hives and Rash    Labs:  Results for orders placed or performed during the hospital encounter of 06/09/23 (from the past 48 hour(s))  Rapid urine drug screen (hospital performed)     Status: Abnormal   Collection Time: 06/09/23  6:08 PM  Result Value Ref Range   Opiates NONE DETECTED NONE DETECTED   Cocaine NONE DETECTED NONE DETECTED   Benzodiazepines NONE DETECTED NONE DETECTED   Amphetamines NONE DETECTED NONE DETECTED   Tetrahydrocannabinol POSITIVE (A) NONE DETECTED   Barbiturates NONE DETECTED NONE DETECTED    Comment: (NOTE) DRUG SCREEN FOR MEDICAL PURPOSES ONLY.  IF CONFIRMATION IS NEEDED FOR ANY PURPOSE, NOTIFY LAB WITHIN 5 DAYS.  LOWEST DETECTABLE LIMITS FOR URINE DRUG SCREEN Drug Class                     Cutoff (ng/mL) Amphetamine and metabolites    1000 Barbiturate and metabolites    200 Benzodiazepine                 200 Opiates and metabolites        300 Cocaine and metabolites        300 THC                            50 Performed at [facility], [address], [place], [place] [address]   Ethanol     Status: Abnormal   Collection Time: 06/09/23  6:25 PM  Result  Value Ref Range   Alcohol, Ethyl (B) 383 (HH) <10 mg/dL    Comment: CRITICAL RESULT CALLED TO, READ BACK BY AND VERIFIED WITH RN H ORE AT 1939 06/09/23 CRUICKSHANK A (NOTE) Lowest detectable limit for serum alcohol is 10 mg/dL.  For medical purposes only. Performed at [facility], [address] [place], [place] [address]   Comprehensive metabolic panel     Status: Abnormal   Collection Time: 06/09/23  6:25 PM  Result Value Ref Range   Sodium 140 135 - 145 mmol/L   Potassium 3.4 (L) 3.5 - 5.1 mmol/L   Chloride 101 98 - 111 mmol/L   CO2 25 22 - 32  mmol/L   Glucose, Bld 103 (H) 70 - 99 mg/dL    Comment: Glucose reference range applies only to samples taken after fasting for at least 8 hours.   BUN 8 6 - 20 mg/dL   Creatinine, Ser 4.54 0.61 - 1.24 mg/dL   Calcium 8.5 (L) 8.9 - 10.3 mg/dL   Total Protein 8.4 (H) 6.5 - 8.1 g/dL   Albumin 4.4 3.5 - 5.0 g/dL   AST 44 (H) 15 - 41 U/L   ALT 38 0 - 44 U/L   Alkaline Phosphatase 60 38 - 126 U/L   Total Bilirubin 0.7 0.3 - 1.2 mg/dL   GFR, Estimated >09 >81 mL/min    Comment: (NOTE) Calculated using the CKD-EPI Creatinine Equation (2021)    Anion gap 14 5 - 15    Comment: Performed at [facility], [address], [place], [place] [address]  CBC with Differential/Platelet     Status: Abnormal   Collection Time: 06/09/23  6:25 PM  Result Value Ref Range   WBC 7.4 4.0 - 10.5 K/uL   RBC 6.07 (H) 4.22 - 5.81 MIL/uL   Hemoglobin 16.4 13.0 - 17.0 g/dL   HCT 82.9 56.2 - 13.0 %   MCV 80.4 80.0 - 100.0 fL   MCH 27.0 26.0 - 34.0 pg   MCHC 33.6 30.0 - 36.0 g/dL   RDW 86.5 78.4 - 69.6 %   Platelets 236 150 - 400 K/uL   nRBC 0.0 0.0 - 0.2 %   Neutrophils Relative % 33 %   Neutro Abs 2.5 1.7 - 7.7 K/uL   Lymphocytes Relative 60 %   Lymphs Abs 4.5 (H) 0.7 - 4.0 K/uL   Monocytes Relative 5 %   Monocytes Absolute 0.4 0.1 - 1.0 K/uL   Eosinophils Relative 1 %   Eosinophils Absolute 0.1 0.0 - 0.5 K/uL    Basophils Relative 1 %   Basophils Absolute 0.0 0.0 - 0.1 K/uL   Immature Granulocytes 0 %   Abs Immature Granulocytes 0.02 0.00 - 0.07 K/uL    Comment: Performed at [facility], [address] [place], [place] [address]  MRSA Next Gen by PCR, Nasal     Status: None   Collection Time: 06/09/23 10:02 PM   Specimen: Nasal Mucosa; Nasal Swab  Result Value Ref Range   MRSA by PCR Next Gen NOT DETECTED NOT DETECTED    Comment: (NOTE) The GeneXpert MRSA Assay (FDA approved for NASAL specimens only), is one component of a comprehensive MRSA colonization surveillance program. It is not intended to diagnose MRSA infection nor to guide or monitor treatment for MRSA infections. Test performance is not FDA approved in patients less than 17 years old. Performed at [facility], [address] [place], [place] [address]   Magnesium     Status: None   Collection Time: 06/10/23  2:59 AM  Result Value Ref Range   Magnesium 2.2 1.7 - 2.4 mg/dL    Comment: Performed at [facility], [address], [place], [place] [address]  Phosphorus     Status: None   Collection Time: 06/10/23  2:59 AM  Result Value Ref Range   Phosphorus 3.9 2.5 - 4.6 mg/dL    Comment: Performed at [facility], [address], [place], [place] [address]  CBC     Status: None   Collection Time: 06/10/23  2:59 AM  Result Value Ref Range   WBC 7.6 4.0 -  10.5 K/uL   RBC 5.29 4.22 - 5.81 MIL/uL   Hemoglobin 14.4 13.0 - 17.0 g/dL   HCT 16.1 09.6 - 04.5 %   MCV 82.4 80.0 - 100.0 fL   MCH 27.2 26.0 - 34.0 pg   MCHC 33.0 30.0 - 36.0 g/dL   RDW 40.9 81.1 - 91.4 %   Platelets 238 150 - 400 K/uL   nRBC 0.0 0.0 - 0.2 %    Comment: Performed at [facility], [address], [place], [place] [address]  Comprehensive metabolic panel     Status: Abnormal   Collection Time: 06/10/23  2:59 AM  Result Value Ref Range   Sodium 140 135 - 145  mmol/L   Potassium 3.8 3.5 - 5.1 mmol/L   Chloride 101 98 - 111 mmol/L   CO2 26 22 - 32 mmol/L   Glucose, Bld 86 70 - 99 mg/dL    Comment: Glucose reference range applies only to samples taken after fasting for at least 8 hours.   BUN 9 6 - 20 mg/dL   Creatinine, Ser 6.21 0.61 - 1.24 mg/dL   Calcium 8.3 (L) 8.9 - 10.3 mg/dL   Total Protein 7.0 6.5 - 8.1 g/dL   Albumin 3.7 3.5 - 5.0 g/dL   AST 39 15 - 41 U/L   ALT 33 0 - 44 U/L   Alkaline Phosphatase 53 38 - 126 U/L   Total Bilirubin 0.9 0.3 - 1.2 mg/dL   GFR, Estimated >30 >86 mL/min    Comment: (NOTE) Calculated using the CKD-EPI Creatinine Equation (2021)    Anion gap 13 5 - 15    Comment: Performed at [facility], [address] [place], [place] [address]  TSH     Status: None   Collection Time: 06/10/23  7:57 AM  Result Value Ref Range   TSH 1.119 0.350 - 4.500 uIU/mL    Comment: Performed by a 3rd Generation assay with a functional sensitivity of <=0.01 uIU/mL. Performed at [facility], [address] [place], [place] [address]   Vitamin B12     Status: None   Collection Time: 06/10/23  7:57 AM  Result Value Ref Range   Vitamin B-12 204 180 - 914 pg/mL    Comment: (NOTE) This assay is not validated for testing neonatal or myeloproliferative syndrome specimens for Vitamin B12 levels. Performed at [facility], [address] [place], [place] [address]     Current Facility-Administered Medications  Medication Dose Route Frequency Provider Last Rate Last Admin   0.9 % NaCl with KCl 20 mEq/ L  infusion   Intravenous Continuous Opyd, Lavone Neri, MD 100 mL/hr at 06/10/23 0800 Infusion Verify at 06/10/23 0800   acetaminophen (TYLENOL) tablet 650 mg  650 mg Oral Q6H PRN Opyd, Lavone Neri, MD       Or   acetaminophen (TYLENOL) suppository 650 mg  650 mg Rectal Q6H PRN Opyd, Lavone Neri, MD       Chlorhexidine Gluconate Cloth 2 % PADS 6 each  6 each Topical Daily Opyd,  Lavone Neri, MD   6 each at 06/09/23 2159   enoxaparin (LOVENOX) injection 40 mg  40 mg Subcutaneous Q24H Opyd, Lavone Neri, MD   40 mg at 06/09/23 2208   folic acid (FOLVITE) tablet 1 mg  1 mg Oral Daily Opyd, Lavone Neri, MD   1 mg at 06/10/23 0738   LORazepam (ATIVAN) injection 0-4 mg  0-4 mg Intravenous Q4H  Briscoe Deutscher, MD   2 mg at 06/10/23 0202   Followed by   Melene Muller ON 06/11/2023] LORazepam (ATIVAN) injection 0-4 mg  0-4 mg Intravenous Q8H Opyd, Lavone Neri, MD       LORazepam (ATIVAN) tablet 1-4 mg  1-4 mg Oral Q1H PRN Opyd, Lavone Neri, MD       Or   LORazepam (ATIVAN) injection 1-4 mg  1-4 mg Intravenous Q1H PRN Opyd, Lavone Neri, MD   2 mg at 06/10/23 0454   multivitamin with minerals tablet 1 tablet  1 tablet Oral Daily Opyd, Lavone Neri, MD   1 tablet at 06/10/23 0739   Oral care mouth rinse  15 mL Mouth Rinse PRN Opyd, Lavone Neri, MD       PHENObarbital (LUMINAL) injection 65 mg  65 mg Intravenous Q8H Maryagnes Amos, FNP   65 mg at 06/10/23 0981   Followed by   Melene Muller ON 06/12/2023] PHENObarbital (LUMINAL) injection 32.5 mg  32.5 mg Intravenous Q8H Starkes-Perry, Juel Burrow, FNP       prochlorperazine (COMPAZINE) injection 5 mg  5 mg Intravenous Q4H PRN Opyd, Lavone Neri, MD       senna-docusate (Senokot-S) tablet 1 tablet  1 tablet Oral QHS PRN Opyd, Lavone Neri, MD       sodium chloride flush (NS) 0.9 % injection 3 mL  3 mL Intravenous Q12H Opyd, Lavone Neri, MD   3 mL at 06/09/23 2212   thiamine (VITAMIN B1) tablet 100 mg  100 mg Oral Daily Opyd, Lavone Neri, MD   100 mg at 06/10/23 1914   Or   thiamine (VITAMIN B1) injection 100 mg  100 mg Intravenous Daily Opyd, Lavone Neri, MD        Musculoskeletal: Strength & Muscle Tone: within normal limits Gait & Station: unsteady Patient leans: N/A  Psychiatric Specialty Exam:  Presentation  General Appearance:  Casual; Disheveled  Eye Contact: Fleeting  Speech: Clear and Coherent; Normal Rate  Speech  Volume: Normal  Handedness: Right   Mood and Affect  Mood: Depressed  Affect: Congruent; Depressed   Thought Process  Thought Processes: Coherent; Goal Directed; Linear  Descriptions of Associations:Intact  Orientation:Full (Time, Place and Person)  Thought Content:Logical  History of Schizophrenia/Schizoaffective disorder:No data recorded Duration of Psychotic Symptoms:No data recorded Hallucinations:No data recorded Ideas of Reference:None  Suicidal Thoughts:No data recorded Homicidal Thoughts:No data recorded  Sensorium  Memory: Immediate Good; Recent Good; Remote Good  Judgment: Intact  Insight: Fair   Art therapist  Concentration: Fair  Attention Span: Fair  Recall: [employer] of Knowledge: Fair  Language: Fair   Psychomotor Activity  Psychomotor Activity:No data recorded  Assets  Assets: Manufacturing systems engineer; Desire for Improvement; Financial Resources/Insurance; Housing; Intimacy   Sleep  Sleep:No data recorded  Physical Exam: Physical Exam Vitals and nursing note reviewed.  Constitutional:      General: He is awake. He is in acute distress.     Appearance: He is normal weight.     Interventions: Nasal cannula in place.  Neurological:     General: No focal deficit present.     Mental Status: He is easily aroused. He is disoriented and confused.     Cranial Nerves: Cranial nerves 2-12 are intact.     Gait: Gait abnormal (unsteady).     Deep Tendon Reflexes: Reflexes are normal and symmetric.  Psychiatric:        Attention and Perception: He is inattentive. He perceives visual (pointing at things in  the air) hallucinations.        Mood and Affect: Affect is labile, blunt and inappropriate.        Speech: Speech is slurred.        Behavior: Behavior is uncooperative and agitated.        Thought Content: Thought content normal.        Cognition and Memory: Cognition is impaired. Memory is impaired.        Judgment:  Judgment is impulsive.    Review of Systems  Psychiatric/Behavioral:  Positive for hallucinations and substance abuse (ETOH). The patient is nervous/anxious.   All other systems reviewed and are negative.  Blood pressure (!) 160/98, pulse 94, temperature 98.3 F (36.8 C), temperature source Oral, resp. rate (!) 33, height 5\' 10"  (1.778 m), weight 68.7 kg, SpO2 92%. Body mass index is 21.73 kg/m.  Treatment Plan Summary: Daily contact with patient to assess and evaluate symptoms and progress in treatment, Medication management, and Plan    Alcohol detox:  -Continue CIWA protocol with ativan. Patient is a candidate for Phenobarb, recent CIWA scores > 30. Will likely require Dex, will leave that up to primary team.  -Labs obtained include B12(536>204), TSH (1.11), BAL (363), UDS (THC),   -Alcohol Delirium:  Will start high dose IV thiamine, B1 obtained. Thiamine 500mg  po TID x 3 days.  -Will start Phenobarb, likley will require dex.  -Will start Haldol 2mg  q6hr IV.  -EKG not obtained, will order at this time when appropriate. QTC within normal. -Continue safety sitter due to high risk for falls A psychiatry consult service to follow from a distant.  We will reassess suicide risk factors, when patient is more medically stable.  Patient will likely benefit from inpatient residential rehab at the [place].  Recommend TOC start process now to prevent further delay in transfer.   Disposition:  Disposition is pending, Patient currently receiivng multiple services at the [place]. Generally admitted once a month.   Maryagnes Amos, FNP 06/10/2023 9:17 AM

## 2023-06-10 NOTE — Progress Notes (Signed)
PROGRESS NOTE    [name]  [name]:[id number] DOB: 06-18-1971 DOA: 06/09/2023 PCP: [facility]   Brief Narrative:  Patient is a 52 year old Caucasian male with a past medical history significant for Brilinta anxiety, depression, PTSD, bipolar type II, alcohol abuse and dependence who presented to the ED seeking help for alcohol problem.  He had been consuming roughly about 25 beers a day and expressing suicidal ideations was brought in by his significant other.  Confirms feeling suicidal but was unable to elaborate and subsequently started withdrawing.  Started having some hallucinations.  Upon arrival to ED he is afebrile saturating low 90s on room air with an elevated heart rate and stable blood pressure.  His alcohol level on admission was 383.  He was given 1 L of LR and 2 mg of IV Haldol and 2 mg IV Ativan and thiamine in the ED.  Subsequently he was placed on a one-to-one sitter and psychiatry was consulted.  Psychiatry has now placed him on a phenobarbital taper as he continues to receive the stepdown/ICU alcohol withdrawal protocol with CIWA and lorazepam.  Assessment and Plan:  Alcohol Dependence and intoxication with Withdrawal and Hallucinations  -Continue with CIWA protocol and with scheduled lorazepam and as needed lorazepam for withdrawal symptoms -Patient's alcohol level on admission was 383 -Continue with multivitamin with minerals, folic acid and thiamine but will continue high-dose thiamine 500 mg 3 times daily for 3 doses and then to 50 mg 3 times daily for 3 doses and then start p.o./IV 100 mg daily -Continue with supportive care and continue with IV fluid hydration with normal saline +20 mg once KCl at 100 mL/h for 20 hours; will give another 1 L bolus -His ammonia level was 25 and his TSH was 1.119 -Psychiatry evaluated and starting the patient on phenobarbital taper with IV 6 5 mg every 8 for 6 doses and then 32.5 mg every 8 for 6 more doses   Suicidal  Ideation  Hx of Bipolar Disorder, Anxiety and Depression, PTSD -Pt confirmed SI but is unable to contribute much else to the history in ED  -Suicide precautions, consult psychiatry once he is able to participate in interview  -Psychiatry evaluated and he denied any suicidal ideation to the psychiatry team today.  Will continue one-to-one sitter just in case though given his agitation and hallucinations   Hypokalemia -Patient's K+ Level Trend: Recent Labs  Lab 06/09/23 1825 06/10/23 0259  K 3.4* 3.8  -Replete with IV NaCl + 20 mEQ of KCL at 100 mL/hr x 20 hour -Continue to Monitor and Replete as Necessary -Repeat CMP in the AM   Elevated AST -Mild and Likely Reactive. LFT Trend: Recent Labs  Lab 06/09/23 1825 06/10/23 0259  AST 44* 39  ALT 38 33  -Continue to Monitor and trend and repeat CMP in the AM   THC Positive -UDS + for THC -Counseling given -Will need continued monitoring   DVT prophylaxis: enoxaparin (LOVENOX) injection 40 mg Start: 06/09/23 2200    Code Status: Full Code Family Communication: No family currently at bedside  Disposition Plan:  Level of care: Stepdown Status is: Inpatient Remains inpatient appropriate because: He is further clinical improvement in his withdrawal symptoms and anticipating him discharging probably after 48 hours   Consultants:  Psychiatry  Procedures:  As delineated as above  Antimicrobials:  Anti-infectives (From admission, onward)    None       Subjective: Seen and examined at bedside he is extremely tremulous and  was also hallucinating.  States that he did not feel as well.  Does not recall how much alcohol he has been drinking.  No other concerns or complaints at this time.  Objective: Vitals:   06/10/23 1000 06/10/23 1100 06/10/23 1200 06/10/23 1250  BP: (!) 175/77   131/61  Pulse: (!) 118 (!) 116 (!) 103 (!) 101  Resp:      Temp:   98.6 F (37 C)   TempSrc:   Oral   SpO2:      Weight:      Height:         Intake/Output Summary (Last 24 hours) at 06/10/2023 1312 Last data filed at 06/10/2023 1308 Gross per 24 hour  Intake 3236.23 ml  Output 2700 ml  Net 536.23 ml   Filed Weights   06/09/23 1753 06/09/23 2216  Weight: 81.5 kg 68.7 kg   Examination: Physical Exam:  Constitutional: WN/WD Caucasian male who is extremely tremulous and anxious Respiratory: Diminished to auscultation bilaterally, no wheezing, rales, rhonchi or crackles. Normal respiratory effort and patient is not tachypenic. No accessory muscle use.  Unlabored breathing Cardiovascular: Tachycardic rate but regular rhythm, no murmurs / rubs / gallops. S1 and S2 auscultated. No extremity edema.  Abdomen: Soft, non-tender, non-distended. Bowel sounds positive.  GU: Deferred. Musculoskeletal: No clubbing / cyanosis of digits/nails. No joint deformity upper and lower extremities.  Skin: No rashes, lesions, ulcers on limited skin evaluation. No induration; Warm and dry.  Neurologic: CN 2-12 grossly intact with no focal deficits. Romberg sign cerebellar reflexes not assessed.  Psychiatric:  Anxious mood and affect and is hallucinating little bit  Data Reviewed: I have personally reviewed following labs and imaging studies  CBC: Recent Labs  Lab 06/09/23 1825 06/10/23 0259  WBC 7.4 7.6  NEUTROABS 2.5  --   HGB 16.4 14.4  HCT 48.8 43.6  MCV 80.4 82.4  PLT 236 238   Basic Metabolic Panel: Recent Labs  Lab 06/09/23 1825 06/10/23 0259  NA 140 140  K 3.4* 3.8  CL 101 101  CO2 25 26  GLUCOSE 103* 86  BUN 8 9  CREATININE 0.75 0.63  CALCIUM 8.5* 8.3*  MG  --  2.2  PHOS  --  3.9   GFR: Estimated Creatinine Clearance: 106.2 mL/min (by C-G formula based on SCr of 0.63 mg/dL). Liver Function Tests: Recent Labs  Lab 06/09/23 1825 06/10/23 0259  AST 44* 39  ALT 38 33  ALKPHOS 60 53  BILITOT 0.7 0.9  PROT 8.4* 7.0  ALBUMIN 4.4 3.7   No results for input(s): "LIPASE", "AMYLASE" in the last 168 hours. Recent  Labs  Lab 06/10/23 1036  AMMONIA 25   Coagulation Profile: No results for input(s): "INR", "PROTIME" in the last 168 hours. Cardiac Enzymes: No results for input(s): "CKTOTAL", "CKMB", "CKMBINDEX", "TROPONINI" in the last 168 hours. BNP (last 3 results) No results for input(s): "PROBNP" in the last 8760 hours. HbA1C: No results for input(s): "HGBA1C" in the last 72 hours. CBG: No results for input(s): "GLUCAP" in the last 168 hours. Lipid Profile: No results for input(s): "CHOL", "HDL", "LDLCALC", "TRIG", "CHOLHDL", "LDLDIRECT" in the last 72 hours. Thyroid Function Tests: Recent Labs    06/10/23 0757  TSH 1.119   Anemia Panel: Recent Labs    06/10/23 0757  VITAMINB12 204  FOLATE 33.4   Sepsis Labs: No results for input(s): "PROCALCITON", "LATICACIDVEN" in the last 168 hours.  Recent Results (from the past 240 hour(s))  MRSA Next Gen  by PCR, Nasal     Status: None   Collection Time: 06/09/23 10:02 PM   Specimen: Nasal Mucosa; Nasal Swab  Result Value Ref Range Status   MRSA by PCR Next Gen NOT DETECTED NOT DETECTED Final    Comment: (NOTE) The GeneXpert MRSA Assay (FDA approved for NASAL specimens only), is one component of a comprehensive MRSA colonization surveillance program. It is not intended to diagnose MRSA infection nor to guide or monitor treatment for MRSA infections. Test performance is not FDA approved in patients less than 82 years old. Performed at [facility], [address], [place], [place] [address]     Radiology Studies: No results found.  Scheduled Meds:  Chlorhexidine Gluconate Cloth  6 each Topical Daily   enoxaparin (LOVENOX) injection  40 mg Subcutaneous Q24H   folic acid  1 mg Oral Daily   LORazepam  0-4 mg Intravenous Q4H   Followed by   Melene Muller ON 06/11/2023] LORazepam  0-4 mg Intravenous Q8H   multivitamin with minerals  1 tablet Oral Daily   PHENObarbital  65 mg Intravenous Q8H   Followed by   Melene Muller ON  06/12/2023] PHENObarbital  32.5 mg Intravenous Q8H   sodium chloride flush  3 mL Intravenous Q12H   Continuous Infusions:  0.9 % NaCl with KCl 20 mEq / L 100 mL/hr at 06/10/23 0800   thiamine (VITAMIN B1) injection 500 mg (06/10/23 1303)   Followed by   Melene Muller ON 06/11/2023] thiamine (VITAMIN B1) injection      LOS: 1 day   Marguerita Merles, DO [facility] Available via Epic secure chat 7am-7pm After these hours, please refer to coverage provider listed on [email or web address] 06/10/2023, 1:12 PM

## 2023-06-10 NOTE — Hospital Course (Addendum)
The Patient is a 52 year old Caucasian male with a past medical history significant for Brilinta anxiety, depression, PTSD, bipolar type II, alcohol abuse and dependence who presented to the ED seeking help for alcohol problem.  He had been consuming roughly about 25 beers a day and expressing suicidal ideations was brought in by his significant other.  Confirms feeling suicidal but was unable to elaborate and subsequently started withdrawing.  Started having some hallucinations.  Upon arrival to ED he is afebrile saturating low 90s on room air with an elevated heart rate and stable blood pressure.  His alcohol level on admission was 383.  He was given 1 L of LR and 2 mg of IV Haldol and 2 mg IV Ativan and thiamine in the ED.  Subsequently he was placed on a one-to-one sitter and psychiatry was consulted.  Psychiatry has now placed him on a phenobarbital taper as he continues to receive the stepdown/ICU alcohol withdrawal protocol with CIWA and lorazepam but given his continued withdrawal will place on a Precedex gtt.   Patient continued to be extremely agitated and started becoming combative so Precedex drip was initiated being titrated and is now on max dose.  Given concern for his safety and given his prior suicidal ideation a decision was made to involuntarily commit him until he is out of withdrawals to properly assess his psychiatric status.  He continues to be on max dose of the Precedex and continues to have intermittent agitation and also was on the phenobarbital taper and I feel less likely that he is withdrawing now and I feel that he is more having behavioral outburst.  Given his outburst this morning and wanting to leave we added IV Haldol per psychiatric recommendations and reconsult psych.  If psych clears today him and feel that he is not suicidal or danger to himself or others we can rescind his IVC but they had recommended inpatient residential rehab at the [place] currently and will need to have a  reassessment today.  Assessment and Plan:  Alcohol Dependence and Intoxication with Withdrawal and Hallucinations, improving -Continued with CIWA Protocol and with scheduled lorazepam and as needed lorazepam for withdrawal symptoms however changed to Precedex as below -Patient's alcohol level on admission was 383 -Continue with multivitamin with minerals, folic acid and thiamine but will continue high-dose thiamine 500 mg 3 times daily for 3 doses and then 250 mg 3 times daily for 3 doses and then start p.o. 100 mg daily on 06/14/2023 -IV fluid hydration has now stopped. -His ammonia level was 25 and his TSH was 1.119 -Given the severity of his Withdrawal Symptoms will start Dexmedetomidine 0.2-0.7 mcg and remains on max dose currently still and is on a phenobarbital taper as above -Psychiatry evaluated and starting the patient on phenobarbital taper with IV 6 5 mg every 8h for 6 doses which he is already completed and has now been initiated on phenobarbital 32.5 mg every 8 for 6 more doses -Psychiatry felt that he does not need Inpatient Psychiatric Hospitalization but feel that he requires Inpatient Residential Rehab at the [facility] to start the process with patient is wanting to leave   Suicidal Ideation  Hx of Bipolar Disorder, Anxiety and Depression, PTSD -Pt confirmed SI but is unable to contribute much else to the history in ED  -Suicide precautions for now and one-to-one sitter -Psychiatry evaluated and he denied any suicidal ideation to the psychiatry team yesterday.  Will continue one-to-one sitter just in case though given  his agitation and hallucinations -Given his prior suicidal ideations and his attempt to leave AGAINST MEDICAL ADVICE will involuntarily commit him for now and get him out of withdrawals and then appropriately assess his psychiatric status and his suicidal ideations; IF psych clears him we will rescind his IVC but he continues to have behavioral outbursts and agitation  with security being called on him multiple times -Psychiatry recommending Haldol 2 mg every 6 IV for agitation and delirium -Psychiatry feels that he does not need Inpatient Psychiatric Hospitalization but feel that he requires Inpatient Residential Rehab at the [facility] and have recommended the [facility] team to start the process -I do not feel the patient is withdrawing from alcohol as much now but he did have slight tremors.I think he is starting to have some behavioral issues from his Psychiatric issues rather than from alcohol given his extreme agitation and outbursts in wanting to leave.  He had to be given Haldol and Ativan and remains on the Precedex drip maxed.  We have asked psychiatry to reevaluate him and patient denied to me that he had suicidal ideation and was frustrated at his girlfriend had reported his SI.  Will need to elucidate if he is truly suicidal given that he has improved from a withdrawal standpoint and I feel that this is mainly behavioral issues now going on   Hypokalemia -Patient's K+ Level Trend: Recent Labs  Lab 06/09/23 1825 06/10/23 0259 06/11/23 0300 06/12/23 0310 06/13/23 0300  K 3.4* 3.8 3.8 3.8 3.6  -IV fluid hydration has now stopped -Continue to Monitor and Replete as Necessary -Repeat CMP in the AM   Elevated AST, mild and improved -Mild and Likely Reactive. LFT Trend: Recent Labs  Lab 06/09/23 1825 06/10/23 0259 06/11/23 0300 06/12/23 0310 06/13/23 0300  AST 44* 39 37 42* 38  ALT 38 33 30 33 34  -Continue to Monitor and trend and repeat CMP in the AM   THC Positive -UDS + for THC -Counseling given -Will need continued monitoring  Tobacco Abuse -Smoking cessation counseling given and patient has been initiated on a nicotine transdermal patch 14 mg daily and nicotine polacrilex gum 2 mg as needed.   -Smoking cessation given  Hyperbilirubinemia -Mild. Bilirubin Trend: Recent Labs  Lab 06/09/23 1825 06/10/23 0259 06/11/23 0300 06/12/23 0310  06/13/23 0300  BILITOT 0.7 0.9 1.3* 0.6 0.9  -Continue to Monitor and Trend and repeat CMP in the AM   Hypoalbuminemia -Patient's Albumin Trend: Recent Labs  Lab 06/09/23 1825 06/10/23 0259 06/11/23 0300 06/12/23 0310 06/13/23 0300  ALBUMIN 4.4 3.7 3.3* 3.7 3.8  -Continue to Monitor and Trend and repeat CMP in the AM

## 2023-06-11 DIAGNOSIS — R45851 Suicidal ideations: Secondary | ICD-10-CM | POA: Diagnosis not present

## 2023-06-11 DIAGNOSIS — F515 Nightmare disorder: Secondary | ICD-10-CM

## 2023-06-11 DIAGNOSIS — F10931 Alcohol use, unspecified with withdrawal delirium: Secondary | ICD-10-CM | POA: Diagnosis not present

## 2023-06-11 DIAGNOSIS — E876 Hypokalemia: Secondary | ICD-10-CM | POA: Diagnosis not present

## 2023-06-11 LAB — COMPREHENSIVE METABOLIC PANEL
ALT: 30 U/L (ref 0–44)
AST: 37 U/L (ref 15–41)
Albumin: 3.3 g/dL — ABNORMAL LOW (ref 3.5–5.0)
Alkaline Phosphatase: 55 U/L (ref 38–126)
Anion gap: 9 (ref 5–15)
BUN: 11 mg/dL (ref 6–20)
CO2: 23 mmol/L (ref 22–32)
Calcium: 8.6 mg/dL — ABNORMAL LOW (ref 8.9–10.3)
Chloride: 106 mmol/L (ref 98–111)
Creatinine, Ser: 0.77 mg/dL (ref 0.61–1.24)
GFR, Estimated: >60 mL/min (ref >60)
Glucose, Bld: 118 mg/dL — ABNORMAL HIGH (ref 70–99)
Potassium: 3.8 mmol/L (ref 3.5–5.1)
Sodium: 138 mmol/L (ref 135–145)
Total Bilirubin: 1.3 mg/dL — ABNORMAL HIGH (ref 0.3–1.2)
Total Protein: 6.7 g/dL (ref 6.5–8.1)

## 2023-06-11 LAB — CBC WITH DIFFERENTIAL/PLATELET
Abs Immature Granulocytes: 0.01 10*3/uL (ref 0.00–0.07)
Basophils Absolute: 0 10*3/uL (ref 0.0–0.1)
Basophils Relative: 1 %
Eosinophils Absolute: 0.1 10*3/uL (ref 0.0–0.5)
Eosinophils Relative: 3 %
HCT: 40.5 % (ref 39.0–52.0)
Hemoglobin: 13.3 g/dL (ref 13.0–17.0)
Immature Granulocytes: 0 %
Lymphocytes Relative: 38 %
Lymphs Abs: 1.9 10*3/uL (ref 0.7–4.0)
MCH: 27 pg (ref 26.0–34.0)
MCHC: 32.8 g/dL (ref 30.0–36.0)
MCV: 82.3 fL (ref 80.0–100.0)
Monocytes Absolute: 0.3 10*3/uL (ref 0.1–1.0)
Monocytes Relative: 6 %
Neutro Abs: 2.6 10*3/uL (ref 1.7–7.7)
Neutrophils Relative %: 52 %
Platelets: 154 10*3/uL (ref 150–400)
RBC: 4.92 MIL/uL (ref 4.22–5.81)
RDW: 14 % (ref 11.5–15.5)
WBC: 4.9 10*3/uL (ref 4.0–10.5)
nRBC: 0 % (ref 0.0–0.2)

## 2023-06-11 LAB — MAGNESIUM: Magnesium: 2.3 mg/dL (ref 1.7–2.4)

## 2023-06-11 LAB — PHOSPHORUS: Phosphorus: 3.1 mg/dL (ref 2.5–4.6)

## 2023-06-11 NOTE — Progress Notes (Signed)
PROGRESS NOTE    [name]  VHQ:[id number] DOB: Apr 04, 1971 DOA: 06/09/2023 PCP: [facility]   Brief Narrative:  The Patient is a 52 year old Caucasian male with a past medical history significant for Brilinta anxiety, depression, PTSD, bipolar type II, alcohol abuse and dependence who presented to the ED seeking help for alcohol problem.  He had been consuming roughly about 25 beers a day and expressing suicidal ideations was brought in by his significant other.  Confirms feeling suicidal but was unable to elaborate and subsequently started withdrawing.  Started having some hallucinations.  Upon arrival to ED he is afebrile saturating low 90s on room air with an elevated heart rate and stable blood pressure.  His alcohol level on admission was 383.  He was given 1 L of LR and 2 mg of IV Haldol and 2 mg IV Ativan and thiamine in the ED.  Subsequently he was placed on a one-to-one sitter and psychiatry was consulted.  Psychiatry has now placed him on a phenobarbital taper as he continues to receive the stepdown/ICU alcohol withdrawal protocol with CIWA and lorazepam but given his continued withdrawal will place on a Precedex gtt.   Patient continued to be extremely agitated and started becoming combative so Precedex drip was being titrated.  Given concern for his safety and given his prior suicidal ideation a decision was made to involuntarily commit him until he is out of withdrawals to properly assess his psychiatric status.   Assessment and Plan:  Alcohol Dependence and intoxication with Withdrawal and Hallucinations  -Continued with CIWA protocol and with scheduled lorazepam and as needed lorazepam for withdrawal symptoms however changed to Precedex as below -Patient's alcohol level on admission was 383 -Continue with multivitamin with minerals, folic acid and thiamine but will continue high-dose thiamine 500 mg 3 times daily for 3 doses and then to 50 mg 3 times daily for 3  doses and then start p.o./IV 100 mg daily -Continue with supportive care and continue with IV fluid hydration again with normal saline +20 mg once KCl at 100 mL/h for 20 hours; will give another 1 L bolus -His ammonia level was 25 and his TSH was 1.119 -Given the severity of his Withdrawal Symptoms will start Dexmedetomidine 0.2-0.7 mcg -Psychiatry evaluated and starting the patient on phenobarbital taper with IV 6 5 mg every 8 for 6 doses and then 32.5 mg every 8 for 6 more doses   Suicidal Ideation  Hx of Bipolar Disorder, Anxiety and Depression, PTSD -Pt confirmed SI but is unable to contribute much else to the history in ED  -Suicide precautions for now and one-to-one sitter -Psychiatry evaluated and he denied any suicidal ideation to the psychiatry team yesterday.  Will continue one-to-one sitter just in case though given his agitation and hallucinations -Given his prior suicidal ideations and his attempt to leave AGAINST MEDICAL ADVICE will involuntarily commit him for now and get him out of withdrawals and then appropriately assess his psychiatric status and his suicidal ideations   Hypokalemia -Patient's K+ Level Trend: Recent Labs  Lab 06/09/23 1825 06/10/23 0259 06/11/23 0300  K 3.4* 3.8 3.8  -Replete with IV NaCl + 20 mEQ of KCL at 100 mL/hr x 20 hour again  -Continue to Monitor and Replete as Necessary -Repeat CMP in the AM   Elevated AST, improved -Mild and Likely Reactive. LFT Trend: Recent Labs  Lab 06/09/23 1825 06/10/23 0259 06/11/23 0300  AST 44* 39 37  ALT 38 33 30  -Continue  to Monitor and trend and repeat CMP in the AM   THC Positive -UDS + for THC -Counseling given -Will need continued monitoring  Tobacco Abuse -Smoking cessation counseling given and patient has been initiated on a nicotine transdermal patch 14 mg daily and nicotine polacrilex gum 2 mg as needed.  Smoking cessation  Hyperbilirubinemia -Mild. Bilirubin Trend: Recent Labs  Lab  06/09/23 1825 06/10/23 0259 06/11/23 0300  BILITOT 0.7 0.9 1.3*  -Continue to Monitor and Trend and repeat CMP in the AM   Hypoalbuminemia -Patient's Albumin Trend: Recent Labs  Lab 06/09/23 1825 06/10/23 0259 06/11/23 0300  ALBUMIN 4.4 3.7 3.3*  -Continue to Monitor and Trend and repeat CMP in the AM   DVT prophylaxis: enoxaparin (LOVENOX) injection 40 mg Start: 06/09/23 2200    Code Status: Full Code Family Communication: No family current at bedside  Disposition Plan:  Level of care: Stepdown Status is: Inpatient Remains inpatient appropriate because: He is now involuntary committed and will be continued to be treated for his alcohol withdrawal and is now on a Precedex drip   Consultants:  None  Procedures:  As delineated as above  Antimicrobials:  Anti-infectives (From admission, onward)    None       Subjective: Patient is seen and examined at bedside after he had been given some Ativan so he is calmer now but this morning he was extremely agitated and combative and tried to leave.  Given his concern for suicidal ideation still we decided to involuntary commit him.  Patient now denies any current complaints but is a lot more calmer per nursing staff.  No other concerns or complaints at this time.  Objective: Vitals:   06/11/23 0853 06/11/23 0900 06/11/23 1000 06/11/23 1100  BP:   116/77 112/74  Pulse: 63 74 65 66  Resp: (!) 23 18 15 16   Temp: (!) 96.8 F (36 C)     TempSrc: Axillary     SpO2: 91% (!) 89% 94% 96%  Weight:      Height:        Intake/Output Summary (Last 24 hours) at 06/11/2023 1252 Last data filed at 06/11/2023 1223 Gross per 24 hour  Intake 3193.87 ml  Output 3830 ml  Net -636.13 ml   Filed Weights   06/09/23 1753 06/09/23 2216  Weight: 81.5 kg 68.7 kg   Examination: Physical Exam:  Constitutional: Thin Caucasian male who was resting a little somnolent and drowsy after he just received some Ativan Respiratory: Diminished to  auscultation bilaterally, no wheezing, rales, rhonchi or crackles. Normal respiratory effort and patient is not tachypenic. No accessory muscle use.  Unlabored breathing Cardiovascular: RRR, no murmurs / rubs / gallops. S1 and S2 auscultated. No extremity edema.  Abdomen: Soft, non-tender, non-distended. Bowel sounds positive.  GU: Deferred. Musculoskeletal: No clubbing / cyanosis of digits/nails. No joint deformity upper and lower extremities.  Skin: No rashes, lesions, ulcers on limited skin evaluation. No induration; Warm and dry.  Neurologic: CN 2-12 grossly intact with no focal deficits. Romberg sign and cerebellar reflexes not assessed.  Psychiatric: Impaired judgment and insight appears extremely calm now after Ativan  Data Reviewed: I have personally reviewed following labs and imaging studies  CBC: Recent Labs  Lab 06/09/23 1825 06/10/23 0259 06/11/23 0300  WBC 7.4 7.6 4.9  NEUTROABS 2.5  --  2.6  HGB 16.4 14.4 13.3  HCT 48.8 43.6 40.5  MCV 80.4 82.4 82.3  PLT 236 238 154   Basic Metabolic Panel: Recent Labs  Lab 06/09/23 1825 06/10/23 0259 06/11/23 0300  NA 140 140 138  K 3.4* 3.8 3.8  CL 101 101 106  CO2 25 26 23   GLUCOSE 103* 86 118*  BUN 8 9 11   CREATININE 0.75 0.63 0.77  CALCIUM 8.5* 8.3* 8.6*  MG  --  2.2 2.3  PHOS  --  3.9 3.1   GFR: Estimated Creatinine Clearance: 106.2 mL/min (by C-G formula based on SCr of 0.77 mg/dL). Liver Function Tests: Recent Labs  Lab 06/09/23 1825 06/10/23 0259 06/11/23 0300  AST 44* 39 37  ALT 38 33 30  ALKPHOS 60 53 55  BILITOT 0.7 0.9 1.3*  PROT 8.4* 7.0 6.7  ALBUMIN 4.4 3.7 3.3*   No results for input(s): "LIPASE", "AMYLASE" in the last 168 hours. Recent Labs  Lab 06/10/23 1036  AMMONIA 25   Coagulation Profile: No results for input(s): "INR", "PROTIME" in the last 168 hours. Cardiac Enzymes: No results for input(s): "CKTOTAL", "CKMB", "CKMBINDEX", "TROPONINI" in the last 168 hours. BNP (last 3  results) No results for input(s): "PROBNP" in the last 8760 hours. HbA1C: No results for input(s): "HGBA1C" in the last 72 hours. CBG: No results for input(s): "GLUCAP" in the last 168 hours. Lipid Profile: No results for input(s): "CHOL", "HDL", "LDLCALC", "TRIG", "CHOLHDL", "LDLDIRECT" in the last 72 hours. Thyroid Function Tests: Recent Labs    06/10/23 0757  TSH 1.119   Anemia Panel: Recent Labs    06/10/23 0757  VITAMINB12 204  FOLATE 33.4   Sepsis Labs: No results for input(s): "PROCALCITON", "LATICACIDVEN" in the last 168 hours.  Recent Results (from the past 240 hour(s))  MRSA Next Gen by PCR, Nasal     Status: None   Collection Time: 06/09/23 10:02 PM   Specimen: Nasal Mucosa; Nasal Swab  Result Value Ref Range Status   MRSA by PCR Next Gen NOT DETECTED NOT DETECTED Final    Comment: (NOTE) The GeneXpert MRSA Assay (FDA approved for NASAL specimens only), is one component of a comprehensive MRSA colonization surveillance program. It is not intended to diagnose MRSA infection nor to guide or monitor treatment for MRSA infections. Test performance is not FDA approved in patients less than 49 years old. Performed at [facility], [address] [place], [place] [address]     Radiology Studies: No results found.  Scheduled Meds:  Chlorhexidine Gluconate Cloth  6 each Topical Daily   enoxaparin (LOVENOX) injection  40 mg Subcutaneous Q24H   folic acid  1 mg Oral Daily   multivitamin with minerals  1 tablet Oral Daily   nicotine  14 mg Transdermal Daily   PHENObarbital  65 mg Intravenous Q8H   Followed by   [START ON 06/12/2023] PHENObarbital  32.5 mg Intravenous Q8H   sodium chloride flush  3 mL Intravenous Q12H   Continuous Infusions:  0.9 % NaCl with KCl 20 mEq / L 100 mL/hr at 06/11/23 1131   dexmedetomidine (PRECEDEX) IV infusion 0.6 mcg/kg/hr (06/11/23 1131)   thiamine (VITAMIN B1) injection 250 mg (06/11/23 1131)    LOS: 2 days    Marguerita Merles, DO [facility] Available via Epic secure chat 7am-7pm After these hours, please refer to coverage provider listed on [email or web address] 06/11/2023, 12:52 PM

## 2023-06-11 NOTE — Progress Notes (Signed)
PT Cancellation Note  Patient Details Name: [name] MRN: [id number] DOB: 1971/05/13   Cancelled Treatment:    Reason Eval/Treat Not Completed: Medical issues which prohibited therapy, patient calm and resting. Will check back tomorrow. [name] PT Acute Rehabilitation Services Office [phone] Weekend [phone]    Rada Hay 06/11/2023, 3:41 PM

## 2023-06-11 NOTE — Plan of Care (Signed)
  Problem: Health Behavior/Discharge Planning: Goal: Ability to manage health-related needs will improve Outcome: Not Progressing   

## 2023-06-11 NOTE — TOC Initial Note (Addendum)
Transition of Care [facility]) - Initial/Assessment Note    Patient Details  Name: [name] MRN: [id number] Date of Birth: July 14, 1971  Transition of [facility]) CM/SW Contact:    Lavenia Atlas, RN Phone Number: 06/11/2023, 1:13 PM  Clinical Narrative:    Late documentation:  Received TOC consult for SA resources. Per chart review request for initiation of IVC was started. This RNCM called [employer] to serve, awaiting completion to send to magistrate office. Attached SA resources to AVS.  TOC will continue to follow.  - 1:50pm IVC process completed, paperwork in patient's chart.     Expected Discharge Plan: (P)  (unknown at this time) Barriers to Discharge: Continued Medical Work up   Patient Goals and CMS Choice Patient states their goals for this hospitalization and ongoing recovery are:: to get help CMS Medicare.gov Compare Post Acute Care list provided to:: Patient Choice offered to / list presented to : Patient [place] ownership interest in [facility].provided to:: Patient    Expected Discharge Plan and Services In-house Referral: NA Discharge Planning Services: CM Consult Post Acute Care Choice:  (inpt psych) Living arrangements for the past 2 months: Single Family Home                 DME Arranged: N/A DME Agency: NA       HH Arranged: NA HH Agency: NA        Prior Living Arrangements/Services Living arrangements for the past 2 months: Single Family Home Lives with:: Relatives Patient language and need for interpreter reviewed:: Yes Do you feel safe going back to the place where you live?: Yes      Need for Family Participation in Patient Care: No (Comment) Care giver support system in place?: Yes (comment) Current home services: Other (comment) (none) Criminal Activity/Legal Involvement Pertinent to Current Situation/Hospitalization: No - Comment as needed  Activities of Daily Living      Permission Sought/Granted Permission sought to  share information with : Case Manager Permission granted to share information with : Yes, Verbal Permission Granted  Share Information with NAME: Case manager           Emotional Assessment Appearance:: Appears stated age Attitude/Demeanor/Rapport: Unable to Assess Affect (typically observed): Unable to Assess   Alcohol / Substance Use: Alcohol Use, Illicit Drugs (THC) Psych Involvement: No (comment)  Admission diagnosis:  Alcohol withdrawal (HCC) [F10.939] Alcohol abuse [F10.10] Patient Active Problem List   Diagnosis Date Noted   Alcohol use disorder, severe, in controlled environment (HCC) 03/25/2023   DTs (delirium tremens) (HCC) 03/25/2023   PTSD (post-traumatic stress disorder) 03/24/2023   Alcohol abuse with alcohol-induced mood disorder (HCC) 11/07/2018   MDD (major depressive disorder), recurrent severe, without psychosis (HCC) 02/17/2018   Alcohol use disorder, severe, dependence (HCC) 12/11/2017   Alcohol withdrawal (HCC) 12/10/2017   Seizure due to alcohol withdrawal (HCC) 08/30/2017   Alcohol withdrawal seizure (HCC) 08/30/2017   Tobacco abuse    Delirium tremens (HCC) 03/02/2017   GI bleed 10/06/2013   Marijuana abuse 10/06/2013   Suicidal ideation 10/05/2013   Alcohol abuse 10/26/2011   Depression 10/26/2011   PCP:  [facility] Pharmacy:   [facility] - [place], [place] - [address] [place] [address] Phone: [phone] Fax: [phone]     Social Determinants of Health (SDOH) Social History: SDOH Screenings   Alcohol Screen: Medium Risk (02/16/2018)  Social Connections: Unknown (01/13/2023)   Received from [facility],  [facility]  Tobacco Use: High Risk (06/09/2023)   SDOH Interventions:     Readmission Risk Interventions    06/11/2023   12:22 PM  Readmission Risk Prevention Plan  Transportation Screening Complete  PCP or Specialist Appt within  5-7 Days Complete  Home Care Screening Complete  Medication Review (RN CM) Complete

## 2023-06-11 NOTE — TOC Initial Note (Signed)
Transition of Care [facility]) - Initial/Assessment Note    Patient Details  Name: [name] MRN: [id number] Date of Birth: 04-11-1971  Transition of Care [facility]) CM/SW Contact:    Larrie Kass, LCSW Phone Number: 06/11/2023, 10:37 AM  Clinical Narrative:         CSW received consult for IVC, CSW has initiated IVC process. TOC to follow.   Expected Discharge Plan: [facility] Hospital Barriers to Discharge: Continued Medical Work up   Patient Goals and CMS Choice            Expected Discharge Plan and Services                                              Prior Living Arrangements/Services                       Activities of Daily Living      Permission Sought/Granted                  Emotional Assessment              Admission diagnosis:  Alcohol withdrawal (HCC) [F10.939] Alcohol abuse [F10.10] Patient Active Problem List   Diagnosis Date Noted   Alcohol use disorder, severe, in controlled environment (HCC) 03/25/2023   DTs (delirium tremens) (HCC) 03/25/2023   PTSD (post-traumatic stress disorder) 03/24/2023   Alcohol abuse with alcohol-induced mood disorder (HCC) 11/07/2018   MDD (major depressive disorder), recurrent severe, without psychosis (HCC) 02/17/2018   Alcohol use disorder, severe, dependence (HCC) 12/11/2017   Alcohol withdrawal (HCC) 12/10/2017   Seizure due to alcohol withdrawal (HCC) 08/30/2017   Alcohol withdrawal seizure (HCC) 08/30/2017   Tobacco abuse    Delirium tremens (HCC) 03/02/2017   GI bleed 10/06/2013   Marijuana abuse 10/06/2013   Suicidal ideation 10/05/2013   Alcohol abuse 10/26/2011   Depression 10/26/2011   PCP:  [facility] Pharmacy:   [facility] - [place], [place] - [address] [place] [address] Phone: [phone] Fax: [phone]     Social Determinants of Health  (SDOH) Social History: SDOH Screenings   Alcohol Screen: Medium Risk (02/16/2018)  Social Connections: Unknown (01/13/2023)   Received from [facility]  Tobacco Use: High Risk (06/09/2023)   SDOH Interventions:     Readmission Risk Interventions     No data to display

## 2023-06-11 NOTE — Progress Notes (Signed)
PT Cancellation Note  Patient Details Name: [name] MRN: [id number] DOB: May 28, 1971   Cancelled Treatment:    Reason Eval/Treat Not Completed: Patient's level of consciousness, per RN, check back later today.  [name] PT Acute Rehabilitation Services Office [phone] Weekend [phone]    Rada Hay 06/11/2023, 9:44 AM

## 2023-06-11 NOTE — Progress Notes (Signed)
OT Cancellation Note  Patient Details Name: [name] MRN: [id number] DOB: 1971-06-24   Cancelled Treatment:    Reason Eval/Treat Not Completed: Medical issues which prohibited therapy. Nurse requested to hold therapy eval in a.m. due to pt with prior episode of agitation then sedation. In p.m., pt was noted to be sleeping soundly. Will follow-up next day, as able and appropriate.    Reuben Likes, OTR/L 06/11/2023, 3:46 PM

## 2023-06-11 NOTE — Consult Note (Signed)
[facility] Face-to-Face Psychiatry Consult   Reason for Consult:  Suicidal ideations Referring Physician:  Dr. Marland [name] Patient Identification: [name] MRN:  [id number] Principal Diagnosis: Alcohol withdrawal (HCC) Diagnosis:  Principal Problem:   Alcohol withdrawal (HCC)  Total Time Spent in Direct Patient Care:  I personally spent 40 minutes on the unit in direct patient care. The direct patient care time included face-to-face time with the patient, reviewing the patient's chart, communicating with other professionals, and coordinating care. Greater than 50% of this time was spent in counseling or coordinating care with the patient regarding goals of hospitalization, psycho-education, and discharge planning needs.   HPI:   [name] is a Dennis y.o. male patient admitted with alcohol withdrawal.  From initial psychiatric evaluation 06/10/2023: He reports that his alcohol consumption increased approximately seven months ago, subsequent to becoming a 100% service-connected veteran. Over the past two months, his drinking has escalated significantly, though he is unable to quantify the financial impact, only indicating that it is substantial. According to both the patient and confirmation from his girlfriend, he consumes about 36 beers daily. The patient acknowledges experiencing alcohol-related complications, including delirium, tremors, and hallucinations. However, he denies any seizure activity. A brief suicide screening was conducted; he does not recall making any suicidal statements with intent but admits to a history of suicidal ideations and statements while under the influence of alcohol. No suicide attempts were reported, and he denies any active suicidal ideations at the current time. Given his current medical instability, a more thorough assessment for suicidality will be deferred until he is medically stabilized and more oriented. This approach is necessary to ensure an accurate  understanding of his mental health needs and risk factors. Continuous monitoring and supportive interventions will be maintained to manage his symptoms of delirium and to prepare for further psychiatric evaluation once his condition improves.  9/5: On evaluation today, patient is just getting back into bed after morning care.  He is awake, alert, and oriented.  He states that he typically gets his medical and mental health services through the [place].  He has previously been on trazodone and Abilify for sleep, which have been ineffective.  He is not interested in taking psychotropic medications.  Patient states that he has difficulty sleeping due to having nightmares and vivid dreams.  He also states that he sleepwalks and reenacts his dreams.  Patient does not recall if he has previously been on prazosin, but does not want to start new medications at this time.  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  Patient states that he has been to residential rehabilitation programs 10-15 times, but always relapses after he discharges.  He is not interested in going back to a residential rehabilitation program at this time.  He would be interested in going to a substance abuse intensive outpatient program if there is one available to him.  Patient states that he does living [place].  He reports a good support system to include his significant other and his [name].  He denies any DUIs or court dates pending.  Patient denies access to weapons.  He specifically denies any suicidal ideation, plan, or intent.  He denies any homicidal ideation.  He denies any auditory or visual hallucinations.   Past Psychiatric History:  52 year old man who comes to the hospital frequently for detox and sometimes leaves AGAINST MEDICAL ADVICE again admitted for medically supervised.moderate-severe risk of alcohol  withdrawal symptoms that could not be  safely managed as an outpatient.  Risk to Self:  Yes, related to alcoholism Risk to Others:  Denies Prior Inpatient Therapy:  Denies [facility]  [facility] Medical History:  Past Medical History:  Diagnosis Date   Active smoker    Alcohol abuse    Anxiety    Bipolar 2 disorder (HCC)    Depression    PTSD (post-traumatic stress disorder)     Past Surgical History:  Procedure Laterality Date   LYMPH NODE DISSECTION     Family History:  Family History  Problem Relation Age of Onset   Alcohol abuse [name]    Family Psychiatric  History: Denies  Social History:  Social History   Substance and Sexual Activity  Alcohol Use Yes   Comment: Patient states he is a binge drinker.     Social History   Substance and Sexual Activity  Drug Use Yes   Types: Marijuana    Social History   Socioeconomic History   Marital status: Legally Separated    Spouse name: Not on file   Number of children: Not on file   Years of education: Not on file   Highest education level: Not on file  Occupational History   Not on file  Tobacco Use   Smoking status: Every Day    Current packs/day: 2.00    Average packs/day: 2.0 packs/day for 20.0 years (40.0 ttl pk-yrs)    Types: Cigarettes   Smokeless tobacco: Never  Vaping Use   Vaping status: Never Used  Substance and Sexual Activity   Alcohol use: Yes    Comment: Patient states he is a binge drinker.   Drug use: Yes    Types: Marijuana   Sexual activity: Yes  Other Topics Concern   Not on file  Social History Narrative   Has an ex-wife and [name] whom he cannot see now, as his ex-wife divorced him. Was in the [employer] and went to [place], [place], and may have some PTSD symptoms from this. Was also a Management consultant. Heavy alcohol abuse during binges, but has had periods of sobriety for months on end. Has been drinking up to two cases of beer a day   Social Determinants of Health   Financial Resource  Strain: Not on file  Food Insecurity: Not on file  Transportation Needs: Not on file  Physical Activity: Not on file  Stress: Not on file  Social Connections: Unknown (01/13/2023)   [facility]   Social Network    Social Network: Not on file   Additional Social History:    Allergies:   Allergies  Allergen Reactions   Effexor [Venlafaxine] Hives   Nicoderm [Nicotine] Rash   Paxil [Paroxetine Hcl] Hives and Rash    Labs:  Results for orders placed or performed during the hospital encounter of 06/09/23 (from the past 48 hour(s))  MRSA Next Gen by PCR, Nasal     Status: None   Collection Time: 06/09/23 10:02 PM   Specimen: Nasal Mucosa; Nasal Swab  Result Value Ref Range   MRSA by PCR Next Gen NOT DETECTED NOT DETECTED    Comment: (NOTE) The GeneXpert MRSA Assay (FDA approved for NASAL specimens only), is one component of a comprehensive MRSA colonization surveillance program. It is not intended to diagnose MRSA infection nor to guide or monitor treatment for MRSA infections. Test performance is not FDA approved in patients  less than 55 years old. Performed at [facility], [address], [place], [place] [address]   Magnesium     Status: None   Collection Time: 06/10/23  2:59 AM  Result Value Ref Range   Magnesium 2.2 1.7 - 2.4 mg/dL    Comment: Performed at [facility], [address], [place], [place] [address]  Phosphorus     Status: None   Collection Time: 06/10/23  2:59 AM  Result Value Ref Range   Phosphorus 3.9 2.5 - 4.6 mg/dL    Comment: Performed at [facility], [address] [place], [place] [address]  CBC     Status: None   Collection Time: 06/10/23  2:59 AM  Result Value Ref Range   WBC 7.6 4.0 - 10.5 K/uL   RBC 5.29 4.22 - 5.81 MIL/uL   Hemoglobin 14.4 13.0 - 17.0 g/dL   HCT 62.3 76.2 - 83.1 %   MCV 82.4 80.0 - 100.0 fL   MCH 27.2 26.0 - 34.0 pg   MCHC 33.0 30.0  - 36.0 g/dL   RDW Dennis.7 Dennis.6 - 07.3 %   Platelets 238 150 - 400 K/uL   nRBC 0.0 0.0 - 0.2 %    Comment: Performed at [facility], [address] [place], [place] [address]  Comprehensive metabolic panel     Status: Abnormal   Collection Time: 06/10/23  2:59 AM  Result Value Ref Range   Sodium 140 135 - 145 mmol/L   Potassium 3.8 3.5 - 5.1 mmol/L   Chloride 101 98 - 111 mmol/L   CO2 26 22 - 32 mmol/L   Glucose, Bld 86 70 - 99 mg/dL    Comment: Glucose reference range applies only to samples taken after fasting for at least 8 hours.   BUN 9 6 - 20 mg/dL   Creatinine, Ser 6.94 0.Dennis - 1.24 mg/dL   Calcium 8.3 (L) 8.9 - 10.3 mg/dL   Total Protein 7.0 6.5 - 8.1 g/dL   Albumin 3.7 3.5 - 5.0 g/dL   AST 39 15 - 41 U/L   ALT 33 0 - 44 U/L   Alkaline Phosphatase 53 38 - 126 U/L   Total Bilirubin 0.9 0.3 - 1.2 mg/dL   GFR, Estimated >85 >46 mL/min    Comment: (NOTE) Calculated using the CKD-EPI Creatinine Equation (2021)    Anion gap 13 5 - 15    Comment: Performed at [facility], [address] [place], [place] [address]  Folate     Status: None   Collection Time: 06/10/23  7:57 AM  Result Value Ref Range   Folate 33.4 >5.9 ng/mL    Comment: RESULT CONFIRMED BY MANUAL DILUTION Performed at [facility], [address] [place], [place] [address]   TSH     Status: None   Collection Time: 06/10/23  7:57 AM  Result Value Ref Range   TSH 1.119 0.350 - 4.500 uIU/mL    Comment: Performed by a 3rd Generation assay with a functional sensitivity of <=0.01 uIU/mL. Performed at [facility], [address], [place], [place] [address]   Vitamin B12     Status: None   Collection Time: 06/10/23  7:57 AM  Result Value Ref Range   Vitamin B-12 204 180 - 914 pg/mL    Comment: (NOTE) This assay is not validated for testing neonatal or myeloproliferative syndrome specimens for Vitamin B12 levels. Performed at [facility], [address] [place], [place] [address]   Ammonia     Status: None   Collection Time: 06/10/23 10:36 AM  Result Value Ref Range   Ammonia 25 9 - 35 umol/L    Comment: Performed at [facility], [address], [place], [place] [address]  Comprehensive metabolic panel     Status: Abnormal   Collection Time: 06/11/23  3:00 AM  Result Value Ref Range   Sodium 138 135 - 145 mmol/L   Potassium 3.8 3.5 - 5.1 mmol/L   Chloride 106 98 - 111 mmol/L   CO2 23 22 - 32 mmol/L   Glucose, Bld 118 (H) 70 - 99 mg/dL    Comment: Glucose reference range applies only to samples taken after fasting for at least 8 hours.   BUN 11 6 - 20 mg/dL   Creatinine, Ser 2.95 0.Dennis - 1.24 mg/dL   Calcium 8.6 (L) 8.9 - 10.3 mg/dL   Total Protein 6.7 6.5 - 8.1 g/dL   Albumin 3.3 (L) 3.5 - 5.0 g/dL   AST 37 15 - 41 U/L   ALT 30 0 - 44 U/L   Alkaline Phosphatase 55 38 - 126 U/L   Total Bilirubin 1.3 (H) 0.3 - 1.2 mg/dL   GFR, Estimated >62 >13 mL/min    Comment: (NOTE) Calculated using the CKD-EPI Creatinine Equation (2021)    Anion gap 9 5 - 15    Comment: Performed at [facility], [address], [place], [place] [address]  CBC with Differential/Platelet     Status: None   Collection Time: 06/11/23  3:00 AM  Result Value Ref Range   WBC 4.9 4.0 - 10.5 K/uL   RBC 4.92 4.22 - 5.81 MIL/uL   Hemoglobin 13.3 13.0 - 17.0 g/dL   HCT 84.6 96.2 - 95.2 %   MCV 82.3 80.0 - 100.0 fL   MCH 27.0 26.0 - 34.0 pg   MCHC 32.8 30.0 - 36.0 g/dL   RDW 84.1 32.4 - 40.1 %   Platelets 154 150 - 400 K/uL   nRBC 0.0 0.0 - 0.2 %   Neutrophils Relative % 52 %   Neutro Abs 2.6 1.7 - 7.7 K/uL   Lymphocytes Relative 38 %   Lymphs Abs 1.9 0.7 - 4.0 K/uL   Monocytes Relative 6 %   Monocytes Absolute 0.3 0.1 - 1.0 K/uL   Eosinophils Relative 3 %   Eosinophils Absolute 0.1 0.0 - 0.5 K/uL   Basophils Relative 1 %   Basophils Absolute 0.0 0.0 - 0.1 K/uL   Immature  Granulocytes 0 %   Abs Immature Granulocytes 0.01 0.00 - 0.07 K/uL    Comment: Performed at [facility], [address], [place], [place] [address]  Magnesium     Status: None   Collection Time: 06/11/23  3:00 AM  Result Value Ref Range   Magnesium 2.3 1.7 - 2.4 mg/dL    Comment: Performed at [facility], [address], [place], [place] [address]  Phosphorus     Status: None   Collection Time: 06/11/23  3:00 AM  Result Value Ref Range   Phosphorus 3.1 2.5 - 4.6 mg/dL    Comment: Performed at [facility], [address] [place], [place] [address]    Current Facility-Administered Medications  Medication Dose Route Frequency Provider Last Rate Last Admin   acetaminophen (TYLENOL) tablet 650 mg  650 mg Oral Q6H PRN Opyd, Marcial Pacas  S, MD       Or   acetaminophen (TYLENOL) suppository 650 mg  650 mg Rectal Q6H PRN Opyd, Lavone Neri, MD       Chlorhexidine Gluconate Cloth 2 % PADS 6 each  6 each Topical Daily Opyd, Lavone Neri, MD   6 each at 06/10/23 1300   dexmedetomidine (PRECEDEX) 400 MCG/100ML (4 mcg/mL) infusion  0.2-0.7 mcg/kg/hr Intravenous Continuous Marguerita Merles [place], DO 12.02 mL/hr at 06/11/23 1800 0.7 mcg/kg/hr at 06/11/23 1800   enoxaparin (LOVENOX) injection 40 mg  40 mg Subcutaneous Q24H Opyd, Lavone Neri, MD   40 mg at 06/10/23 2149   folic acid (FOLVITE) tablet 1 mg  1 mg Oral Daily Opyd, Lavone Neri, MD   1 mg at 06/11/23 1122   LORazepam (ATIVAN) tablet 1-4 mg  1-4 mg Oral Q1H PRN Opyd, Lavone Neri, MD       Or   LORazepam (ATIVAN) injection 1-4 mg  1-4 mg Intravenous Q1H PRN Opyd, Lavone Neri, MD   2 mg at 06/11/23 1742   multivitamin with minerals tablet 1 tablet  1 tablet Oral Daily Opyd, Lavone Neri, MD   1 tablet at 06/11/23 1122   nicotine (NICODERM CQ - dosed in mg/24 hours) patch 14 mg  14 mg Transdermal Daily Marguerita Merles [place], DO   14 mg at 06/11/23 1122   nicotine polacrilex (NICORETTE) gum 2 mg  2 mg Oral PRN  Marguerita Merles [place], DO       Oral care mouth rinse  15 mL Mouth Rinse PRN Opyd, Lavone Neri, MD       PHENObarbital (LUMINAL) injection 65 mg  65 mg Intravenous Q8H Maryagnes Amos, FNP   65 mg at 06/11/23 1632   Followed by   Melene Muller ON 06/12/2023] PHENObarbital (LUMINAL) injection 32.5 mg  32.5 mg Intravenous Q8H Starkes-Perry, Juel Burrow, FNP       prochlorperazine (COMPAZINE) injection 5 mg  5 mg Intravenous Q4H PRN Opyd, Lavone Neri, MD       senna-docusate (Senokot-S) tablet 1 tablet  1 tablet Oral QHS PRN Opyd, Lavone Neri, MD       sodium chloride flush (NS) 0.9 % injection 3 mL  3 mL Intravenous Q12H Opyd, Lavone Neri, MD   3 mL at 06/11/23 1641   thiamine (VITAMIN B1) 250 mg in sodium chloride 0.9 % 50 mL IVPB  250 mg Intravenous Q24H Maryagnes Amos, FNP   Stopped at 06/11/23 1201    Musculoskeletal: Strength & Muscle Tone: within normal limits Gait & Station: unsteady Patient leans: N/A  Psychiatric Specialty Exam:  Presentation  General Appearance:  Casual  Eye Contact: Fair  Speech: Normal Rate  Speech Volume: Normal  Handedness: Right   Mood and Affect  Mood: Dysphoric  Affect: Congruent   Thought Process  Thought Processes: Coherent; Goal Directed; Linear  Descriptions of Associations:Intact  Orientation:Full (Time, Place and Person)  Thought Content:Logical  History of Schizophrenia/Schizoaffective disorder:No data recorded Duration of Psychotic Symptoms:No data recorded Hallucinations:Hallucinations: None  Ideas of Reference:None  Suicidal Thoughts:Suicidal Thoughts: No  Homicidal Thoughts:Homicidal Thoughts: No   Sensorium  Memory: Immediate Good; Recent Fair; Remote Fair  Judgment: Poor  Insight: Shallow   Executive Functions  Concentration: Fair  Attention Span: Fair  Recall: Fair  Fund of Knowledge: Fair  Language: Good   Psychomotor Activity  Psychomotor Activity:Psychomotor Activity:  Decreased   Assets  Assets: Communication Skills; Desire for Improvement; Financial Resources/Insurance; Housing; Resilience; Social Support   Sleep  Sleep:Sleep: Poor   Physical Exam: Physical Exam Vitals and nursing note reviewed.  Constitutional:      General: He is awake. He is not in acute distress.    Appearance: He is normal weight.     Interventions: Nasal cannula in place.  Pulmonary:     Effort: Pulmonary effort is normal. No respiratory distress.  Neurological:     General: No focal deficit present.     Mental Status: He is oriented to person, place, and time and easily aroused. He is confused.     Cranial Nerves: Cranial nerves 2-12 are intact.     Deep Tendon Reflexes: Reflexes are normal and symmetric.    Review of Systems  Psychiatric/Behavioral:  Positive for substance abuse (ETOH). Negative for depression, hallucinations and suicidal ideas. The patient is not nervous/anxious.   All other systems reviewed and are negative.  Blood pressure 105/69, pulse 62, temperature (!) 97.4 F (36.3 C), temperature source Oral, resp. rate 16, height 5\' 10"  (1.778 m), weight 68.7 kg, SpO2 93%. Body mass index is 21.73 kg/m.  Treatment Plan Summary: Medication management  Alcohol detox:  -Continue CIWA protocol with ativan. Patient is a candidate for Phenobarb, recent CIWA scores > 30. Will likely require Dex, will leave that up to primary team.  -Labs obtained include B12(536>204), TSH (1.11), BAL (363), UDS (THC),   -Alcohol Delirium, improving:  --high dose IV thiamine, B1 obtained. Thiamine 500mg  po TID x 3 days.  -Phenobarb detox protocol --Haldol 2mg  q6hr IV for agitation/delirium -EKG 06/10/2023: QTC 414 within normal. -Continue safety sitter due to high risk for falls  While future psychiatric events cannot be accurately predicted, the patient does not currently require acute inpatient psychiatric care and does not currently meet [facility] involuntary  commitment criteria.  Patient will likely benefit from inpatient residential rehab at the [place].  Recommend TOC start process now to prevent further delay in transfer.  Psychiatry will sign off.  Please re-consult for any future acute psychiatric concerns.   Disposition:  Disposition is pending, Patient currently receiving multiple services at the [place]. Generally admitted once a month.   Mariel Craft, MD 06/11/2023 7:13 PM

## 2023-06-12 DIAGNOSIS — F10931 Alcohol use, unspecified with withdrawal delirium: Secondary | ICD-10-CM | POA: Diagnosis not present

## 2023-06-12 DIAGNOSIS — R7401 Elevation of levels of liver transaminase levels: Secondary | ICD-10-CM

## 2023-06-12 DIAGNOSIS — R45851 Suicidal ideations: Secondary | ICD-10-CM | POA: Diagnosis not present

## 2023-06-12 DIAGNOSIS — E876 Hypokalemia: Secondary | ICD-10-CM | POA: Diagnosis not present

## 2023-06-12 LAB — COMPREHENSIVE METABOLIC PANEL
ALT: 33 U/L (ref 0–44)
AST: 42 U/L — ABNORMAL HIGH (ref 15–41)
Albumin: 3.7 g/dL (ref 3.5–5.0)
Alkaline Phosphatase: 63 U/L (ref 38–126)
Anion gap: 9 (ref 5–15)
BUN: 10 mg/dL (ref 6–20)
CO2: 24 mmol/L (ref 22–32)
Calcium: 9.1 mg/dL (ref 8.9–10.3)
Chloride: 105 mmol/L (ref 98–111)
Creatinine, Ser: 0.61 mg/dL (ref 0.61–1.24)
GFR, Estimated: >60 mL/min (ref >60)
Glucose, Bld: 116 mg/dL — ABNORMAL HIGH (ref 70–99)
Potassium: 3.8 mmol/L (ref 3.5–5.1)
Sodium: 138 mmol/L (ref 135–145)
Total Bilirubin: 0.6 mg/dL (ref 0.3–1.2)
Total Protein: 7.4 g/dL (ref 6.5–8.1)

## 2023-06-12 LAB — CBC WITH DIFFERENTIAL/PLATELET
Abs Immature Granulocytes: 0.01 10*3/uL (ref 0.00–0.07)
Basophils Absolute: 0 10*3/uL (ref 0.0–0.1)
Basophils Relative: 1 %
Eosinophils Absolute: 0.3 10*3/uL (ref 0.0–0.5)
Eosinophils Relative: 5 %
HCT: 45.8 % (ref 39.0–52.0)
Hemoglobin: 15.3 g/dL (ref 13.0–17.0)
Immature Granulocytes: 0 %
Lymphocytes Relative: 35 %
Lymphs Abs: 1.9 10*3/uL (ref 0.7–4.0)
MCH: 27.2 pg (ref 26.0–34.0)
MCHC: 33.4 g/dL (ref 30.0–36.0)
MCV: 81.5 fL (ref 80.0–100.0)
Monocytes Absolute: 0.4 10*3/uL (ref 0.1–1.0)
Monocytes Relative: 7 %
Neutro Abs: 2.8 10*3/uL (ref 1.7–7.7)
Neutrophils Relative %: 52 %
Platelets: 159 10*3/uL (ref 150–400)
RBC: 5.62 MIL/uL (ref 4.22–5.81)
RDW: 13.5 % (ref 11.5–15.5)
WBC: 5.4 10*3/uL (ref 4.0–10.5)
nRBC: 0 % (ref 0.0–0.2)

## 2023-06-12 LAB — MAGNESIUM: Magnesium: 2.1 mg/dL (ref 1.7–2.4)

## 2023-06-12 LAB — PHOSPHORUS: Phosphorus: 3.9 mg/dL (ref 2.5–4.6)

## 2023-06-12 MED ORDER — LORAZEPAM 2 MG/ML IJ SOLN
1.0000 mg | INTRAMUSCULAR | Status: AC | PRN
  Administered 2023-06-12: 1 mg via INTRAVENOUS
  Administered 2023-06-12: 4 mg via INTRAVENOUS
  Administered 2023-06-13: 1 mg via INTRAVENOUS
  Administered 2023-06-13 (×5): 2 mg via INTRAVENOUS
  Filled 2023-06-12 (×7): qty 1

## 2023-06-12 MED ORDER — LORAZEPAM 1 MG PO TABS
1.0000 mg | ORAL_TABLET | ORAL | Status: AC | PRN
  Filled 2023-06-12: qty 2

## 2023-06-12 NOTE — Progress Notes (Signed)
PROGRESS NOTE    [name]  JXB:[id number] DOB: Feb 12, 1971 DOA: 06/09/2023 PCP: [facility]   Brief Narrative:  The Patient is a 52 year old Caucasian male with a past medical history significant for Brilinta anxiety, depression, PTSD, bipolar type II, alcohol abuse and dependence who presented to the ED seeking help for alcohol problem.  He had been consuming roughly about 25 beers a day and expressing suicidal ideations was brought in by his significant other.  Confirms feeling suicidal but was unable to elaborate and subsequently started withdrawing.  Started having some hallucinations.  Upon arrival to ED he is afebrile saturating low 90s on room air with an elevated heart rate and stable blood pressure.  His alcohol level on admission was 383.  He was given 1 L of LR and 2 mg of IV Haldol and 2 mg IV Ativan and thiamine in the ED.  Subsequently he was placed on a one-to-one sitter and psychiatry was consulted.  Psychiatry has now placed him on a phenobarbital taper as he continues to receive the stepdown/ICU alcohol withdrawal protocol with CIWA and lorazepam but given his continued withdrawal will place on a Precedex gtt.   Patient continued to be extremely agitated and started becoming combative so Precedex drip was being titrated.  Given concern for his safety and given his prior suicidal ideation a decision was made to involuntarily commit him until he is out of withdrawals to properly assess his psychiatric status.  He continues to be on max dose of the Precedex and continues to have intermittent agitation and also was on the phenobarbital taper.  If necessary will give him IV Haldol.  Assessment and Plan:  Alcohol Dependence and Intoxication with Withdrawal and Hallucinations  -Continued with CIWA Protocol and with scheduled lorazepam and as needed lorazepam for withdrawal symptoms however changed to Precedex as below -Patient's alcohol level on admission was  383 -Continue with multivitamin with minerals, folic acid and thiamine but will continue high-dose thiamine 500 mg 3 times daily for 3 doses and then 250 mg 3 times daily for 3 doses and then start p.o./IV 100 mg daily -IV fluid hydration has now stopped. -His ammonia level was 25 and his TSH was 1.119 -Given the severity of his Withdrawal Symptoms will start Dexmedetomidine 0.2-0.7 mcg and remains on max dose currently -Psychiatry evaluated and starting the patient on phenobarbital taper with IV 6 5 mg every 8 for 6 doses which he is already completed and has now been initiated on phenobarbital 32.5 mg every 8 for 6 more doses -Psychiatry feels that he does not need Inpatient Psychiatric Hospitalization but feel that he requires Inpatient Residential Rehab at the [place]   Suicidal Ideation  Hx of Bipolar Disorder, Anxiety and Depression, PTSD -Pt confirmed SI but is unable to contribute much else to the history in ED  -Suicide precautions for now and one-to-one sitter -Psychiatry evaluated and he denied any suicidal ideation to the psychiatry team yesterday.  Will continue one-to-one sitter just in case though given his agitation and hallucinations -Given his prior suicidal ideations and his attempt to leave AGAINST MEDICAL ADVICE will involuntarily commit him for now and get him out of withdrawals and then appropriately assess his psychiatric status and his suicidal ideations -Psychiatry recommending Haldol 2 mg every 6 IV for agitation and delirium -Psychiatry feels that he does not need Inpatient Psychiatric Hospitalization but feel that he requires Inpatient Residential Rehab at the [facility]   Hypokalemia -Patient's K+ Level Trend: Recent Labs  Lab 06/09/23 1825 06/10/23 0259 06/11/23 0300 06/12/23 0310  K 3.4* 3.8 3.8 3.8  -Replete with IV NaCl + 20 mEQ of KCL at 100 mL/hr x 20 hour again  -Continue to Monitor and Replete as Necessary -Repeat CMP in the AM   Elevated AST, mild -Mild and  Likely Reactive. LFT Trend: Recent Labs  Lab 06/09/23 1825 06/10/23 0259 06/11/23 0300 06/12/23 0310  AST 44* 39 37 42*  ALT 38 33 30 33  -Continue to Monitor and trend and repeat CMP in the AM   THC Positive -UDS + for THC -Counseling given -Will need continued monitoring  Tobacco Abuse -Smoking cessation counseling given and patient has been initiated on a nicotine transdermal patch 14 mg daily and nicotine polacrilex gum 2 mg as needed.   -Smoking cessation given  Hyperbilirubinemia -Mild. Bilirubin Trend: Recent Labs  Lab 06/09/23 1825 06/10/23 0259 06/11/23 0300 06/12/23 0310  BILITOT 0.7 0.9 1.3* 0.6  -Continue to Monitor and Trend and repeat CMP in the AM   Hypoalbuminemia -Patient's Albumin Trend: Recent Labs  Lab 06/09/23 1825 06/10/23 0259 06/11/23 0300 06/12/23 0310  ALBUMIN 4.4 3.7 3.3* 3.7  -Continue to Monitor and Trend and repeat CMP in the AM   DVT prophylaxis: enoxaparin (LOVENOX) injection 40 mg Start: 06/09/23 2200    Code Status: Full Code Family Communication: No family currently at bedside  Disposition Plan:  Level of care: Stepdown Status is: Inpatient Remains inpatient appropriate because: Recently clinical improvement from his withdrawals and to be weaned off of the Precedex drip and go to inpatient residential rehab at the [place]   Consultants:  Psychiatry  Procedures:  As delineated as above  Antimicrobials:  Anti-infectives (From admission, onward)    None       Subjective: Seen and examined at bedside and patient was doing okay.  Asking when he is going home.  Resting currently but was intermittently agitated.  On max dose Precedex currently but will need to be weaned.  Objective: Vitals:   06/12/23 1300 06/12/23 1400 06/12/23 1411 06/12/23 1500  BP: (!) 157/95 (!) 138/93  (!) 107/90  Pulse: (!) 49 61 62 72  Resp: 17 14 14 17   Temp:      TempSrc:      SpO2: 97% 95% 95% 96%  Weight:      Height:         Intake/Output Summary (Last 24 hours) at 06/12/2023 1656 Last data filed at 06/12/2023 1308 Gross per 24 hour  Intake 525.87 ml  Output 3625 ml  Net -3099.13 ml   Filed Weights   06/09/23 1753 06/09/23 2216  Weight: 81.5 kg 68.7 kg   Examination: Physical Exam:  Constitutional: WN/WD Caucasian male who is resting in no acute distress currently Respiratory: Diminished to auscultation bilaterally, no wheezing, rales, rhonchi or crackles. Normal respiratory effort and patient is not tachypenic. No accessory muscle use.  Unlabored breathing Cardiovascular: RRR, no murmurs / rubs / gallops. S1 and S2 auscultated. No extremity edema.  Abdomen: Soft, non-tender, non-distended. Bowel sounds positive.  GU: Deferred. Musculoskeletal: No clubbing / cyanosis of digits/nails. No joint deformity upper and lower extremities.  Skin: No rashes, lesions, ulcers limited skin evaluation. No induration; Warm and dry.  Neurologic: CN 2-12 grossly intact with no focal deficits. Romberg sign and cerebellar reflexes not assessed.  Psychiatric: Appears calmer today  Data Reviewed: I have personally reviewed following labs and imaging studies  CBC: Recent Labs  Lab 06/09/23 1825 06/10/23 0259 06/11/23  0300 06/12/23 0310  WBC 7.4 7.6 4.9 5.4  NEUTROABS 2.5  --  2.6 2.8  HGB 16.4 14.4 13.3 15.3  HCT 48.8 43.6 40.5 45.8  MCV 80.4 82.4 82.3 81.5  PLT 236 238 154 159   Basic Metabolic Panel: Recent Labs  Lab 06/09/23 1825 06/10/23 0259 06/11/23 0300 06/12/23 0310  NA 140 140 138 138  K 3.4* 3.8 3.8 3.8  CL 101 101 106 105  CO2 25 26 23 24   GLUCOSE 103* 86 118* 116*  BUN 8 9 11 10   CREATININE 0.75 0.63 0.77 0.61  CALCIUM 8.5* 8.3* 8.6* 9.1  MG  --  2.2 2.3 2.1  PHOS  --  3.9 3.1 3.9   GFR: Estimated Creatinine Clearance: 106.2 mL/min (by C-G formula based on SCr of 0.61 mg/dL). Liver Function Tests: Recent Labs  Lab 06/09/23 1825 06/10/23 0259 06/11/23 0300 06/12/23 0310  AST 44*  39 37 42*  ALT 38 33 30 33  ALKPHOS 60 53 55 63  BILITOT 0.7 0.9 1.3* 0.6  PROT 8.4* 7.0 6.7 7.4  ALBUMIN 4.4 3.7 3.3* 3.7   No results for input(s): "LIPASE", "AMYLASE" in the last 168 hours. Recent Labs  Lab 06/10/23 1036  AMMONIA 25   Coagulation Profile: No results for input(s): "INR", "PROTIME" in the last 168 hours. Cardiac Enzymes: No results for input(s): "CKTOTAL", "CKMB", "CKMBINDEX", "TROPONINI" in the last 168 hours. BNP (last 3 results) No results for input(s): "PROBNP" in the last 8760 hours. HbA1C: No results for input(s): "HGBA1C" in the last 72 hours. CBG: No results for input(s): "GLUCAP" in the last 168 hours. Lipid Profile: No results for input(s): "CHOL", "HDL", "LDLCALC", "TRIG", "CHOLHDL", "LDLDIRECT" in the last 72 hours. Thyroid Function Tests: Recent Labs    06/10/23 0757  TSH 1.119   Anemia Panel: Recent Labs    06/10/23 0757  VITAMINB12 204  FOLATE 33.4   Sepsis Labs: No results for input(s): "PROCALCITON", "LATICACIDVEN" in the last 168 hours.  Recent Results (from the past 240 hour(s))  MRSA Next Gen by PCR, Nasal     Status: None   Collection Time: 06/09/23 10:02 PM   Specimen: Nasal Mucosa; Nasal Swab  Result Value Ref Range Status   MRSA by PCR Next Gen NOT DETECTED NOT DETECTED Final    Comment: (NOTE) The GeneXpert MRSA Assay (FDA approved for NASAL specimens only), is one component of a comprehensive MRSA colonization surveillance program. It is not intended to diagnose MRSA infection nor to guide or monitor treatment for MRSA infections. Test performance is not FDA approved in patients less than 53 years old. Performed at [facility], [address], [place], [place] [address]     Radiology Studies: No results found.  Scheduled Meds:  Chlorhexidine Gluconate Cloth  6 each Topical Daily   enoxaparin (LOVENOX) injection  40 mg Subcutaneous Q24H   folic acid  1 mg Oral Daily   multivitamin with  minerals  1 tablet Oral Daily   nicotine  14 mg Transdermal Daily   PHENObarbital  32.5 mg Intravenous Q8H   sodium chloride flush  3 mL Intravenous Q12H   Continuous Infusions:  dexmedetomidine (PRECEDEX) IV infusion 0.7 mcg/kg/hr (06/12/23 1411)   thiamine (VITAMIN B1) injection Stopped (06/12/23 1308)    LOS: 3 days   Marguerita Merles, DO [facility] Available via Epic secure chat 7am-7pm After these hours, please refer to coverage provider listed on [email or web address] 06/12/2023, 4:56 PM

## 2023-06-12 NOTE — Evaluation (Signed)
Occupational Therapy Evaluation Patient Details Name: [name] MRN: [id number] DOB: Feb 22, 1971 Today's Date: 06/12/2023   History of Present Illness [name] is a 52 yr old male admitted to the hospital due to ETOH withdrawal and alcohol delirium; per his medical chart, he had been consuming ~25 beers a day. PMH: anxiety, depression, PTSD, bipolar disorder, ETOH abuse   Clinical Impression   The pt is currently presenting slightly below his baseline level of functioning for self-care management, given the below listed deficits (see OT problem list). During the therapy session, he required CGA for most tasks, including lower body dressing, sit to stand, and ambulating in the hall without an assistive device. He reported feeling as though his legs are fatigued. He also appeared to be with mild deconditioning. He became tearful near the end of the session, which appeared to be related to his perceived current physical limitations, as he stated he used to be able to run ~25 miles. He will benefit from OT services in the acute care setting to maximize his independence with self-care tasks & to facilitate his safe hospital discharge. OT anticipates he will only require 1-2 additional OT visits during his hospital stay.      If plan is discharge home, recommend the following: Assist for transportation    Functional Status Assessment  Patient has had a recent decline in their functional status and demonstrates the ability to make significant improvements in function in a reasonable and predictable amount of time.  Equipment Recommendations  None recommended by OT    Recommendations for Other Services       Precautions / Restrictions Precautions Precautions: Fall Restrictions Weight Bearing Restrictions: No      Mobility Bed Mobility Overal bed mobility: Needs Assistance Bed Mobility: Supine to Sit, Sit to Supine     Supine to sit: Supervision Sit to supine: Supervision         Transfers Overall transfer level: Needs assistance Equipment used: None Transfers: Sit to/from Stand Sit to Stand: Contact guard assist                  Balance     Sitting balance-Leahy Scale: Good         Standing balance comment: CGA without an assistive device            ADL either performed or assessed with clinical judgement   ADL Overall ADL's : Independent Eating/Feeding: Sitting Eating/Feeding Details (indicate cue type and reason): based on clinical judgement Grooming: Set up;Contact guard assist Grooming Details (indicate cue type and reason): Set-up seated or CGA standing         Upper Body Dressing : Set up;Sitting   Lower Body Dressing: Contact guard assist;Sit to/from stand   Toilet Transfer: Contact guard assist;Ambulation Toilet Transfer Details (indicate cue type and reason): at bathroom level, based on clinical judgement                              Pertinent Vitals/Pain Pain Assessment Pain Assessment: No/denies pain     Extremity/Trunk Assessment Upper Extremity Assessment Upper Extremity Assessment: Overall WFL for tasks assessed (AROM WFL. BUE grip strength 4+/5)   Lower Extremity Assessment Lower Extremity Assessment: Overall WFL for tasks assessed       Communication Communication Communication: No apparent difficulties   Cognition Arousal: Alert            General Comments: Oriented to person, place, and  year. Disoriented to month. Able to follow 1-2 step commands consistently. Patient became tearful near the end of the session which appeared to be related to his perceived physical limitations, as he stated he used to be able to run ~25 miles.                Home Living Family/patient expects to be discharged to:: Private residence Living Arrangements: Alone   Type of Home: House Home Access: Stairs to enter Secretary/administrator of Steps: 2   Home Layout: One level                Home Equipment: None          Prior Functioning/Environment Prior Level of Function : Independent/Modified Independent;Driving             Mobility Comments: He was independent with ambulation. ADLs Comments: He was independent with ADLs, cooking, and cleaning.        OT Problem List: Impaired balance (sitting and/or standing);Decreased safety awareness;Decreased activity tolerance      OT Treatment/Interventions: Self-care/ADL training;Therapeutic exercise;DME and/or AE instruction;Therapeutic activities;Balance training;Patient/family education    OT Goals(Current goals can be found in the care plan section) Acute Rehab OT Goals Patient Stated Goal: he did not specifically state OT Goal Formulation: With patient Time For Goal Achievement: 06/26/23 Potential to Achieve Goals: Good ADL Goals Pt Will Perform Grooming: Independently;standing Pt Will Perform Lower Body Dressing: Independently;sit to/from stand Pt Will Transfer to Toilet: Independently;ambulating Pt Will Perform Toileting - Clothing Manipulation and hygiene: Independently;sit to/from stand  OT Frequency: Min 1X/week    Co-evaluation PT/OT/SLP Co-Evaluation/Treatment: Yes Reason for Co-Treatment: To address functional/ADL transfers PT goals addressed during session: Mobility/safety with mobility OT goals addressed during session: ADL's and self-care      AM-PAC OT "6 Clicks" Daily Activity     Outcome Measure Help from another person eating meals?: None Help from another person taking care of personal grooming?: A Little Help from another person toileting, which includes using toliet, bedpan, or urinal?: None Help from another person bathing (including washing, rinsing, drying)?: A Little Help from another person to put on and taking off regular upper body clothing?: A Little Help from another person to put on and taking off regular lower body clothing?: A Little 6 Click Score: 20   End of Session  Equipment Utilized During Treatment: Gait belt Nurse Communication: Other (comment) (nurse cleared pt for participation in therapy)  Activity Tolerance: Patient tolerated treatment well Patient left: in bed;with call bell/phone within reach;with bed alarm set;with nursing/sitter in room  OT Visit Diagnosis: Unsteadiness on feet (R26.81)                Time: 4098-1191 OT Time Calculation (min): 13 min Charges:  OT General Charges $OT Visit: 1 Visit OT Evaluation $OT Eval Low Complexity: 1 Low    Melecio Cueto L Shelonda Saxe, OTR/L 06/12/2023, 1:48 PM

## 2023-06-12 NOTE — Evaluation (Signed)
Physical Therapy Evaluation Patient Details Name: [name] MRN: [id number] DOB: March 20, 1971 Today's Date: 06/12/2023  History of Present Illness  Mr. Onate is a 52 yr old male admitted to the hospital due to ETOH withdrawal and alcohol delirium; per his medical chart, he had been consuming ~25 beers a day.  Additionally, pt with suicidal ideations. PMH: anxiety, depression, PTSD, bipolar disorder, ETOH abuse  Clinical Impression  Pt admitted with above diagnosis. At baseline, pt independent.  Today, pt presenting with mild unsteadiness and decreased safety awareness.  He ambulated 200' with CGA.  He is expected to progress well with therapy and likely no needs at d/c.  Pt currently with functional limitations due to the deficits listed below (see PT Problem List). Pt will benefit from acute skilled PT to increase their independence and safety with mobility to allow discharge.           If plan is discharge home, recommend the following: A little help with walking and/or transfers;A little help with bathing/dressing/bathroom;Assistance with cooking/housework;Help with stairs or ramp for entrance   Can travel by private vehicle        Equipment Recommendations None recommended by PT  Recommendations for Other Services       Functional Status Assessment Patient has had a recent decline in their functional status and demonstrates the ability to make significant improvements in function in a reasonable and predictable amount of time.     Precautions / Restrictions Precautions Precautions: Fall Restrictions Weight Bearing Restrictions: No      Mobility  Bed Mobility Overal bed mobility: Needs Assistance Bed Mobility: Supine to Sit, Sit to Supine     Supine to sit: Supervision Sit to supine: Supervision        Transfers Overall transfer level: Needs assistance Equipment used: None Transfers: Sit to/from Stand Sit to Stand: Contact guard assist                 Ambulation/Gait Ambulation/Gait assistance: Contact guard assist Gait Distance (Feet): 200 Feet Assistive device: None Gait Pattern/deviations: Drifts right/left, Step-through pattern Gait velocity: decreased     General Gait Details: Pt with hands in mid guard and with mild unsteadiness  Stairs            Wheelchair Mobility     Tilt Bed    Modified Rankin (Stroke Patients Only)       Balance Overall balance assessment: Needs assistance Sitting-balance support: No upper extremity supported Sitting balance-Leahy Scale: Good     Standing balance support: No upper extremity supported Standing balance-Leahy Scale: Fair                               Pertinent Vitals/Pain Pain Assessment Pain Assessment: No/denies pain    Home Living Family/patient expects to be discharged to:: Private residence Living Arrangements: Alone   Type of Home: House Home Access: Stairs to enter   Secretary/administrator of Steps: 2   Home Layout: One level Home Equipment: None      Prior Function Prior Level of Function : Independent/Modified Independent;Driving             Mobility Comments: He was independent with ambulation. ADLs Comments: He was independent with ADLs, cooking, and cleaning.     Extremity/Trunk Assessment   Upper Extremity Assessment Upper Extremity Assessment: Overall WFL for tasks assessed    Lower Extremity Assessment Lower Extremity Assessment: LLE deficits/detail;RLE deficits/detail RLE Deficits /  Details: ROM WFL; MMT 5/5 LLE Deficits / Details: ROM WFL, MMT 5/5    Cervical / Trunk Assessment Cervical / Trunk Assessment: Normal  Communication   Communication Communication: No apparent difficulties  Cognition Arousal: Alert Behavior During Therapy: Lability Overall Cognitive Status: Impaired/Different from baseline                                 General Comments: Oriented to person, place, and year.  Disoriented to month. Able to follow 1-2 step commands consistently. Patient tearful throughout session (stating "heart hurts" , "how did I get here")and increasd near the end of the session which appeared to be related to his perceived physical limitations, as he stated he used to be able to run ~25 miles.        General Comments      Exercises     Assessment/Plan    PT Assessment Patient needs continued PT services  PT Problem List Decreased cognition;Decreased activity tolerance;Decreased balance;Decreased safety awareness;Decreased mobility       PT Treatment Interventions DME instruction;Therapeutic exercise;Gait training;Balance training;Stair training;Neuromuscular re-education;Functional mobility training;Therapeutic activities;Patient/family education;Cognitive remediation    PT Goals (Current goals can be found in the Care Plan section)  Acute Rehab PT Goals Patient Stated Goal: regain strength PT Goal Formulation: With patient Time For Goal Achievement: 06/26/23 Potential to Achieve Goals: Good Additional Goals Additional Goal #1: Will score >19 on DGI to indicate low fall risk    Frequency Min 1X/week     Co-evaluation   Reason for Co-Treatment: To address functional/ADL transfers PT goals addressed during session: Mobility/safety with mobility OT goals addressed during session: ADL's and self-care       AM-PAC PT "6 Clicks" Mobility  Outcome Measure Help needed turning from your back to your side while in a flat bed without using bedrails?: None Help needed moving from lying on your back to sitting on the side of a flat bed without using bedrails?: A Little Help needed moving to and from a bed to a chair (including a wheelchair)?: A Little Help needed standing up from a chair using your arms (e.g., wheelchair or bedside chair)?: A Little Help needed to walk in hospital room?: A Little Help needed climbing 3-5 steps with a railing? : A Little 6 Click Score:  19    End of Session Equipment Utilized During Treatment: Gait belt Activity Tolerance: Patient tolerated treatment well Patient left: in bed;with call bell/phone within reach;Other (comment) (Sitter present, bed alarm not working for this pt(every time shifted weight would go off, left off)) Nurse Communication: Mobility status PT Visit Diagnosis: Unsteadiness on feet (R26.81)    Time: 2595-6387 PT Time Calculation (min) (ACUTE ONLY): 17 min   Charges:   PT Evaluation $PT Eval Low Complexity: 1 Low   PT General Charges $$ ACUTE PT VISIT: 1 Visit         Anise Salvo, PT Acute Rehab [facility] [phone]   Rayetta Humphrey 06/12/2023, 1:54 PM

## 2023-06-12 NOTE — Plan of Care (Signed)
  Problem: Education: Goal: Knowledge of General Education information will improve Description: Including pain rating scale, medication(s)/side effects and non-pharmacologic comfort measures Outcome: Not Progressing   

## 2023-06-13 DIAGNOSIS — R7401 Elevation of levels of liver transaminase levels: Secondary | ICD-10-CM | POA: Diagnosis not present

## 2023-06-13 DIAGNOSIS — F419 Anxiety disorder, unspecified: Secondary | ICD-10-CM

## 2023-06-13 DIAGNOSIS — F10931 Alcohol use, unspecified with withdrawal delirium: Secondary | ICD-10-CM | POA: Diagnosis not present

## 2023-06-13 DIAGNOSIS — R451 Restlessness and agitation: Secondary | ICD-10-CM

## 2023-06-13 DIAGNOSIS — F1014 Alcohol abuse with alcohol-induced mood disorder: Secondary | ICD-10-CM

## 2023-06-13 DIAGNOSIS — E876 Hypokalemia: Secondary | ICD-10-CM | POA: Diagnosis not present

## 2023-06-13 LAB — CBC WITH DIFFERENTIAL/PLATELET
Abs Immature Granulocytes: 0.01 10*3/uL (ref 0.00–0.07)
Basophils Absolute: 0 10*3/uL (ref 0.0–0.1)
Basophils Relative: 0 %
Eosinophils Absolute: 0.2 10*3/uL (ref 0.0–0.5)
Eosinophils Relative: 3 %
HCT: 46.8 % (ref 39.0–52.0)
Hemoglobin: 15.3 g/dL (ref 13.0–17.0)
Immature Granulocytes: 0 %
Lymphocytes Relative: 35 %
Lymphs Abs: 2.2 10*3/uL (ref 0.7–4.0)
MCH: 26.7 pg (ref 26.0–34.0)
MCHC: 32.7 g/dL (ref 30.0–36.0)
MCV: 81.7 fL (ref 80.0–100.0)
Monocytes Absolute: 0.5 10*3/uL (ref 0.1–1.0)
Monocytes Relative: 8 %
Neutro Abs: 3.4 10*3/uL (ref 1.7–7.7)
Neutrophils Relative %: 54 %
Platelets: 166 10*3/uL (ref 150–400)
RBC: 5.73 MIL/uL (ref 4.22–5.81)
RDW: 13.5 % (ref 11.5–15.5)
WBC: 6.3 10*3/uL (ref 4.0–10.5)
nRBC: 0 % (ref 0.0–0.2)

## 2023-06-13 LAB — COMPREHENSIVE METABOLIC PANEL
ALT: 34 U/L (ref 0–44)
AST: 38 U/L (ref 15–41)
Albumin: 3.8 g/dL (ref 3.5–5.0)
Alkaline Phosphatase: 62 U/L (ref 38–126)
Anion gap: 9 (ref 5–15)
BUN: 13 mg/dL (ref 6–20)
CO2: 24 mmol/L (ref 22–32)
Calcium: 9.1 mg/dL (ref 8.9–10.3)
Chloride: 102 mmol/L (ref 98–111)
Creatinine, Ser: 0.71 mg/dL (ref 0.61–1.24)
GFR, Estimated: >60 mL/min (ref >60)
Glucose, Bld: 102 mg/dL — ABNORMAL HIGH (ref 70–99)
Potassium: 3.6 mmol/L (ref 3.5–5.1)
Sodium: 135 mmol/L (ref 135–145)
Total Bilirubin: 0.9 mg/dL (ref 0.3–1.2)
Total Protein: 7.2 g/dL (ref 6.5–8.1)

## 2023-06-13 LAB — MAGNESIUM: Magnesium: 2 mg/dL (ref 1.7–2.4)

## 2023-06-13 LAB — PHOSPHORUS: Phosphorus: 4.4 mg/dL (ref 2.5–4.6)

## 2023-06-13 MED ORDER — LORAZEPAM 1 MG PO TABS
1.0000 mg | ORAL_TABLET | ORAL | Status: AC | PRN
  Administered 2023-06-13: 4 mg via ORAL
  Administered 2023-06-14: 2 mg via ORAL
  Filled 2023-06-13: qty 4
  Filled 2023-06-13: qty 2

## 2023-06-13 MED ORDER — THIAMINE MONONITRATE 100 MG PO TABS
100.0000 mg | ORAL_TABLET | Freq: Every day | ORAL | Status: DC
  Administered 2023-06-14 – 2023-06-16 (×3): 100 mg via ORAL
  Filled 2023-06-13 (×3): qty 1

## 2023-06-13 MED ORDER — HALOPERIDOL LACTATE 5 MG/ML IJ SOLN
2.0000 mg | Freq: Four times a day (QID) | INTRAMUSCULAR | Status: DC | PRN
  Administered 2023-06-13 – 2023-06-14 (×3): 2 mg via INTRAVENOUS
  Filled 2023-06-13 (×4): qty 1

## 2023-06-13 MED ORDER — POTASSIUM CHLORIDE CRYS ER 20 MEQ PO TBCR
40.0000 meq | EXTENDED_RELEASE_TABLET | Freq: Two times a day (BID) | ORAL | Status: AC
  Administered 2023-06-13 (×2): 40 meq via ORAL
  Filled 2023-06-13 (×2): qty 2

## 2023-06-13 MED ORDER — LORAZEPAM 2 MG/ML IJ SOLN: 1.0000 mg | INTRAMUSCULAR | Status: AC | PRN

## 2023-06-13 NOTE — Consult Note (Signed)
[facility] Face-to-Face Psychiatry Consult   Reason for Consult:  Suicidal ideations Referring Physician:  Dr. Marland Mcalpine Patient Identification: [name] MRN:  [id number] Principal Diagnosis: Alcohol withdrawal (HCC) Diagnosis:  Principal Problem:   Alcohol withdrawal (HCC)  Total Time Spent in Direct Patient Care:  I personally spent 40 minutes on the unit in direct patient care. The direct patient care time included face-to-face time with the patient, reviewing the patient's chart, communicating with other professionals, and coordinating care. Greater than 50% of this time was spent in counseling or coordinating care with the patient regarding goals of hospitalization, psycho-education, and discharge planning needs.   HPI:   [name] is a 52 y.o. male patient admitted with alcohol withdrawal.  From initial psychiatric evaluation 06/10/2023: He reports that his alcohol consumption increased approximately seven months ago, subsequent to becoming a 100% service-connected veteran. Over the past two months, his drinking has escalated significantly, though he is unable to quantify the financial impact, only indicating that it is substantial. According to both the patient and confirmation from his girlfriend, he consumes about 36 beers daily. The patient acknowledges experiencing alcohol-related complications, including delirium, tremors, and hallucinations. However, he denies any seizure activity. A brief suicide screening was conducted; he does not recall making any suicidal statements with intent but admits to a history of suicidal ideations and statements while under the influence of alcohol. No suicide attempts were reported, and he denies any active suicidal ideations at the current time. Given his current medical instability, a more thorough assessment for suicidality will be deferred until he is medically stabilized and more oriented. This approach is necessary to ensure an accurate  understanding of his mental health needs and risk factors. Continuous monitoring and supportive interventions will be maintained to manage his symptoms of delirium and to prepare for further psychiatric evaluation once his condition improves.  9/5: On evaluation today, patient is just getting back into bed after morning care.  He is awake, alert, and oriented.  He states that he typically gets his medical and mental health services through the [facility].  He has previously been on trazodone and Abilify for sleep, which have been ineffective.  He is not interested in taking psychotropic medications.  Patient states that he has difficulty sleeping due to having nightmares and vivid dreams.  He also states that he sleepwalks and reenacts his dreams.  Patient does not recall if he has previously been on prazosin, but does not want to start new medications at this time.  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  Patient states that he has been to residential rehabilitation programs 10-15 times, but always relapses after he discharges.  He is not interested in going back to a residential rehabilitation program at this time.  He would be interested in going to a substance abuse intensive outpatient program if there is one available to him.  Patient states that he does living [place].  He reports a good support system to include his significant other and his 59 year old son.  He denies any DUIs or court dates pending.  Patient denies access to weapons.  He specifically denies any suicidal ideation, plan, or intent.  He denies any homicidal ideation.  He denies any auditory or visual hallucinations.  06/13/23: Patient seen today face to face in ICU. He was reported to be agitated and combative earlier today but he is a calmer now. Patient is alert, awake  and oriented x 4. He still anxious, irritable, apprehensive but making a slow but  steady progress with current treatment. He reports that he only get suicidal when he is intoxicated. Currently, he denies delusions, psychosis, homicidal thoughts but still gets easily agitated. He is unable to contract for safety   Past Psychiatric History:  52 year old man who comes to the hospital frequently for detox and sometimes leaves AGAINST MEDICAL ADVICE again admitted for medically supervised.moderate-severe risk of alcohol withdrawal symptoms that could not be safely managed as an outpatient.  Risk to Self:  Yes, related to alcoholism Risk to Others:  Denies Prior Inpatient Therapy:  Denies [facility]  Past Medical History:  Past Medical History:  Diagnosis Date   Active smoker    Alcohol abuse    Anxiety    Bipolar 2 disorder (HCC)    Depression    PTSD (post-traumatic stress disorder)     Past Surgical History:  Procedure Laterality Date   LYMPH NODE DISSECTION     Family History:  Family History  Problem Relation Age of Onset   Alcohol abuse Father    Family Psychiatric  History: Denies  Social History:  Social History   Substance and Sexual Activity  Alcohol Use Yes   Comment: Patient states he is a binge drinker.     Social History   Substance and Sexual Activity  Drug Use Yes   Types: Marijuana    Social History   Socioeconomic History   Marital status: Legally Separated    Spouse name: Not on file   Number of children: Not on file   Years of education: Not on file   Highest education level: Not on file  Occupational History   Not on file  Tobacco Use   Smoking status: Every Day    Current packs/day: 2.00    Average packs/day: 2.0 packs/day for 20.0 years (40.0 ttl pk-yrs)    Types: Cigarettes   Smokeless tobacco: Never  Vaping Use   Vaping status: Never Used  Substance and Sexual Activity   Alcohol use: Yes    Comment: Patient states he is a binge drinker.   Drug use: Yes    Types: Marijuana    Sexual activity: Yes  Other Topics Concern   Not on file  Social History Narrative   Has an ex-wife and 35 yo son whom he cannot see now, as his ex-wife divorced him. Was in the [employer] and went to [place], [place], and may have some PTSD symptoms from this. Was also a Management consultant. Heavy alcohol abuse during binges, but has had periods of sobriety for months on end. Has been drinking up to two cases of beer a day   Social Determinants of Health   Financial Resource Strain: Not on file  Food Insecurity: Not on file  Transportation Needs: Not on file  Physical Activity: Not on file  Stress: Not on file  Social Connections: Unknown (01/13/2023)   Received from [facility]   Social Network    Social Network: Not on file   Additional Social History:    Allergies:   Allergies  Allergen Reactions   Effexor [Venlafaxine] Hives   Nicoderm [Nicotine] Rash   Paxil [Paroxetine Hcl] Hives and Rash    Labs:  Results for orders placed or performed during the hospital encounter of 06/09/23 (from the past 48 hour(s))  Comprehensive metabolic panel     Status: Abnormal   Collection Time:  06/12/23  3:10 AM  Result Value Ref Range   Sodium 138 135 - 145 mmol/L   Potassium 3.8 3.5 - 5.1 mmol/L   Chloride 105 98 - 111 mmol/L   CO2 24 22 - 32 mmol/L   Glucose, Bld 116 (H) 70 - 99 mg/dL    Comment: Glucose reference range applies only to samples taken after fasting for at least 8 hours.   BUN 10 6 - 20 mg/dL   Creatinine, Ser 1.47 0.61 - 1.24 mg/dL   Calcium 9.1 8.9 - 82.9 mg/dL   Total Protein 7.4 6.5 - 8.1 g/dL   Albumin 3.7 3.5 - 5.0 g/dL   AST 42 (H) 15 - 41 U/L   ALT 33 0 - 44 U/L   Alkaline Phosphatase 63 38 - 126 U/L   Total Bilirubin 0.6 0.3 - 1.2 mg/dL   GFR, Estimated >56 >21 mL/min    Comment: (NOTE) Calculated using the CKD-EPI Creatinine Equation (2021)    Anion gap 9 5 - 15    Comment: Performed at [facility], [address],  [place], [place] [address]  CBC with Differential/Platelet     Status: None   Collection Time: 06/12/23  3:10 AM  Result Value Ref Range   WBC 5.4 4.0 - 10.5 K/uL   RBC 5.62 4.22 - 5.81 MIL/uL   Hemoglobin 15.3 13.0 - 17.0 g/dL   HCT 78.4 69.6 - 29.5 %   MCV 81.5 80.0 - 100.0 fL   MCH 27.2 26.0 - 34.0 pg   MCHC 33.4 30.0 - 36.0 g/dL   RDW 28.4 13.2 - 44.0 %   Platelets 159 150 - 400 K/uL   nRBC 0.0 0.0 - 0.2 %   Neutrophils Relative % 52 %   Neutro Abs 2.8 1.7 - 7.7 K/uL   Lymphocytes Relative 35 %   Lymphs Abs 1.9 0.7 - 4.0 K/uL   Monocytes Relative 7 %   Monocytes Absolute 0.4 0.1 - 1.0 K/uL   Eosinophils Relative 5 %   Eosinophils Absolute 0.3 0.0 - 0.5 K/uL   Basophils Relative 1 %   Basophils Absolute 0.0 0.0 - 0.1 K/uL   Immature Granulocytes 0 %   Abs Immature Granulocytes 0.01 0.00 - 0.07 K/uL    Comment: Performed at [facility], [address] [place], [place] [address]  Phosphorus     Status: None   Collection Time: 06/12/23  3:10 AM  Result Value Ref Range   Phosphorus 3.9 2.5 - 4.6 mg/dL    Comment: Performed at [facility], [address] [place], [place] [address]  Magnesium     Status: None   Collection Time: 06/12/23  3:10 AM  Result Value Ref Range   Magnesium 2.1 1.7 - 2.4 mg/dL    Comment: Performed at [facility], [address], [place], [place] [address]  CBC with Differential/Platelet     Status: None   Collection Time: 06/13/23  3:00 AM  Result Value Ref Range   WBC 6.3 4.0 - 10.5 K/uL   RBC 5.73 4.22 - 5.81 MIL/uL   Hemoglobin 15.3 13.0 - 17.0 g/dL   HCT 42.5 95.6 - 38.7 %   MCV 81.7 80.0 - 100.0 fL   MCH 26.7 26.0 - 34.0 pg   MCHC 32.7 30.0 - 36.0 g/dL   RDW 56.4 33.2 - 95.1 %   Platelets 166 150 - 400 K/uL   nRBC 0.0 0.0 - 0.2 %  Neutrophils Relative % 54 %   Neutro Abs 3.4 1.7 - 7.7 K/uL   Lymphocytes Relative 35 %   Lymphs Abs 2.2 0.7 - 4.0 K/uL   Monocytes Relative 8 %    Monocytes Absolute 0.5 0.1 - 1.0 K/uL   Eosinophils Relative 3 %   Eosinophils Absolute 0.2 0.0 - 0.5 K/uL   Basophils Relative 0 %   Basophils Absolute 0.0 0.0 - 0.1 K/uL   Immature Granulocytes 0 %   Abs Immature Granulocytes 0.01 0.00 - 0.07 K/uL    Comment: Performed at [facility], [address], [place], [place] [address]  Comprehensive metabolic panel     Status: Abnormal   Collection Time: 06/13/23  3:00 AM  Result Value Ref Range   Sodium 135 135 - 145 mmol/L   Potassium 3.6 3.5 - 5.1 mmol/L   Chloride 102 98 - 111 mmol/L   CO2 24 22 - 32 mmol/L   Glucose, Bld 102 (H) 70 - 99 mg/dL    Comment: Glucose reference range applies only to samples taken after fasting for at least 8 hours.   BUN 13 6 - 20 mg/dL   Creatinine, Ser 4.78 0.61 - 1.24 mg/dL   Calcium 9.1 8.9 - 29.5 mg/dL   Total Protein 7.2 6.5 - 8.1 g/dL   Albumin 3.8 3.5 - 5.0 g/dL   AST 38 15 - 41 U/L   ALT 34 0 - 44 U/L   Alkaline Phosphatase 62 38 - 126 U/L   Total Bilirubin 0.9 0.3 - 1.2 mg/dL   GFR, Estimated >62 >13 mL/min    Comment: (NOTE) Calculated using the CKD-EPI Creatinine Equation (2021)    Anion gap 9 5 - 15    Comment: Performed at [facility], [address], [place], [place] [address]  Phosphorus     Status: None   Collection Time: 06/13/23  3:00 AM  Result Value Ref Range   Phosphorus 4.4 2.5 - 4.6 mg/dL    Comment: Performed at [facility], [address] [place], [place] [address]  Magnesium     Status: None   Collection Time: 06/13/23  3:00 AM  Result Value Ref Range   Magnesium 2.0 1.7 - 2.4 mg/dL    Comment: Performed at [facility], [address], [place], [place] [address]    Current Facility-Administered Medications  Medication Dose Route Frequency Provider Last Rate Last Admin   Chlorhexidine Gluconate Cloth 2 % PADS 6 each  6 each Topical Daily Opyd, Lavone Neri, MD   6 each at 06/13/23 1309    dexmedetomidine (PRECEDEX) 400 MCG/100ML (4 mcg/mL) infusion  0.2-0.7 mcg/kg/hr Intravenous Continuous Marguerita Merles Latif, DO 12.02 mL/hr at 06/13/23 1200 0.7 mcg/kg/hr at 06/13/23 1200   enoxaparin (LOVENOX) injection 40 mg  40 mg Subcutaneous Q24H Opyd, Lavone Neri, MD   40 mg at 06/12/23 2137   folic acid (FOLVITE) tablet 1 mg  1 mg Oral Daily Opyd, Lavone Neri, MD   1 mg at 06/13/23 4132   haloperidol lactate (HALDOL) injection 2 mg  2 mg Intravenous Q6H PRN Marguerita Merles Latif, DO   2 mg at 06/13/23 0945   LORazepam (ATIVAN) tablet 1-4 mg  1-4 mg Oral Q1H PRN Marguerita Merles Latif, DO       Or   LORazepam (ATIVAN) injection 1-4 mg  1-4 mg Intravenous Q1H PRN Marguerita Merles Latif, DO       multivitamin with minerals tablet 1  tablet  1 tablet Oral Daily Opyd, Lavone Neri, MD   1 tablet at 06/12/23 1231   nicotine (NICODERM CQ - dosed in mg/24 hours) patch 14 mg  14 mg Transdermal Daily Marguerita Merles [place], DO   14 mg at 06/13/23 9604   nicotine polacrilex (NICORETTE) gum 2 mg  2 mg Oral PRN Marguerita Merles Latif, DO       Oral care mouth rinse  15 mL Mouth Rinse PRN Opyd, Lavone Neri, MD       PHENObarbital (LUMINAL) injection 32.5 mg  32.5 mg Intravenous Q8H Maryagnes Amos, FNP   32.5 mg at 06/13/23 5409   potassium chloride SA (KLOR-CON M) CR tablet 40 mEq  40 mEq Oral BID Marguerita Merles [place], DO   40 mEq at 06/13/23 8119   prochlorperazine (COMPAZINE) injection 5 mg  5 mg Intravenous Q4H PRN Opyd, Lavone Neri, MD       senna-docusate (Senokot-S) tablet 1 tablet  1 tablet Oral QHS PRN Opyd, Lavone Neri, MD       sodium chloride flush (NS) 0.9 % injection 3 mL  3 mL Intravenous Q12H Opyd, Lavone Neri, MD   3 mL at 06/13/23 0826   [START ON 06/14/2023] thiamine (VITAMIN B1) tablet 100 mg  100 mg Oral Daily Merlene Laughter, DO        Musculoskeletal: Strength & Muscle Tone: within normal limits Gait & Station: unsteady Patient leans: N/A  Psychiatric Specialty Exam:  Presentation  General  Appearance:  Casual  Eye Contact: Fair  Speech: Normal Rate  Speech Volume: Normal  Handedness: Right   Mood and Affect  Mood: Dysphoric  Affect: Congruent   Thought Process  Thought Processes: Coherent; Goal Directed; Linear  Descriptions of Associations:Intact  Orientation:Full (Time, Place and Person)  Thought Content:Logical  History of Schizophrenia/Schizoaffective disorder:No data recorded Duration of Psychotic Symptoms:No data recorded Hallucinations:No data recorded  Ideas of Reference:None  Suicidal Thoughts:suicidal  Homicidal Thoughts:denies   Sensorium  Memory: Immediate Good; Recent Fair; Remote Fair  Judgment: Poor  Insight: Shallow   Executive Functions  Concentration: Fair  Attention Span: Fair  Recall: Fair  Fund of Knowledge: Fair  Language: Good   Psychomotor Activity  Psychomotor Activity:No data recorded   Assets  Assets: Communication Skills; Desire for Improvement; Financial Resources/Insurance; Housing; Resilience; Social Support   Sleep  Sleep:No data recorded   Physical Exam: Physical Exam Vitals and nursing note reviewed.  Constitutional:      General: He is awake. He is not in acute distress.    Appearance: He is normal weight.     Interventions: Nasal cannula in place.  Pulmonary:     Effort: Pulmonary effort is normal. No respiratory distress.  Neurological:     General: No focal deficit present.     Mental Status: He is oriented to person, place, and time and easily aroused. He is confused.     Cranial Nerves: Cranial nerves 2-12 are intact.     Deep Tendon Reflexes: Reflexes are normal and symmetric.    Review of Systems  Psychiatric/Behavioral:  Positive for substance abuse (ETOH). Negative for depression, hallucinations and suicidal ideas. The patient is not nervous/anxious.   All other systems reviewed and are negative.  Blood pressure 101/69, pulse 72, temperature (!) 97.5 F  (36.4 C), temperature source Oral, resp. rate 17, height 5\' 10"  (1.778 m), weight 68.7 kg, SpO2 93%. Body mass index is 21.73 kg/m.  Treatment Plan Summary: Medication management -Continue Haldol 2mg  IM/IV  q6 prn for agitation  Alcohol detox:  -Continue CIWA protocol with ativan. Patient is a candidate for Phenobarb, recent CIWA scores > 30. Will likely require Dex, will leave that up to primary team.  -Labs obtained include B12(536>204), TSH (1.11), BAL (363), UDS (THC),   -Alcohol Delirium, improving:  --high dose IV thiamine, B1 obtained. Thiamine 500mg  po TID x 3 days.  -Phenobarb detox protocol --Haldol 2mg  q6hr IV for agitation/delirium -EKG 06/10/2023: QTC 414 within normal. -Continue safety sitter due to high risk for falls  While future psychiatric events cannot be accurately predicted, the patient does not currently require acute inpatient psychiatric care and does not currently meet [facility] involuntary commitment criteria.  Patient will likely benefit from inpatient residential rehab at the [place].  Recommend TOC start process now to prevent further delay in transfer.  Psychiatry will sign off.  Please re-consult for any future acute psychiatric concerns.   Disposition:  Disposition is pending, Patient currently receiving multiple services at the [place]. Generally admitted once a month.   Thedore Mins, MD 06/13/2023 3:27 PM

## 2023-06-13 NOTE — Progress Notes (Addendum)
PROGRESS NOTE    [name]  ZOX:[id number] DOB: [birth year] DOA: 06/09/2023 PCP: [facility]   Brief Narrative:  The Patient is a 52 year old Caucasian male with a past medical history significant for Brilinta anxiety, depression, PTSD, bipolar type II, alcohol abuse and dependence who presented to the ED seeking help for alcohol problem.  He had been consuming roughly about 25 beers a day and expressing suicidal ideations was brought in by his significant other.  Confirms feeling suicidal but was unable to elaborate and subsequently started withdrawing.  Started having some hallucinations.  Upon arrival to ED he is afebrile saturating low 90s on room air with an elevated heart rate and stable blood pressure.  His alcohol level on admission was 383.  He was given 1 L of LR and 2 mg of IV Haldol and 2 mg IV Ativan and thiamine in the ED.  Subsequently he was placed on a one-to-one sitter and psychiatry was consulted.  Psychiatry has now placed him on a phenobarbital taper as he continues to receive the stepdown/ICU alcohol withdrawal protocol with CIWA and lorazepam but given his continued withdrawal will place on a Precedex gtt.   Patient continued to be extremely agitated and started becoming combative so Precedex drip was initiated being titrated and is now on max dose.  Given concern for his safety and given his prior suicidal ideation a decision was made to involuntarily commit him until he is out of withdrawals to properly assess his psychiatric status.  He continues to be on max dose of the Precedex and continues to have intermittent agitation and also was on the phenobarbital taper and I feel less likely that he is withdrawing now and I feel that he is more having behavioral outburst.  Given his outburst this morning and wanting to leave we added IV Haldol per psychiatric recommendations and reconsult psych.  If psych clears today him and feel that he is not suicidal or danger to  himself or others we can rescind his IVC but they had recommended inpatient residential rehab at the [place] currently and will need to have a reassessment today.  Assessment and Plan:  Alcohol Dependence and Intoxication with Withdrawal and Hallucinations, improving -Continued with CIWA Protocol and with scheduled lorazepam and as needed lorazepam for withdrawal symptoms however changed to Precedex as below -Patient's alcohol level on admission was 383 -Continue with multivitamin with minerals, folic acid and thiamine but will continue high-dose thiamine 500 mg 3 times daily for 3 doses and then 250 mg 3 times daily for 3 doses and then start p.o. 100 mg daily on 06/14/2023 -IV fluid hydration has now stopped. -His ammonia level was 25 and his TSH was 1.119 -Given the severity of his Withdrawal Symptoms will start Dexmedetomidine 0.2-0.7 mcg and remains on max dose currently still and is on a phenobarbital taper as above -Psychiatry evaluated and starting the patient on phenobarbital taper with IV 6 5 mg every 8h for 6 doses which he is already completed and has now been initiated on phenobarbital 32.5 mg every 8 for 6 more doses -Psychiatry felt that he does not need Inpatient Psychiatric Hospitalization but feel that he requires Inpatient Residential Rehab at the [facility] and [facility] to start the process with patient is wanting to leave   Suicidal Ideation  Hx of Bipolar Disorder, Anxiety and Depression, PTSD -Pt confirmed SI but is unable to contribute much else to the history in ED  -Suicide precautions for now and one-to-one sitter -  Psychiatry evaluated and he denied any suicidal ideation to the psychiatry team yesterday.  Will continue one-to-one sitter just in case though given his agitation and hallucinations -Given his prior suicidal ideations and his attempt to leave AGAINST MEDICAL ADVICE will involuntarily commit him for now and get him out of withdrawals and then appropriately assess his psychiatric  status and his suicidal ideations; IF psych clears him we will rescind his IVC but he continues to have behavioral outbursts and agitation with security being called on him multiple times -Psychiatry recommending Haldol 2 mg every 6 IV for agitation and delirium -Psychiatry feels that he does not need Inpatient Psychiatric Hospitalization but feel that he requires Inpatient Residential Rehab at the [facility] and have recommended the [facility] team to start the process -I do not feel the patient is withdrawing from alcohol as much now and I think he is starting to have some behavioral issues from his Psychiatric issues given his extreme agitation and outbursts in wanting to leave.  He had to be given Haldol and Ativan and remains on the Precedex drip maxed.  We have asked psychiatry to reevaluate him and patient denied to me that he had suicidal ideation and was frustrated at his girlfriend had reported his SI.  Will need to elucidate if he is truly suicidal given that he has improved from a withdrawal standpoint and I feel that this is mainly behavioral issues now going on   Hypokalemia -Patient's K+ Level Trend: Recent Labs  Lab 06/09/23 1825 06/10/23 0259 06/11/23 0300 06/12/23 0310 06/13/23 0300  K 3.4* 3.8 3.8 3.8 3.6  -IV fluid hydration has now stopped -Continue to Monitor and Replete as Necessary -Repeat CMP in the AM   Elevated AST, mild and improved -Mild and Likely Reactive. LFT Trend: Recent Labs  Lab 06/09/23 1825 06/10/23 0259 06/11/23 0300 06/12/23 0310 06/13/23 0300  AST 44* 39 37 42* 38  ALT 38 33 30 33 34  -Continue to Monitor and trend and repeat CMP in the AM   THC Positive -UDS + for THC -Counseling given -Will need continued monitoring  Tobacco Abuse -Smoking cessation counseling given and patient has been initiated on a nicotine transdermal patch 14 mg daily and nicotine polacrilex gum 2 mg as needed.   -Smoking cessation given  Hyperbilirubinemia -Mild. Bilirubin  Trend: Recent Labs  Lab 06/09/23 1825 06/10/23 0259 06/11/23 0300 06/12/23 0310 06/13/23 0300  BILITOT 0.7 0.9 1.3* 0.6 0.9  -Continue to Monitor and Trend and repeat CMP in the AM   Hypoalbuminemia -Patient's Albumin Trend: Recent Labs  Lab 06/09/23 1825 06/10/23 0259 06/11/23 0300 06/12/23 0310 06/13/23 0300  ALBUMIN 4.4 3.7 3.3* 3.7 3.8  -Continue to Monitor and Trend and repeat CMP in the AM   DVT prophylaxis: enoxaparin (LOVENOX) injection 40 mg Start: 06/09/23 2200    Code Status: Full Code Family Communication: No family currently at bedside  Disposition Plan:  Level of care: Stepdown Status is: Inpatient Remains inpatient appropriate because: Needs further clinical improvement and further clearance by psych given his continued combativeness and outbursts with severe agitation   Consultants:  Psychiatry  Procedures:  As delineated as above  Antimicrobials:  Anti-infectives (From admission, onward)    None       Subjective: Seen and examined and was resting but then became extremely agitated angry and combative and started pulling off his leads wanting to leave the hospital.  I had discussed with him about his withdrawals and that he was still on  a Precedex drip and Phenobarbital Taper and he refused to listen to me and started attempting to leave despite being involuntary committed.  Security was called and he was brought back to bed and psychiatry was reconsulted and are to reevaluate the patient.  Patient was given Haldol and lorazepam and then calmed down.  Nursing reported no other changes  Objective: Vitals:   06/13/23 1000 06/13/23 1100 06/13/23 1200 06/13/23 1400  BP: 119/81 121/70 94/64 101/69  Pulse: 71 76 74 72  Resp: 19 13 15 17   Temp:      TempSrc:      SpO2: 96% 96% 96% 93%  Weight:      Height:        Intake/Output Summary (Last 24 hours) at 06/13/2023 1519 Last data filed at 06/13/2023 1200 Gross per 24 hour  Intake 313.75 ml  Output  --  Net 313.75 ml   Filed Weights   06/09/23 1753 06/09/23 2216  Weight: 81.5 kg 68.7 kg   Examination: Physical Exam:  Constitutional: WN/WD Caucasian male who is extremely agitated and combative Respiratory: Diminished to auscultation bilaterally, no wheezing, rales, rhonchi or crackles. Normal respiratory effort and patient is not tachypenic. No accessory muscle use.  Unlabored breathing Cardiovascular: Tachycardic rate, no murmurs / rubs / gallops. S1 and S2 auscultated. No extremity edema..  Abdomen: Soft, non-tender, non-distended. Bowel sounds positive.  GU: Deferred. Musculoskeletal: No clubbing / cyanosis of digits/nails. No joint deformity upper and lower extremities. Skin: No rashes, lesions, ulcers on limited skin evaluation. No induration; Warm and dry.  Neurologic: CN 2-12 grossly intact with no focal deficits. Mild Tremors Psychiatric: Normal judgment and insight.  Extremely anxious and agitated this AM and attempting to leave  Data Reviewed: I have personally reviewed following labs and imaging studies  CBC: Recent Labs  Lab 06/09/23 1825 06/10/23 0259 06/11/23 0300 06/12/23 0310 06/13/23 0300  WBC 7.4 7.6 4.9 5.4 6.3  NEUTROABS 2.5  --  2.6 2.8 3.4  HGB 16.4 14.4 13.3 15.3 15.3  HCT 48.8 43.6 40.5 45.8 46.8  MCV 80.4 82.4 82.3 81.5 81.7  PLT 236 238 154 159 166   Basic Metabolic Panel: Recent Labs  Lab 06/09/23 1825 06/10/23 0259 06/11/23 0300 06/12/23 0310 06/13/23 0300  NA 140 140 138 138 135  K 3.4* 3.8 3.8 3.8 3.6  CL 101 101 106 105 102  CO2 25 26 23 24 24   GLUCOSE 103* 86 118* 116* 102*  BUN 8 9 11 10 13   CREATININE 0.75 0.63 0.77 0.61 0.71  CALCIUM 8.5* 8.3* 8.6* 9.1 9.1  MG  --  2.2 2.3 2.1 2.0  PHOS  --  3.9 3.1 3.9 4.4   GFR: Estimated Creatinine Clearance: 106.2 mL/min (by C-G formula based on SCr of 0.71 mg/dL). Liver Function Tests: Recent Labs  Lab 06/09/23 1825 06/10/23 0259 06/11/23 0300 06/12/23 0310 06/13/23 0300   AST 44* 39 37 42* 38  ALT 38 33 30 33 34  ALKPHOS 60 53 55 63 62  BILITOT 0.7 0.9 1.3* 0.6 0.9  PROT 8.4* 7.0 6.7 7.4 7.2  ALBUMIN 4.4 3.7 3.3* 3.7 3.8   No results for input(s): "LIPASE", "AMYLASE" in the last 168 hours. Recent Labs  Lab 06/10/23 1036  AMMONIA 25   Coagulation Profile: No results for input(s): "INR", "PROTIME" in the last 168 hours. Cardiac Enzymes: No results for input(s): "CKTOTAL", "CKMB", "CKMBINDEX", "TROPONINI" in the last 168 hours. BNP (last 3 results) No results for input(s): "PROBNP" in the  last 8760 hours. HbA1C: No results for input(s): "HGBA1C" in the last 72 hours. CBG: No results for input(s): "GLUCAP" in the last 168 hours. Lipid Profile: No results for input(s): "CHOL", "HDL", "LDLCALC", "TRIG", "CHOLHDL", "LDLDIRECT" in the last 72 hours. Thyroid Function Tests: No results for input(s): "TSH", "T4TOTAL", "FREET4", "T3FREE", "THYROIDAB" in the last 72 hours. Anemia Panel: No results for input(s): "VITAMINB12", "FOLATE", "FERRITIN", "TIBC", "IRON", "RETICCTPCT" in the last 72 hours. Sepsis Labs: No results for input(s): "PROCALCITON", "LATICACIDVEN" in the last 168 hours.  Recent Results (from the past 240 hour(s))  MRSA Next Gen by PCR, Nasal     Status: None   Collection Time: 06/09/23 10:02 PM   Specimen: Nasal Mucosa; Nasal Swab  Result Value Ref Range Status   MRSA by PCR Next Gen NOT DETECTED NOT DETECTED Final    Comment: (NOTE) The GeneXpert MRSA Assay (FDA approved for NASAL specimens only), is one component of a comprehensive MRSA colonization surveillance program. It is not intended to diagnose MRSA infection nor to guide or monitor treatment for MRSA infections. Test performance is not FDA approved in patients less than 34 years old. Performed at [facility], [address], [place], [place] [address]     Radiology Studies: No results found.  Scheduled Meds:  Chlorhexidine Gluconate Cloth  6 each  Topical Daily   enoxaparin (LOVENOX) injection  40 mg Subcutaneous Q24H   folic acid  1 mg Oral Daily   multivitamin with minerals  1 tablet Oral Daily   nicotine  14 mg Transdermal Daily   PHENObarbital  32.5 mg Intravenous Q8H   potassium chloride  40 mEq Oral BID   sodium chloride flush  3 mL Intravenous Q12H   [START ON 06/14/2023] thiamine  100 mg Oral Daily   Continuous Infusions:  dexmedetomidine (PRECEDEX) IV infusion 0.7 mcg/kg/hr (06/13/23 1200)    LOS: 4 days   Marguerita Merles, DO [facility] Available via Epic secure chat 7am-7pm After these hours, please refer to coverage provider listed on [email or web address] 06/13/2023, 3:19 PM

## 2023-06-13 NOTE — Progress Notes (Signed)
Patient requesting to get out of bed. Walked to bathroom and back with Psychologist, sport and exercise. Stated he was being lied to by staff and not given his correct medications. When asked what medication he was referring to, patient stated trazodone for sleep. Explained that I could not give trazodone as it was not ordered for him. Patient became verbally aggressive and began yelling loudly that every other nurse has given him what he wanted. Multiple staff members in room at this time and able to re direct patient to lay back in bed/lower tone of voice. Provider notified that patient would like sleep med

## 2023-06-13 NOTE — Plan of Care (Signed)
  Problem: Clinical Measurements: Goal: Diagnostic test results will improve Outcome: Progressing   Problem: Elimination: Goal: Will not experience complications related to bowel motility Outcome: Progressing Goal: Will not experience complications related to urinary retention Outcome: Progressing   Problem: Education: Goal: Knowledge of General Education information will improve Description: Including pain rating scale, medication(s)/side effects and non-pharmacologic comfort measures Outcome: Not Progressing   Problem: Health Behavior/Discharge Planning: Goal: Ability to manage health-related needs will improve Outcome: Not Progressing   Problem: Coping: Goal: Level of anxiety will decrease Outcome: Not Progressing   Problem: Pain Managment: Goal: General experience of comfort will improve Outcome: Not Progressing

## 2023-06-14 DIAGNOSIS — F32A Depression, unspecified: Secondary | ICD-10-CM

## 2023-06-14 DIAGNOSIS — F10931 Alcohol use, unspecified with withdrawal delirium: Secondary | ICD-10-CM | POA: Diagnosis not present

## 2023-06-14 DIAGNOSIS — R45851 Suicidal ideations: Secondary | ICD-10-CM | POA: Diagnosis not present

## 2023-06-14 LAB — CBC WITH DIFFERENTIAL/PLATELET
Abs Immature Granulocytes: 0.01 10*3/uL (ref 0.00–0.07)
Basophils Absolute: 0 10*3/uL (ref 0.0–0.1)
Basophils Relative: 0 %
Eosinophils Absolute: 0.2 10*3/uL (ref 0.0–0.5)
Eosinophils Relative: 4 %
HCT: 47.2 % (ref 39.0–52.0)
Hemoglobin: 15.4 g/dL (ref 13.0–17.0)
Immature Granulocytes: 0 %
Lymphocytes Relative: 42 %
Lymphs Abs: 2.7 10*3/uL (ref 0.7–4.0)
MCH: 27.1 pg (ref 26.0–34.0)
MCHC: 32.6 g/dL (ref 30.0–36.0)
MCV: 83 fL (ref 80.0–100.0)
Monocytes Absolute: 0.5 10*3/uL (ref 0.1–1.0)
Monocytes Relative: 8 %
Neutro Abs: 2.9 10*3/uL (ref 1.7–7.7)
Neutrophils Relative %: 46 %
Platelets: 157 10*3/uL (ref 150–400)
RBC: 5.69 MIL/uL (ref 4.22–5.81)
RDW: 13.7 % (ref 11.5–15.5)
WBC: 6.3 10*3/uL (ref 4.0–10.5)
nRBC: 0 % (ref 0.0–0.2)

## 2023-06-14 LAB — COMPREHENSIVE METABOLIC PANEL
ALT: 34 U/L (ref 0–44)
AST: 33 U/L (ref 15–41)
Albumin: 3.8 g/dL (ref 3.5–5.0)
Alkaline Phosphatase: 62 U/L (ref 38–126)
Anion gap: 10 (ref 5–15)
BUN: 11 mg/dL (ref 6–20)
CO2: 22 mmol/L (ref 22–32)
Calcium: 9.1 mg/dL (ref 8.9–10.3)
Chloride: 102 mmol/L (ref 98–111)
Creatinine, Ser: 0.7 mg/dL (ref 0.61–1.24)
GFR, Estimated: >60 mL/min (ref >60)
Glucose, Bld: 119 mg/dL — ABNORMAL HIGH (ref 70–99)
Potassium: 3.9 mmol/L (ref 3.5–5.1)
Sodium: 134 mmol/L — ABNORMAL LOW (ref 135–145)
Total Bilirubin: 0.8 mg/dL (ref 0.3–1.2)
Total Protein: 7.5 g/dL (ref 6.5–8.1)

## 2023-06-14 LAB — MAGNESIUM: Magnesium: 2.2 mg/dL (ref 1.7–2.4)

## 2023-06-14 LAB — PHOSPHORUS: Phosphorus: 4.2 mg/dL (ref 2.5–4.6)

## 2023-06-14 MED ORDER — CHLORDIAZEPOXIDE HCL 5 MG PO CAPS
25.0000 mg | ORAL_CAPSULE | Freq: Three times a day (TID) | ORAL | Status: AC
  Administered 2023-06-15 (×3): 25 mg via ORAL
  Filled 2023-06-14 (×2): qty 1
  Filled 2023-06-14: qty 5

## 2023-06-14 MED ORDER — CHLORDIAZEPOXIDE HCL 5 MG PO CAPS
25.0000 mg | ORAL_CAPSULE | ORAL | Status: DC
  Administered 2023-06-16: 25 mg via ORAL
  Filled 2023-06-14: qty 5

## 2023-06-14 MED ORDER — LORAZEPAM 1 MG PO TABS: 1.0000 mg | ORAL_TABLET | ORAL | Status: AC | PRN

## 2023-06-14 MED ORDER — CHLORDIAZEPOXIDE HCL 5 MG PO CAPS: 25.0000 mg | ORAL_CAPSULE | Freq: Every day | ORAL | Status: DC

## 2023-06-14 MED ORDER — LORAZEPAM 1 MG PO TABS
1.0000 mg | ORAL_TABLET | ORAL | Status: AC | PRN
  Administered 2023-06-14: 2 mg via ORAL
  Filled 2023-06-14 (×2): qty 2

## 2023-06-14 MED ORDER — LORAZEPAM 2 MG/ML IJ SOLN: 4.0000 mg | Freq: Once | INTRAMUSCULAR | Status: AC

## 2023-06-14 MED ORDER — CHLORDIAZEPOXIDE HCL 25 MG PO CAPS
25.0000 mg | ORAL_CAPSULE | Freq: Four times a day (QID) | ORAL | Status: AC
  Administered 2023-06-14 (×4): 25 mg via ORAL
  Filled 2023-06-14 (×4): qty 1

## 2023-06-14 MED ORDER — LORAZEPAM 2 MG/ML IJ SOLN
1.0000 mg | INTRAMUSCULAR | Status: AC | PRN
  Administered 2023-06-14: 2 mg via INTRAVENOUS
  Filled 2023-06-14: qty 2
  Filled 2023-06-14: qty 1

## 2023-06-14 MED ORDER — LORAZEPAM 2 MG/ML PO CONC: 4.0000 mg | Freq: Once | ORAL | Status: DC

## 2023-06-14 MED ORDER — LORAZEPAM 2 MG/ML IJ SOLN
INTRAMUSCULAR | Status: AC
  Administered 2023-06-14: 4 mg via INTRAVENOUS
  Filled 2023-06-14: qty 2

## 2023-06-14 MED ORDER — LORAZEPAM 2 MG/ML IJ SOLN
1.0000 mg | INTRAMUSCULAR | Status: AC | PRN
  Administered 2023-06-14: 3 mg via INTRAVENOUS
  Administered 2023-06-15: 4 mg via INTRAVENOUS
  Administered 2023-06-15: 1 mg via INTRAVENOUS
  Filled 2023-06-14 (×2): qty 2

## 2023-06-14 NOTE — Plan of Care (Signed)
  Problem: Skin Integrity: Goal: Risk for impaired skin integrity will decrease Outcome: Progressing   

## 2023-06-14 NOTE — Progress Notes (Signed)
Pt agitated & restless. [facility]. Pt standing by the doorway with sitter. Wants to get out of room. Pt says yes to feeling anxious.  Haldol prn given. Walked in the hall. Pt continues to be restless & agitated. Dr. Natale Milch notified. One time dose of 4mg  Ativan given. Pt still in recliner. Stable at this time. Dr. Natale Milch added Ativan prn & Librium. See MAR.

## 2023-06-14 NOTE — Progress Notes (Signed)
Pt walking in room. Wanted to walk in the hall. Pt wants to speak to patient advocate for our hospital & the [place]. Listened to patient's concerns & let patient know I would talk to charge RN. Pt says "I'm tired being in the bed, my feet are going numb." Suggested we could walk Or sit up in the recliner. Pt decided to head back to bed & sit on the edge for breakfast. Pt given ativan See MAR.

## 2023-06-14 NOTE — Progress Notes (Signed)
PROGRESS NOTE    [name]  RJJ:[id number] DOB: 1971/02/03 DOA: 06/09/2023 PCP: [facility]   Brief Narrative:  The Patient is a 52 year old Caucasian male with a past medical history significant for Brilinta anxiety, depression, PTSD, bipolar type II, alcohol abuse and dependence who presented to the ED seeking help for alcohol abuse problems.  Admites to 25 beers/day and expressing suicidal ideations - brought in by his significant other.  Confirms feeling suicidal but was unable to elaborate and subsequently started withdrawing with notable hallucinations.  Patient was initiated on CIWA protocol, Haldol as needed for agitation.  Patient ultimately evaluated by psych recommending further inpatient admission given suicidal ideation, he is currently IVC.  Patient continues to improve slowly in regards to withdrawals, plan to wean off Precedex and initiate Librium taper today with hopeful plan to discharge the next 24 to 48 hours with psychiatry pending placement per their expertise..   Assessment & Plan:   Principal Problem:   Alcohol withdrawal (HCC)    Alcohol Dependence and Intoxication with Withdrawal and Hallucinations, improving, POA -Continues to score on CIWA protocol but downtrending from prior, DC Precedex and transition to Librium taper, CIWA protocol overlapping with as needed benzodiazepines -Haldol as needed for profound agitation only   Suicidal Ideation  Hx of Bipolar Disorder, Anxiety and Depression, PTSD -Patient has confirmed suicidal ideation at intake, psych recommending further inpatient treatment at the [facility] per their expertise.  Likely plan for discharge to follow-up with their facility once medically stabe. -Patient reports poor follow-up, denies any prior psychiatric medication use despite prolonged history of anxiety depression and bipolar disorder   Hypokalemia, resolved -Follow labs as needed   Elevated AST, mild and improved,  reactive -Resolved, follow repeat labs as needed   THC Positive Tobacco abuse Alcohol abuse -Lengthy discussion in regards to cessation   Hyperbilirubinemia, transient  -Resolved  Hypoalbuminemia -Likely secondary to poor p.o. intake and alcohol abuse  DVT prophylaxis: enoxaparin (LOVENOX) injection 40 mg Start: 06/09/23 2200 Code Status:   Code Status: Full Code Family Communication: None present  Status is: Inpatient  Dispo: The patient is from: Home              Anticipated d/c is to: To be determined, likely inpatient psych              Anticipated d/c date is: 24 to 48 hours              Patient currently not medically stable for discharge  Consultants:  Psychiatry  Procedures:  None  Antimicrobials:  None  Subjective: No acute issues or events overnight, somewhat agitated this morning requesting to leave AMA which patient was again educated that he is currently under IVC and is unable to leave otherwise until cleared by psychiatry.  Denies nausea vomiting diarrhea constipation headache fevers chills chest pain shortness of breath  Objective: Vitals:   06/14/23 0200 06/14/23 0300 06/14/23 0400 06/14/23 0500  BP: 95/60 (!) 109/50 99/76 121/80  Pulse: 64 72 63 (!) 57  Resp: 15 (!) 21 (!) 8 15  Temp:  (!) 96.7 F (35.9 C)    TempSrc:  Axillary    SpO2: 97% 96% 97% 97%  Weight:      Height:        Intake/Output Summary (Last 24 hours) at 06/14/2023 0719 Last data filed at 06/14/2023 0400 Gross per 24 hour  Intake 450.47 ml  Output --  Net 450.47 ml   [employer]  06/09/23 1753 06/09/23 2216  Weight: 81.5 kg 68.7 kg    Examination:  General:  Pleasantly resting in bed, No acute distress.  Agitated with pressured speech HEENT:  Normocephalic atraumatic.  Sclerae nonicteric, noninjected.  Extraocular movements intact bilaterally. Neck:  Without mass or deformity.  Trachea is midline. Lungs:  Clear to auscultate bilaterally without rhonchi, wheeze, or  rales. Heart:  Regular rate and rhythm.  Without murmurs, rubs, or gallops. Abdomen:  Soft, nontender, nondistended.  Without guarding or rebound. Extremities: Without cyanosis, clubbing, edema, or obvious deformity. Skin:  Warm and dry, no erythema.   Data Reviewed: I have personally reviewed following labs and imaging studies  CBC: Recent Labs  Lab 06/09/23 1825 06/10/23 0259 06/11/23 0300 06/12/23 0310 06/13/23 0300 06/14/23 0258  WBC 7.4 7.6 4.9 5.4 6.3 6.3  NEUTROABS 2.5  --  2.6 2.8 3.4 2.9  HGB 16.4 14.4 13.3 15.3 15.3 15.4  HCT 48.8 43.6 40.5 45.8 46.8 47.2  MCV 80.4 82.4 82.3 81.5 81.7 83.0  PLT 236 238 154 159 166 157   Basic Metabolic Panel: Recent Labs  Lab 06/10/23 0259 06/11/23 0300 06/12/23 0310 06/13/23 0300 06/14/23 0258  NA 140 138 138 135 134*  K 3.8 3.8 3.8 3.6 3.9  CL 101 106 105 102 102  CO2 26 23 24 24 22   GLUCOSE 86 118* 116* 102* 119*  BUN 9 11 10 13 11   CREATININE 0.63 0.77 0.61 0.71 0.70  CALCIUM 8.3* 8.6* 9.1 9.1 9.1  MG 2.2 2.3 2.1 2.0 2.2  PHOS 3.9 3.1 3.9 4.4 4.2   GFR: Estimated Creatinine Clearance: 106.2 mL/min (by C-G formula based on SCr of 0.7 mg/dL). Liver Function Tests: Recent Labs  Lab 06/10/23 0259 06/11/23 0300 06/12/23 0310 06/13/23 0300 06/14/23 0258  AST 39 37 42* 38 33  ALT 33 30 33 34 34  ALKPHOS 53 55 63 62 62  BILITOT 0.9 1.3* 0.6 0.9 0.8  PROT 7.0 6.7 7.4 7.2 7.5  ALBUMIN 3.7 3.3* 3.7 3.8 3.8   No results for input(s): "LIPASE", "AMYLASE" in the last 168 hours. Recent Labs  Lab 06/10/23 1036  AMMONIA 25   Recent Results (from the past 240 hour(s))  MRSA Next Gen by PCR, Nasal     Status: None   Collection Time: 06/09/23 10:02 PM   Specimen: Nasal Mucosa; Nasal Swab  Result Value Ref Range Status   MRSA by PCR Next Gen NOT DETECTED NOT DETECTED Final    Comment: (NOTE) The GeneXpert MRSA Assay (FDA approved for NASAL specimens only), is one component of a comprehensive MRSA colonization  surveillance program. It is not intended to diagnose MRSA infection nor to guide or monitor treatment for MRSA infections. Test performance is not FDA approved in patients less than 45 years old. Performed at [facility], [address] [place], [place] [address]     Radiology Studies: No results found.  Scheduled Meds:  Chlorhexidine Gluconate Cloth  6 each Topical Daily   enoxaparin (LOVENOX) injection  40 mg Subcutaneous Q24H   folic acid  1 mg Oral Daily   multivitamin with minerals  1 tablet Oral Daily   nicotine  14 mg Transdermal Daily   sodium chloride flush  3 mL Intravenous Q12H   thiamine  100 mg Oral Daily   Continuous Infusions:  dexmedetomidine (PRECEDEX) IV infusion 0.7 mcg/kg/hr (06/14/23 0400)     LOS: 5 days   Time spent:  Azucena Fallen, DO [facility]  If 7PM-7AM, please contact night-coverage [email or web address]  06/14/2023, 7:19 AM

## 2023-06-14 NOTE — Plan of Care (Signed)
  Problem: Health Behavior/Discharge Planning: Goal: Ability to manage health-related needs will improve Outcome: Not Progressing   Problem: Safety: Goal: Ability to remain free from injury will improve Outcome: Not Progressing   

## 2023-06-15 DIAGNOSIS — R45851 Suicidal ideations: Secondary | ICD-10-CM | POA: Diagnosis not present

## 2023-06-15 DIAGNOSIS — F32A Depression, unspecified: Secondary | ICD-10-CM

## 2023-06-15 DIAGNOSIS — F10931 Alcohol use, unspecified with withdrawal delirium: Secondary | ICD-10-CM | POA: Diagnosis not present

## 2023-06-15 LAB — VITAMIN B1: Vitamin B1 (Thiamine): 134.4 nmol/L (ref 66.5–200.0)

## 2023-06-15 MED ORDER — POLYVINYL ALCOHOL 1.4 % OP SOLN
1.0000 [drp] | OPHTHALMIC | Status: DC | PRN
  Administered 2023-06-15 – 2023-06-16 (×2): 1 [drp] via OPHTHALMIC
  Filled 2023-06-15: qty 15

## 2023-06-15 MED ORDER — LORAZEPAM 2 MG/ML IJ SOLN
1.0000 mg | INTRAMUSCULAR | Status: AC | PRN
  Administered 2023-06-16: 1 mg via INTRAVENOUS
  Filled 2023-06-15: qty 1

## 2023-06-15 MED ORDER — LORAZEPAM 1 MG PO TABS
1.0000 mg | ORAL_TABLET | ORAL | Status: AC | PRN
  Administered 2023-06-15 – 2023-06-16 (×3): 1 mg via ORAL
  Filled 2023-06-15 (×3): qty 1

## 2023-06-15 MED ORDER — TRAZODONE HCL 100 MG PO TABS
100.0000 mg | ORAL_TABLET | Freq: Every day | ORAL | Status: DC
  Administered 2023-06-15: 100 mg via ORAL
  Filled 2023-06-15: qty 1

## 2023-06-15 NOTE — TOC Progression Note (Addendum)
Transition of Care [facility]) - Progression Note    Patient Details  Name: IVOR BLOYD MRN: [id number] Date of Birth: [birth year]  Transition of Care [facility]) CM/SW Contact  Lavenia Atlas, RN Phone Number: 06/15/2023, 12:06 PM  Clinical Narrative:   This RNCM contacted [facility], awaiting a call back. TOC for SA resources which have been attached to AVS, unsure if patient needs inpt psych or residential psych.   - 12:26pm Per secure chat psych to see patient today or tomorrow for psych recommendation.   - 12:42 Received call back from April w/[facility] who reports patient's [facility] info is as follows: Kathryne Sharper Team/ Primary: Brett AlbinoRosary Lively [phone] ext [phone] SA referrals: SWGarner Nash [phone] ext [phone] or Jilda Roche [phone] ext [phone]. This RNCM left voicemail message for Natalia Leatherwood, awaiting a call back re: SA referrals/dc options.   TOC will continue to follow for needs.  - 1: 08 pm Received call back from [place] w/ [place] re: SA referral. Natalia Leatherwood reports for residential substance abuse patient will need to present to [facility] walk in clinic Mon-Wed between 8:30am-11:30am, post discharge.  TOC following for needs  Expected Discharge Plan: (P)  (unknown at this time) Barriers to Discharge: Continued Medical Work up  Expected Discharge Plan and Services In-house Referral: NA Discharge Planning Services: CM Consult Post Acute Care Choice:  (inpt psych) Living arrangements for the past 2 months: Single Family Home                 DME Arranged: N/A DME Agency: NA       HH Arranged: NA HH Agency: NA         Social Determinants of Health (SDOH) Interventions SDOH Screenings   Alcohol Screen: Medium Risk (02/16/2018)  Social Connections: Unknown (01/13/2023)   Received from [facility]  Tobacco Use: High Risk (06/09/2023)    Readmission Risk Interventions    06/15/2023   12:01 PM 06/11/2023   12:22 PM   Readmission Risk Prevention Plan  Transportation Screening Complete Complete  PCP or Specialist Appt within 5-7 Days Complete Complete  Home Care Screening Complete Complete  Medication Review (RN CM) Complete Complete

## 2023-06-15 NOTE — Progress Notes (Signed)
PROGRESS NOTE    [name]  ION:[id number] DOB: [birth year] DOA: 06/09/2023 PCP: [facility]   Brief Narrative:  The Patient is a 52 year old Caucasian male with a past medical history significant for Brilinta anxiety, depression, PTSD, bipolar type II, alcohol abuse and dependence who presented to the ED seeking help for alcohol abuse problems.  Admites to 25 beers/day and expressing suicidal ideations - brought in by his significant other.  Confirms feeling suicidal but was unable to elaborate and subsequently started withdrawing with notable hallucinations.  Patient was initiated on CIWA protocol, Haldol as needed for agitation.  Patient ultimately evaluated by psych recommending further inpatient admission given suicidal ideation, he is currently IVC.  Patient continues to improve slowly in regards to withdrawals, now off Precedex and continue Librium taper today with hopeful plan to discharge the next 24 to 48 hours depending on further psychiatry evaluation.  Appreciate their insight and expertise.  Assessment & Plan: Principal Problem:   Alcohol withdrawal (HCC) Active Problems:   Depression with suicidal ideation  Alcohol Dependence and Intoxication with Withdrawal and Hallucinations, resolving, POA -Continues to score on CIWA protocol overnight but downtrending from prior -Off Precedex and continue Librium taper -CIWA protocol overlapping with as needed benzodiazepines -Haldol as needed for profound agitation only   Suicidal Ideation  Hx of Bipolar Disorder, Anxiety and Depression, PTSD -Patient has confirmed suicidal ideation at intake, psych recommending further inpatient treatment at the [facility] per their expertise.  Likely plan for discharge to follow-up with their facility once medically stabe. -Patient reports poor follow-up, denies any prior psychiatric medication use despite prolonged history of anxiety depression and bipolar disorder   Hypokalemia,  resolved -Follow labs as appropriate   Elevated AST, mild and improved, reactive -Resolved, follow repeat labs as needed   THC Positive Tobacco abuse Alcohol abuse -Lengthy discussion in regards to cessation   Hyperbilirubinemia, transient  -Resolved  Hypoalbuminemia -Likely secondary to poor p.o. intake and alcohol abuse  DVT prophylaxis: enoxaparin (LOVENOX) injection 40 mg Start: 06/09/23 2200 Code Status:   Code Status: Full Code Family Communication: None present  Status is: Inpatient  Dispo: The patient is from: Home              Anticipated d/c is to: To be determined              Anticipated d/c date is: 24 to 48 hours              Patient currently not medically stable for discharge still requiring close monitoring in the setting of alcohol withdrawals  Consultants:  Psychiatry  Procedures:  None  Antimicrobials:  None  Subjective: No acute issues or events overnight, patient more reasonable and agreeable for conversation this morning, continues to voice that he would like to leave the hospital but we continue to remind the patient that he is currently under involuntary commitment and until psychiatry team is able to revoke his IVC he will need to remain in the hospital.  Review of systems otherwise unremarkable other than ongoing anxiety and claustrophobia.  Objective: Vitals:   06/15/23 0025 06/15/23 0100 06/15/23 0130 06/15/23 0541  BP: (!) 119/95 (!) 137/103 116/70   Pulse: (!) 109  89   Resp:    16  Temp:    97.8 F (36.6 C)  TempSrc:      SpO2:      Weight:      Height:        Intake/Output Summary (Last  24 hours) at 06/15/2023 0840 Last data filed at 06/14/2023 1534 Gross per 24 hour  Intake 230.08 ml  Output --  Net 230.08 ml   Filed Weights   06/09/23 1753 06/09/23 2216  Weight: 81.5 kg 68.7 kg    Examination:  General: Pleasantly resting in bed, No acute distress.  Agitated with pressured speech HEENT: Normocephalic atraumatic.   Sclerae nonicteric, noninjected.  Extraocular movements intact bilaterally. Neck: Without mass or deformity.  Trachea is midline. Lungs: Clear to auscultate bilaterally without rhonchi, wheeze, or rales. Heart: Regular rate and rhythm.  Without murmurs, rubs, or gallops. Abdomen: Soft, nontender, nondistended.  Without guarding or rebound. Extremities: Without cyanosis, clubbing, edema, or obvious deformity. Skin: Warm and dry, no erythema.   Data Reviewed: I have personally reviewed following labs and imaging studies  CBC: Recent Labs  Lab 06/09/23 1825 06/10/23 0259 06/11/23 0300 06/12/23 0310 06/13/23 0300 06/14/23 0258  WBC 7.4 7.6 4.9 5.4 6.3 6.3  NEUTROABS 2.5  --  2.6 2.8 3.4 2.9  HGB 16.4 14.4 13.3 15.3 15.3 15.4  HCT 48.8 43.6 40.5 45.8 46.8 47.2  MCV 80.4 82.4 82.3 81.5 81.7 83.0  PLT 236 238 154 159 166 157   Basic Metabolic Panel: Recent Labs  Lab 06/10/23 0259 06/11/23 0300 06/12/23 0310 06/13/23 0300 06/14/23 0258  NA 140 138 138 135 134*  K 3.8 3.8 3.8 3.6 3.9  CL 101 106 105 102 102  CO2 26 23 24 24 22   GLUCOSE 86 118* 116* 102* 119*  BUN 9 11 10 13 11   CREATININE 0.63 0.77 0.61 0.71 0.70  CALCIUM 8.3* 8.6* 9.1 9.1 9.1  MG 2.2 2.3 2.1 2.0 2.2  PHOS 3.9 3.1 3.9 4.4 4.2   GFR: Estimated Creatinine Clearance: 106.2 mL/min (by C-G formula based on SCr of 0.7 mg/dL). Liver Function Tests: Recent Labs  Lab 06/10/23 0259 06/11/23 0300 06/12/23 0310 06/13/23 0300 06/14/23 0258  AST 39 37 42* 38 33  ALT 33 30 33 34 34  ALKPHOS 53 55 63 62 62  BILITOT 0.9 1.3* 0.6 0.9 0.8  PROT 7.0 6.7 7.4 7.2 7.5  ALBUMIN 3.7 3.3* 3.7 3.8 3.8   No results for input(s): "LIPASE", "AMYLASE" in the last 168 hours. Recent Labs  Lab 06/10/23 1036  AMMONIA 25   Recent Results (from the past 240 hour(s))  MRSA Next Gen by PCR, Nasal     Status: None   Collection Time: 06/09/23 10:02 PM   Specimen: Nasal Mucosa; Nasal Swab  Result Value Ref Range Status   MRSA  by PCR Next Gen NOT DETECTED NOT DETECTED Final    Comment: (NOTE) The GeneXpert MRSA Assay (FDA approved for NASAL specimens only), is one component of a comprehensive MRSA colonization surveillance program. It is not intended to diagnose MRSA infection nor to guide or monitor treatment for MRSA infections. Test performance is not FDA approved in patients less than 80 years old. Performed at [facility], [address], [place], [place] [address]     Radiology Studies: No results found.  Scheduled Meds:  chlordiazePOXIDE  25 mg Oral TID   Followed by   Melene Muller ON 06/16/2023] chlordiazePOXIDE  25 mg Oral BH-qamhs   Followed by   Melene Muller ON 06/17/2023] chlordiazePOXIDE  25 mg Oral Daily   Chlorhexidine Gluconate Cloth  6 each Topical Daily   enoxaparin (LOVENOX) injection  40 mg Subcutaneous Q24H   folic acid  1 mg Oral Daily   LORazepam  4  mg Oral Once   multivitamin with minerals  1 tablet Oral Daily   nicotine  14 mg Transdermal Daily   sodium chloride flush  3 mL Intravenous Q12H   thiamine  100 mg Oral Daily   Continuous Infusions:     LOS: 6 days   Time spent:  Azucena Fallen, DO [facility]  If 7PM-7AM, please contact night-coverage [email or web address]  06/15/2023, 8:40 AM

## 2023-06-15 NOTE — Progress Notes (Signed)
Physical Therapy Treatment Patient Details Name: [name] MRN: [id number] DOB: 06-26-1971 Today's Date: 06/15/2023   History of Present Illness [name] is a 52 yr old male admitted to the hospital due to ETOH withdrawal and alcohol delirium; per his medical chart, he had been consuming ~25 beers a day.  Additionally, pt with suicidal ideations. PMH: anxiety, depression, PTSD, bipolar disorder, ETOH abuse    PT Comments  Pt making excellent progress with goals met.  He has been ambulating independently in room and demonstrated safely with therapy.  He scored a 24/24 on the DGI indicated excellent balance/low fall risk.  Pt has met goals.  No further PT needs - pt agrees.    If plan is discharge home, recommend the following:     Can travel by private vehicle        Equipment Recommendations  None recommended by PT    Recommendations for Other Services       Precautions / Restrictions Precautions Precautions: None     Mobility  Bed Mobility Overal bed mobility: Independent             General bed mobility comments: Pt standing at arrival    Transfers Overall transfer level: Independent Equipment used: None Transfers: Sit to/from Stand Sit to Stand: Independent                Ambulation/Gait Ambulation/Gait assistance: Independent Gait Distance (Feet): 400 Feet Assistive device: None Gait Pattern/deviations: WFL(Within Functional Limits) Gait velocity: normal     General Gait Details: Pt has been ambulating in room on his own and hallway with nursing.  He demonstrated 400' with normal gait and excellent balance   Stairs             Wheelchair Mobility     Tilt Bed    Modified Rankin (Stroke Patients Only)       Balance Overall balance assessment: Independent   Sitting balance-Leahy Scale: Normal       Standing balance-Leahy Scale: Normal                 High Level Balance Comments: Pt jogged in place a couple  steps, 24/24 on DGI, carried telemetry and hooked back up himself Standardized Balance Assessment Standardized Balance Assessment : Dynamic Gait Index   Dynamic Gait Index Level Surface: Normal Change in Gait Speed: Normal Gait with Horizontal Head Turns: Normal Gait with Vertical Head Turns: Normal Gait and Pivot Turn: Normal Step Over Obstacle: Normal Step Around Obstacles: Normal Steps: Normal Total Score: 24      Cognition Arousal: Alert Behavior During Therapy: WFL for tasks assessed/performed Overall Cognitive Status: Within Functional Limits for tasks assessed                                 General Comments: Pleasnat , motivated, agreeable today        Exercises      General Comments        Pertinent Vitals/Pain Pain Assessment Pain Assessment: No/denies pain    Home Living                          Prior Function            PT Goals (current goals can now be found in the care plan section) Acute Rehab PT Goals PT Goal Formulation: All assessment and education complete, DC therapy  Progress towards PT goals: Goals met/education completed, patient discharged from PT    Frequency           PT Plan      Co-evaluation              AM-PAC PT "6 Clicks" Mobility   Outcome Measure  Help needed turning from your back to your side while in a flat bed without using bedrails?: None Help needed moving from lying on your back to sitting on the side of a flat bed without using bedrails?: None Help needed moving to and from a bed to a chair (including a wheelchair)?: None Help needed standing up from a chair using your arms (e.g., wheelchair or bedside chair)?: None Help needed to walk in hospital room?: None Help needed climbing 3-5 steps with a railing? : None 6 Click Score: 24    End of Session   Activity Tolerance: Patient tolerated treatment well Patient left: Other (comment) (standing in room, sitter present) Nurse  Communication: Mobility status       Time: 5621-3086 PT Time Calculation (min) (ACUTE ONLY): 8 min  Charges:    $Gait Training: 8-22 mins PT General Charges $$ ACUTE PT VISIT: 1 Visit                     Anise Salvo, PT Acute Rehab Services [facility] [phone]    Rayetta Humphrey 06/15/2023, 4:21 PM

## 2023-06-16 ENCOUNTER — Other Ambulatory Visit (HOSPITAL_COMMUNITY): Payer: Self-pay

## 2023-06-16 DIAGNOSIS — F10931 Alcohol use, unspecified with withdrawal delirium: Secondary | ICD-10-CM | POA: Diagnosis not present

## 2023-06-16 LAB — CREATININE, SERUM
Creatinine, Ser: 0.8 mg/dL (ref 0.61–1.24)
GFR, Estimated: >60 mL/min (ref >60)

## 2023-06-16 MED ORDER — ADULT MULTIVITAMIN W/MINERALS CH
1.0000 | ORAL_TABLET | Freq: Every day | ORAL | 0 refills | Status: DC
  Filled 2023-06-16: qty 130, 130d supply, fill #0

## 2023-06-16 MED ORDER — VITAMIN B-1 100 MG PO TABS
100.0000 mg | ORAL_TABLET | Freq: Every day | ORAL | 0 refills | Status: DC
  Filled 2023-06-16: qty 30, 30d supply, fill #0

## 2023-06-16 MED ORDER — FOLIC ACID 1 MG PO TABS
1.0000 mg | ORAL_TABLET | Freq: Every day | ORAL | 0 refills | Status: DC
  Filled 2023-06-16: qty 30, 30d supply, fill #0

## 2023-06-16 MED ORDER — NICOTINE 14 MG/24HR TD PT24
14.0000 mg | MEDICATED_PATCH | Freq: Every day | TRANSDERMAL | 0 refills | Status: DC
  Filled 2023-06-16: qty 28, 28d supply, fill #0

## 2023-06-16 MED ORDER — CHLORDIAZEPOXIDE HCL 25 MG PO CAPS
ORAL_CAPSULE | ORAL | 0 refills | Status: AC
  Filled 2023-06-16: qty 4, 3d supply, fill #0

## 2023-06-16 NOTE — Plan of Care (Signed)
  Problem: Education: Goal: Knowledge of General Education information will improve Description: Including pain rating scale, medication(s)/side effects and non-pharmacologic comfort measures 06/16/2023 0349 by Elder Cyphers, RN Outcome: Progressing 06/16/2023 0349 by Elder Cyphers, RN Outcome: Progressing 06/16/2023 0349 by Elder Cyphers, RN Outcome: Progressing   Problem: Health Behavior/Discharge Planning: Goal: Ability to manage health-related needs will improve 06/16/2023 0349 by Elder Cyphers, RN Outcome: Progressing 06/16/2023 0349 by Elder Cyphers, RN Outcome: Progressing 06/16/2023 0349 by Elder Cyphers, RN Outcome: Progressing   Problem: Clinical Measurements: Goal: Ability to maintain clinical measurements within normal limits will improve 06/16/2023 0349 by Elder Cyphers, RN Outcome: Progressing 06/16/2023 0349 by Elder Cyphers, RN Outcome: Progressing 06/16/2023 0349 by Elder Cyphers, RN Outcome: Progressing Goal: Diagnostic test results will improve 06/16/2023 0349 by Elder Cyphers, RN Outcome: Progressing 06/16/2023 0349 by Elder Cyphers, RN Outcome: Progressing 06/16/2023 0349 by Elder Cyphers, RN Outcome: Progressing Goal: Respiratory complications will improve 06/16/2023 0349 by Elder Cyphers, RN Outcome: Progressing 06/16/2023 0349 by Elder Cyphers, RN Outcome: Progressing 06/16/2023 0349 by Elder Cyphers, RN Outcome: Progressing Goal: Cardiovascular complication will be avoided 06/16/2023 0349 by Elder Cyphers, RN Outcome: Progressing 06/16/2023 0349 by Elder Cyphers, RN Outcome: Progressing 06/16/2023 0349 by Elder Cyphers, RN Outcome: Progressing   Problem: Activity: Goal: Risk for activity intolerance will decrease 06/16/2023 0349 by Elder Cyphers, RN Outcome: Progressing 06/16/2023 0349 by Elder Cyphers, RN Outcome: Progressing 06/16/2023 0349 by Elder Cyphers, RN Outcome: Progressing    Problem: Nutrition: Goal: Adequate nutrition will be maintained 06/16/2023 0349 by Elder Cyphers, RN Outcome: Progressing 06/16/2023 0349 by Elder Cyphers, RN Outcome: Progressing 06/16/2023 0349 by Elder Cyphers, RN Outcome: Progressing   Problem: Coping: Goal: Level of anxiety will decrease 06/16/2023 0349 by Elder Cyphers, RN Outcome: Progressing 06/16/2023 0349 by Elder Cyphers, RN Outcome: Progressing 06/16/2023 0349 by Elder Cyphers, RN Outcome: Progressing   Problem: Elimination: Goal: Will not experience complications related to bowel motility 06/16/2023 0349 by Elder Cyphers, RN Outcome: Progressing 06/16/2023 0349 by Elder Cyphers, RN Outcome: Progressing 06/16/2023 0349 by Elder Cyphers, RN Outcome: Progressing Goal: Will not experience complications related to urinary retention 06/16/2023 0349 by Elder Cyphers, RN Outcome: Progressing 06/16/2023 0349 by Elder Cyphers, RN Outcome: Progressing 06/16/2023 0349 by Elder Cyphers, RN Outcome: Progressing   Problem: Pain Managment: Goal: General experience of comfort will improve 06/16/2023 0349 by Elder Cyphers, RN Outcome: Progressing 06/16/2023 0349 by Elder Cyphers, RN Outcome: Progressing 06/16/2023 0349 by Elder Cyphers, RN Outcome: Progressing   Problem: Safety: Goal: Ability to remain free from injury will improve 06/16/2023 0349 by Elder Cyphers, RN Outcome: Progressing 06/16/2023 0349 by Elder Cyphers, RN Outcome: Progressing 06/16/2023 0349 by Elder Cyphers, RN Outcome: Progressing   Problem: Skin Integrity: Goal: Risk for impaired skin integrity will decrease 06/16/2023 0349 by Elder Cyphers, RN Outcome: Progressing 06/16/2023 0349 by Elder Cyphers, RN Outcome: Progressing 06/16/2023 0349 by Elder Cyphers, RN Outcome: Progressing

## 2023-06-16 NOTE — Progress Notes (Signed)
OT Cancellation Note  Patient Details Name: ILYAAS KNACK MRN: [id number] DOB: Nov 13, 1970   Cancelled Treatment:    Reason Eval/Treat Not Completed: OT screened, no needs identified, will sign off Patient reported being able to do "jumping jacks" and all ADLs himself. OT to sign off.  Rosalio Loud, MS Acute Rehabilitation Department Office# [phone]  06/16/2023, 9:44 AM

## 2023-06-16 NOTE — TOC Transition Note (Signed)
Transition of Care [facility]) - CM/SW Discharge Note   Patient Details  Name: [name] MRN: [id number] Date of Birth: [birth year]  Transition of Care [facility]) CM/SW Contact:  Harriett Sine, RN Phone Number:[phone]  06/16/2023, 1:02 PM   Clinical Narrative:    Spoke with pt at bedside about d/c with self care, rescinding IVC, and [facility]   Pt states he will be seeking rehab care and medication refill [facility] [place].  Pt states his friend will be transporting him home and to the [place] for rehab program. CM answered pt questions about medications and d/c time. Pt was informed the the IVC was rescinded today.   Final next level of care: Home/Self Care Barriers to Discharge: No Barriers Identified   Patient Goals and CMS Choice CMS Medicare.gov Compare Post Acute Care list provided to:: Patient Choice offered to / list presented to : NA  Discharge Placement                      Patient and family notified of of transfer: 06/16/23  Discharge Plan and Services Additional resources added to the After Visit Summary for   In-house Referral: NA Discharge Planning Services: CM Consult Post Acute Care Choice:  (inpt psych)          DME Arranged: N/A DME Agency: NA       HH Arranged: NA HH Agency: NA        Social Determinants of Health (SDOH) Interventions SDOH Screenings   Alcohol Screen: Medium Risk (02/16/2018)  Social Connections: Unknown (01/13/2023)   Received from [facility]  Tobacco Use: High Risk (06/09/2023)     Readmission Risk Interventions    06/15/2023   12:01 PM 06/11/2023   12:22 PM  Readmission Risk Prevention Plan  Transportation Screening Complete Complete  PCP or Specialist Appt within 5-7 Days Complete Complete  Home Care Screening Complete Complete  Medication Review (RN CM) Complete Complete

## 2023-06-16 NOTE — Discharge Summary (Signed)
Physician Discharge Summary  [name] ZOX:[id number] DOB: 04/27/1971 DOA: 06/09/2023  PCP: [facility]  Admit date: 06/09/2023 Discharge date: 06/16/2023  Admitted From: Home Disposition: Home  Recommendations for Outpatient Follow-up:  Follow up with PCP in 1-2 weeks [facility]  Discharge Condition: Stable CODE STATUS: Full Diet recommendation: As tolerated  Brief/Interim Summary: The Patient is a 52 year old Caucasian male with a past medical history significant for Brilinta anxiety, depression, PTSD, bipolar type II, alcohol abuse and dependence who presented to the ED seeking help for alcohol abuse problems.  Admites to 25 beers/day and expressing suicidal ideations - brought in by his significant other.  Confirms feeling suicidal but was unable to elaborate and subsequently started withdrawing with notable hallucinations.  Patient was initiated on CIWA protocol, Haldol as needed for agitation.  Patient ultimately evaluated by psych recommending further inpatient admission given suicidal ideation, initially under IVC.  Over the past 48 hours patient symptoms continue to improve, titrated off Precedex on Librium taper.  No longer scoring on CIWA protocol, no need for further Haldol doses.  Psychiatry able to evaluate patient recommending inpatient residential rehab at the [place], he does not require inpatient psychiatric care as such IVC has been discontinued.  Patient is known history of suicidal ideation bipolar disorder anxiety depression PTSD appear to be poorly controlled but patient is refusing any further medical care at this time.  His other chronic comorbid conditions as well as labs have stabilized and he is otherwise stable and agreeable for discharge home with close follow-up outpatient as discussed.   Discharge Diagnoses:  Principal Problem:   Alcohol withdrawal (HCC) Active Problems:   Depression with suicidal  ideation    Discharge Instructions   Allergies as of 06/16/2023       Reactions   Effexor [venlafaxine] Hives   Nicoderm [nicotine] Rash   Paxil [paroxetine Hcl] Hives, Rash        Medication List     TAKE these medications    CertaVite/Antioxidants Tabs Take 1 tablet by mouth daily. Start taking on: June 17, 2023   chlordiazePOXIDE 25 MG capsule Commonly known as: LIBRIUM Take 1 capsule (25 mg total) by mouth 2 (two) times daily for 1 day, THEN 1 capsule (25 mg total) daily for 2 days. Start taking on: June 16, 2023   folic acid 1 MG tablet Commonly known as: FOLVITE Take 1 tablet (1 mg total) by mouth daily. Start taking on: June 17, 2023   nicotine 14 mg/24hr patch Commonly known as: NICODERM CQ - dosed in mg/24 hours Place 1 patch (14 mg total) onto the skin daily. Start taking on: June 17, 2023   thiamine 100 MG tablet Commonly known as: VITAMIN B1 Take 1 tablet (100 mg total) by mouth daily. Start taking on: June 17, 2023        Follow-up Information     [facility].   Why: Inpatient rehab Contact information: [address] [facility] [place] [address] [phone]                Allergies  Allergen Reactions   Effexor [Venlafaxine] Hives   Nicoderm [Nicotine] Rash   Paxil [Paroxetine Hcl] Hives and Rash    Consultations: Psychiatry   Procedures/Studies: No results found.   Subjective: No acute issues or events overnight denies nausea vomiting diarrhea constipation fever chills or chest pain   Discharge Exam: Vitals:   06/15/23 2353 06/16/23 0334  BP: 100/66  106/68  Pulse: 98 85  Resp: 16 17  Temp: 98 F (36.7 C) 97.9 F (36.6 C)  SpO2: 96% 98%   Vitals:   06/15/23 1628 06/15/23 1946 06/15/23 2353 06/16/23 0334  BP: (!) 127/100 134/88 100/66 106/68  Pulse: (!) 101 (!) 106 98 85  Resp: 16 16 16 17   Temp: 98.2 F (36.8 C) 98.5 F (36.9 C) 98 F (36.7  C) 97.9 F (36.6 C)  TempSrc: Oral  Oral   SpO2: 99% 100% 96% 98%  Weight:      Height:        General: Pt is alert, awake, not in acute distress Cardiovascular: RRR, S1/S2 +, no rubs, no gallops Respiratory: CTA bilaterally, no wheezing, no rhonchi Abdominal: Soft, NT, ND, bowel sounds + Extremities: no edema, no cyanosis    The results of significant diagnostics from this hospitalization (including imaging, microbiology, ancillary and laboratory) are listed below for reference.     Microbiology: Recent Results (from the past 240 hour(s))  MRSA Next Gen by PCR, Nasal     Status: None   Collection Time: 06/09/23 10:02 PM   Specimen: Nasal Mucosa; Nasal Swab  Result Value Ref Range Status   MRSA by PCR Next Gen NOT DETECTED NOT DETECTED Final    Comment: (NOTE) The GeneXpert MRSA Assay (FDA approved for NASAL specimens only), is one component of a comprehensive MRSA colonization surveillance program. It is not intended to diagnose MRSA infection nor to guide or monitor treatment for MRSA infections. Test performance is not FDA approved in patients less than 68 years old. Performed at [facility], [address], [place], [place] [address]      Labs: BNP (last 3 results) No results for input(s): "BNP" in the last 8760 hours. Basic Metabolic Panel: Recent Labs  Lab 06/10/23 0259 06/11/23 0300 06/12/23 0310 06/13/23 0300 06/14/23 0258 06/16/23 0309  NA 140 138 138 135 134*  --   K 3.8 3.8 3.8 3.6 3.9  --   CL 101 106 105 102 102  --   CO2 26 23 24 24 22   --   GLUCOSE 86 118* 116* 102* 119*  --   BUN 9 11 10 13 11   --   CREATININE 0.63 0.77 0.61 0.71 0.70 0.80  CALCIUM 8.3* 8.6* 9.1 9.1 9.1  --   MG 2.2 2.3 2.1 2.0 2.2  --   PHOS 3.9 3.1 3.9 4.4 4.2  --    Liver Function Tests: Recent Labs  Lab 06/10/23 0259 06/11/23 0300 06/12/23 0310 06/13/23 0300 06/14/23 0258  AST 39 37 42* 38 33  ALT 33 30 33 34 34  ALKPHOS 53 55 63 62 62   BILITOT 0.9 1.3* 0.6 0.9 0.8  PROT 7.0 6.7 7.4 7.2 7.5  ALBUMIN 3.7 3.3* 3.7 3.8 3.8   No results for input(s): "LIPASE", "AMYLASE" in the last 168 hours. Recent Labs  Lab 06/10/23 1036  AMMONIA 25   CBC: Recent Labs  Lab 06/09/23 1825 06/10/23 0259 06/11/23 0300 06/12/23 0310 06/13/23 0300 06/14/23 0258  WBC 7.4 7.6 4.9 5.4 6.3 6.3  NEUTROABS 2.5  --  2.6 2.8 3.4 2.9  HGB 16.4 14.4 13.3 15.3 15.3 15.4  HCT 48.8 43.6 40.5 45.8 46.8 47.2  MCV 80.4 82.4 82.3 81.5 81.7 83.0  PLT 236 238 154 159 166 157   Cardiac Enzymes: No results for input(s): "CKTOTAL", "CKMB", "CKMBINDEX", "TROPONINI" in the last 168 hours. BNP: Invalid input(s): "POCBNP" CBG: No results  for input(s): "GLUCAP" in the last 168 hours. D-Dimer No results for input(s): "DDIMER" in the last 72 hours. Hgb A1c No results for input(s): "HGBA1C" in the last 72 hours. Lipid Profile No results for input(s): "CHOL", "HDL", "LDLCALC", "TRIG", "CHOLHDL", "LDLDIRECT" in the last 72 hours. Thyroid function studies No results for input(s): "TSH", "T4TOTAL", "T3FREE", "THYROIDAB" in the last 72 hours.  Invalid input(s): "FREET3" Anemia work up No results for input(s): "VITAMINB12", "FOLATE", "FERRITIN", "TIBC", "IRON", "RETICCTPCT" in the last 72 hours. Urinalysis    Component Value Date/Time   COLORURINE STRAW (A) 03/24/2023 0836   APPEARANCEUR CLEAR 03/24/2023 0836   LABSPEC 1.004 (L) 03/24/2023 0836   PHURINE 5.0 03/24/2023 0836   GLUCOSEU NEGATIVE 03/24/2023 0836   HGBUR NEGATIVE 03/24/2023 0836   BILIRUBINUR NEGATIVE 03/24/2023 0836   KETONESUR NEGATIVE 03/24/2023 0836   PROTEINUR NEGATIVE 03/24/2023 0836   NITRITE NEGATIVE 03/24/2023 0836   LEUKOCYTESUR NEGATIVE 03/24/2023 0836   Sepsis Labs Recent Labs  Lab 06/11/23 0300 06/12/23 0310 06/13/23 0300 06/14/23 0258  WBC 4.9 5.4 6.3 6.3   Microbiology Recent Results (from the past 240 hour(s))  MRSA Next Gen by PCR, Nasal     Status: None    Collection Time: 06/09/23 10:02 PM   Specimen: Nasal Mucosa; Nasal Swab  Result Value Ref Range Status   MRSA by PCR Next Gen NOT DETECTED NOT DETECTED Final    Comment: (NOTE) The GeneXpert MRSA Assay (FDA approved for NASAL specimens only), is one component of a comprehensive MRSA colonization surveillance program. It is not intended to diagnose MRSA infection nor to guide or monitor treatment for MRSA infections. Test performance is not FDA approved in patients less than 43 years old. Performed at [facility], [address] [place], [place] [address]      Time coordinating discharge: Over 30 minutes  SIGNED:   Azucena Fallen, DO [facility] 06/16/2023, 3:10 PM Pager   If 7PM-7AM, please contact night-coverage [email or web address]

## 2023-06-16 NOTE — Consult Note (Signed)
[facility] Face-to-Face Psychiatry Consult   Reason for Consult:  Suicidal ideations Referring Physician:  Dr. Marland Mcalpine Patient Identification: [name] MRN:  [id number] Principal Diagnosis: Alcohol withdrawal (HCC) Diagnosis:  Principal Problem:   Alcohol withdrawal (HCC) Active Problems:   Depression with suicidal ideation  Total Time Spent in Direct Patient Care:  I personally spent 40 minutes on the unit in direct patient care. The direct patient care time included face-to-face time with the patient, reviewing the patient's chart, communicating with other professionals, and coordinating care. Greater than 50% of this time was spent in counseling or coordinating care with the patient regarding goals of hospitalization, psycho-education, and discharge planning needs.   HPI:   [name] is a 52 y.o. male patient admitted with alcohol withdrawal. On arrival he admits to withdrawal symptoms of shakes, chills, bodyaches, restlessness, agitation and suicidal thoughts(of which he is not denying). He reports bing under the influence of alcohol and makes these kind of statements " often when I am inebriated. I just say dumb shit I dont mean. I have never tried or wanted to kill myself." He endorses drinking a 36 beers a day and last drank prior to coming in. Denies any prior history of DT's or withdrawal seizures. No overt depressive symptoms, mania or psychosis witnessed at present. Denies any SI, HI or AVH at present. No prior history of self harm or SA and no prior inpatient admission for mental health issues.   06/16/2023:  During the course of the hospitalization patient progressed well and completed his detox taper. No overt withdrawal symptoms. He is med focused and perseverates about discharge and follow up care. He has written out a plan for his alcohol use and identified his support system that includes family and friends, VA sponsor, [place]. Thought process is linear  and organized. Denied any SI, HI or AVH on multiple occasions through his stay. At this time patient symptoms have been managed well with medications consistent with standard of care. Safety planning is coordinated with treatment team.   Detailed risk assessment is complete based on clinical exam and individual risk factors and acute suicide risk is low and acute violence risk is low. At this time, protective factors outweigh risk factors. Safety plan is created jointly which involves [facility] services. Regarding psychotropic medication treatment (Librium), the above psychotropic medications were prescribed and adjustment were made as described below (Medication changes during this hospitalization). He was also given a prescriptions for Thiamine, Nictoine replacement, B12, and folic acid. Physician(s) and team provided education to the client on the risk and benefits of treatment options, instructions for follow-up, and importance of adherence to chosen treatment. Medication side effects, possible drug interactions between the prescription meds and their interaction with recreational substances was discussed with the patient. No intolerant adverse side effects, were reported or noted, on day of discharge.   On day of discharge, following sustained improvement in the affect of this patient, continued report of euthymic mood, repeated denial of suicidal, homicidal, and other violent ideation, adequate interaction with peers, active participation in groups while on the unit, and denial of adverse reactions from medications, the treatment team decided [name] was stable for discharge home with scheduled mental health treatment as noted below.    Past Psychiatric History:  52 year old man who comes to the hospital frequently for detox and sometimes leaves AGAINST MEDICAL ADVICE again admitted for medically supervised.moderate-severe risk  of  alcohol withdrawal symptoms that could not be safely managed as an outpatient.  Risk to Self:  Yes, related to alcoholism Risk to Others:  Denies Prior Inpatient Therapy:  Denies Prior Outpatient [facility]:  [facility]  Past Medical History:  Past Medical History:  Diagnosis Date   Active smoker    Alcohol abuse    Anxiety    Bipolar 2 disorder (HCC)    Depression    PTSD (post-traumatic stress disorder)     Past Surgical History:  Procedure Laterality Date   LYMPH NODE DISSECTION     Family History:  Family History  Problem Relation Age of Onset   Alcohol abuse Father    Family Psychiatric  History: Denies  Social History:  Social History   Substance and Sexual Activity  Alcohol Use Yes   Comment: Patient states he is a binge drinker.     Social History   Substance and Sexual Activity  Drug Use Yes   Types: Marijuana    Social History   Socioeconomic History   Marital status: Legally Separated    Spouse name: Not on file   Number of children: Not on file   Years of education: Not on file   Highest education level: Not on file  Occupational History   Not on file  Tobacco Use   Smoking status: Every Day    Current packs/day: 2.00    Average packs/day: 2.0 packs/day for 20.0 years (40.0 ttl pk-yrs)    Types: Cigarettes   Smokeless tobacco: Never  Vaping Use   Vaping status: Never Used  Substance and Sexual Activity   Alcohol use: Yes    Comment: Patient states he is a binge drinker.   Drug use: Yes    Types: Marijuana   Sexual activity: Yes  Other Topics Concern   Not on file  Social History Narrative   Has an ex-wife and 54 yo son whom he cannot see now, as his ex-wife divorced him. Was in the [employer] and went to [place], [place], and may have some PTSD symptoms from this. Was also a Management consultant. Heavy alcohol abuse during binges, but has had periods of sobriety for months on end. Has been drinking up to two cases of beer a day   Social  Determinants of Health   Financial Resource Strain: Not on file  Food Insecurity: Not on file  Transportation Needs: Not on file  Physical Activity: Not on file  Stress: Not on file  Social Connections: Unknown (01/13/2023)   Received [facility]   Social Network    Social Network: Not on file   Additional Social History:    Allergies:   Allergies  Allergen Reactions   Effexor [Venlafaxine] Hives   Nicoderm [Nicotine] Rash   Paxil [Paroxetine Hcl] Hives and Rash    Labs:  Results for orders placed or performed during the hospital encounter of 06/09/23 (from the past 48 hour(s))  Creatinine, serum     Status: None   Collection Time: 06/16/23  3:09 AM  Result Value Ref Range   Creatinine, Ser 0.80 0.61 - 1.24 mg/dL   GFR, Estimated >16 >10 mL/min    Comment: (NOTE) Calculated using the CKD-EPI Creatinine Equation (2021) Performed at [facility], [address], [place], [place] [address]     Current Facility-Administered Medications  Medication Dose Route Frequency Provider Last Rate Last Admin   chlordiazePOXIDE (LIBRIUM) capsule 25 mg  25 mg Oral  Renda Rolls, MD   25 mg at 06/16/23 0865   Followed by   Melene Muller ON 06/17/2023] chlordiazePOXIDE (LIBRIUM) capsule 25 mg  25 mg Oral Daily Azucena Fallen, MD       Chlorhexidine Gluconate Cloth 2 % PADS 6 each  6 each Topical Daily Opyd, Lavone Neri, MD   6 each at 06/16/23 0938   enoxaparin (LOVENOX) injection 40 mg  40 mg Subcutaneous Q24H Opyd, Lavone Neri, MD   40 mg at 06/12/23 2137   folic acid (FOLVITE) tablet 1 mg  1 mg Oral Daily Opyd, Lavone Neri, MD   1 mg at 06/16/23 0936   haloperidol lactate (HALDOL) injection 2 mg  2 mg Intravenous Q6H PRN Marguerita Merles Latif, DO   2 mg at 06/14/23 7846   LORazepam (ATIVAN) 2 MG/ML concentrated solution 4 mg  4 mg Oral Once Azucena Fallen, MD       multivitamin with minerals tablet 1 tablet  1 tablet Oral Daily Opyd,  Lavone Neri, MD   1 tablet at 06/16/23 0936   nicotine (NICODERM CQ - dosed in mg/24 hours) patch 14 mg  14 mg Transdermal Daily Marguerita Merles [place], DO   14 mg at 06/16/23 9629   nicotine polacrilex (NICORETTE) gum 2 mg  2 mg Oral PRN Marguerita Merles Latif, DO   2 mg at 06/16/23 0225   Oral care mouth rinse  15 mL Mouth Rinse PRN Opyd, Lavone Neri, MD       polyvinyl alcohol (LIQUIFILM TEARS) 1.4 % ophthalmic solution 1 drop  1 drop Both Eyes PRN Luiz Iron, NP   1 drop at 06/16/23 1043   prochlorperazine (COMPAZINE) injection 5 mg  5 mg Intravenous Q4H PRN Opyd, Lavone Neri, MD       senna-docusate (Senokot-S) tablet 1 tablet  1 tablet Oral QHS PRN Opyd, Lavone Neri, MD       sodium chloride flush (NS) 0.9 % injection 3 mL  3 mL Intravenous Q12H Opyd, Lavone Neri, MD   3 mL at 06/16/23 5284   thiamine (VITAMIN B1) tablet 100 mg  100 mg Oral Daily Marguerita Merles [place], DO   100 mg at 06/16/23 1324   traZODone (DESYREL) tablet 100 mg  100 mg Oral QHS Azucena Fallen, MD   100 mg at 06/15/23 2105    Musculoskeletal: Strength & Muscle Tone: within normal limits Gait & Station: normal Patient leans: N/A  Psychiatric Specialty Exam:  Presentation  General Appearance:  Appropriate for Environment; Casual  Eye Contact: Good  Speech: Clear and Coherent; Normal Rate  Speech Volume: Normal  Handedness: Right   Mood and Affect  Mood: Euthymic  Affect: Congruent; Appropriate   Thought Process  Thought Processes: Coherent; Goal Directed  Descriptions of Associations:Intact  Orientation:Full (Time, Place and Person)  Thought Content:WDL  History of Schizophrenia/Schizoaffective disorder:No data recorded Duration of Psychotic Symptoms:No data recorded Hallucinations:Hallucinations: None   Ideas of Reference:None  Suicidal Thoughts:suicidal  Homicidal Thoughts:denies   Sensorium  Memory: Immediate Good; Recent Good; Remote  Good  Judgment: Fair  Insight: Fair   Art therapist  Concentration: Fair  Attention Span: Good  Recall: Good  Fund of Knowledge: Good  Language: Good   Psychomotor Activity  Psychomotor Activity:Psychomotor Activity: Normal    Assets  Assets: Communication Skills; Desire for Improvement; Financial Resources/Insurance; Housing; Resilience; Social Support   Sleep  Sleep:Sleep: Fair    Physical Exam: Physical Exam Vitals and nursing note reviewed.  Constitutional:  General: He is awake.     Appearance: Normal appearance. He is normal weight.  Pulmonary:     Effort: Pulmonary effort is normal. No respiratory distress.  Neurological:     General: No focal deficit present.     Mental Status: He is alert and oriented to person, place, and time. Mental status is at baseline.     Cranial Nerves: Cranial nerves 2-12 are intact.     Sensory: Sensation is intact.     Motor: Motor function is intact.     Gait: Gait is intact.     Deep Tendon Reflexes: Reflexes are normal and symmetric.  Psychiatric:        Attention and Perception: Attention normal.        Mood and Affect: Mood normal.        Speech: Speech normal.        Behavior: Behavior normal. Behavior is cooperative.        Thought Content: Thought content normal.        Judgment: Judgment normal.    Review of Systems  Psychiatric/Behavioral:  Positive for substance abuse (ETOH). Negative for depression, hallucinations and suicidal ideas. The patient is not nervous/anxious.   All other systems reviewed and are negative.  Blood pressure 106/68, pulse 85, temperature 97.9 F (36.6 C), resp. rate 17, height 5\' 10"  (1.778 m), weight 68.7 kg, SpO2 98%. Body mass index is 21.73 kg/m.  Treatment Plan Summary: Daily contact with patient to assess and evaluate symptoms and progress in treatment, Medication management, and Plan   Finish librium detox protocol (3 doses remaining).   Alcohol detox:   -Continue CIWA protocol. Ativan requirements (7mg  yesterday) trending down with CIWA score ( range 1-9)  -Labs obtained include B12(536>204), TSH (1.11), BAL (363), UDS (THC),   -Alcohol Delirium, resolved:  --Continue thiamin (B1 134.4).  DC Haldol 2mg  q6hr IV for agitation/delirium -EKG 06/10/2023: QTC 414 within normal. -DC safety sitter -IVC rescinded  While future psychiatric events cannot be accurately predicted, the patient does not currently require acute inpatient psychiatric care and does not currently meet [facility] involuntary commitment criteria.  Patient will likely benefit from inpatient residential rehab at the [place].    Psychiatry will sign off.   Disposition: No evidence of imminent risk to self or others at present.   Patient does not meet criteria for psychiatric inpatient admission. Supportive therapy provided about ongoing stressors. Refer to IOP. Discussed crisis plan, support from social network, calling 911, coming to the Emergency Department, and calling Suicide Hotline.  Maryagnes Amos, FNP 06/16/2023 1:00 PM

## 2023-06-18 ENCOUNTER — Encounter (HOSPITAL_BASED_OUTPATIENT_CLINIC_OR_DEPARTMENT_OTHER): Payer: Self-pay

## 2023-06-18 ENCOUNTER — Other Ambulatory Visit: Payer: Self-pay

## 2023-06-18 ENCOUNTER — Emergency Department (HOSPITAL_BASED_OUTPATIENT_CLINIC_OR_DEPARTMENT_OTHER): Payer: Non-veteran care | Admitting: Radiology

## 2023-06-18 ENCOUNTER — Emergency Department (HOSPITAL_BASED_OUTPATIENT_CLINIC_OR_DEPARTMENT_OTHER)
Admission: EM | Admit: 2023-06-18 | Discharge: 2023-06-18 | Disposition: A | Discharge status: Home / Self Care | Payer: Non-veteran care | Attending: Emergency Medicine | Admitting: Emergency Medicine

## 2023-06-18 ENCOUNTER — Emergency Department (HOSPITAL_BASED_OUTPATIENT_CLINIC_OR_DEPARTMENT_OTHER): Payer: Non-veteran care

## 2023-06-18 DIAGNOSIS — M546 Pain in thoracic spine: Secondary | ICD-10-CM | POA: Insufficient documentation

## 2023-06-18 DIAGNOSIS — Y92410 Unspecified street and highway as the place of occurrence of the external cause: Secondary | ICD-10-CM | POA: Insufficient documentation

## 2023-06-18 DIAGNOSIS — R0781 Pleurodynia: Secondary | ICD-10-CM | POA: Diagnosis not present

## 2023-06-18 DIAGNOSIS — S301XXA Contusion of abdominal wall, initial encounter: Secondary | ICD-10-CM | POA: Diagnosis not present

## 2023-06-18 DIAGNOSIS — M7918 Myalgia, other site: Secondary | ICD-10-CM

## 2023-06-18 DIAGNOSIS — M545 Low back pain, unspecified: Secondary | ICD-10-CM | POA: Diagnosis not present

## 2023-06-18 DIAGNOSIS — M542 Cervicalgia: Secondary | ICD-10-CM | POA: Diagnosis not present

## 2023-06-18 DIAGNOSIS — S060X1A Concussion with loss of consciousness of 30 minutes or less, initial encounter: Secondary | ICD-10-CM | POA: Diagnosis not present

## 2023-06-18 DIAGNOSIS — R519 Headache, unspecified: Secondary | ICD-10-CM | POA: Diagnosis present
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
## 2023-06-24 ENCOUNTER — Ambulatory Visit (INDEPENDENT_AMBULATORY_CARE_PROVIDER_SITE_OTHER): Payer: No Typology Code available for payment source

## 2023-06-24 ENCOUNTER — Ambulatory Visit (INDEPENDENT_AMBULATORY_CARE_PROVIDER_SITE_OTHER): Payer: No Typology Code available for payment source | Admitting: Sports Medicine

## 2023-06-24 VITALS — BP 122/82 | HR 81 | Ht 70.0 in | Wt 154.0 lb

## 2023-06-24 DIAGNOSIS — S060X1A Concussion with loss of consciousness of 30 minutes or less, initial encounter: Secondary | ICD-10-CM

## 2023-06-24 DIAGNOSIS — M542 Cervicalgia: Secondary | ICD-10-CM

## 2023-06-24 NOTE — Patient Instructions (Addendum)
Recommendations:  -           Relative mental and physical rest for 48 hours after concussive event -Recommend light aerobic activity while keeping symptoms less than 3/10 -Stop mental or physical activities that cause symptoms to worsen greater than 3/10, and wait 24 hours before attempting them again -Eliminate screen time as much as possible for first 48 hours after concussive event, then continue limited screen time (recommend less than 2 hours per day) Xrays on the way out Recommend no driving until re-evaluated Recommend no substances including alcohol until re-evaluated 2 week follow up
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
## 2023-07-07 ENCOUNTER — Encounter: Payer: No Typology Code available for payment source | Admitting: Sports Medicine
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
## 2023-07-09 ENCOUNTER — Ambulatory Visit (INDEPENDENT_AMBULATORY_CARE_PROVIDER_SITE_OTHER): Payer: Self-pay | Admitting: Sports Medicine

## 2023-07-09 VITALS — BP 132/84 | HR 77 | Ht 70.0 in | Wt 150.0 lb

## 2023-07-09 DIAGNOSIS — M542 Cervicalgia: Secondary | ICD-10-CM

## 2023-07-09 DIAGNOSIS — S060X1D Concussion with loss of consciousness of 30 minutes or less, subsequent encounter: Secondary | ICD-10-CM

## 2023-07-09 NOTE — Patient Instructions (Addendum)
Melatonin 5 mg nightly  Neck HEP  -Relative mental and physical rest for 48 hours after concussive event -Recommend light aerobic activity while keeping symptoms less than 3/10 -Stop mental or physical activities that cause symptoms to worsen greater than 3/10, and wait 24 hours before attempting them again -Eliminate screen time as much as possible for first 48 hours after concussive event, then continue limited screen time (recommend less than 2 hours per day) Tylenol and ibuprofen as needed for pain relief 4 week follow up
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
## 2023-08-06 ENCOUNTER — Encounter: Payer: No Typology Code available for payment source | Admitting: Sports Medicine

## 2023-10-22 ENCOUNTER — Encounter (HOSPITAL_COMMUNITY): Payer: Self-pay

## 2023-10-22 ENCOUNTER — Inpatient Hospital Stay (HOSPITAL_COMMUNITY)
Admission: EM | Admit: 2023-10-22 | Discharge: 2023-10-26 | DRG: 897 | Disposition: A | Discharge status: Other Acute Inpatient Hospital | Payer: No Typology Code available for payment source | Attending: Internal Medicine | Admitting: Internal Medicine

## 2023-10-22 ENCOUNTER — Other Ambulatory Visit: Payer: Self-pay

## 2023-10-22 DIAGNOSIS — F10939 Alcohol use, unspecified with withdrawal, unspecified: Secondary | ICD-10-CM | POA: Diagnosis present

## 2023-10-22 DIAGNOSIS — F419 Anxiety disorder, unspecified: Secondary | ICD-10-CM | POA: Diagnosis present

## 2023-10-22 DIAGNOSIS — R000 Tachycardia, unspecified: Secondary | ICD-10-CM | POA: Diagnosis present

## 2023-10-22 DIAGNOSIS — I517 Cardiomegaly: Secondary | ICD-10-CM | POA: Diagnosis present

## 2023-10-22 DIAGNOSIS — F1014 Alcohol abuse with alcohol-induced mood disorder: Principal | ICD-10-CM | POA: Diagnosis present

## 2023-10-22 DIAGNOSIS — E872 Acidosis, unspecified: Secondary | ICD-10-CM | POA: Insufficient documentation

## 2023-10-22 DIAGNOSIS — Z811 Family history of alcohol abuse and dependence: Secondary | ICD-10-CM

## 2023-10-22 DIAGNOSIS — Z87891 Personal history of nicotine dependence: Secondary | ICD-10-CM

## 2023-10-22 DIAGNOSIS — Z79899 Other long term (current) drug therapy: Secondary | ICD-10-CM

## 2023-10-22 DIAGNOSIS — Z888 Allergy status to other drugs, medicaments and biological substances status: Secondary | ICD-10-CM

## 2023-10-22 DIAGNOSIS — F4310 Post-traumatic stress disorder, unspecified: Secondary | ICD-10-CM | POA: Diagnosis present

## 2023-10-22 DIAGNOSIS — Z9152 Personal history of nonsuicidal self-harm: Secondary | ICD-10-CM

## 2023-10-22 DIAGNOSIS — R45851 Suicidal ideations: Principal | ICD-10-CM | POA: Diagnosis present

## 2023-10-22 DIAGNOSIS — F10139 Alcohol abuse with withdrawal, unspecified: Secondary | ICD-10-CM | POA: Diagnosis present

## 2023-10-22 DIAGNOSIS — Y908 Blood alcohol level of 240 mg/100 ml or more: Secondary | ICD-10-CM | POA: Diagnosis present

## 2023-10-22 DIAGNOSIS — F10129 Alcohol abuse with intoxication, unspecified: Secondary | ICD-10-CM | POA: Diagnosis present

## 2023-10-22 DIAGNOSIS — F3181 Bipolar II disorder: Secondary | ICD-10-CM | POA: Diagnosis present

## 2023-10-22 DIAGNOSIS — G40509 Epileptic seizures related to external causes, not intractable, without status epilepticus: Secondary | ICD-10-CM | POA: Diagnosis present

## 2023-10-22 DIAGNOSIS — E785 Hyperlipidemia, unspecified: Secondary | ICD-10-CM | POA: Diagnosis present

## 2023-10-22 DIAGNOSIS — Z9151 Personal history of suicidal behavior: Secondary | ICD-10-CM

## 2023-10-22 LAB — SALICYLATE LEVEL: Salicylate Lvl: <7 mg/dL — ABNORMAL LOW (ref 7.0–30.0)

## 2023-10-22 LAB — CBC WITH DIFFERENTIAL/PLATELET
Abs Immature Granulocytes: 0.02 10*3/uL (ref 0.00–0.07)
Basophils Absolute: 0 10*3/uL (ref 0.0–0.1)
Basophils Relative: 1 %
Eosinophils Absolute: 0.1 10*3/uL (ref 0.0–0.5)
Eosinophils Relative: 1 %
HCT: 51.8 % (ref 39.0–52.0)
Hemoglobin: 17 g/dL (ref 13.0–17.0)
Immature Granulocytes: 0 %
Lymphocytes Relative: 39 %
Lymphs Abs: 3.1 10*3/uL (ref 0.7–4.0)
MCH: 26.2 pg (ref 26.0–34.0)
MCHC: 32.8 g/dL (ref 30.0–36.0)
MCV: 79.7 fL — ABNORMAL LOW (ref 80.0–100.0)
Monocytes Absolute: 0.4 10*3/uL (ref 0.1–1.0)
Monocytes Relative: 5 %
Neutro Abs: 4.2 10*3/uL (ref 1.7–7.7)
Neutrophils Relative %: 54 %
Platelets: 276 10*3/uL (ref 150–400)
RBC: 6.5 MIL/uL — ABNORMAL HIGH (ref 4.22–5.81)
RDW: 15 % (ref 11.5–15.5)
WBC: 7.8 10*3/uL (ref 4.0–10.5)
nRBC: 0 % (ref 0.0–0.2)

## 2023-10-22 LAB — COMPREHENSIVE METABOLIC PANEL WITH GFR
ALT: 26 U/L (ref 0–44)
AST: 35 U/L (ref 15–41)
Albumin: 4.7 g/dL (ref 3.5–5.0)
Alkaline Phosphatase: 56 U/L (ref 38–126)
Anion gap: 15 (ref 5–15)
BUN: 8 mg/dL (ref 6–20)
CO2: 19 mmol/L — ABNORMAL LOW (ref 22–32)
Calcium: 9.1 mg/dL (ref 8.9–10.3)
Chloride: 108 mmol/L (ref 98–111)
Creatinine, Ser: 0.64 mg/dL (ref 0.61–1.24)
GFR, Estimated: >60 mL/min
Glucose, Bld: 89 mg/dL (ref 70–99)
Potassium: 3.8 mmol/L (ref 3.5–5.1)
Sodium: 142 mmol/L (ref 135–145)
Total Bilirubin: 0.7 mg/dL (ref 0.0–1.2)
Total Protein: 8.2 g/dL — ABNORMAL HIGH (ref 6.5–8.1)

## 2023-10-22 LAB — RAPID URINE DRUG SCREEN, HOSP PERFORMED
Amphetamines: NOT DETECTED
Barbiturates: NOT DETECTED
Benzodiazepines: NOT DETECTED
Cocaine: NOT DETECTED
Opiates: NOT DETECTED
Tetrahydrocannabinol: POSITIVE — AB

## 2023-10-22 LAB — ETHANOL: Alcohol, Ethyl (B): 275 mg/dL — ABNORMAL HIGH (ref <10)

## 2023-10-22 LAB — ACETAMINOPHEN LEVEL: Acetaminophen (Tylenol), Serum: <10 ug/mL — ABNORMAL LOW (ref 10–30)

## 2023-10-22 MED ORDER — BISACODYL 5 MG PO TBEC: 5.0000 mg | DELAYED_RELEASE_TABLET | Freq: Every day | ORAL | Status: DC | PRN

## 2023-10-22 MED ORDER — CHLORDIAZEPOXIDE HCL 5 MG PO CAPS: 25.0000 mg | ORAL_CAPSULE | ORAL | Status: DC

## 2023-10-22 MED ORDER — LABETALOL HCL 5 MG/ML IV SOLN: 10.0000 mg | Freq: Four times a day (QID) | INTRAVENOUS | Status: DC | PRN

## 2023-10-22 MED ORDER — CHLORDIAZEPOXIDE HCL 5 MG PO CAPS
25.0000 mg | ORAL_CAPSULE | Freq: Three times a day (TID) | ORAL | Status: DC
  Administered 2023-10-23 – 2023-10-24 (×2): 25 mg via ORAL
  Filled 2023-10-22 (×2): qty 5

## 2023-10-22 MED ORDER — SODIUM CHLORIDE 0.9 % IV SOLN: 250.0000 mL | INTRAVENOUS | Status: AC | PRN

## 2023-10-22 MED ORDER — LORAZEPAM 1 MG PO TABS
0.0000 mg | ORAL_TABLET | Freq: Two times a day (BID) | ORAL | Status: DC
  Administered 2023-10-25: 2 mg via ORAL
  Administered 2023-10-25: 1 mg via ORAL
  Filled 2023-10-22: qty 1
  Filled 2023-10-22 (×2): qty 2

## 2023-10-22 MED ORDER — CHLORDIAZEPOXIDE HCL 5 MG PO CAPS: 25.0000 mg | ORAL_CAPSULE | Freq: Every day | ORAL | Status: DC

## 2023-10-22 MED ORDER — ONDANSETRON HCL 4 MG PO TABS: 4.0000 mg | ORAL_TABLET | Freq: Four times a day (QID) | ORAL | Status: DC | PRN

## 2023-10-22 MED ORDER — LACTATED RINGERS IV BOLUS
500.0000 mL | Freq: Once | INTRAVENOUS | Status: AC
  Administered 2023-10-22: 500 mL via INTRAVENOUS

## 2023-10-22 MED ORDER — SODIUM CHLORIDE 0.9 % IV BOLUS
1000.0000 mL | Freq: Once | INTRAVENOUS | Status: AC
  Administered 2023-10-22: 1000 mL via INTRAVENOUS

## 2023-10-22 MED ORDER — SODIUM CHLORIDE 0.9 % IV SOLN: INTRAVENOUS | Status: DC

## 2023-10-22 MED ORDER — THIAMINE HCL 100 MG/ML IJ SOLN
100.0000 mg | Freq: Every day | INTRAMUSCULAR | Status: DC
  Filled 2023-10-22 (×2): qty 2

## 2023-10-22 MED ORDER — ACETAMINOPHEN 650 MG RE SUPP: 650.0000 mg | Freq: Four times a day (QID) | RECTAL | Status: DC | PRN

## 2023-10-22 MED ORDER — LORAZEPAM 2 MG/ML IJ SOLN: 0.0000 mg | Freq: Two times a day (BID) | INTRAMUSCULAR | Status: DC

## 2023-10-22 MED ORDER — LORAZEPAM 1 MG PO TABS
0.0000 mg | ORAL_TABLET | Freq: Four times a day (QID) | ORAL | Status: AC
  Administered 2023-10-22: 2 mg via ORAL
  Filled 2023-10-22: qty 2

## 2023-10-22 MED ORDER — THIAMINE MONONITRATE 100 MG PO TABS
100.0000 mg | ORAL_TABLET | Freq: Every day | ORAL | Status: DC
  Administered 2023-10-22 – 2023-10-26 (×5): 100 mg via ORAL
  Filled 2023-10-22 (×4): qty 1

## 2023-10-22 MED ORDER — ONDANSETRON HCL 4 MG/2ML IJ SOLN
4.0000 mg | Freq: Four times a day (QID) | INTRAMUSCULAR | Status: DC | PRN
Start: 2023-10-22 — End: 2023-10-26

## 2023-10-22 MED ORDER — ENOXAPARIN SODIUM 40 MG/0.4ML IJ SOSY
40.0000 mg | PREFILLED_SYRINGE | INTRAMUSCULAR | Status: DC
  Filled 2023-10-22 (×3): qty 0.4

## 2023-10-22 MED ORDER — SODIUM CHLORIDE 0.9% FLUSH: 3.0000 mL | INTRAVENOUS | Status: DC | PRN

## 2023-10-22 MED ORDER — CHLORDIAZEPOXIDE HCL 5 MG PO CAPS
25.0000 mg | ORAL_CAPSULE | Freq: Four times a day (QID) | ORAL | Status: AC
  Administered 2023-10-22 – 2023-10-23 (×4): 25 mg via ORAL
  Filled 2023-10-22: qty 1
  Filled 2023-10-22: qty 5
  Filled 2023-10-22 (×2): qty 1

## 2023-10-22 MED ORDER — LORAZEPAM 2 MG/ML IJ SOLN
2.0000 mg | Freq: Once | INTRAMUSCULAR | Status: AC
  Administered 2023-10-22: 2 mg via INTRAMUSCULAR
  Filled 2023-10-22: qty 1

## 2023-10-22 MED ORDER — HALOPERIDOL LACTATE 5 MG/ML IJ SOLN
5.0000 mg | Freq: Once | INTRAMUSCULAR | Status: AC
  Administered 2023-10-22: 5 mg via INTRAMUSCULAR
  Filled 2023-10-22: qty 1

## 2023-10-22 MED ORDER — LORAZEPAM 2 MG/ML IJ SOLN
0.0000 mg | Freq: Four times a day (QID) | INTRAMUSCULAR | Status: AC
  Administered 2023-10-22 – 2023-10-23 (×3): 2 mg via INTRAVENOUS
  Filled 2023-10-22 (×3): qty 1

## 2023-10-22 MED ORDER — SODIUM CHLORIDE 0.9% FLUSH
3.0000 mL | Freq: Two times a day (BID) | INTRAVENOUS | Status: DC
  Administered 2023-10-23 – 2023-10-26 (×6): 3 mL via INTRAVENOUS

## 2023-10-22 MED ORDER — ACETAMINOPHEN 325 MG PO TABS: 650.0000 mg | ORAL_TABLET | Freq: Four times a day (QID) | ORAL | Status: DC | PRN
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
## 2023-10-22 NOTE — ED Provider Notes (Signed)
Called to see patient secondary to alcohol withdrawal as well as tachycardia.  Patient normally drinks 35 beers a day and has not had a drink since her this morning.  Alcohol level noted at 11:00 was 275.  Patient is tachycardic up to 130 at this time.  Was given Ativan.  Has history of admission for cholesterol and will require the same.  Will consult hospitalist team   Lorre Nick, MD 10/22/23 1910

## 2023-10-22 NOTE — ED Triage Notes (Signed)
C/o SI/HI. Girlfriend states 30 beers a day x4 days and last intake was in car before coming in.  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

## 2023-10-22 NOTE — ED Notes (Signed)
Pt ran out of ems doors stating he does not want to wear the uniform. Security called, pt brought back to hall a.

## 2023-10-22 NOTE — ED Notes (Signed)
[name] stickel girlfriend [phone]

## 2023-10-22 NOTE — Progress Notes (Signed)
Pt is currently admitted on the medical floor. Per Alona Bene, PMHNP pt has been recommended for inpatient behavioral health placement and pt has [employer]. This CSW sent clinicals to [facility] on 10/22/2023. The [facility] Forms are still needing to be complete for [facility] review.  This CSW will now remove pt from the TTS/BH shift report because pt has been medically admitted.   Once pt is medically clear follow up can be made for disposition. This CSW will remove pt from the TTS/BH shift report because pt is medically admitted and will be followed by inpatient psych consult team.   Maryjean Ka, MSW, LCSWA 10/23/2023 5:29 PM

## 2023-10-22 NOTE — Progress Notes (Signed)
LCSW Progress Note  [id number]   [name]  10/22/2023  5:41 PM    Inpatient Behavioral Health Placement  Pt meets inpatient criteria per Alona Bene, PMHNP. Pt has [employer]. CSW/ Disposition team will assist and follow.  Destination  Service Provider Address Phone Fax  [facility] (after hours) [address], [place] [address] [phone]    Situation ongoing,  CSW will follow up.    Maryjean Ka, MSW, [facility] 10/22/2023 5:41 PM

## 2023-10-22 NOTE — ED Notes (Signed)
Dinner tray given

## 2023-10-22 NOTE — H&P (Addendum)
History and Physical    [name] UVO:[id number] DOB: 1971-08-21 DOA: 10/22/2023  PCP: [facility]   Patient coming from: Home   Chief Complaint:  Chief Complaint  Patient presents with   Psychiatric Evaluation   ED TRIAGE note:Called to see patient secondary to alcohol withdrawal as well as tachycardia. Patient normally drinks 35 beers a day and has not had a drink since her this morning. Alcohol level noted at 11:00 was 275. Patient is tachycardic up to 130 at this time. Was given Ativan. Has history of admission for cholesterol and will require the same. Will consult hospitalist team   HPI:  [name] is a 53 y.o. 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 Patient is dropped up to the emergency department by his girlfriend and stated that patient has suicidal thoughts. Patient is alert oriented x 4, anxious and restless.  He is irritable and moderately distressed.  Patient has large speech charitable behavior.  Patient currently does not have any psychotic manic and delusional thinking.  Currently is denying any suicidal ideation, self-harm, homicidal ideation, psychosis and paranoia.  However patient is under alcohol intoxication and he is not reliable historian.  When patient first presented to ED he had acute suicidal ideation and thought to shoot himself/kill himself and patient was made IVC by Dr. Marlene Bast while in the ED around 1 PM 10/22/2023.  Has been evaluated by psychiatry service and recommended inpatient psychiatric hospitalization after medical hospitalization.    ED Course:  At presentation to ED patient is persistently tachycardic heart rate up to 126 and borderline hypertensive.  Otherwise hemodynamically stable. EKG showing sinus rhythm heart rate 92, voltage criteria of left-ventricular hypertrophy. CMP showing low bicarb 19  otherwise unremarkable. Elevated blood alcohol level 257.  Acetaminophen Tylenol none level within normal range.  Pending UDS. CBC unremarkable except low MCV 79.  Per chart review of the psychiatry note stated that patient has been involuntary committed on 10/22/2023 but I do not find any active IVC order on the chart.  Currently patient is not suicidal and in the ED per Dr. Ethelene Browns the IVC has not implemented yet not that the paperwork has been filled.  ED physician Dr. Oneita Kras recommended me to reach out to on-call psychiatry for clarification for IVC requirement or not.  Hospitalist has been contacted for further evaluation management of persistent tachycardia in the setting of alcohol withdrawal.  Significant event in the ED documented by Dr. Marlene Bast is following:    Clinical Course as of 10/22/23 1635  Thu Oct 22, 2023  0939 I evaluated patient.  Immediately on evaluation as concern patient was suicidal and an acute threat to himself.  Placed orders for one-to-one monitoring, and filled out IVC paperwork.  I also spoke with charge immediately after I spoke with the patient to arrange of sitter due to need for IVC.  Unfortunately patient then ran outside, security notified and are working on bring patient back to the ED. [JD]  1000 Patient was threatening staff and trying to her anxiety a fight with security and tried to get security to shoot him after he was brought back in the ED.  He required treatment for acute agitation as he is an acute threat to himself and others. [JD]  1034 Patient is resting comfortably on my reassessment.  He is easily arousable, no tremulousness, agitation has improved. [JD]  Clinical Course User Index [JD] Laurence Spates, MD    Significant labs in the ED: Lab Orders         Comprehensive metabolic panel         Ethanol         Urine rapid drug screen (hosp performed)         CBC with Diff         Acetaminophen level         Salicylate level          Urinalysis, Routine w reflex microscopic -Urine, Clean Catch         Comprehensive metabolic panel         CBC       Review of Systems:  Review of Systems  Constitutional:  Negative for malaise/fatigue.  Eyes:  Negative for blurred vision.  Respiratory:  Negative for cough, sputum production and shortness of breath.   Cardiovascular:  Positive for palpitations. Negative for chest pain.  Gastrointestinal:  Negative for abdominal pain, heartburn, nausea and vomiting.  Musculoskeletal:  Negative for falls and joint pain.  Neurological:  Positive for tremors and headaches. Negative for dizziness, seizures and loss of consciousness.  Psychiatric/Behavioral:  Positive for memory loss. Negative for hallucinations, substance abuse and suicidal ideas. The patient is nervous/anxious and has insomnia.   All other systems reviewed and are negative.   Past Medical History:  Diagnosis Date   Active smoker    Alcohol abuse    Anxiety    Bipolar 2 disorder (HCC)    Depression    PTSD (post-traumatic stress disorder)     Past Surgical History:  Procedure Laterality Date   HERNIA REPAIR     LYMPH NODE DISSECTION       reports that he has been smoking cigarettes. He has a 40 pack-year smoking history. He has never used smokeless tobacco. He reports that he does not currently use alcohol. He reports current drug use. Drug: Marijuana.  Allergies  Allergen Reactions   Effexor [Venlafaxine] Hives   Paroxetine Hives   Nicoderm [Nicotine] Rash   Paxil [Paroxetine Hcl] Hives and Rash    Family History  Problem Relation Age of Onset   Alcohol abuse Father     Prior to Admission medications   Medication Sig Start Date End Date Taking? Authorizing Provider  acetaminophen (TYLENOL) 500 MG tablet Take 500 mg by mouth daily as needed for mild pain (pain score 1-3) or moderate pain (pain score 4-6).   Yes [provider]  folic acid (FOLVITE) 1 MG tablet Take 1 tablet (1 mg total)  by mouth daily. Patient not taking: Reported on 10/22/2023 06/17/23   Azucena Fallen, MD  gabapentin (NEURONTIN) 300 MG capsule Take 300 mg by mouth 3 (three) times daily. Patient not taking: Reported on 10/22/2023    [provider]  Multiple Vitamin (MULTIVITAMIN WITH MINERALS) TABS tablet Take 1 tablet by mouth daily. Patient not taking: Reported on 10/22/2023 06/17/23   Azucena Fallen, MD  nicotine (NICODERM CQ - DOSED IN MG/24 HOURS) 14 mg/24hr patch Place 1 patch (14 mg total) onto the skin daily. Patient not taking: Reported on 10/22/2023 06/17/23   Azucena Fallen, MD  nicotine polacrilex (NICORETTE) 4 MG gum Take 4 mg by mouth See admin instructions. Chew 4 mg every 2 hours as needed for smoking sensation Patient not taking: Reported on 10/22/2023    [provider]  rosuvastatin (CRESTOR) 5 MG tablet Take 2.5 mg  by mouth daily. Patient not taking: Reported on 10/22/2023 07/07/23   [provider]  sildenafil (VIAGRA) 100 MG tablet Take 50 mg by mouth See admin instructions. Take 50 mg 30 minutes to 4 hours prior to sexual activity as needed for ED. Patient not taking: Reported on 10/22/2023    [provider]  thiamine (VITAMIN B-1) 100 MG tablet Take 1 tablet (100 mg total) by mouth daily. Patient not taking: Reported on 10/22/2023 06/17/23   Azucena Fallen, MD  traZODone (DESYREL) 50 MG tablet Take 50 mg by mouth at bedtime. Patient not taking: Reported on 10/22/2023    [provider]     Physical Exam: Vitals:   10/22/23 1826 10/22/23 1845 10/22/23 1900 10/22/23 1930  BP: (!) 162/99 120/88 103/70 (!) 102/59  Pulse: (!) 130 (!) 126 (!) 105 (!) 110  Resp:  18 (!) 21 (!) 21  Temp:  98 F (36.7 C)    TempSrc:  Oral    SpO2:  94% 94% 95%  Weight:      Height:        Physical Exam Vitals reviewed.  Constitutional:      General: He is not in acute distress.    Appearance: He is not ill-appearing.     Comments:  Intoxicated due to alcohol  HENT:     Mouth/Throat:     Mouth: Mucous membranes are moist.  Eyes:     Conjunctiva/sclera: Conjunctivae normal.  Cardiovascular:     Rate and Rhythm: Regular rhythm. Tachycardia present.     Pulses: Normal pulses.     Heart sounds: Normal heart sounds.  Pulmonary:     Effort: Pulmonary effort is normal.     Breath sounds: Normal breath sounds.  Abdominal:     General: Bowel sounds are normal.  Musculoskeletal:     Cervical back: Normal range of motion and neck supple.     Right lower leg: No edema.     Left lower leg: No edema.  Skin:    Capillary Refill: Capillary refill takes less than 2 seconds.  Neurological:     Comments: Alert and oriented to self and place.  Psychiatric:     Comments: Unable to assess due to alcohol intoxication      Labs on Admission: I have personally reviewed following labs and imaging studies  CBC: Recent Labs  Lab 10/22/23 1007  WBC 7.8  NEUTROABS 4.2  HGB 17.0  HCT 51.8  MCV 79.7*  PLT 276   Basic Metabolic Panel: Recent Labs  Lab 10/22/23 1007  NA 142  K 3.8  CL 108  CO2 19*  GLUCOSE 89  BUN 8  CREATININE 0.64  CALCIUM 9.1   GFR: Estimated Creatinine Clearance: 105.1 mL/min (by C-G formula based on SCr of 0.64 mg/dL). Liver Function Tests: Recent Labs  Lab 10/22/23 1007  AST 35  ALT 26  ALKPHOS 56  BILITOT 0.7  PROT 8.2*  ALBUMIN 4.7   No results for input(s): "LIPASE", "AMYLASE" in the last 168 hours. No results for input(s): "AMMONIA" in the last 168 hours. Coagulation Profile: No results for input(s): "INR", "PROTIME" in the last 168 hours. Cardiac Enzymes: No results for input(s): "CKTOTAL", "CKMB", "CKMBINDEX", "TROPONINI", "TROPONINIHS" in the last 168 hours. BNP (last 3 results) No results for input(s): "BNP" in the last 8760 hours. HbA1C: No results for input(s): "HGBA1C" in the last 72 hours. CBG: No results for input(s): "GLUCAP" in the last 168 hours. Lipid  Profile: No  results for input(s): "CHOL", "HDL", "LDLCALC", "TRIG", "CHOLHDL", "LDLDIRECT" in the last 72 hours. Thyroid Function Tests: No results for input(s): "TSH", "T4TOTAL", "FREET4", "T3FREE", "THYROIDAB" in the last 72 hours. Anemia Panel: No results for input(s): "VITAMINB12", "FOLATE", "FERRITIN", "TIBC", "IRON", "RETICCTPCT" in the last 72 hours. Urine analysis:    Component Value Date/Time   COLORURINE STRAW (A) 03/24/2023 0836   APPEARANCEUR CLEAR 03/24/2023 0836   LABSPEC 1.004 (L) 03/24/2023 0836   PHURINE 5.0 03/24/2023 0836   GLUCOSEU NEGATIVE 03/24/2023 0836   HGBUR NEGATIVE 03/24/2023 0836   BILIRUBINUR NEGATIVE 03/24/2023 0836   KETONESUR NEGATIVE 03/24/2023 0836   PROTEINUR NEGATIVE 03/24/2023 0836   NITRITE NEGATIVE 03/24/2023 0836   LEUKOCYTESUR NEGATIVE 03/24/2023 0836    Radiological Exams on Admission: I have personally reviewed images No results found.   EKG: My personal interpretation of EKG shows: Sinus heart rate 94 and left ventricular hypertrophy pattern.    Assessment/Plan: Principal Problem:   Alcohol abuse with alcohol-induced mood disorder (HCC) Active Problems:   Alcohol withdrawal (HCC)   PTSD (post-traumatic stress disorder)   Metabolic acidosis    Assessment and Plan: Alcohol withdrawal History of chronic alcohol abuse Alcohol induced psychosis Sinus tachycardia-from alcoholdrawl - Patient currently drinking 35 cans of beer on daily basis.  Last drink yesterday 10/21/2023. -Blood alcohol level around 275.  Pending UDS.  Acetaminophen acid salicylate level within normal range.  CBC unremarkable except low MCV and CMP showing low bicarb 19. -Continue alcohol withdrawal CIWA protocol. - Continue Librium taper dose and Ativan as needed. - Continue fall precaution, seizure precaution and aspiration precaution. - Due to persistent tachycardia evening 500 mL of LR bolus. - Continue cardiac monitoring. -Continue  thiamine, folic acid  and multivitamin. - Continue labetalol 10 mg every 6 hour as needed for SBP above 160 or DBP above 110. -Continue cardiac monitoring.  History of PTSD Alcohol induced psychosis > Patient initially presented to ED with suicidal ideation and thought.  He had active suicidal ideation and to kill himself/shoot himself.  Patient was made IVC by ED physician Dr. Marlene Bast. -During my evaluation at the bedside patient stating that he does not have any suicidal thought or ideation anymore however patient is not reliable and good historian.  - I am planning to keep the IVC for tonight and will let psychiatry team to reevaluate again in the morning to decide to remove the IVC versus continue the IVC. -Continue bedside sitter for monitoring. -Continue suicidal precaution. -Continue delirium precaution.  Metabolic acidosis -Low bicarb 19.  Normal anion gap.  Metabolic acidosis in the setting of alcohol use.  History of hyperlipidemia - Not taking Lipitor anymore.   DVT prophylaxis:  Lovenox Code Status:  Full Code Diet: Heart healthy diet with harm prevention tray Family Communication: None present Disposition Plan: Tentative transfer to psychiatry unit once medically clear Consults: Psychiatry and transition care team Admission status:   Inpatient, Telemetry bed  Severity of Illness: The appropriate patient status for this patient is INPATIENT. Inpatient status is judged to be reasonable and necessary in order to provide the required intensity of service to ensure the patient's safety. The patient's presenting symptoms, physical exam findings, and initial radiographic and laboratory data in the context of their chronic comorbidities is felt to place them at high risk for further clinical deterioration. Furthermore, it is not anticipated that the patient will be medically stable for discharge from the hospital within 2 midnights of admission.   * I certify that at  the point of admission it is  my clinical judgment that the patient will require inpatient hospital care spanning beyond 2 midnights from the point of admission due to high intensity of service, high risk for further deterioration and high frequency of surveillance required.Marland Kitchen    Tereasa Coop, MD [facility]  How to contact the [facility] Attending or Consulting provider 7A - 7P or covering provider during after hours 7P -7A, for this patient.  Check the care team in [facility] and look for a) attending/consulting TRH provider listed and b) the [facility] team listed Log into [email or web address] and use 's universal password to access. If you do not have the password, please contact the hospital operator. Locate the [facility] provider you are looking for under [facility] and page to a number that you can be directly reached. If you still have difficulty reaching the provider, please page the [facility] (Director on Call) for the Hospitalists listed on amion for assistance.  10/22/2023, 7:59 PM

## 2023-10-22 NOTE — ED Notes (Signed)
Pt escorted back to hall A by security and gpd, pt making statements towards officers wanting them to fight him and shoot him. Pt cooperative with care, tearful and upset. Pt changed into gown, necklace, phone, wallet, shoes and clothes placed in labeled belonging bag into locker 36.

## 2023-10-22 NOTE — ED Notes (Signed)
Patient sleeping after med injections
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
## 2023-10-23 DIAGNOSIS — Z888 Allergy status to other drugs, medicaments and biological substances status: Secondary | ICD-10-CM | POA: Diagnosis not present

## 2023-10-23 DIAGNOSIS — F4310 Post-traumatic stress disorder, unspecified: Secondary | ICD-10-CM | POA: Diagnosis present

## 2023-10-23 DIAGNOSIS — F10139 Alcohol abuse with withdrawal, unspecified: Secondary | ICD-10-CM | POA: Diagnosis present

## 2023-10-23 DIAGNOSIS — Z811 Family history of alcohol abuse and dependence: Secondary | ICD-10-CM | POA: Diagnosis not present

## 2023-10-23 DIAGNOSIS — F10129 Alcohol abuse with intoxication, unspecified: Secondary | ICD-10-CM | POA: Diagnosis present

## 2023-10-23 DIAGNOSIS — F419 Anxiety disorder, unspecified: Secondary | ICD-10-CM | POA: Diagnosis present

## 2023-10-23 DIAGNOSIS — E872 Acidosis, unspecified: Secondary | ICD-10-CM | POA: Diagnosis present

## 2023-10-23 DIAGNOSIS — F1014 Alcohol abuse with alcohol-induced mood disorder: Secondary | ICD-10-CM

## 2023-10-23 DIAGNOSIS — E785 Hyperlipidemia, unspecified: Secondary | ICD-10-CM | POA: Diagnosis present

## 2023-10-23 DIAGNOSIS — F3181 Bipolar II disorder: Secondary | ICD-10-CM | POA: Diagnosis present

## 2023-10-23 DIAGNOSIS — R000 Tachycardia, unspecified: Secondary | ICD-10-CM | POA: Diagnosis present

## 2023-10-23 DIAGNOSIS — Z9151 Personal history of suicidal behavior: Secondary | ICD-10-CM | POA: Diagnosis not present

## 2023-10-23 DIAGNOSIS — Z87891 Personal history of nicotine dependence: Secondary | ICD-10-CM | POA: Diagnosis not present

## 2023-10-23 DIAGNOSIS — Z9152 Personal history of nonsuicidal self-harm: Secondary | ICD-10-CM | POA: Diagnosis not present

## 2023-10-23 DIAGNOSIS — F10939 Alcohol use, unspecified with withdrawal, unspecified: Secondary | ICD-10-CM | POA: Diagnosis present

## 2023-10-23 DIAGNOSIS — I517 Cardiomegaly: Secondary | ICD-10-CM | POA: Diagnosis present

## 2023-10-23 DIAGNOSIS — Y908 Blood alcohol level of 240 mg/100 ml or more: Secondary | ICD-10-CM | POA: Diagnosis present

## 2023-10-23 DIAGNOSIS — Z79899 Other long term (current) drug therapy: Secondary | ICD-10-CM | POA: Diagnosis not present

## 2023-10-23 DIAGNOSIS — R45851 Suicidal ideations: Secondary | ICD-10-CM | POA: Diagnosis present

## 2023-10-23 DIAGNOSIS — G40509 Epileptic seizures related to external causes, not intractable, without status epilepticus: Secondary | ICD-10-CM | POA: Diagnosis present

## 2023-10-23 LAB — CBC
HCT: 43.6 % (ref 39.0–52.0)
Hemoglobin: 14.5 g/dL (ref 13.0–17.0)
MCH: 26.7 pg (ref 26.0–34.0)
MCHC: 33.3 g/dL (ref 30.0–36.0)
MCV: 80.3 fL (ref 80.0–100.0)
Platelets: 218 10*3/uL (ref 150–400)
RBC: 5.43 MIL/uL (ref 4.22–5.81)
RDW: 14.3 % (ref 11.5–15.5)
WBC: 6.6 10*3/uL (ref 4.0–10.5)
nRBC: 0 % (ref 0.0–0.2)

## 2023-10-23 LAB — COMPREHENSIVE METABOLIC PANEL
ALT: 24 U/L (ref 0–44)
AST: 35 U/L (ref 15–41)
Albumin: 3.7 g/dL (ref 3.5–5.0)
Alkaline Phosphatase: 48 U/L (ref 38–126)
Anion gap: 9 (ref 5–15)
BUN: 16 mg/dL (ref 6–20)
CO2: 23 mmol/L (ref 22–32)
Calcium: 8.9 mg/dL (ref 8.9–10.3)
Chloride: 107 mmol/L (ref 98–111)
Creatinine, Ser: 0.72 mg/dL (ref 0.61–1.24)
GFR, Estimated: >60 mL/min (ref >60)
Glucose, Bld: 79 mg/dL (ref 70–99)
Potassium: 3.7 mmol/L (ref 3.5–5.1)
Sodium: 139 mmol/L (ref 135–145)
Total Bilirubin: 1.6 mg/dL — ABNORMAL HIGH (ref 0.0–1.2)
Total Protein: 6.4 g/dL — ABNORMAL LOW (ref 6.5–8.1)

## 2023-10-23 LAB — VITAMIN B12: Vitamin B-12: 199 pg/mL (ref 180–914)

## 2023-10-23 LAB — ETHANOL: Alcohol, Ethyl (B): 12 mg/dL — ABNORMAL HIGH (ref <10)

## 2023-10-23 LAB — TSH: TSH: 0.783 u[IU]/mL (ref 0.350–4.500)

## 2023-10-23 MED ORDER — LORAZEPAM 2 MG/ML IJ SOLN
2.0000 mg | INTRAMUSCULAR | Status: DC | PRN
  Administered 2023-10-23 – 2023-10-26 (×4): 2 mg via INTRAVENOUS
  Filled 2023-10-23 (×4): qty 1

## 2023-10-23 MED ORDER — NICOTINE 21 MG/24HR TD PT24
21.0000 mg | MEDICATED_PATCH | Freq: Every day | TRANSDERMAL | Status: DC
  Administered 2023-10-23 – 2023-10-26 (×4): 21 mg via TRANSDERMAL
  Filled 2023-10-23 (×4): qty 1

## 2023-10-23 MED ORDER — IPRATROPIUM-ALBUTEROL 0.5-2.5 (3) MG/3ML IN SOLN: 3.0000 mL | RESPIRATORY_TRACT | Status: DC | PRN

## 2023-10-23 MED ORDER — HALOPERIDOL LACTATE 5 MG/ML IJ SOLN
5.0000 mg | Freq: Once | INTRAMUSCULAR | Status: AC
  Administered 2023-10-23: 5 mg via INTRAMUSCULAR
  Filled 2023-10-23: qty 1

## 2023-10-23 MED ORDER — METOPROLOL TARTRATE 5 MG/5ML IV SOLN: 5.0000 mg | INTRAVENOUS | Status: DC | PRN

## 2023-10-23 MED ORDER — DIPHENHYDRAMINE HCL 50 MG/ML IJ SOLN
50.0000 mg | Freq: Once | INTRAMUSCULAR | Status: AC
  Administered 2023-10-23: 50 mg via INTRAMUSCULAR
  Filled 2023-10-23: qty 1

## 2023-10-23 MED ORDER — HYDRALAZINE HCL 20 MG/ML IJ SOLN: 10.0000 mg | INTRAMUSCULAR | Status: DC | PRN

## 2023-10-23 MED ORDER — LORAZEPAM 2 MG/ML IJ SOLN
2.0000 mg | Freq: Once | INTRAMUSCULAR | Status: AC
  Administered 2023-10-23: 2 mg via INTRAVENOUS
  Filled 2023-10-23: qty 1

## 2023-10-23 MED ORDER — SODIUM CHLORIDE 0.9 % IV BOLUS
1000.0000 mL | Freq: Once | INTRAVENOUS | Status: AC
  Administered 2023-10-23: 1000 mL via INTRAVENOUS

## 2023-10-23 MED ORDER — GUAIFENESIN 100 MG/5ML PO LIQD: 5.0000 mL | ORAL | Status: DC | PRN
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
## 2023-10-23 NOTE — Hospital Course (Addendum)
Brief Narrative:  53 year old with history of chronic alcohol use, recurrent admission due to withdrawals, PTSD comes to the ED with complaints of suicidal thoughts.  Patient is quite irritable and distressed.  Patient was IVC.D in the ED and psychiatry was consulted. Upon admission initially started on Librium taper thereafter transition to phenobarbital.  He has done well in terms of alcohol withdrawal.  Seen by psychiatry, currently IVC and pending transfer to inpatient psych.  Transferred to Inptn psych [facility] on 10/26/23  Assessment & Plan:  Principal Problem:   Alcohol abuse with alcohol-induced mood disorder (HCC) Active Problems:   Alcohol withdrawal (HCC)   PTSD (post-traumatic stress disorder)   Metabolic acidosis   Alcohol withdrawal History of chronic alcohol abuse Alcohol induced psychosis Sinus tachycardia-from alcoholdrawl No further showing signs of acute alcohol withdrawal.  On the tail end of his phenobarbital taper.  Gabapentin has been added by psychiatry.  History of PTSD Alcohol induced psychosis Active suicide thoughts Patient is currently involuntarily committed.  Psychiatry team is following.  Awaiting inpatient psych - Suicide and delirium protocol   Metabolic acidosis Resolved   History of hyperlipidemia - Not taking Lipitor anymore.   Pending inptn psych placement Medically stable.   DVT prophylaxis: Lovenox    Code Status: Full Code Family Communication:   Medically stable to transfer him to inpatient psych  Subjective: Doing well, keeps asking that he does not want to be transferred to inpatient psych.  I explained to him that psychiatry team has to clear him before he can be let go otherwise IVC remains in place.   Examination:  Constitutional: Not in acute distress Respiratory: Clear to auscultation bilaterally Cardiovascular: Normal sinus rhythm, no rubs Abdomen: Nontender nondistended good bowel sounds Musculoskeletal: No edema  noted Skin: No rashes seen Neurologic: CN 2-12 grossly intact.  And nonfocal Psychiatric: Very restless/frustrated and agitated and unpredictable mood.

## 2023-10-23 NOTE — Progress Notes (Signed)
PROGRESS NOTE    [name]  [id number]:[id number] DOB: [birth year] DOA: 10/22/2023 PCP: [facility]    Brief Narrative:  53 year old with history of chronic alcohol use, recurrent admission due to withdrawals, PTSD comes to the ED with complaints of suicidal thoughts.  Patient is quite irritable and distressed.  Patient was IVC.D in the ED and psychiatry was consulted.   Assessment & Plan:  Principal Problem:   Alcohol abuse with alcohol-induced mood disorder (HCC) Active Problems:   Alcohol withdrawal (HCC)   PTSD (post-traumatic stress disorder)   Metabolic acidosis   Alcohol withdrawal History of chronic alcohol abuse Alcohol induced psychosis Sinus tachycardia-from alcoholdrawl Patient intoxicated upon admission.  Currently on alcohol withdrawal protocol.  Librium taper, multivitamin, folic acid and thiamine.  History of PTSD Alcohol induced psychosis Active suicide thoughts Patient is currently involuntarily committed.  Seen by psychiatry, will need inpatient psychiatry admission once medically cleared - Suicide and delirium protocol   Metabolic acidosis Resolved   History of hyperlipidemia - Not taking Lipitor anymore.     DVT prophylaxis: Lovenox    Code Status: Full Code Family Communication:   Continue hospital stay to see any further signs of withdrawal.    Subjective: During my side patient did not have any complaints.  He was answering all the basic questions appropriately.  He was slightly agitated but denied any homicidal or suicidal ideation.   Examination:  General exam: Appears calm and comfortable  Respiratory system: Clear to auscultation. Respiratory effort normal. Cardiovascular system: S1 & S2 heard, RRR. No JVD, murmurs, rubs, gallops or clicks. No pedal edema. Gastrointestinal system: Abdomen is nondistended, soft and nontender. No organomegaly or masses felt. Normal bowel sounds heard. Central nervous system: Alert and  oriented. No focal neurological deficits. Extremities: Symmetric 5 x 5 power. Skin: No rashes, lesions or ulcers Psychiatry: Judgement and insight appear normal. Mood & affect appropriate.                Diet Orders (From admission, onward)     Start     Ordered   10/22/23 1955  Diet Heart Room service appropriate? Yes; Fluid consistency: Thin  Diet effective now       Comments: Harm prevention tray.  Patient had previous suicidal thoughts.  Question Answer Comment  Room service appropriate? Yes   Fluid consistency: Thin      10/22/23 1954            Objective: Vitals:   10/23/23 0700 10/23/23 0956 10/23/23 1130 10/23/23 1144  BP:   (!) 118/96 (!) 118/96  Pulse: 87   90  Resp:  18    Temp:  97.9 F (36.6 C)    TempSrc:  Oral    SpO2: 96%     Weight:      Height:       No intake or output data in the 24 hours ending 10/23/23 1207 Filed Weights   10/22/23 0915  Weight: 68 kg    Scheduled Meds:  chlordiazePOXIDE  25 mg Oral QID   Followed by   chlordiazePOXIDE  25 mg Oral TID   Followed by   Melene Muller ON 10/24/2023] chlordiazePOXIDE  25 mg Oral BH-qamhs   Followed by   Melene Muller ON 10/25/2023] chlordiazePOXIDE  25 mg Oral Daily   enoxaparin (LOVENOX) injection  40 mg Subcutaneous Q24H   LORazepam  0-4 mg Intravenous Q6H   Or   LORazepam  0-4 mg Oral Q6H   [START ON 10/24/2023] LORazepam  0-4 mg Intravenous Q12H   Or   [START ON 10/24/2023] LORazepam  0-4 mg Oral Q12H   sodium chloride flush  3 mL Intravenous Q12H   thiamine  100 mg Oral Daily   Or   thiamine  100 mg Intravenous Daily   Continuous Infusions:  sodium chloride      Nutritional status     Body mass index is 21.51 kg/m.  Data Reviewed:   CBC: Recent Labs  Lab 10/22/23 1007 10/23/23 0538  WBC 7.8 6.6  NEUTROABS 4.2  --   HGB 17.0 14.5  HCT 51.8 43.6  MCV 79.7* 80.3  PLT 276 218   Basic Metabolic Panel: Recent Labs  Lab 10/22/23 1007 10/23/23 0538  NA 142 139  K 3.8  3.7  CL 108 107  CO2 19* 23  GLUCOSE 89 79  BUN 8 16  CREATININE 0.64 0.72  CALCIUM 9.1 8.9   GFR: Estimated Creatinine Clearance: 105.1 mL/min (by C-G formula based on SCr of 0.72 mg/dL). Liver Function Tests: Recent Labs  Lab 10/22/23 1007 10/23/23 0538  AST 35 35  ALT 26 24  ALKPHOS 56 48  BILITOT 0.7 1.6*  PROT 8.2* 6.4*  ALBUMIN 4.7 3.7   No results for input(s): "LIPASE", "AMYLASE" in the last 168 hours. No results for input(s): "AMMONIA" in the last 168 hours. Coagulation Profile: No results for input(s): "INR", "PROTIME" in the last 168 hours. Cardiac Enzymes: No results for input(s): "CKTOTAL", "CKMB", "CKMBINDEX", "TROPONINI" in the last 168 hours. BNP (last 3 results) No results for input(s): "PROBNP" in the last 8760 hours. HbA1C: No results for input(s): "HGBA1C" in the last 72 hours. CBG: No results for input(s): "GLUCAP" in the last 168 hours. Lipid Profile: No results for input(s): "CHOL", "HDL", "LDLCALC", "TRIG", "CHOLHDL", "LDLDIRECT" in the last 72 hours. Thyroid Function Tests: No results for input(s): "TSH", "T4TOTAL", "FREET4", "T3FREE", "THYROIDAB" in the last 72 hours. Anemia Panel: No results for input(s): "VITAMINB12", "FOLATE", "FERRITIN", "TIBC", "IRON", "RETICCTPCT" in the last 72 hours. Sepsis Labs: No results for input(s): "PROCALCITON", "LATICACIDVEN" in the last 168 hours.  No results found for this or any previous visit (from the past 240 hours).       Radiology Studies: No results found.         LOS: 0 days   Time spent= 35 mins    Miguel Rota, MD [facility]  If 7PM-7AM, please contact night-coverage  10/23/2023, 12:07 PM

## 2023-10-23 NOTE — Plan of Care (Signed)
  Problem: Education: Goal: Knowledge of General Education information will improve Description: Including pain rating scale, medication(s)/side effects and non-pharmacologic comfort measures Outcome: Progressing   Problem: Clinical Measurements: Goal: Will remain free from infection Outcome: Progressing Goal: Respiratory complications will improve Outcome: Progressing Goal: Cardiovascular complication will be avoided Outcome: Progressing   Problem: Activity: Goal: Risk for activity intolerance will decrease Outcome: Progressing   Problem: Nutrition: Goal: Adequate nutrition will be maintained Outcome: Progressing   Problem: Elimination: Goal: Will not experience complications related to bowel motility Outcome: Progressing Goal: Will not experience complications related to urinary retention Outcome: Progressing   Problem: Safety: Goal: Ability to remain free from injury will improve Outcome: Progressing

## 2023-10-23 NOTE — Progress Notes (Signed)
TOC consulted for substance abuse resources. Resources attached to AVS. No further TOC needs at this time.

## 2023-10-24 DIAGNOSIS — F1014 Alcohol abuse with alcohol-induced mood disorder: Secondary | ICD-10-CM | POA: Diagnosis not present

## 2023-10-24 LAB — CBC
HCT: 44.7 % (ref 39.0–52.0)
Hemoglobin: 15.1 g/dL (ref 13.0–17.0)
MCH: 27 pg (ref 26.0–34.0)
MCHC: 33.8 g/dL (ref 30.0–36.0)
MCV: 80 fL (ref 80.0–100.0)
Platelets: 211 10*3/uL (ref 150–400)
RBC: 5.59 MIL/uL (ref 4.22–5.81)
RDW: 14.1 % (ref 11.5–15.5)
WBC: 5.4 10*3/uL (ref 4.0–10.5)
nRBC: 0 % (ref 0.0–0.2)

## 2023-10-24 LAB — COMPREHENSIVE METABOLIC PANEL
ALT: 41 U/L (ref 0–44)
AST: 56 U/L — ABNORMAL HIGH (ref 15–41)
Albumin: 4.2 g/dL (ref 3.5–5.0)
Alkaline Phosphatase: 51 U/L (ref 38–126)
Anion gap: 5 (ref 5–15)
BUN: 9 mg/dL (ref 6–20)
CO2: 28 mmol/L (ref 22–32)
Calcium: 9.3 mg/dL (ref 8.9–10.3)
Chloride: 106 mmol/L (ref 98–111)
Creatinine, Ser: 0.68 mg/dL (ref 0.61–1.24)
GFR, Estimated: >60 mL/min (ref >60)
Glucose, Bld: 93 mg/dL (ref 70–99)
Potassium: 3.6 mmol/L (ref 3.5–5.1)
Sodium: 139 mmol/L (ref 135–145)
Total Bilirubin: 1.3 mg/dL — ABNORMAL HIGH (ref 0.0–1.2)
Total Protein: 7 g/dL (ref 6.5–8.1)

## 2023-10-24 LAB — MAGNESIUM: Magnesium: 2.2 mg/dL (ref 1.7–2.4)

## 2023-10-24 LAB — FOLATE: Folate: 12.3 ng/mL (ref >5.9)

## 2023-10-24 MED ORDER — OLANZAPINE 10 MG IM SOLR
5.0000 mg | Freq: Four times a day (QID) | INTRAMUSCULAR | Status: DC | PRN
  Administered 2023-10-24: 5 mg via INTRAVENOUS
  Filled 2023-10-24 (×2): qty 10

## 2023-10-24 MED ORDER — PHENOBARBITAL 32.4 MG PO TABS
64.8000 mg | ORAL_TABLET | Freq: Three times a day (TID) | ORAL | Status: AC
  Administered 2023-10-24 – 2023-10-26 (×6): 64.8 mg via ORAL
  Filled 2023-10-24 (×6): qty 2

## 2023-10-24 MED ORDER — PHENOBARBITAL 32.4 MG PO TABS
32.4000 mg | ORAL_TABLET | Freq: Three times a day (TID) | ORAL | Status: DC
  Administered 2023-10-26: 32.4 mg via ORAL
  Filled 2023-10-24: qty 1

## 2023-10-24 NOTE — Plan of Care (Signed)
The patient has had waves of agitation and anxiety throughout the shift. He has scored high on the CIWA scale and received multiple doses of PRN anti-anxiety medication. These have provided him with adequate relief, and he has been able to rest. VSS. Fall precautions in place. Will continue to monitor.  Problem: Education: Goal: Knowledge of General Education information will improve Description: Including pain rating scale, medication(s)/side effects and non-pharmacologic comfort measures Outcome: Progressing   Problem: Health Behavior/Discharge Planning: Goal: Ability to manage health-related needs will improve Outcome: Progressing   Problem: Clinical Measurements: Goal: Ability to maintain clinical measurements within normal limits will improve Outcome: Progressing Goal: Will remain free from infection Outcome: Progressing Goal: Diagnostic test results will improve Outcome: Progressing Goal: Respiratory complications will improve Outcome: Progressing Goal: Cardiovascular complication will be avoided Outcome: Progressing   Problem: Activity: Goal: Risk for activity intolerance will decrease Outcome: Progressing   Problem: Nutrition: Goal: Adequate nutrition will be maintained Outcome: Progressing   Problem: Coping: Goal: Level of anxiety will decrease Outcome: Progressing   Problem: Elimination: Goal: Will not experience complications related to bowel motility Outcome: Progressing Goal: Will not experience complications related to urinary retention Outcome: Progressing   Problem: Pain Managment: Goal: General experience of comfort will improve and/or be controlled Outcome: Progressing   Problem: Safety: Goal: Ability to remain free from injury will improve Outcome: Progressing   Problem: Skin Integrity: Goal: Risk for impaired skin integrity will decrease Outcome: Progressing

## 2023-10-24 NOTE — Plan of Care (Signed)
  Problem: Education: Goal: Knowledge of General Education information will improve Description: Including pain rating scale, medication(s)/side effects and non-pharmacologic comfort measures Outcome: Progressing   Problem: Clinical Measurements: Goal: Ability to maintain clinical measurements within normal limits will improve Outcome: Progressing Goal: Will remain free from infection Outcome: Progressing   Problem: Nutrition: Goal: Adequate nutrition will be maintained Outcome: Progressing   Problem: Coping: Goal: Level of anxiety will decrease Outcome: Progressing   Problem: Pain Managment: Goal: General experience of comfort will improve and/or be controlled Outcome: Progressing   Problem: Safety: Goal: Ability to remain free from injury will improve Outcome: Progressing

## 2023-10-24 NOTE — Progress Notes (Signed)
PROGRESS NOTE    [name]  [id number]:[id number] DOB: 04-24-1971 DOA: 10/22/2023 PCP: [facility]    Brief Narrative:  53 year old with history of chronic alcohol use, recurrent admission due to withdrawals, PTSD comes to the ED with complaints of suicidal thoughts.  Patient is quite irritable and distressed.  Patient was IVC.D in the ED and psychiatry was consulted.   Assessment & Plan:  Principal Problem:   Alcohol abuse with alcohol-induced mood disorder (HCC) Active Problems:   Alcohol withdrawal (HCC)   PTSD (post-traumatic stress disorder)   Metabolic acidosis   Alcohol withdrawal History of chronic alcohol abuse Alcohol induced psychosis Sinus tachycardia-from alcoholdrawl Patient intoxicated upon admission, alcohol levels have trended down..  Continue alcohol withdrawal protocol.  Change Librium taper to phenobarbital  History of PTSD Alcohol induced psychosis Active suicide thoughts Patient is currently involuntarily committed.  Psychiatry team is following - Suicide and delirium protocol   Metabolic acidosis Resolved   History of hyperlipidemia - Not taking Lipitor anymore.     DVT prophylaxis: Lovenox    Code Status: Full Code Family Communication:   Continue hospital stay to see any further signs of withdrawal.   Subjective:  Seen at bedside.  Patient is very agitated and would like to be discharged.  I explained that he is not medically ready for discharge as he is scoring high on CIWA at times with significant agitation.  Additionally psychiatry is recommending inpatient psych.  Examination:  General exam: Appears calm and comfortable  Respiratory system: Clear to auscultation. Respiratory effort normal. Cardiovascular system: S1 & S2 heard, RRR. No JVD, murmurs, rubs, gallops or clicks. No pedal edema. Gastrointestinal system: Abdomen is nondistended, soft and nontender. No organomegaly or masses felt. Normal bowel sounds  heard. Central nervous system: Alert and oriented. No focal neurological deficits. Extremities: Symmetric 5 x 5 power. Skin: No rashes, lesions or ulcers Psychiatry: Judgement and insight appear normal. Mood & affect appropriate.                Diet Orders (From admission, onward)     Start     Ordered   10/22/23 1955  Diet Heart Room service appropriate? Yes; Fluid consistency: Thin  Diet effective now       Comments: Harm prevention tray.  Patient had previous suicidal thoughts.  Question Answer Comment  Room service appropriate? Yes   Fluid consistency: Thin      10/22/23 1954            Objective: Vitals:   10/23/23 2239 10/24/23 0000 10/24/23 0125 10/24/23 0608  BP: 93/64 93/64 96/63  118/74  Pulse: 65 65 64 79  Resp: 15  14 16   Temp: 99.2 F (37.3 C)  98.7 F (37.1 C) 98.3 F (36.8 C)  TempSrc:    Oral  SpO2: 97%  100% 97%  Weight:      Height:        Intake/Output Summary (Last 24 hours) at 10/24/2023 1155 Last data filed at 10/23/2023 2037 Gross per 24 hour  Intake 1360 ml  Output 1 ml  Net 1359 ml   Filed Weights   10/22/23 0915  Weight: 68 kg    Scheduled Meds:  enoxaparin (LOVENOX) injection  40 mg Subcutaneous Q24H   LORazepam  0-4 mg Intravenous Q6H   Or   LORazepam  0-4 mg Oral Q6H   LORazepam  0-4 mg Intravenous Q12H   Or   LORazepam  0-4 mg Oral Q12H   nicotine  21 mg Transdermal Daily   phenobarbital  64.8 mg Oral Q8H   Followed by   Melene Muller ON 10/26/2023] phenobarbital  32.4 mg Oral Q8H   sodium chloride flush  3 mL Intravenous Q12H   thiamine  100 mg Oral Daily   Or   thiamine  100 mg Intravenous Daily   Continuous Infusions:  Nutritional status     Body mass index is 21.51 kg/m.  Data Reviewed:   CBC: Recent Labs  Lab 10/22/23 1007 10/23/23 0538 10/24/23 0713  WBC 7.8 6.6 5.4  NEUTROABS 4.2  --   --   HGB 17.0 14.5 15.1  HCT 51.8 43.6 44.7  MCV 79.7* 80.3 80.0  PLT 276 218 211   Basic Metabolic  Panel: Recent Labs  Lab 10/22/23 1007 10/23/23 0538 10/24/23 0713  NA 142 139 139  K 3.8 3.7 3.6  CL 108 107 106  CO2 19* 23 28  GLUCOSE 89 79 93  BUN 8 16 9   CREATININE 0.64 0.72 0.68  CALCIUM 9.1 8.9 9.3  MG  --   --  2.2   GFR: Estimated Creatinine Clearance: 105.1 mL/min (by C-G formula based on SCr of 0.68 mg/dL). Liver Function Tests: Recent Labs  Lab 10/22/23 1007 10/23/23 0538 10/24/23 0713  AST 35 35 56*  ALT 26 24 41  ALKPHOS 56 48 51  BILITOT 0.7 1.6* 1.3*  PROT 8.2* 6.4* 7.0  ALBUMIN 4.7 3.7 4.2   No results for input(s): "LIPASE", "AMYLASE" in the last 168 hours. No results for input(s): "AMMONIA" in the last 168 hours. Coagulation Profile: No results for input(s): "INR", "PROTIME" in the last 168 hours. Cardiac Enzymes: No results for input(s): "CKTOTAL", "CKMB", "CKMBINDEX", "TROPONINI" in the last 168 hours. BNP (last 3 results) No results for input(s): "PROBNP" in the last 8760 hours. HbA1C: No results for input(s): "HGBA1C" in the last 72 hours. CBG: No results for input(s): "GLUCAP" in the last 168 hours. Lipid Profile: No results for input(s): "CHOL", "HDL", "LDLCALC", "TRIG", "CHOLHDL", "LDLDIRECT" in the last 72 hours. Thyroid Function Tests: Recent Labs    10/23/23 1129  TSH 0.783   Anemia Panel: Recent Labs    10/23/23 1129 10/24/23 0713  VITAMINB12 199  --   FOLATE  --  12.3   Sepsis Labs: No results for input(s): "PROCALCITON", "LATICACIDVEN" in the last 168 hours.  No results found for this or any previous visit (from the past 240 hours).       Radiology Studies: No results found.         LOS: 1 day   Time spent= 35 mins    Miguel Rota, MD [facility]  If 7PM-7AM, please contact night-coverage  10/24/2023, 11:55 AM

## 2023-10-24 NOTE — Consult Note (Signed)
[facility] Psychiatric Consult Initial  Patient Name: .[name]  MRN: [id number]  DOB: 02-May-1971  Consult Order details:  Orders (From admission, onward)     Start     Ordered   10/22/23 1226  CONSULT TO CALL ACT TEAM       Ordering Provider: Laurence Spates, MD  Provider:  (Not yet assigned)  Question:  Reason for Consult?  Answer:  Psych consult   10/22/23 1226             Mode of Visit: In person    Psychiatry Consult Evaluation  Service Date: October 24, 2023 LOS:  LOS: 1 day  Chief Complaint alcohol abuse  Primary Psychiatric Diagnoses  Alcohol abuse 2.   PTSD 3.   SI  Assessment  [name] is a 53 y.o. male admitted: Presented to the ED for 10/22/2023  9:12 AM for intoxication and alcohol abuse. He carries the psychiatric diagnoses of PTSD, bipolar disorder and has a past medical history of  none.   His current presentation of binge drinking is most consistent with alcohol abuse. He meets criteria for inpatient based on his current mood, and suicidal ideations.  Current outpatient psychotropic medications include gabapentin, and trazodone and historically he has had a positive response to these medications. He was compliant with medications prior to admission as evidenced by [name], patient girlfriend. On initial examination, patient is irritable and asking to be discharged. Please see plan below for detailed recommendations.   Diagnoses:  Active Hospital problems: Principal Problem:   Alcohol abuse with alcohol-induced mood disorder (HCC) Active Problems:   Alcohol withdrawal (HCC)   PTSD (post-traumatic stress disorder)   Metabolic acidosis    Plan   ## Psychiatric Medication Recommendations:  Continue CIWA protocol  Continue Ativan Detox  ## Medical Decision Making Capacity:  Patient is his own legal guardian  ## Further Work-up:  -- No further Work up needed at this time EKG or UDS -- most recent EKG on 10/22/23 had QtC  of 427 -- Pertinent labwork reviewed earlier this admission includes: CMP, EKG, UDS, BNP  ## Disposition:-- We recommend inpatient psychiatric hospitalization after medical hospitalization. Patient is involuntarily committed file on 10/22/23. Patient will be faxed out to [facility] to see if any bed availability.   ## Behavioral / Environmental: -Delirium Precautions: Delirium Interventions for Nursing and Staff: - RN to open blinds every AM. - To Bedside: Glasses, hearing aide, and pt's own shoes. Make available to patients. when possible and encourage use. - Encourage po fluids when appropriate, keep fluids within reach. - OOB to chair with meals. - Passive ROM exercises to all extremities with AM & PM care. - RN to assess orientation to person, time and place QAM and PRN. - Recommend extended visitation hours with familiar family/friends as feasible. - Staff to minimize disturbances at night. Turn off television when pt asleep or when not in use., Difficult Patient (SELECT OPTIONS FROM BELOW), To minimize splitting of staff, assign one staff person to communicate all information from the team when feasible., or Utilize compassion and acknowledge the patient's experiences while setting clear and realistic expectations for care.    ## Safety and Observation Level:  - Based on my clinical evaluation, I estimate the patient to be at low risk of self harm in the current setting. - At this time, we recommend  routine. This decision is based on my review of the chart including patient's history and current presentation, interview  of the patient, mental status examination, and consideration of suicide risk including evaluating suicidal ideation, plan, intent, suicidal or self-harm behaviors, risk factors, and protective factors. This judgment is based on our ability to directly address suicide risk, implement suicide prevention strategies, and develop a safety plan while the patient is in the clinical setting.  Please contact our team if there is a concern that risk level has changed.  CSSR Risk Category:C-SSRS RISK CATEGORY: High Risk  Suicide Risk Assessment: Patient has following modifiable risk factors for suicide: untreated depression and recklessness, which we are addressing by recommending inpatient psychiatric admission. Patient has following non-modifiable or demographic risk factors for suicide: male gender, history of suicide attempt, history of self harm behavior, and psychiatric hospitalization Patient has the following protective factors against suicide: Access to outpatient mental health care, Supportive family, and Supportive friends  Thank you for this consult request. Recommendations have been communicated to the primary team.  We will recommend inpatient at the [facility] and continue to follow  at this time.   Nanine Means, NP       History of Present Illness  Relevant Aspects of Hospital ED Course:  Admitted on 10/22/2023 for alcohol intoxication and abuse.   Patient Report:  [name], 53 y.o., male patient seen face to face by this provider, consulted with Dr. Toula Moos; and chart reviewed on 10/24/23.  On evaluation [name] reports that his girlfriend dropped him off at the hospital, and states she told the staff that he was having suicidal thoughts.  Patient currently is focused on discharge and denies SI/HI/AVH.  Patient states that he needs help he will go to the [place], he states that he is 100% disabled due to his PTSD.  Patient states that he does not need to be here in the hospital, and wants to go home.  Patient endorses alcohol seizures, and alcohol DTs.  Patient states that he has been to an outpatient rehab in 4 years ago, and has also completed a AA meetings.  During evaluation patient is irritable, with significant restlessness, he gives provider brief answers, and states that he does not want to speak with provider, he just wants to be discharged home.  He  states that he is not interested in starting any psychiatric medications, he feels that they have not worked previously, states he is open to doing outpatient therapy, but he will [facility]. He reports a good support system to include his significant other and his 37 year old son. He denies any DUIs or court dates pending. Patient denies access to weapons. He specifically denies any suicidal ideation, plan, or intent. He denies any homicidal ideation. He denies any auditory or visual hallucinations.   1/17: During evaluation [name] is sitting on the edge of his bed, irritable and then moderate distress.  He is alert, oriented x 4, anxious, and restless, uncooperative and guarded at times. His mood is anxious and irritable with congruent affect.  He has slurred speech, and irritable behavior.  Objectively there is no evidence of psychosis/mania or delusional thinking.  Patient is able to converse coherently, goal directed thoughts, no distractibility, or pre-occupation. He denies suicidal/self-harm/homicidal ideation, psychosis, and paranoia.    10/24/2023 Upon assessment, the patient is resting in bed with a sitter at his bedside.  He stated, "I feel great" and inquired about how soon he can leave. His girlfriend reported he was threatening to take his own life  multiple times prior to admission and drinking 30 beers daily.  Patient discussed this "isn't the first time she brought me in for threatening suicide while drinking". [name] explains that he was trying to break up with her this week and would never take his own life due to having a 10 y/o son who just graduated who he loves with his whole heart. He minimizes his actions prior to admission and currently denies depression and suicidal/homicidal ideations.  He is positive for anxiety especially around finances and paying bills.  He is fully disabled from the [employer] and follows up at the [facility].  His sleep is "fine", appetite "could be better".  He is experiencing tremors and "they just gave me Ativan and Seroquel", which was actually Ativan and phenobarbital.  Denies hallucinations and not responding to internal stimuli on assessment.  [name] is persistent with wanting to discharge despite still being in active withdrawal.  He will need to continue his recovery in inpatient, preferably at the [facility].    Psych ROS:  Depression: Denies Anxiety: Yes Mania (lifetime and current): Patient denies Psychosis: (lifetime and current): none  Collateral information:  During his ED stay, the PMHNP, Sabra Heck, spoke with patient's girlfriend [name] [phone], she states that patient needs a medical detox from alcohol, states he drinks alcohol around the clock.  She states he says things like "he does not want to live, and does not deserve to live.  " She states patient does not have any access to guns.  She states he will sober up for several months, but then his PTSD is triggered and he relapses.  She states he has recently been drinking alcohol for the past 4 days, which is mainly 2 cases of beer about 20-30 beers a day.  She confirms that he is fully connected to the [facility] in [place], and they are interested in helping him, and are waiting for the hospital [facility]) to give them a call.   Review of Systems  Constitutional: Negative.   HENT: Negative.    Eyes: Negative.   Respiratory: Negative.    Cardiovascular: Negative.   Gastrointestinal: Negative.   Genitourinary: Negative.   Musculoskeletal: Negative.   Skin: Negative.   Neurological:  Positive for tremors.  Endo/Heme/Allergies: Negative.   Psychiatric/Behavioral:  Positive for depression, substance abuse and suicidal ideas. The patient is nervous/anxious.      Psychiatric and Social History  Psychiatric History:  Information collected from patient's girlfriend and chart reviewed  Prev Dx/Sx: Bipolar,  PTSD Current Psych Provider: None Home Meds (current): Gabapentin and trazodone Previous Med Trials: Several, patient is not interested in any other psychiatric medications Therapy: None  Prior Psych Hospitalization: Yes Prior Self Harm: Yes Prior Violence: Yes  Family Psych History: Unknown Family Hx suicide: Unknown  Social History:  Developmental Hx: Patient appears to be age appropriate Educational Hx: Patient graduated from high school and served in the [employer] Occupational Hx: Unemployed, retired from [employer], fully disabled Armed forces operational officer Hx: Patient denies Living Situation: Lives alone Spiritual Hx: Unknown Access to weapons/lethal means: Denies  Substance History Alcohol: Yes Type of alcohol Beer Last Drink today 10/22/2023 Number of drinks per day 20-30 History of alcohol withdrawal seizures yes History of DT's yes Tobacco: Yes Illicit drugs: THC Prescription drug abuse: Denies Rehab hx: Yes  Exam Findings  Physical Exam: Vital Signs:  Temp:  [97.8 F (36.6 C)-99.2 F (37.3 C)] 98.3 F (36.8 C) (01/18 0608) Pulse Rate:  [64-90] 79 (01/18  4098) Resp:  [14-18] 16 (01/18 1191) BP: (93-132)/(63-96) 118/74 (01/18 4782) SpO2:  [97 %-100 %] 97 % (01/18 0608) Blood pressure 118/74, pulse 79, temperature 98.3 F (36.8 C), temperature source Oral, resp. rate 16, height 5\' 10"  (1.778 m), weight 68 kg, SpO2 97%. Body mass index is 21.51 kg/m.  Physical Exam Vitals and nursing note reviewed. Exam conducted with a chaperone present.  Constitutional:      Appearance: Normal appearance.  HENT:     Head: Normocephalic.     Nose: Nose normal.  Pulmonary:     Effort: Pulmonary effort is normal.  Musculoskeletal:        General: Normal range of motion.     Cervical back: Normal range of motion.  Neurological:     General: No focal deficit present.     Mental Status: He is alert and oriented to person, place, and time.  Psychiatric:        Attention and Perception:  Attention normal.        Mood and Affect: Mood is anxious. Affect is labile.        Speech: Speech is rapid and pressured and slurred.        Behavior: Behavior is agitated.        Thought Content: Thought content includes suicidal ideation.        Cognition and Memory: Memory normal.        Judgment: Judgment is inappropriate.     Mental Status Exam: General Appearance: Disheveled  Orientation:  Full (Time, Place, and Person)  Memory:  Immediate;   Fair Remote;   Fair  Concentration:  Concentration: Fair and Attention Span: Fair  Recall:  Fair  Attention  Fair  Eye Contact:  Good  Speech:  Normal Rate  Language:  Fair  Volume:  Normal  Mood: anxious  Affect:  Congruent  Thought Process:  Coherent  Thought Content:  WDL  Suicidal Thoughts:  Yes.  without intent/plan, threatening frequently prior to admission  Homicidal Thoughts:  No  Judgement:  Poor  Insight:  Lacking  Psychomotor Activity:  WDL  Akathisia:  No  Fund of Knowledge:  Fair    Assets:  Manufacturing systems engineer Desire for Improvement Intimacy Social Support  Cognition:  WNL  ADL's:  Intact  AIMS (if indicated):       Other History   These have been pulled in through the EMR, reviewed, and updated if appropriate.  Family History:  The patient's family history includes Alcohol abuse in his father.  Medical History: Past Medical History:  Diagnosis Date   Active smoker    Alcohol abuse    Anxiety    Bipolar 2 disorder (HCC)    Depression    PTSD (post-traumatic stress disorder)     Surgical History: Past Surgical History:  Procedure Laterality Date   HERNIA REPAIR     LYMPH NODE DISSECTION       Medications:   Current Facility-Administered Medications:    acetaminophen (TYLENOL) tablet 650 mg, 650 mg, Oral, Q6H PRN **OR** acetaminophen (TYLENOL) suppository 650 mg, 650 mg, Rectal, Q6H PRN, Sundil, Subrina, MD   bisacodyl (DULCOLAX) EC tablet 5 mg, 5 mg, Oral, Daily PRN, Sundil, Subrina,  MD   [COMPLETED] chlordiazePOXIDE (LIBRIUM) capsule 25 mg, 25 mg, Oral, QID, 25 mg at 10/23/23 1350 **FOLLOWED BY** chlordiazePOXIDE (LIBRIUM) capsule 25 mg, 25 mg, Oral, TID, 25 mg at 10/23/23 2149 **FOLLOWED BY** chlordiazePOXIDE (LIBRIUM) capsule 25 mg, 25 mg, Oral, BH-qamhs **FOLLOWED BY** [START ON  10/25/2023] chlordiazePOXIDE (LIBRIUM) capsule 25 mg, 25 mg, Oral, Daily, Sundil, Subrina, MD   enoxaparin (LOVENOX) injection 40 mg, 40 mg, Subcutaneous, Q24H, Sundil, Subrina, MD   guaiFENesin (ROBITUSSIN) 100 MG/5ML liquid 5 mL, 5 mL, Oral, Q4H PRN, Amin, Ankit C, MD   hydrALAZINE (APRESOLINE) injection 10 mg, 10 mg, Intravenous, Q4H PRN, Amin, Ankit C, MD   ipratropium-albuterol (DUONEB) 0.5-2.5 (3) MG/3ML nebulizer solution 3 mL, 3 mL, Nebulization, Q4H PRN, Amin, Ankit C, MD   LORazepam (ATIVAN) injection 0-4 mg, 0-4 mg, Intravenous, Q6H, 2 mg at 10/23/23 1150 **OR** LORazepam (ATIVAN) tablet 0-4 mg, 0-4 mg, Oral, Q6H, Sundil, Subrina, MD, 2 mg at 10/22/23 1505   LORazepam (ATIVAN) injection 0-4 mg, 0-4 mg, Intravenous, Q12H **OR** LORazepam (ATIVAN) tablet 0-4 mg, 0-4 mg, Oral, Q12H, Sundil, Subrina, MD   LORazepam (ATIVAN) injection 2 mg, 2 mg, Intravenous, Q4H PRN, Amin, Ankit C, MD, 2 mg at 10/23/23 1543   metoprolol tartrate (LOPRESSOR) injection 5 mg, 5 mg, Intravenous, Q4H PRN, Amin, Ankit C, MD   nicotine (NICODERM CQ - dosed in mg/24 hours) patch 21 mg, 21 mg, Transdermal, Daily, Amin, Ankit C, MD, 21 mg at 10/23/23 1715   ondansetron (ZOFRAN) tablet 4 mg, 4 mg, Oral, Q6H PRN **OR** ondansetron (ZOFRAN) injection 4 mg, 4 mg, Intravenous, Q6H PRN, Sundil, Subrina, MD   sodium chloride flush (NS) 0.9 % injection 3 mL, 3 mL, Intravenous, Q12H, Sundil, Subrina, MD, 3 mL at 10/23/23 0900   sodium chloride flush (NS) 0.9 % injection 3 mL, 3 mL, Intravenous, PRN, Sundil, Subrina, MD   thiamine (VITAMIN B1) tablet 100 mg, 100 mg, Oral, Daily, 100 mg at 10/23/23 1028 **OR** thiamine (VITAMIN B1)  injection 100 mg, 100 mg, Intravenous, Daily, Janalyn Shy, Subrina, MD  Allergies: Allergies  Allergen Reactions   Effexor [Venlafaxine] Hives   Paroxetine Hives   Nicoderm [Nicotine] Rash   Paxil [Paroxetine Hcl] Hives and Rash    Nanine Means, NP

## 2023-10-25 DIAGNOSIS — F1014 Alcohol abuse with alcohol-induced mood disorder: Secondary | ICD-10-CM | POA: Diagnosis not present

## 2023-10-25 LAB — CBC
HCT: 45.9 % (ref 39.0–52.0)
Hemoglobin: 15.2 g/dL (ref 13.0–17.0)
MCH: 26.8 pg (ref 26.0–34.0)
MCHC: 33.1 g/dL (ref 30.0–36.0)
MCV: 81 fL (ref 80.0–100.0)
Platelets: 206 10*3/uL (ref 150–400)
RBC: 5.67 MIL/uL (ref 4.22–5.81)
RDW: 14.1 % (ref 11.5–15.5)
WBC: 5.1 10*3/uL (ref 4.0–10.5)
nRBC: 0 % (ref 0.0–0.2)

## 2023-10-25 LAB — COMPREHENSIVE METABOLIC PANEL
ALT: 34 U/L (ref 0–44)
AST: 37 U/L (ref 15–41)
Albumin: 3.7 g/dL (ref 3.5–5.0)
Alkaline Phosphatase: 46 U/L (ref 38–126)
Anion gap: 5 (ref 5–15)
BUN: 10 mg/dL (ref 6–20)
CO2: 24 mmol/L (ref 22–32)
Calcium: 8.9 mg/dL (ref 8.9–10.3)
Chloride: 105 mmol/L (ref 98–111)
Creatinine, Ser: 0.68 mg/dL (ref 0.61–1.24)
GFR, Estimated: >60 mL/min (ref >60)
Glucose, Bld: 93 mg/dL (ref 70–99)
Potassium: 3.6 mmol/L (ref 3.5–5.1)
Sodium: 134 mmol/L — ABNORMAL LOW (ref 135–145)
Total Bilirubin: 1.1 mg/dL (ref 0.0–1.2)
Total Protein: 6.7 g/dL (ref 6.5–8.1)

## 2023-10-25 LAB — MAGNESIUM: Magnesium: 2.1 mg/dL (ref 1.7–2.4)

## 2023-10-25 MED ORDER — ORAL CARE MOUTH RINSE: 15.0000 mL | OROMUCOSAL | Status: DC | PRN

## 2023-10-25 MED ORDER — HALOPERIDOL LACTATE 5 MG/ML IJ SOLN
5.0000 mg | Freq: Three times a day (TID) | INTRAMUSCULAR | Status: DC | PRN
  Administered 2023-10-26: 5 mg via INTRAMUSCULAR
  Filled 2023-10-25: qty 1

## 2023-10-25 MED ORDER — HALOPERIDOL 5 MG PO TABS
5.0000 mg | ORAL_TABLET | Freq: Three times a day (TID) | ORAL | Status: DC | PRN
Start: 2023-10-25 — End: 2023-10-26
  Administered 2023-10-25: 5 mg via ORAL
  Filled 2023-10-25: qty 1

## 2023-10-25 MED ORDER — GABAPENTIN 300 MG PO CAPS
300.0000 mg | ORAL_CAPSULE | Freq: Three times a day (TID) | ORAL | Status: DC
  Administered 2023-10-25 (×3): 300 mg via ORAL
  Filled 2023-10-25 (×4): qty 1

## 2023-10-25 NOTE — Plan of Care (Signed)
  Problem: Clinical Measurements: Goal: Will remain free from infection Outcome: Progressing Goal: Diagnostic test results will improve Outcome: Progressing Goal: Respiratory complications will improve Outcome: Progressing Goal: Cardiovascular complication will be avoided Outcome: Progressing   Problem: Activity: Goal: Risk for activity intolerance will decrease Outcome: Progressing   Problem: Nutrition: Goal: Adequate nutrition will be maintained Outcome: Progressing   Problem: Coping: Goal: Level of anxiety will decrease Outcome: Progressing   Problem: Elimination: Goal: Will not experience complications related to bowel motility Outcome: Progressing Goal: Will not experience complications related to urinary retention Outcome: Progressing   Problem: Pain Managment: Goal: General experience of comfort will improve and/or be controlled Outcome: Progressing   Problem: Safety: Goal: Ability to remain free from injury will improve Outcome: Progressing   Problem: Skin Integrity: Goal: Risk for impaired skin integrity will decrease Outcome: Progressing

## 2023-10-25 NOTE — Plan of Care (Signed)

## 2023-10-25 NOTE — Consult Note (Signed)
[facility] Psychiatric Consult Initial  Patient Name: .[name]  MRN: [id number]  DOB: [name] 05, 1972  Consult Order details:  Orders (From admission, onward)     Start     Ordered   10/22/23 1226  CONSULT TO CALL ACT TEAM       Ordering Provider: Laurence Spates, MD  Provider:  (Not yet assigned)  Question:  Reason for Consult?  Answer:  Psych consult   10/22/23 1226             Mode of Visit: In person    Psychiatry Consult Evaluation  Service Date: October 25, 2023 LOS:  LOS: 2 days  Chief Complaint alcohol abuse  Primary Psychiatric Diagnoses  Alcohol abuse 2.   PTSD 3.   SI  Assessment  [name] is a 53 y.o. male admitted: Presented to the ED for 10/22/2023  9:12 AM for intoxication and alcohol abuse. He carries the psychiatric diagnoses of PTSD, bipolar disorder and has a past medical history of  none.   His current presentation of binge drinking is most consistent with alcohol abuse. He meets criteria for inpatient based on his current mood, and suicidal ideations.  Current outpatient psychotropic medications include gabapentin, and trazodone and historically he has had a positive response to these medications. He was compliant with medications prior to admission as evidenced by Dawayne Cirri, patient girlfriend. On initial examination, patient is irritable and asking to be discharged. Please see plan below for detailed recommendations.   Diagnoses:  Active Hospital problems: Principal Problem:   Alcohol abuse with alcohol-induced mood disorder (HCC) Active Problems:   Alcohol withdrawal (HCC)   PTSD (post-traumatic stress disorder)   Metabolic acidosis    Plan   ## Psychiatric Medication Recommendations:  Continue CIWA protocol  Continue Ativan and phenobarbital detox Gabapentin 300 mg TID added to assist with anxiety and detox  ## Medical Decision Making Capacity:  Patient is his own legal guardian  ## Further Work-up:  -- No  further Work up needed at this time EKG or UDS -- most recent EKG on 10/22/23 had QtC of 427 -- Pertinent labwork reviewed earlier this admission includes: CMP, EKG, UDS, BNP  ## Disposition:-- We recommend inpatient psychiatric hospitalization after medical hospitalization. Patient is involuntarily committed file on 10/22/23. Patient will be faxed out to [facility] to see if any bed availability.   ## Behavioral / Environmental: -Delirium Precautions: Delirium Interventions for Nursing and Staff: - RN to open blinds every AM. - To Bedside: Glasses, hearing aide, and pt's own shoes. Make available to patients. when possible and encourage use. - Encourage po fluids when appropriate, keep fluids within reach. - OOB to chair with meals. - Passive ROM exercises to all extremities with AM & PM care. - RN to assess orientation to person, time and place QAM and PRN. - Recommend extended visitation hours with familiar family/friends as feasible. - Staff to minimize disturbances at night. Turn off television when pt asleep or when not in use., Difficult Patient (SELECT OPTIONS FROM BELOW), To minimize splitting of staff, assign one staff person to communicate all information from the team when feasible., or Utilize compassion and acknowledge the patient's experiences while setting clear and realistic expectations for care.    ## Safety and Observation Level:  - Based on my clinical evaluation, I estimate the patient to be at low risk of self harm in the current setting. - At this time, we recommend  routine. This decision is based  on my review of the chart including patient's history and current presentation, interview of the patient, mental status examination, and consideration of suicide risk including evaluating suicidal ideation, plan, intent, suicidal or self-harm behaviors, risk factors, and protective factors. This judgment is based on our ability to directly address suicide risk, implement suicide  prevention strategies, and develop a safety plan while the patient is in the clinical setting. Please contact our team if there is a concern that risk level has changed.  CSSR Risk Category:C-SSRS RISK CATEGORY: High Risk  Suicide Risk Assessment: Patient has following modifiable risk factors for suicide: untreated depression and recklessness, which we are addressing by recommending inpatient psychiatric admission. Patient has following non-modifiable or demographic risk factors for suicide: male gender, history of suicide attempt, history of self harm behavior, and psychiatric hospitalization Patient has the following protective factors against suicide: Access to outpatient mental health care, Supportive family, and Supportive friends  Thank you for this consult request. Recommendations have been communicated to the primary team.  We will recommend inpatient at the [place] and continue to follow  at this time.   Nanine Means, NP       History of Present Illness  Relevant Aspects of Hospital ED Course:  Admitted on 10/22/2023 for alcohol intoxication and abuse.   Patient Report:  [name], 53 y.o., male patient seen face to face by this provider, consulted with Dr. Toula Moos; and chart reviewed on 10/25/23.  On evaluation [name] reports that his girlfriend dropped him off at the hospital, and states she told the staff that he was having suicidal thoughts.  Patient currently is focused on discharge and denies SI/HI/AVH.  Patient states that he needs help he will go to the [place], he states that he is 100% disabled due to his PTSD.  Patient states that he does not need to be here in the hospital, and wants to go home.  Patient endorses alcohol seizures, and alcohol DTs.  Patient states that he has been to an outpatient rehab in 4 years ago, and has also completed a AA meetings.  During evaluation patient is irritable, with significant restlessness, he gives provider brief answers, and  states that he does not want to speak with provider, he just wants to be discharged home.  He states that he is not interested in starting any psychiatric medications, he feels that they have not worked previously, states he is open to doing outpatient therapy, but he will see if he can [facility] one [facility] the [facility]. He reports a good support system to include his significant other and his 56 year old son. He denies any DUIs or court dates pending. Patient denies access to weapons. He specifically denies any suicidal ideation, plan, or intent. He denies any homicidal ideation. He denies any auditory or visual hallucinations.   1/17: During evaluation [name] is sitting on the edge of his bed, irritable and then moderate distress.  He is alert, oriented x 4, anxious, and restless, uncooperative and guarded at times. His mood is anxious and irritable with congruent affect.  He has slurred speech, and irritable behavior.  Objectively there is no evidence of psychosis/mania or delusional thinking.  Patient is able to converse coherently, goal directed thoughts, no distractibility, or pre-occupation. He denies suicidal/self-harm/homicidal ideation, psychosis, and paranoia.    10/24/2023 Upon assessment, the patient is resting in bed with a sitter at his bedside.  He stated, "I feel great" and inquired about how soon he  can leave. His girlfriend reported he was threatening to take his own life multiple times prior to admission and drinking 30 beers daily.  Patient discussed this "isn't the first time she brought me in for threatening suicide while drinking". [name] explains that he was trying to break up with her this week and would never take his own life due to having a 63 y/o son who just graduated who he loves with his whole heart. He minimizes his actions prior to admission and currently denies depression and suicidal/homicidal ideations.  He is positive for anxiety especially around finances and paying  bills.  He is fully disabled from the [employer] and follows up [facility] the [facility].  His sleep is "fine", appetite "could be better".  He is experiencing tremors and "they just gave me Ativan and Seroquel", which was actually Ativan and phenobarbital.  Denies hallucinations and not responding to internal stimuli on assessment.  [name] is persistent with wanting to discharge despite still being in active withdrawal.  He will need to continue his recovery in inpatient, preferably at the [place].    10/25/2023: The client was briefly seen today as yesterday he was demanding to leave and his behaviors escalated when he saw the team. He was eating his breakfast heartily with no distress noted.  He stated, "I'm fine."  His sleep was "good", denies withdrawals and no visible tremors when he extended his arms.  "I feel so good, I'm ready to leave."  He was easily redirected about not leaving without an increase in behaviors.  His RN said yesterday that he came out into the hallway and was "cussing and yelling" with verbal de-escalation working.  The RN does feel the change to phenobarbital by Dr Nelson Chimes was helpful.  Dr. Nelson Chimes discussed his presentation in the lounge as being very labile with cooperating to pacing and threatening to harm, vacillating within the assessment, no physical assaults.  Yesterday, he was calm on assessment until near the end when he stood up and was wanting to walk out with the team with affirmations that he did not need to be in the hospital.  He continues to meet inpatient criteria for hospitalization, awaiting a VA placement. Medications adjusted, see plan above (Haldol PRN vs. Zyprexa and addition of gabapentin to assist with anxiety and withdrawal).  Psych ROS:  Depression: Denies Anxiety: Yes Mania (lifetime and current): Patient denies Psychosis: (lifetime and current): none  Collateral information:  During his ED stay, the PMHNP, Sabra Heck, spoke with patient's  girlfriend Dawayne Cirri [phone], she states that patient needs a medical detox from alcohol, states he drinks alcohol around the clock.  She states he says things like "he does not want to live, and does not deserve to live.  " She states patient does not have any access to guns.  She states he will sober up for several months, but then his PTSD is triggered and he relapses.  She states he has recently been drinking alcohol for the past 4 days, which is mainly 2 cases of beer about 20-30 beers a day.  She confirms that he is fully connected to the [place] in [place], and they are interested in helping him, and are waiting for the hospital [facility]) to give them a call.   Review of Systems  Constitutional: Negative.   HENT: Negative.    Eyes: Negative.   Respiratory: Negative.    Cardiovascular: Negative.   Gastrointestinal: Negative.   Genitourinary: Negative.   Musculoskeletal: Negative.  Skin: Negative.   Neurological:  Positive for tremors.  Endo/Heme/Allergies: Negative.   Psychiatric/Behavioral:  Positive for depression, substance abuse and suicidal ideas. The patient is nervous/anxious.      Psychiatric and Social History  Psychiatric History:  Information collected from patient's girlfriend and chart reviewed  Prev Dx/Sx: Bipolar, PTSD Current Psych Provider: None Home Meds (current): Gabapentin and trazodone Previous Med Trials: Several, patient is not interested in any other psychiatric medications Therapy: None  Prior Psych Hospitalization: Yes Prior Self Harm: Yes Prior Violence: Yes  Family Psych History: Unknown Family Hx suicide: Unknown  Social History:  Developmental Hx: Patient appears to be age appropriate Educational Hx: Patient graduated from high school and served in the [employer] Occupational Hx: Unemployed, retired from [employer], fully disabled Armed forces operational officer Hx: Patient denies Living Situation: Lives alone Spiritual Hx: Unknown Access to weapons/lethal  means: Denies  Substance History Alcohol: Yes Type of alcohol Beer Last Drink today 10/22/2023 Number of drinks per day 20-30 History of alcohol withdrawal seizures yes History of DT's yes Tobacco: Yes Illicit drugs: THC Prescription drug abuse: Denies Rehab hx: Yes  Exam Findings  Physical Exam: Vital Signs:  Temp:  [97.9 F (36.6 C)-99 F (37.2 C)] 98.6 F (37 C) (01/19 0551) Pulse Rate:  [78-97] 78 (01/19 0551) Resp:  [14-20] 14 (01/19 0551) BP: (99-130)/(62-114) 99/75 (01/19 0551) SpO2:  [96 %-100 %] 96 % (01/19 0551) Blood pressure 99/75, pulse 78, temperature 98.6 F (37 C), resp. rate 14, height 5\' 10"  (1.778 m), weight 68 kg, SpO2 96%. Body mass index is 21.51 kg/m.  Physical Exam Vitals and nursing note reviewed. Exam conducted with a chaperone present.  Constitutional:      Appearance: Normal appearance.  HENT:     Head: Normocephalic.     Nose: Nose normal.  Pulmonary:     Effort: Pulmonary effort is normal.  Musculoskeletal:        General: Normal range of motion.     Cervical back: Normal range of motion.  Neurological:     General: No focal deficit present.     Mental Status: He is alert and oriented to person, place, and time.  Psychiatric:        Attention and Perception: Attention normal.        Mood and Affect: Mood is anxious. Affect is labile.        Speech: Speech is rapid and pressured and slurred.        Behavior: Behavior is agitated.        Thought Content: Thought content includes suicidal ideation.        Cognition and Memory: Memory normal.        Judgment: Judgment is inappropriate.     Mental Status Exam: General Appearance: Disheveled  Orientation:  Full (Time, Place, and Person)  Memory:  Immediate;   Fair Remote;   Fair  Concentration:  Concentration: Fair and Attention Span: Fair  Recall:  Fair  Attention  Fair  Eye Contact:  Good  Speech:  Normal Rate  Language:  Fair  Volume:  Normal  Mood: anxious  Affect:   Congruent  Thought Process:  Coherent  Thought Content:  WDL  Suicidal Thoughts:  Yes.  without intent/plan, threatening frequently prior to admission  Homicidal Thoughts:  No  Judgement:  Poor  Insight:  Lacking  Psychomotor Activity:  WDL  Akathisia:  No  Fund of Knowledge:  Fair    Assets:  Communication Skills Desire for Improvement Intimacy Social Support  Cognition:  WNL  ADL's:  Intact  AIMS (if indicated):       Other History   These have been pulled in through the EMR, reviewed, and updated if appropriate.  Family History:  The patient's family history includes Alcohol abuse in his father.  Medical History: Past Medical History:  Diagnosis Date   Active smoker    Alcohol abuse    Anxiety    Bipolar 2 disorder (HCC)    Depression    PTSD (post-traumatic stress disorder)     Surgical History: Past Surgical History:  Procedure Laterality Date   HERNIA REPAIR     LYMPH NODE DISSECTION       Medications:   Current Facility-Administered Medications:    acetaminophen (TYLENOL) tablet 650 mg, 650 mg, Oral, Q6H PRN **OR** acetaminophen (TYLENOL) suppository 650 mg, 650 mg, Rectal, Q6H PRN, Sundil, Subrina, MD   bisacodyl (DULCOLAX) EC tablet 5 mg, 5 mg, Oral, Daily PRN, Sundil, Subrina, MD   enoxaparin (LOVENOX) injection 40 mg, 40 mg, Subcutaneous, Q24H, Sundil, Subrina, MD   gabapentin (NEURONTIN) capsule 300 mg, 300 mg, Oral, TID, Clayvon Parlett, Herminio Heads, NP   guaiFENesin (ROBITUSSIN) 100 MG/5ML liquid 5 mL, 5 mL, Oral, Q4H PRN, Amin, Ankit C, MD   haloperidol (HALDOL) tablet 5 mg, 5 mg, Oral, Q8H PRN **OR** haloperidol lactate (HALDOL) injection 5 mg, 5 mg, Intramuscular, Q8H PRN, Celica Kotowski, Herminio Heads, NP   hydrALAZINE (APRESOLINE) injection 10 mg, 10 mg, Intravenous, Q4H PRN, Amin, Ankit C, MD   ipratropium-albuterol (DUONEB) 0.5-2.5 (3) MG/3ML nebulizer solution 3 mL, 3 mL, Nebulization, Q4H PRN, Amin, Ankit C, MD   LORazepam (ATIVAN) injection 0-4 mg, 0-4 mg,  Intravenous, Q12H **OR** LORazepam (ATIVAN) tablet 0-4 mg, 0-4 mg, Oral, Q12H, Sundil, Subrina, MD, 2 mg at 10/25/23 0830   LORazepam (ATIVAN) injection 2 mg, 2 mg, Intravenous, Q4H PRN, Amin, Ankit C, MD, 2 mg at 10/24/23 1416   metoprolol tartrate (LOPRESSOR) injection 5 mg, 5 mg, Intravenous, Q4H PRN, Amin, Ankit C, MD   nicotine (NICODERM CQ - dosed in mg/24 hours) patch 21 mg, 21 mg, Transdermal, Daily, Amin, Ankit C, MD, 21 mg at 10/25/23 0830   ondansetron (ZOFRAN) tablet 4 mg, 4 mg, Oral, Q6H PRN **OR** ondansetron (ZOFRAN) injection 4 mg, 4 mg, Intravenous, Q6H PRN, Janalyn Shy, Subrina, MD   Oral care mouth rinse, 15 mL, Mouth Rinse, PRN, Amin, Ankit C, MD   PHENobarbital (LUMINAL) tablet 64.8 mg, 64.8 mg, Oral, Q8H, 64.8 mg at 10/25/23 0426 **FOLLOWED BY** [START ON 10/26/2023] PHENobarbital (LUMINAL) tablet 32.4 mg, 32.4 mg, Oral, Q8H, Amin, Ankit C, MD   sodium chloride flush (NS) 0.9 % injection 3 mL, 3 mL, Intravenous, Q12H, Sundil, Subrina, MD, 3 mL at 10/25/23 0831   sodium chloride flush (NS) 0.9 % injection 3 mL, 3 mL, Intravenous, PRN, Sundil, Subrina, MD   thiamine (VITAMIN B1) tablet 100 mg, 100 mg, Oral, Daily, 100 mg at 10/25/23 0830 **OR** thiamine (VITAMIN B1) injection 100 mg, 100 mg, Intravenous, Daily, Janalyn Shy, Subrina, MD  Allergies: Allergies  Allergen Reactions   Effexor [Venlafaxine] Hives   Paroxetine Hives   Nicoderm [Nicotine] Rash   Paxil [Paroxetine Hcl] Hives and Rash    Nanine Means, NP

## 2023-10-25 NOTE — Progress Notes (Signed)
PROGRESS NOTE    [name]  HKV:[id number] DOB: 1971/07/12 DOA: 10/22/2023 PCP: [facility]    Brief Narrative:  53 year old with history of chronic alcohol use, recurrent admission due to withdrawals, PTSD comes to the ED with complaints of suicidal thoughts.  Patient is quite irritable and distressed.  Patient was IVC.D in the ED and psychiatry was consulted.   Assessment & Plan:  Principal Problem:   Alcohol abuse with alcohol-induced mood disorder (HCC) Active Problems:   Alcohol withdrawal (HCC)   PTSD (post-traumatic stress disorder)   Metabolic acidosis   Alcohol withdrawal History of chronic alcohol abuse Alcohol induced psychosis Sinus tachycardia-from alcoholdrawl Patient intoxicated upon admission, alcohol levels have trended down..  Continue alcohol withdrawal protocol.  Phenobarbital working better.  Gabapentin added.  Discussed with psychiatry.  History of PTSD Alcohol induced psychosis Active suicide thoughts Patient is currently involuntarily committed.  Psychiatry team is following - Suicide and delirium protocol   Metabolic acidosis Resolved   History of hyperlipidemia - Not taking Lipitor anymore.     DVT prophylaxis: Lovenox    Code Status: Full Code Family Communication:   Continue hospital stay to see any further signs of withdrawal.   Subjective: Resting. I did not wake him up as he frequently gets severely agitated.   Examination:  Resting comfortably, does not appear to be in any distress               Diet Orders (From admission, onward)     Start     Ordered   10/22/23 1955  Diet Heart Room service appropriate? Yes; Fluid consistency: Thin  Diet effective now       Comments: Harm prevention tray.  Patient had previous suicidal thoughts.  Question Answer Comment  Room service appropriate? Yes   Fluid consistency: Thin      10/22/23 1954            Objective: Vitals:   10/24/23 1411  10/24/23 1700 10/24/23 1951 10/25/23 0551  BP: 106/62 104/80 (!) 130/114 99/75  Pulse: 81 96 97 78  Resp: 20 18 14 14   Temp: 98.3 F (36.8 C) 97.9 F (36.6 C) 99 F (37.2 C) 98.6 F (37 C)  TempSrc: Oral Oral Oral   SpO2: 100% 96% 97% 96%  Weight:      Height:        Intake/Output Summary (Last 24 hours) at 10/25/2023 1144 Last data filed at 10/25/2023 1052 Gross per 24 hour  Intake 840 ml  Output 2 ml  Net 838 ml   Filed Weights   10/22/23 0915  Weight: 68 kg    Scheduled Meds:  enoxaparin (LOVENOX) injection  40 mg Subcutaneous Q24H   gabapentin  300 mg Oral TID   LORazepam  0-4 mg Intravenous Q12H   Or   LORazepam  0-4 mg Oral Q12H   nicotine  21 mg Transdermal Daily   phenobarbital  64.8 mg Oral Q8H   Followed by   Melene Muller ON 10/26/2023] phenobarbital  32.4 mg Oral Q8H   sodium chloride flush  3 mL Intravenous Q12H   thiamine  100 mg Oral Daily   Or   thiamine  100 mg Intravenous Daily   Continuous Infusions:  Nutritional status     Body mass index is 21.51 kg/m.  Data Reviewed:   CBC: Recent Labs  Lab 10/22/23 1007 10/23/23 0538 10/24/23 0713 10/25/23 0555  WBC 7.8 6.6 5.4 5.1  NEUTROABS 4.2  --   --   --  HGB 17.0 14.5 15.1 15.2  HCT 51.8 43.6 44.7 45.9  MCV 79.7* 80.3 80.0 81.0  PLT 276 218 211 206   Basic Metabolic Panel: Recent Labs  Lab 10/22/23 1007 10/23/23 0538 10/24/23 0713 10/25/23 0555  NA 142 139 139 134*  K 3.8 3.7 3.6 3.6  CL 108 107 106 105  CO2 19* 23 28 24   GLUCOSE 89 79 93 93  BUN 8 16 9 10   CREATININE 0.64 0.72 0.68 0.68  CALCIUM 9.1 8.9 9.3 8.9  MG  --   --  2.2 2.1   GFR: Estimated Creatinine Clearance: 105.1 mL/min (by C-G formula based on SCr of 0.68 mg/dL). Liver Function Tests: Recent Labs  Lab 10/22/23 1007 10/23/23 0538 10/24/23 0713 10/25/23 0555  AST 35 35 56* 37  ALT 26 24 41 34  ALKPHOS 56 48 51 46  BILITOT 0.7 1.6* 1.3* 1.1  PROT 8.2* 6.4* 7.0 6.7  ALBUMIN 4.7 3.7 4.2 3.7   No results  for input(s): "LIPASE", "AMYLASE" in the last 168 hours. No results for input(s): "AMMONIA" in the last 168 hours. Coagulation Profile: No results for input(s): "INR", "PROTIME" in the last 168 hours. Cardiac Enzymes: No results for input(s): "CKTOTAL", "CKMB", "CKMBINDEX", "TROPONINI" in the last 168 hours. BNP (last 3 results) No results for input(s): "PROBNP" in the last 8760 hours. HbA1C: No results for input(s): "HGBA1C" in the last 72 hours. CBG: No results for input(s): "GLUCAP" in the last 168 hours. Lipid Profile: No results for input(s): "CHOL", "HDL", "LDLCALC", "TRIG", "CHOLHDL", "LDLDIRECT" in the last 72 hours. Thyroid Function Tests: Recent Labs    10/23/23 1129  TSH 0.783   Anemia Panel: Recent Labs    10/23/23 1129 10/24/23 0713  VITAMINB12 199  --   FOLATE  --  12.3   Sepsis Labs: No results for input(s): "PROCALCITON", "LATICACIDVEN" in the last 168 hours.  No results found for this or any previous visit (from the past 240 hours).       Radiology Studies: No results found.         LOS: 2 days   Time spent= 35 mins    Miguel Rota, MD [facility]  If 7PM-7AM, please contact night-coverage  10/25/2023, 11:44 AM

## 2023-10-26 ENCOUNTER — Other Ambulatory Visit: Payer: Self-pay

## 2023-10-26 ENCOUNTER — Inpatient Hospital Stay (HOSPITAL_COMMUNITY)
Admission: AD | Admit: 2023-10-26 | Discharge: 2023-10-29 | DRG: 882 | Disposition: A | Discharge status: Home / Self Care | Payer: No Typology Code available for payment source | Source: Intra-hospital | Attending: Psychiatry | Admitting: Psychiatry

## 2023-10-26 ENCOUNTER — Encounter (HOSPITAL_COMMUNITY): Payer: Self-pay | Admitting: Nurse Practitioner

## 2023-10-26 DIAGNOSIS — Z888 Allergy status to other drugs, medicaments and biological substances status: Secondary | ICD-10-CM

## 2023-10-26 DIAGNOSIS — Z818 Family history of other mental and behavioral disorders: Secondary | ICD-10-CM | POA: Diagnosis not present

## 2023-10-26 DIAGNOSIS — F17210 Nicotine dependence, cigarettes, uncomplicated: Secondary | ICD-10-CM | POA: Diagnosis present

## 2023-10-26 DIAGNOSIS — F4323 Adjustment disorder with mixed anxiety and depressed mood: Principal | ICD-10-CM | POA: Diagnosis present

## 2023-10-26 DIAGNOSIS — R45851 Suicidal ideations: Secondary | ICD-10-CM | POA: Diagnosis present

## 2023-10-26 DIAGNOSIS — F1024 Alcohol dependence with alcohol-induced mood disorder: Secondary | ICD-10-CM | POA: Diagnosis present

## 2023-10-26 DIAGNOSIS — F4310 Post-traumatic stress disorder, unspecified: Secondary | ICD-10-CM | POA: Diagnosis present

## 2023-10-26 DIAGNOSIS — F1020 Alcohol dependence, uncomplicated: Secondary | ICD-10-CM | POA: Diagnosis present

## 2023-10-26 DIAGNOSIS — Z5941 Food insecurity: Secondary | ICD-10-CM

## 2023-10-26 DIAGNOSIS — F1014 Alcohol abuse with alcohol-induced mood disorder: Secondary | ICD-10-CM | POA: Diagnosis not present

## 2023-10-26 DIAGNOSIS — F1094 Alcohol use, unspecified with alcohol-induced mood disorder: Principal | ICD-10-CM | POA: Diagnosis present

## 2023-10-26 LAB — COMPREHENSIVE METABOLIC PANEL
ALT: 28 U/L (ref 0–44)
AST: 26 U/L (ref 15–41)
Albumin: 3.8 g/dL (ref 3.5–5.0)
Alkaline Phosphatase: 44 U/L (ref 38–126)
Anion gap: 11 (ref 5–15)
BUN: 13 mg/dL (ref 6–20)
CO2: 24 mmol/L (ref 22–32)
Calcium: 8.9 mg/dL (ref 8.9–10.3)
Chloride: 105 mmol/L (ref 98–111)
Creatinine, Ser: 0.56 mg/dL — ABNORMAL LOW (ref 0.61–1.24)
GFR, Estimated: >60 mL/min (ref >60)
Glucose, Bld: 87 mg/dL (ref 70–99)
Potassium: 3.6 mmol/L (ref 3.5–5.1)
Sodium: 140 mmol/L (ref 135–145)
Total Bilirubin: 0.7 mg/dL (ref 0.0–1.2)
Total Protein: 6.8 g/dL (ref 6.5–8.1)

## 2023-10-26 LAB — MAGNESIUM: Magnesium: 2.2 mg/dL (ref 1.7–2.4)

## 2023-10-26 LAB — CBC
HCT: 46.6 % (ref 39.0–52.0)
Hemoglobin: 15.3 g/dL (ref 13.0–17.0)
MCH: 26.7 pg (ref 26.0–34.0)
MCHC: 32.8 g/dL (ref 30.0–36.0)
MCV: 81.5 fL (ref 80.0–100.0)
Platelets: 199 10*3/uL (ref 150–400)
RBC: 5.72 MIL/uL (ref 4.22–5.81)
RDW: 14 % (ref 11.5–15.5)
WBC: 5.3 10*3/uL (ref 4.0–10.5)
nRBC: 0 % (ref 0.0–0.2)

## 2023-10-26 MED ORDER — ACETAMINOPHEN 325 MG PO TABS
650.0000 mg | ORAL_TABLET | Freq: Four times a day (QID) | ORAL | Status: DC | PRN
  Administered 2023-10-28 (×2): 650 mg via ORAL
  Filled 2023-10-26 (×2): qty 2

## 2023-10-26 MED ORDER — MAGNESIUM HYDROXIDE 400 MG/5ML PO SUSP: 30.0000 mL | Freq: Every day | ORAL | Status: DC | PRN

## 2023-10-26 MED ORDER — BISACODYL 5 MG PO TBEC: 5.0000 mg | DELAYED_RELEASE_TABLET | Freq: Every day | ORAL | Status: DC | PRN

## 2023-10-26 MED ORDER — GUAIFENESIN 100 MG/5ML PO LIQD: 5.0000 mL | ORAL | Status: DC | PRN

## 2023-10-26 MED ORDER — DIPHENHYDRAMINE HCL 50 MG/ML IJ SOLN: 50.0000 mg | Freq: Three times a day (TID) | INTRAMUSCULAR | Status: DC | PRN

## 2023-10-26 MED ORDER — VITAMIN B-1 100 MG PO TABS
100.0000 mg | ORAL_TABLET | Freq: Every day | ORAL | Status: DC
  Administered 2023-10-27 – 2023-10-29 (×3): 100 mg via ORAL
  Filled 2023-10-26 (×5): qty 1

## 2023-10-26 MED ORDER — LORAZEPAM 2 MG/ML IJ SOLN
2.0000 mg | INTRAMUSCULAR | Status: DC | PRN
Start: 2023-10-26 — End: 2023-10-29

## 2023-10-26 MED ORDER — THIAMINE HCL 100 MG/ML IJ SOLN
100.0000 mg | Freq: Every day | INTRAMUSCULAR | Status: DC
  Filled 2023-10-26: qty 2

## 2023-10-26 MED ORDER — ALUM & MAG HYDROXIDE-SIMETH 200-200-20 MG/5ML PO SUSP: 30.0000 mL | ORAL | Status: DC | PRN

## 2023-10-26 MED ORDER — LORAZEPAM 2 MG/ML IJ SOLN: 2.0000 mg | Freq: Three times a day (TID) | INTRAMUSCULAR | Status: DC | PRN

## 2023-10-26 MED ORDER — IPRATROPIUM-ALBUTEROL 0.5-2.5 (3) MG/3ML IN SOLN: 3.0000 mL | RESPIRATORY_TRACT | Status: DC | PRN

## 2023-10-26 MED ORDER — DIPHENHYDRAMINE HCL 25 MG PO CAPS: 50.0000 mg | ORAL_CAPSULE | Freq: Three times a day (TID) | ORAL | Status: DC | PRN

## 2023-10-26 MED ORDER — HALOPERIDOL 5 MG PO TABS: 5.0000 mg | ORAL_TABLET | Freq: Three times a day (TID) | ORAL | Status: DC | PRN

## 2023-10-26 MED ORDER — ENSURE ENLIVE PO LIQD
237.0000 mL | Freq: Two times a day (BID) | ORAL | Status: DC
  Administered 2023-10-27 – 2023-10-29 (×5): 237 mL via ORAL
  Filled 2023-10-26 (×7): qty 237

## 2023-10-26 MED ORDER — ENOXAPARIN SODIUM 40 MG/0.4ML IJ SOSY
40.0000 mg | PREFILLED_SYRINGE | INTRAMUSCULAR | Status: DC
  Filled 2023-10-26 (×3): qty 0.4

## 2023-10-26 MED ORDER — GABAPENTIN 300 MG PO CAPS
300.0000 mg | ORAL_CAPSULE | Freq: Three times a day (TID) | ORAL | Status: DC
  Administered 2023-10-26 – 2023-10-29 (×7): 300 mg via ORAL
  Filled 2023-10-26 (×13): qty 1

## 2023-10-26 MED ORDER — HYDRALAZINE HCL 20 MG/ML IJ SOLN: 10.0000 mg | INTRAMUSCULAR | Status: DC | PRN

## 2023-10-26 MED ORDER — NICOTINE POLACRILEX 2 MG MT GUM: 2.0000 mg | CHEWING_GUM | OROMUCOSAL | Status: DC | PRN

## 2023-10-26 MED ORDER — NICOTINE 21 MG/24HR TD PT24
21.0000 mg | MEDICATED_PATCH | Freq: Every day | TRANSDERMAL | Status: DC
  Administered 2023-10-27 – 2023-10-28 (×2): 21 mg via TRANSDERMAL
  Filled 2023-10-26 (×5): qty 1

## 2023-10-26 MED ORDER — PHENOBARBITAL 32.4 MG PO TABS: 32.4000 mg | ORAL_TABLET | Freq: Three times a day (TID) | ORAL | Status: DC

## 2023-10-26 MED ORDER — HALOPERIDOL LACTATE 5 MG/ML IJ SOLN: 5.0000 mg | Freq: Three times a day (TID) | INTRAMUSCULAR | Status: DC | PRN

## 2023-10-26 MED ORDER — HALOPERIDOL LACTATE 5 MG/ML IJ SOLN: 10.0000 mg | Freq: Three times a day (TID) | INTRAMUSCULAR | Status: DC | PRN

## 2023-10-26 MED ORDER — METOPROLOL TARTRATE 5 MG/5ML IV SOLN: 5.0000 mg | INTRAVENOUS | Status: DC | PRN

## 2023-10-26 NOTE — Progress Notes (Signed)
Patient is 53 yrs old, involuntary, first admission to [facility].  Stated he has been in several hospitals this yr but does not remember names of the hospital.  Stated he is divorced with one son 7 yrs old, which lives near him.  Has his own home in [place] and will return to his home.  Stated he has been on 100% disability.  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  High school education plus some tech college.  His bills are covered by disability check.  Had several surgeries in the past yrs, no problems now.  No problems with skin assessment.  Smokes about 2 packs cigarettes daily since 53 yrs old, wants nicotine patch.  Smokes medical THC from [place] for his PTSD and stress.  Recently drank 18-24 beers which is why he is in [facility] now.  Started drinking alcohol at age 75 yrs old.  Denied heroin, cocaine and other drug use.  During admission denied SI and HI, contracts for safety.   Denied A/V hallucinations.  Anxiety 12, depression 6, hopeless 3.  Denied physical, verbal, sexual abuse.  Feels very lonely, bored, nothing to work for.  Has  thought about volunteer work.  Wants to work on stress.  No advance directives.  Has traveled to [place] last week.  Dr. Willa Rough, [place] in [place], couple months ago.   Low fall risk.  Patient has been very cooperative and pleasant.  Patient oriented to hallway and given food/drink.

## 2023-10-26 NOTE — Progress Notes (Signed)
Patient has nicotine allergy per chart information.  Patient wants nicotine patch for 1-2 packs daily.

## 2023-10-26 NOTE — TOC Initial Note (Signed)
Transition of Care [facility]) - Initial/Assessment Note    Patient Details  Name: [name] MRN: [id number] Date of Birth: 02/04/1971  Transition of Care [facility]) CM/SW Contact:    Diona Browner, LCSW Phone Number: 10/26/2023, 11:23 AM  Clinical Narrative:                 Pt currently under IVC for SI/HI. Pt recommended for inpatient psychiatric placement. Currently no bed availability at [facility] or [facility]. Referrals have been faxed out for behavioral health psychiatric placement. Currently awaiting bed offers. TOC to follow.  Expected Discharge Plan: Psychiatric Hospital Barriers to Discharge: Psych Bed not available   Patient Goals and CMS Choice            Expected Discharge Plan and Services In-house Referral: Clinical Social Work   Post Acute Care Choice: NA                                        Prior Living Arrangements/Services   Lives with:: Significant Other Patient language and need for interpreter reviewed:: Yes Do you feel safe going back to the place where you live?: No   SI  Need for Family Participation in Patient Care: Yes (Comment) Care giver support system in place?: No (comment)   Criminal Activity/Legal Involvement Pertinent to Current Situation/Hospitalization: No - Comment as needed  Activities of Daily Living   ADL Screening (condition at time of admission) Independently performs ADLs?: Yes (appropriate for developmental age) Is the patient deaf or have difficulty hearing?: No Does the patient have difficulty seeing, even when wearing glasses/contacts?: No Does the patient have difficulty concentrating, remembering, or making decisions?: No  Permission Sought/Granted   Permission granted to share information with : No              Emotional Assessment   Attitude/Demeanor/Rapport: Unable to Assess Affect (typically observed): Unable to Assess Orientation: : Oriented to Self, Oriented to Place, Oriented to  Time, Oriented to  Situation Alcohol / Substance Use: Alcohol Use Psych Involvement: Yes (comment)  Admission diagnosis:  Alcohol withdrawal (HCC) [F10.939] Suicidal ideation [R45.851] Alcohol withdrawal syndrome with complication [facility]) [F10.939] Patient Active Problem List   Diagnosis Date Noted   Metabolic acidosis 10/22/2023   Alcohol use disorder, severe, in controlled environment (HCC) 03/25/2023   DTs (delirium tremens) (HCC) 03/25/2023   PTSD (post-traumatic stress disorder) 03/24/2023   Alcohol abuse with alcohol-induced mood disorder (HCC) 11/07/2018   MDD (major depressive disorder), recurrent severe, without psychosis (HCC) 02/17/2018   Alcohol use disorder, severe, dependence (HCC) 12/11/2017   Alcohol withdrawal (HCC) 12/10/2017   Seizure due to alcohol withdrawal (HCC) 08/30/2017   Alcohol withdrawal seizure (HCC) 08/30/2017   Tobacco abuse    Delirium tremens (HCC) 03/02/2017   GI bleed 10/06/2013   Marijuana abuse 10/06/2013   Suicidal ideation 10/05/2013   Alcohol abuse 10/26/2011   Depression with suicidal ideation 10/26/2011   PCP:  [facility] Pharmacy:   [facility] - [place], [place] - [address] [place] [address] Phone: [phone] Fax: [phone]  [facility] [address] [place] [address] Phone: [phone] Fax: [phone]  [facility] [address] [place] [address] Phone: [phone] Fax: [phone]     Social Drivers of Health (SDOH) Social History: SDOH Screenings  Food Insecurity: Food Insecurity Present (10/23/2023)  Housing: Unknown (10/23/2023)  Transportation Needs: Patient Declined (10/23/2023)  Utilities: Not At Risk (10/23/2023)  Alcohol Screen: Medium Risk (02/16/2018)  Social Connections: Unknown (01/13/2023)   Received from [facility], [facility]   Tobacco Use: High Risk (10/22/2023)   SDOH Interventions:     Readmission Risk Interventions    06/15/2023   12:01 PM 06/11/2023   12:22 PM  Readmission Risk Prevention Plan  Transportation Screening Complete Complete  PCP or Specialist Appt within 5-7 Days Complete Complete  Home Care Screening Complete Complete  Medication Review (RN CM) Complete Complete

## 2023-10-26 NOTE — [facility] Group Notes (Signed)
[facility] Group Notes:  (Nursing/MHT/Case Management/Adjunct)  Date:  10/26/2023  Time:  9:47 PM  Type of Therapy:  Psychoeducational Skills  Participation Level:  Did Not Attend  Participation Quality:  Resistant  Affect:  Resistant  Cognitive:  Lacking  Insight:  None  Engagement in Group:  None  Modes of Intervention:  Education  Summary of Progress/Problems: Patient did not attend the evening group.   Westly Pam 10/26/2023, 9:47 PM

## 2023-10-26 NOTE — Discharge Summary (Signed)
Physician Discharge Summary  Dennis GOVONI WUJ:[id number] DOB: 07-11-1971 DOA: 10/22/2023  PCP: [facility]  Admit date: 10/22/2023 Discharge date: 10/26/2023  Admitted From: Home Disposition:  [facility]  Recommendations for Outpatient Follow-up:  In-ptn Psych [facility]    Discharge Condition: Stable CODE STATUS: Full Diet recommendation: regular  Brief/Interim Summary: Brief Narrative:  53 year old with history of chronic alcohol use, recurrent admission due to withdrawals, PTSD comes to the ED with complaints of suicidal thoughts.  Patient is quite irritable and distressed.  Patient was IVC.D in the ED and psychiatry was consulted. Upon admission initially started on Librium taper thereafter transition to phenobarbital.  He has done well in terms of alcohol withdrawal.  Seen by psychiatry, currently IVC and pending transfer to inpatient psych.  Transferred to Inptn psych [facility] on 10/26/23  Assessment & Plan:  Principal Problem:   Alcohol abuse with alcohol-induced mood disorder (HCC) Active Problems:   Alcohol withdrawal (HCC)   PTSD (post-traumatic stress disorder)   Metabolic acidosis   Alcohol withdrawal History of chronic alcohol abuse Alcohol induced psychosis Sinus tachycardia-from alcoholdrawl No further showing signs of acute alcohol withdrawal.  On the tail end of his phenobarbital taper.  Gabapentin has been added by psychiatry.  History of PTSD Alcohol induced psychosis Active suicide thoughts Patient is currently involuntarily committed.  Psychiatry team is following.  Awaiting inpatient psych - Suicide and delirium protocol   Metabolic acidosis Resolved   History of hyperlipidemia - Not taking Lipitor anymore.   Pending inptn psych placement Medically stable.   DVT prophylaxis: Lovenox    Code Status: Full Code Family Communication:   Medically stable to transfer him to inpatient psych  Subjective: Doing well, keeps asking that he does not  want to be transferred to inpatient psych.  I explained to him that psychiatry team has to clear him before he can be let go otherwise IVC remains in place.   Examination:  Constitutional: Not in acute distress Respiratory: Clear to auscultation bilaterally Cardiovascular: Normal sinus rhythm, no rubs Abdomen: Nontender nondistended good bowel sounds Musculoskeletal: No edema noted Skin: No rashes seen Neurologic: CN 2-12 grossly intact.  And nonfocal Psychiatric: Very restless/frustrated and agitated and unpredictable mood.    Discharge Diagnoses:  Principal Problem:   Alcohol abuse with alcohol-induced mood disorder (HCC) Active Problems:   Alcohol withdrawal (HCC)   PTSD (post-traumatic stress disorder)   Metabolic acidosis      Discharge Exam: Vitals:   10/25/23 2037 10/26/23 0655  BP: 100/71 102/72  Pulse: 80 84  Resp:  18  Temp:  97.9 F (36.6 C)  SpO2:  97%   Vitals:   10/25/23 1448 10/25/23 2025 10/25/23 2037 10/26/23 0655  BP: 118/71 104/65 100/71 102/72  Pulse: 80 86 80 84  Resp: 19 18  18   Temp: 98.6 F (37 C) 98.7 F (37.1 C)  97.9 F (36.6 C)  TempSrc:      SpO2: 99% 96%  97%  Weight:      Height:          Discharge Instructions   Allergies as of 10/26/2023       Reactions   Effexor [venlafaxine] Hives   Paroxetine Hives   Nicoderm [nicotine] Rash   Paxil [paroxetine Hcl] Hives, Rash        Medication List     STOP taking these medications    nicotine 14 mg/24hr patch Commonly known as: NICODERM CQ - dosed in mg/24 hours   nicotine polacrilex 4 MG  gum Commonly known as: NICORETTE   rosuvastatin 5 MG tablet Commonly known as: CRESTOR   sildenafil 100 MG tablet Commonly known as: VIAGRA   traZODone 50 MG tablet Commonly known as: DESYREL       TAKE these medications    acetaminophen 500 MG tablet Commonly known as: TYLENOL Take 500 mg by mouth daily as needed for mild pain (pain score 1-3) or moderate pain (pain  score 4-6).   CertaVite/Antioxidants Tabs Take 1 tablet by mouth daily.   folic acid 1 MG tablet Commonly known as: FOLVITE Take 1 tablet (1 mg total) by mouth daily.   gabapentin 300 MG capsule Commonly known as: NEURONTIN Take 300 mg by mouth 3 (three) times daily.   thiamine 100 MG tablet Commonly known as: VITAMIN B1 Take 1 tablet (100 mg total) by mouth daily.        Allergies  Allergen Reactions   Effexor [Venlafaxine] Hives   Paroxetine Hives   Nicoderm [Nicotine] Rash   Paxil [Paroxetine Hcl] Hives and Rash    You were cared for by a hospitalist during your hospital stay. If you have any questions about your discharge medications or the care you received while you were in the hospital after you are discharged, you can call the unit and asked to speak with the hospitalist on call if the hospitalist that took care of you is not available. Once you are discharged, your primary care physician will handle any further medical issues. Please note that no refills for any discharge medications will be authorized once you are discharged, as it is imperative that you return to your primary care physician (or establish a relationship with a primary care physician if you do not have one) for your aftercare needs so that they can reassess your need for medications and monitor your lab values.  You were cared for by a hospitalist during your hospital stay. If you have any questions about your discharge medications or the care you received while you were in the hospital after you are discharged, you can call the unit and asked to speak with the hospitalist on call if the hospitalist that took care of you is not available. Once you are discharged, your primary care physician will handle any further medical issues. Please note that NO REFILLS for any discharge medications will be authorized once you are discharged, as it is imperative that you return to your primary care physician (or establish  a relationship with a primary care physician if you do not have one) for your aftercare needs so that they can reassess your need for medications and monitor your lab values.  Please request your Prim.MD to go over all Hospital Tests and Procedure/Radiological results at the follow up, please get all Hospital records sent to your Prim MD by signing hospital release before you go home.  Get CBC, CMP, 2 view Chest X ray checked  by Primary MD during your next visit or SNF MD in 5-7 days ( we routinely change or add medications that can affect your baseline labs and fluid status, therefore we recommend that you get the mentioned basic workup next visit with your PCP, your PCP may decide not to get them or add new tests based on their clinical decision)  On your next visit with your primary care physician please Get Medicines reviewed and adjusted.  If you experience worsening of your admission symptoms, develop shortness of breath, life threatening emergency, suicidal or homicidal thoughts you must seek  medical attention immediately by calling 911 or calling your MD immediately  if symptoms less severe.  You Must read complete instructions/literature along with all the possible adverse reactions/side effects for all the Medicines you take and that have been prescribed to you. Take any new Medicines after you have completely understood and accpet all the possible adverse reactions/side effects.   Do not drive, operate heavy machinery, perform activities at heights, swimming or participation in water activities or provide baby sitting services if your were admitted for syncope or siezures until you have seen by Primary MD or a Neurologist and advised to do so again.  Do not drive when taking Pain medications.   Procedures/Studies: No results found.   The results of significant diagnostics from this hospitalization (including imaging, microbiology, ancillary and laboratory) are listed below for  reference.     Microbiology: No results found for this or any previous visit (from the past 240 hours).   Labs: BNP (last 3 results) No results for input(s): "BNP" in the last 8760 hours. Basic Metabolic Panel: Recent Labs  Lab 10/22/23 1007 10/23/23 0538 10/24/23 0713 10/25/23 0555 10/26/23 0435  NA 142 139 139 134* 140  K 3.8 3.7 3.6 3.6 3.6  CL 108 107 106 105 105  CO2 19* 23 28 24 24   GLUCOSE 89 79 93 93 87  BUN 8 16 9 10 13   CREATININE 0.64 0.72 0.68 0.68 0.56*  CALCIUM 9.1 8.9 9.3 8.9 8.9  MG  --   --  2.2 2.1 2.2   Liver Function Tests: Recent Labs  Lab 10/22/23 1007 10/23/23 0538 10/24/23 0713 10/25/23 0555 10/26/23 0435  AST 35 35 56* 37 26  ALT 26 24 41 34 28  ALKPHOS 56 48 51 46 44  BILITOT 0.7 1.6* 1.3* 1.1 0.7  PROT 8.2* 6.4* 7.0 6.7 6.8  ALBUMIN 4.7 3.7 4.2 3.7 3.8   No results for input(s): "LIPASE", "AMYLASE" in the last 168 hours. No results for input(s): "AMMONIA" in the last 168 hours. CBC: Recent Labs  Lab 10/22/23 1007 10/23/23 0538 10/24/23 0713 10/25/23 0555 10/26/23 0435  WBC 7.8 6.6 5.4 5.1 5.3  NEUTROABS 4.2  --   --   --   --   HGB 17.0 14.5 15.1 15.2 15.3  HCT 51.8 43.6 44.7 45.9 46.6  MCV 79.7* 80.3 80.0 81.0 81.5  PLT 276 218 211 206 199   Cardiac Enzymes: No results for input(s): "CKTOTAL", "CKMB", "CKMBINDEX", "TROPONINI" in the last 168 hours. BNP: Invalid input(s): "POCBNP" CBG: No results for input(s): "GLUCAP" in the last 168 hours. D-Dimer No results for input(s): "DDIMER" in the last 72 hours. Hgb A1c No results for input(s): "HGBA1C" in the last 72 hours. Lipid Profile No results for input(s): "CHOL", "HDL", "LDLCALC", "TRIG", "CHOLHDL", "LDLDIRECT" in the last 72 hours. Thyroid function studies No results for input(s): "TSH", "T4TOTAL", "T3FREE", "THYROIDAB" in the last 72 hours.  Invalid input(s): "FREET3" Anemia work up Recent Labs    10/24/23 0713  FOLATE 12.3   Urinalysis    Component Value  Date/Time   COLORURINE STRAW (A) 03/24/2023 0836   APPEARANCEUR CLEAR 03/24/2023 0836   LABSPEC 1.004 (L) 03/24/2023 0836   PHURINE 5.0 03/24/2023 0836   GLUCOSEU NEGATIVE 03/24/2023 0836   HGBUR NEGATIVE 03/24/2023 0836   BILIRUBINUR NEGATIVE 03/24/2023 0836   KETONESUR NEGATIVE 03/24/2023 0836   PROTEINUR NEGATIVE 03/24/2023 0836   NITRITE NEGATIVE 03/24/2023 0836   LEUKOCYTESUR NEGATIVE 03/24/2023 0836   Sepsis Labs Recent  Labs  Lab 10/23/23 0538 10/24/23 0713 10/25/23 0555 10/26/23 0435  WBC 6.6 5.4 5.1 5.3   Microbiology No results found for this or any previous visit (from the past 240 hours).   Time coordinating discharge:  I have spent 35 minutes face to face with the patient and on the ward discussing the patients care, assessment, plan and disposition with other care givers. >50% of the time was devoted counseling the patient about the risks and benefits of treatment/Discharge disposition and coordinating care.   SIGNED:   Miguel Rota, MD  [facility] 10/26/2023, 3:23 PM   If 7PM-7AM, please contact night-coverage

## 2023-10-26 NOTE — Tx Team (Signed)
Initial Treatment Plan 10/26/2023 6:25 PM JOVE HANNINEN ZOX:[id number]    PATIENT STRESSORS: Health problems   Medication change or noncompliance   Substance abuse     PATIENT STRENGTHS: Ability for insight  Active sense of humor  Average or above average intelligence  Capable of independent living  Communication skills  Financial means  General fund of knowledge  Motivation for treatment/growth  Physical Health  Supportive family/friends  Work skills    PATIENT IDENTIFIED PROBLEMS: "Stress"  "Alcohol"  "Loneliness"  "Depression"               DISCHARGE CRITERIA:  Ability to meet basic life and health needs Adequate post-discharge living arrangements Improved stabilization in mood, thinking, and/or behavior Medical problems require only outpatient monitoring Motivation to continue treatment in a less acute level of care Need for constant or close observation no longer present Reduction of life-threatening or endangering symptoms to within safe limits Safe-care adequate arrangements made Verbal commitment to aftercare and medication compliance Withdrawal symptoms are absent or subacute and managed without 24-hour nursing intervention  PRELIMINARY DISCHARGE PLAN: Attend aftercare/continuing care group Attend PHP/IOP Attend 12-step recovery group Outpatient therapy Return to previous living arrangement  PATIENT/FAMILY INVOLVEMENT: This treatment plan has been presented to and reviewed with the patient, MIHCAEL BOUCHIE..  The patient and family have been given the opportunity to ask questions and make suggestions.  Quintella Reichert [place], RN 10/26/2023, 6:25 PM

## 2023-10-26 NOTE — Progress Notes (Signed)
PROGRESS NOTE    [name]  WUJ:[id number] DOB: 06-15-1971 DOA: 10/22/2023 PCP: [facility]    Brief Narrative:  53 year old with history of chronic alcohol use, recurrent admission due to withdrawals, PTSD comes to the ED with complaints of suicidal thoughts.  Patient is quite irritable and distressed.  Patient was IVC.D in the ED and psychiatry was consulted. Upon admission initially started on Librium taper thereafter transition to phenobarbital.  He has done well in terms of alcohol withdrawal.  Seen by psychiatry, currently IVC and pending transfer to inpatient psych.  Assessment & Plan:  Principal Problem:   Alcohol abuse with alcohol-induced mood disorder (HCC) Active Problems:   Alcohol withdrawal (HCC)   PTSD (post-traumatic stress disorder)   Metabolic acidosis   Alcohol withdrawal History of chronic alcohol abuse Alcohol induced psychosis Sinus tachycardia-from alcoholdrawl No further showing signs of acute alcohol withdrawal.  On the tail end of his phenobarbital taper.  Gabapentin has been added by psychiatry.  History of PTSD Alcohol induced psychosis Active suicide thoughts Patient is currently involuntarily committed.  Psychiatry team is following.  Awaiting inpatient psych - Suicide and delirium protocol   Metabolic acidosis Resolved   History of hyperlipidemia - Not taking Lipitor anymore.   Pending inptn psych placement Medically stable.   DVT prophylaxis: Lovenox    Code Status: Full Code Family Communication:   Medically stable to transfer him to inpatient psych  Subjective: Doing well, keeps asking that he does not want to be transferred to inpatient psych.  I explained to him that psychiatry team has to clear him before he can be let go otherwise IVC remains in place.   Examination:  Constitutional: Not in acute distress Respiratory: Clear to auscultation bilaterally Cardiovascular: Normal sinus rhythm, no rubs Abdomen:  Nontender nondistended good bowel sounds Musculoskeletal: No edema noted Skin: No rashes seen Neurologic: CN 2-12 grossly intact.  And nonfocal Psychiatric: Very restless/frustrated and agitated and unpredictable mood.                Diet Orders (From admission, onward)     Start     Ordered   10/22/23 1955  Diet Heart Room service appropriate? Yes; Fluid consistency: Thin  Diet effective now       Comments: Harm prevention tray.  Patient had previous suicidal thoughts.  Question Answer Comment  Room service appropriate? Yes   Fluid consistency: Thin      10/22/23 1954            Objective: Vitals:   10/25/23 1448 10/25/23 2025 10/25/23 2037 10/26/23 0655  BP: 118/71 104/65 100/71 102/72  Pulse: 80 86 80 84  Resp: 19 18  18   Temp: 98.6 F (37 C) 98.7 F (37.1 C)  97.9 F (36.6 C)  TempSrc:      SpO2: 99% 96%  97%  Weight:      Height:        Intake/Output Summary (Last 24 hours) at 10/26/2023 1218 Last data filed at 10/26/2023 9562 Gross per 24 hour  Intake 1200 ml  Output 0 ml  Net 1200 ml   Filed Weights   10/22/23 0915  Weight: 68 kg    Scheduled Meds:  enoxaparin (LOVENOX) injection  40 mg Subcutaneous Q24H   gabapentin  300 mg Oral TID   LORazepam  0-4 mg Intravenous Q12H   Or   LORazepam  0-4 mg Oral Q12H   nicotine  21 mg Transdermal Daily   phenobarbital  32.4  mg Oral Q8H   sodium chloride flush  3 mL Intravenous Q12H   thiamine  100 mg Oral Daily   Or   thiamine  100 mg Intravenous Daily   Continuous Infusions:  Nutritional status     Body mass index is 21.51 kg/m.  Data Reviewed:   CBC: Recent Labs  Lab 10/22/23 1007 10/23/23 0538 10/24/23 0713 10/25/23 0555 10/26/23 0435  WBC 7.8 6.6 5.4 5.1 5.3  NEUTROABS 4.2  --   --   --   --   HGB 17.0 14.5 15.1 15.2 15.3  HCT 51.8 43.6 44.7 45.9 46.6  MCV 79.7* 80.3 80.0 81.0 81.5  PLT 276 218 211 206 199   Basic Metabolic Panel: Recent Labs  Lab 10/22/23 1007  10/23/23 0538 10/24/23 0713 10/25/23 0555 10/26/23 0435  NA 142 139 139 134* 140  K 3.8 3.7 3.6 3.6 3.6  CL 108 107 106 105 105  CO2 19* 23 28 24 24   GLUCOSE 89 79 93 93 87  BUN 8 16 9 10 13   CREATININE 0.64 0.72 0.68 0.68 0.56*  CALCIUM 9.1 8.9 9.3 8.9 8.9  MG  --   --  2.2 2.1 2.2   GFR: Estimated Creatinine Clearance: 105.1 mL/min (A) (by C-G formula based on SCr of 0.56 mg/dL (L)). Liver Function Tests: Recent Labs  Lab 10/22/23 1007 10/23/23 0538 10/24/23 0713 10/25/23 0555 10/26/23 0435  AST 35 35 56* 37 26  ALT 26 24 41 34 28  ALKPHOS 56 48 51 46 44  BILITOT 0.7 1.6* 1.3* 1.1 0.7  PROT 8.2* 6.4* 7.0 6.7 6.8  ALBUMIN 4.7 3.7 4.2 3.7 3.8   No results for input(s): "LIPASE", "AMYLASE" in the last 168 hours. No results for input(s): "AMMONIA" in the last 168 hours. Coagulation Profile: No results for input(s): "INR", "PROTIME" in the last 168 hours. Cardiac Enzymes: No results for input(s): "CKTOTAL", "CKMB", "CKMBINDEX", "TROPONINI" in the last 168 hours. BNP (last 3 results) No results for input(s): "PROBNP" in the last 8760 hours. HbA1C: No results for input(s): "HGBA1C" in the last 72 hours. CBG: No results for input(s): "GLUCAP" in the last 168 hours. Lipid Profile: No results for input(s): "CHOL", "HDL", "LDLCALC", "TRIG", "CHOLHDL", "LDLDIRECT" in the last 72 hours. Thyroid Function Tests: No results for input(s): "TSH", "T4TOTAL", "FREET4", "T3FREE", "THYROIDAB" in the last 72 hours. Anemia Panel: Recent Labs    10/24/23 0713  FOLATE 12.3   Sepsis Labs: No results for input(s): "PROCALCITON", "LATICACIDVEN" in the last 168 hours.  No results found for this or any previous visit (from the past 240 hours).       Radiology Studies: No results found.         LOS: 3 days   Time spent= 35 mins    Miguel Rota, MD [facility]  If 7PM-7AM, please contact night-coverage  10/26/2023, 12:18 PM

## 2023-10-26 NOTE — TOC Transition Note (Signed)
Transition of Care [facility]) - Discharge Note   Patient Details  Name: [name] MRN: [id number] Date of Birth: [birth year]  Transition [facility]) CM/SW Contact:  Otelia Santee, LCSW Phone Number: 10/26/2023, 12:43 PM   Clinical Narrative:    Pt accepted to [facility]. Pt will be going to room 403-1. RN to call report to [phone]. DC packet made with 3 copies of IVC. Law enforcement to transport pt to facility as pt is under IVC. [employer] called at 1:35pm.   Final next level of care: Psychiatric Hospital Barriers to Discharge: Barriers Resolved   Patient Goals and CMS Choice            Discharge Placement                       Discharge Plan and Services Additional resources added to the After Visit Summary for   In-house Referral: Clinical Social Work   Post Acute Care Choice: NA                               Social Drivers of Health (SDOH) Interventions SDOH Screenings   Food Insecurity: Food Insecurity Present (10/23/2023)  Housing: Unknown (10/23/2023)  Transportation Needs: Patient Declined (10/23/2023)  Utilities: Not At Risk (10/23/2023)  Alcohol Screen: Medium Risk (02/16/2018)  Social Connections: Unknown (01/13/2023)   Received from [facility], [facility]  Tobacco Use: High Risk (10/22/2023)     Readmission Risk Interventions    10/26/2023   12:43 PM 06/15/2023   12:01 PM 06/11/2023   12:22 PM  Readmission Risk Prevention Plan  Transportation Screening Complete Complete Complete  PCP or Specialist Appt within 5-7 Days Complete Complete Complete  Home Care Screening Complete Complete Complete  Medication Review (RN CM) Complete Complete Complete

## 2023-10-26 NOTE — Progress Notes (Signed)
This nurse called in report to [facility] RN Rozell Searing. Patient escorted to [facility] by [employer].

## 2023-10-26 NOTE — Consult Note (Signed)
Provider saw patient face to face to discuss inpatient Psychiatry hospitalization for alcohol use and suicide ideation.  Patient became angry speaking loud stating the suicidal gesture was due to him being intoxicated.  Patient became very angry about being hospitalized due to expensive bill.  Patient was educated on the benefit of inpatient Psychiatry hospitalization and safety and stabilization.  He remains adamant that he does not need inpatient Psychiatry hospitalization.  Patient was informed that he is under IVC and that warrants going to the [facility] to continue treatment.

## 2023-10-27 DIAGNOSIS — F4323 Adjustment disorder with mixed anxiety and depressed mood: Principal | ICD-10-CM | POA: Diagnosis present

## 2023-10-27 MED ORDER — HYDROXYZINE HCL 25 MG PO TABS
25.0000 mg | ORAL_TABLET | Freq: Three times a day (TID) | ORAL | Status: DC | PRN
  Administered 2023-10-28 – 2023-10-29 (×4): 25 mg via ORAL
  Filled 2023-10-27 (×4): qty 1

## 2023-10-27 MED ORDER — LORAZEPAM 1 MG PO TABS: 1.0000 mg | ORAL_TABLET | Freq: Four times a day (QID) | ORAL | Status: DC | PRN

## 2023-10-27 MED ORDER — LORAZEPAM 2 MG/ML IJ SOLN
1.0000 mg | Freq: Four times a day (QID) | INTRAMUSCULAR | Status: DC | PRN
Start: 2023-10-27 — End: 2023-11-02

## 2023-10-27 MED ORDER — TRAZODONE HCL 50 MG PO TABS
50.0000 mg | ORAL_TABLET | Freq: Every evening | ORAL | Status: DC | PRN
  Administered 2023-10-27 – 2023-10-28 (×2): 50 mg via ORAL
  Filled 2023-10-27 (×2): qty 1

## 2023-10-27 NOTE — Group Note (Signed)
LCSW Group Therapy Note   Group Date: 10/27/2023 Start Time: 1100 End Time: 1200   Type of Therapy:  Group Therapy  Topic:  Shining from Within:  Confidence and Self-Love Journey  Participation:  patient was present and actively participated in the discussion    Objective: Promote Self-Awareness and Realistic Self-Talk: Help participants recognize their strengths and replace negative thoughts with truthful, realistic statements to build confidence.  Goals: Increase Confidence: Help participants develop a positive self-image by focusing on their strengths and personal progress. Set Achievable Goals: Guide participants in creating small, realistic goals that foster a sense of accomplishment and build momentum. Enhance Self-Care Practices: Encourage participants to incorporate self-care activities into their routine to support emotional well-being and reinforce confidence.  Summary: The "Shining from Within: Confidence and Self-Love Journey" group helped individuals build stronger self-confidence through truthful, realistic self-talk, goal-setting, and self-care practices. Participants recognized their unique strengths, set achievable goals, and nurtured a supportive environment for mutual growth. The group provided practical tools to foster lasting confidence and self-belief, empowering each member to shine from within and embrace their personal power with greater self-assurance.  Therapeutic Modalities: Elements of CBT Elements of DBT   Alla Feeling, LCSWA 10/27/2023  6:28 PM

## 2023-10-27 NOTE — [facility] Suicide Risk Assessment (Signed)
Suicide Risk Assessment  Admission Assessment    [facility] Admission Suicide Risk Assessment   Nursing information obtained from:  Patient Demographic factors:  Male, Living alone, Unemployed, Divorced or widowed, Caucasian Current Mental Status:  NA Loss Factors:  NA Historical Factors:  NA Risk Reduction Factors:  Positive social support, Sense of responsibility to family  Total Time spent with patient: 1 hour Principal Problem: Adjustment disorder with mixed anxiety and depressed mood Diagnosis:  Principal Problem:   Adjustment disorder with mixed anxiety and depressed mood Active Problems:   Alcohol use disorder, severe, dependence (HCC)  [name] is a 53 y.o. male  with a past psychiatric history of PTSD, alcohol use disorder, and numerous hospitalizations for alcohol withdrawal and substance rehab. Patient initially arrived to [facility] on 10/22/23 for alcohol intoxication, and admitted to [facility] under IVC on 10/26/23 for acute safety concerns.    Collateral Information, Gregary Signs, son, [phone]:  provided consent to gather information and provide information to his son and provided this phone number. Reports no concerns about his ending his life or suicidal thoughts or attempts. Reports taht he does drink a lot but has never had suicidal thoughts or severe depression. Reports taht his PSD affects him a lot but no safet yconcerns. Son reports that he seems him daily. No firearms at home per son. Reports that he is unsure if his ex-partner is out of his life but son also stays out of that information. Reports that his dad sees a Warden/ranger through [place]. Asked about options for treating PTSD in the area, and I recommended that he talked with his [facility] case worker since they have best acess to resources for him including med managent, trauma focused therapy, EMDR, etc.    HPI:  Patient reports that he was brought to the hospital after returning to alcohol use and when his girlfriend found him and  became upset that he was drinking again.  Patient reports that he has been dealing with alcohol use disorder for a lot of his life largely as a way to cope with his PTSD.  He reports that he is in and out of the hospital and in and out of rehab programs.  He reports that lately he had been doing well with regard to his drinking.  However, he went on a trip to [place] with his girlfriend and during this trip he reported that she was upset and crying consistently because it was cold.  Patient reports that he has been having a lot of relationship issues with girlfriend because of her "codependency issues."  Reports that he became upset by the amount of crying and complaining she was doing and this led to him breaking up with her and returning to alcohol use after return from the trip.  He reports that he had gotten pretty intoxicated and she showed up to the house, despite being broken up with, and that she called the cops on him to have him brought to the hospital, against his will.  While in the hospital, he was refusing medications and security had to be called to the room due to agitation.  While secretes room and he was upset at the situation, he told the security officer to "just shoot me."  Patient reports that he said this not because he was suicidal but because he felt like he was upset the way that he is being treated he would have rather just not been on this earth if this is how life is going  to be.  He reports that it was just an impulsive statement that he said and he understands why he was brought to the behavioral hospital due to that comment but authentically does not feel that he is depressed or has had any suicidal thoughts currently or any time in his life.  He insists on this to the point that he recommended calling his son who knows him very well and reports that he thinks his son would be helpful for clarifying his lack of suicidality.  He does report that he has a longstanding history of PTSD  and that he has ongoing support with the [facility] to help with this.  He reports that he has significant nightmares.  Patient adamantly reports during interview that he does not want to be started on any medications.  Regarding alcohol withdrawal he endorses visual disturbances (reports that objects are moving a little bit) but denies tremors, nausea, vomiting, diarrhea, sweats, anxiety, insomnia, and is oriented to person place and time.  Psychiatric ROS Depression Symptoms Patient denies depressed mood, anhedonia, and suicidal thoughts.  Reports minimal burden of these symptoms in the past.  Denies any past suicidal thoughts, suicide attempts, or self-harm.   Manic Symptoms Patient denies current or lifetime mania symptoms.   Anxiety Symptoms Patient endorses occasional anxiety but on further discussion anxiety symptoms patient's "anxiety" is secondary to trauma.   Trauma Symptoms Patient has longstanding PTSD so did not discuss full diagnostic criteria patient given he already met criteria in the past and continues to have significant issues stemming from this disorder.  He does report he has significant nightmares.  He discusses "anxiety" consistent with pathologic hyperarousal symptoms in PTSD.  He reports that he has a Warden/ranger in the [place] for therapy for his PTSD.  Reports that cannabis helps with nightmares.  Reports has not been on medications for PTSD.   Psychosis Symptoms Denies current or lifetime hallucinations or paranoia.  Continued Clinical Symptoms:  Alcohol Use Disorder Identification Test Final Score (AUDIT): 23 The "Alcohol Use Disorders Identification Test", Guidelines for Use in Primary Care, Second Edition.  World Science writer [facility]). Score between 0-7:  no or low risk or alcohol related problems. Score between 8-15:  moderate risk of alcohol related problems. Score between 16-19:  high risk of alcohol related problems. Score 20 or above:  warrants further diagnostic  evaluation for alcohol dependence and treatment.   CLINICAL FACTORS:   Alcohol/Substance Abuse/Dependencies   Musculoskeletal: Strength & Muscle Tone: within normal limits Gait & Station: normal Patient leans: N/A  Psychiatric Specialty Exam:  Presentation  General Appearance:  Appropriate for Environment  Eye Contact: Good  Speech: Normal Rate  Speech Volume: Normal  Handedness: Right   Mood and Affect  Mood: Euthymic  Affect: Appropriate; Congruent; Full Range   Thought Process  Thought Processes: Coherent; Linear  Descriptions of Associations:Intact  Orientation:Full (Time, Place and Person)  Thought Content:Logical  History of Schizophrenia/Schizoaffective disorder:No data recorded Duration of Psychotic Symptoms:No data recorded Hallucinations:Hallucinations: None  Ideas of Reference:None  Suicidal Thoughts:Suicidal Thoughts: No  Homicidal Thoughts:Homicidal Thoughts: No   Sensorium  Memory: Immediate Good; Recent Good; Remote Good  Judgment: Fair  Insight: Good   Executive Functions  Concentration: Good  Attention Span: Good  Recall: Good  Fund of Knowledge: Good  Language: Good   Psychomotor Activity  Psychomotor Activity: Psychomotor Activity: Normal   Assets  Assets: Communication Skills; Desire for Improvement; Financial Resources/Insurance; Housing; Resilience; Social Support   Sleep  Sleep: Sleep:  Good (Sleeps well except for nightmares but can go back to sleep after.) Number of Hours of Sleep: 7    Physical Exam: Physical Exam Vitals and nursing note reviewed.  Constitutional:      General: He is not in acute distress.    Appearance: He is not ill-appearing or diaphoretic.  HENT:     Head: Normocephalic and atraumatic.  Pulmonary:     Effort: Pulmonary effort is normal.  Neurological:     General: No focal deficit present.     Mental Status: He is alert and oriented to person, place, and  time.    Review of Systems  Constitutional:  Negative for diaphoresis and fever.  Cardiovascular:  Negative for chest pain and palpitations.  Gastrointestinal:  Negative for constipation, diarrhea, nausea and vomiting.  Neurological:  Negative for dizziness, tremors, weakness and headaches.  Psychiatric/Behavioral:  Negative for suicidal ideas. The patient is not nervous/anxious and does not have insomnia.        Endorses visual disturbances   Blood pressure 124/82, pulse 74, temperature 97.9 F (36.6 C), temperature source Oral, resp. rate 18, height 5\' 10"  (1.778 m), weight 64.9 kg, SpO2 98%. Body mass index is 20.52 kg/m.   COGNITIVE FEATURES THAT CONTRIBUTE TO RISK:  None    SUICIDE RISK:   Moderate:  Suicidal ideation with limited intensity, and short duration (no longer experiencing), some specificity in terms of plans, no associated intent, good self-control, limited dysphoria/symptomatology, some risk factors present, and identifiable protective factors, including available and accessible social support. Significant distress from PTSD and impulsivity from alcohol use.   Treatment Plan Summary: Daily contact with patient to assess and evaluate symptoms and progress in treatment and Medication management     ASSESSMENT & PLAN   ASSESSMENT:   Diagnoses / Active Problems: Principal Problem:   Adjustment disorder with mixed anxiety and depressed mood Active Problems:   Alcohol use disorder, severe, dependence (HCC)     [name] is a 53 y.o. male with past history of PTSD and alcohol use disorder who was admitted to [facility] after telling security to "just shoot me" when he was upset.  Patient has no history of MDD, suicidal thoughts, or suicide attempts and currently denies symptoms of depression including depressed mood and anhedonia.  Collateral from son confirms this.  Patient has severe PTSD and severe alcohol use and his in the hospital monthly.  Discussed starting  medication to patient and he is adamantly against any meds including for PTSD, nightmares, or alcohol cravings.  Discussed doing residential rehab and patient reports that he is having his [facility] case worker look into that now [place].  Patient reports that he has a therapist in [place] system.  Regarding alcohol withdrawal, spoke to hospitalist who agreed that patient does not need anymore doses of phenobarbital if not having severe symptoms.  On exam patient has minimal alcohol withdrawal symptoms and CIWA was by nursing over the last day have also been minimal.  Plan to observe patient and discharge on likely Thursday/Friday if no concerns.   PLAN: Safety and Monitoring:             -- Involuntary admission to inpatient psychiatric unit for safety, stabilization and treatment             -- Daily contact with patient to assess and evaluate symptoms and progress in treatment             -- Patient's case to be  discussed in multi-disciplinary team meeting             -- Observation Level : q15 minute checks             -- Vital signs:  q12 hours             -- Precautions: suicide, elopement, and assault   2. Psychiatric Diagnoses and Treatment:  -- If patient is agreeable to medications in future, would consider Prozac 20 mg once daily for PTSD or prazosin for nightmares.             -- Continue trazodone 50 mg once nightly as needed for insomnia             -- Continue hydroxyzine 25 mg 3 times daily as needed for anxiety   --  The risks/benefits/side-effects/alternatives to this medication were discussed in detail with the patient and time was given for questions. The patient consents to medication trial.              -- Metabolic profile and EKG monitoring obtained while on an atypical antipsychotic. See #4 below for values.              -- Encouraged patient to participate in unit milieu and in scheduled group therapies              -- Short Term Goals: Ability to identify changes in  lifestyle to reduce recurrence of condition will improve, Ability to verbalize feelings will improve, Ability to disclose and discuss suicidal ideas, Ability to demonstrate self-control will improve, Ability to identify and develop effective coping behaviors will improve, Ability to maintain clinical measurements within normal limits will improve, Compliance with prescribed medications will improve, and Ability to identify triggers associated with substance abuse/mental health issues will improve             -- Long Term Goals: Improvement in symptoms so as ready for discharge                3. Medical Issues Being Addressed:              -- Alcohol withdrawal syndrome: CIWA with Ativan as needed               -- Continue PRN's: Tylenol, Maalox, Milk of Magnesia     4. Routine and other pertinent labs reviewed: EKG monitoring: QTc: 427   Metabolism / endocrine: BMI: Body mass index is 20.52 kg/m. Prolactin: Recent Labs  No results found for: "PROLACTIN"   Lipid Panel: Recent Labs        Lab Results  Component Value Date    CHOL 225 (H) 10/14/2013    TRIG 112 10/14/2013    HDL 62 10/14/2013    CHOLHDL 3.6 10/14/2013    VLDL 22 10/14/2013    LDLCALC 141 (H) 10/14/2013    LDLCALC (H) 12/24/2010      156        Total Cholesterol/HDL:CHD Risk Coronary Heart Disease Risk Table                     Men   Women  1/2 Average Risk   3.4   3.3  Average Risk       5.0   4.4  2 X Average Risk   9.6   7.1  3 X Average Risk  23.4   11.0        Use the calculated Patient Ratio above and  the CHD Risk Table to determine the patient's CHD Risk.        ATP III CLASSIFICATION (LDL):  <100     mg/dL   Optimal  657-846  mg/dL   Near or Above                    Optimal  130-159  mg/dL   Borderline  962-952  mg/dL   High  >841     mg/dL   Very High      [id number]: Last Labs  No results found for: "HGBA1C"   TSH: Last Labs     TSH (uIU/mL)  Date Value  10/23/2023 0.783  10/14/2013  0.991        Labs to order: None currently   5. Discharge Planning:              -- Social work and case management to assist with discharge planning and identification of hospital follow-up needs prior to discharge             -- Estimated LOS: 7-10 days, but would consider discharge on Thursday/Friday if continues to be stable             -- Discharge Concerns: Need to establish a safety plan; Medication compliance and effectiveness             -- Discharge Goals: Return home with outpatient referrals for mental health follow-up including medication management/psychotherapy     I certify that inpatient services furnished can reasonably be expected to improve the patient's condition.   This note was created using a voice recognition software as a result there may be grammatical errors inadvertently enclosed that do not reflect the nature of this encounter. Every attempt is made to correct such errors.   I certify that inpatient services furnished can reasonably be expected to improve the patient's condition.   Meryl Dare, MD PGY-1 Psychiatry Resident 10/27/2023, 2:18 PM

## 2023-10-27 NOTE — [facility] Counselor (Signed)
Adult Comprehensive Assessment  Patient ID: [name], male   DOB: 1971-01-20, 53 y.o.   MRN: [id number]  Information Source: Information source: Patient  Current Stressors:  Patient states their primary concerns and needs for treatment are:: "I was drinking at my house and I got drunk, my gf doesn't like it when I drink, she brought me to the hosital and said I was suicidal so they IVC me" Patient states their goals for this hospitilization and ongoing recovery are:: "I don't need to be here" Educational / Learning stressors: None reported Employment / Job issues: None reported Family Relationships: None reported Surveyor, quantity / Lack of resources (include bankruptcy): None reported Housing / Lack of housing: None reported Physical health (include injuries & life threatening diseases): None reported Social relationships: None reported Substance abuse: "I get in drinking episodes but it isn't a problem" Bereavement / Loss: None reported  Living/Environment/Situation:  Living Arrangements: Alone Living conditions (as described by patient or guardian): House Who else lives in the home?: Alone How long has patient lived in current situation?: 15 years What is atmosphere in current home: Comfortable, Supportive  Family History:  Marital status: Divorced Divorced, when?: May 2018 What types of issues is patient dealing with in the relationship?: "We were married for 26 years" Additional relationship information: Arguments with current girlfriend about drinking Are you sexually active?: Yes What is your sexual orientation?: heterosexual Has your sexual activity been affected by drugs, alcohol, medication, or emotional stress?: No Does patient have children?: Yes How many children?: 1 How is patient's relationship with their children?: "That's my boy, we are best friends"  Childhood History:  By whom was/is the patient raised?: Other (Comment) Additional childhood history  information: Pt lived with extended family members due to financial stress on mom.  Father died when pt was 6.  He was in [place] before this. Description of patient's relationship with caregiver when they were a child: mom: good relationship, but she was argumentative.  Good relationship with aunt and uncle.  Dad: very little contact. Patient's description of current relationship with people who raised him/her: Aunt passed away, mom: "I get a long with her great, she stopped drinking," Uncle: "He is always there for me but he is getting older" How were you disciplined when you got in trouble as a child/adolescent?: appropriate discipline Does patient have siblings?: Yes Number of Siblings: 1 Description of patient's current relationship with siblings: one younger brother.  Good relationship.  He lives in [place]. 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?: Yes (Describe in comment) ([employer]y: [phone], full  disability veteran) Did You Receive Any Psychiatric Treatment/Services While in the [employer]?: No  Financial Resources:   Surveyor, quantity resources: Media planner (Veteran 100% disablity, employment funds go into company) Does patient have a Lawyer or guardian?: No  Alcohol/Substance Abuse:   What has been your use of drugs/alcohol within the last 12 months?: "Weed (has legal card for PTSD in [place]) and alcohol" If attempted suicide, did drugs/alcohol play a role in this?: Yes Alcohol/Substance Abuse Treatment Hx: Past Tx, Inpatient If yes, describe treatment: [facility], [facility], [facility], [facility], "I have been to all of them" Has alcohol/substance abuse ever caused legal problems?: No  Social Support System:   Conservation officer, nature Support System: Fair Museum/gallery exhibitions officer System: Son, strong family support Type of faith/religion: Roman Catholic How does patient's faith help to cope with current illness?: "I woud like to have my Rosary Beads"  Leisure/Recreation:   Do You Have Hobbies?: Yes Leisure and Hobbies: golf, water ski  Strengths/Needs:   What is the patient's perception of their strengths?: "I never give up, I keep on fighting and never surrender" Patient states they can use these personal strengths during their treatment to contribute to their recovery: "I keep going" Patient states these barriers may affect/interfere with their treatment: None reported Patient states these barriers may affect their return to the community: None reported Other important information patient would like considered in planning for their treatment: None reported  Discharge Plan:   Currently receiving community mental health services: Yes (From Whom) Patient states concerns and preferences for aftercare planning are: Therapy and MM in [place] and [place], appts already scheduled Patient states they will know when they are safe and ready for discharge when: "I  don't feel like I need to be there" Does patient have access to transportation?: Yes Does patient have financial barriers related to discharge medications?: No Patient description of barriers related to discharge medications: None reported Will patient be returning to same living situation after discharge?: Yes  Summary/Recommendations:   Summary and Recommendations (to be completed by the evaluator): [name] is a 53 year old male who is involuntarily admitted to [facility] ED due to alcohol intoxication and making suicidal statements. Pt reports being a veteran who is on 100% disability through the [place]. Pt has his own home in [place] and follows up with the [place] in [place] as well as in [place] where he reports travelling often for work. Pt endorses going through "alcohol episodes" where he drinks about 20-25 beers daily. Pt reports having been to "every alcohol rehab center you could think of" including; [facility], [facility] [address], [facility], and [facility]. Pt is not interested in substance use treatment at this time and states "I really don't need to be here. I have it good outside of here." Pt reports that his girlfriend is who took him to the ED because she "does not like when I drink." Pt has a son who he reports is his best friend and they have a strong relationship. Pt denies AVH, SI and HI. Pt plans to return home at discharge. While here, [facility] can benefit from crisis stabilization, medication management, therapeutic milieu, and referrals for services.   Kathi Der. 10/27/2023

## 2023-10-27 NOTE — Plan of Care (Signed)
  Problem: Activity: Goal: Interest or engagement in activities will improve Outcome: Progressing Goal: Sleeping patterns will improve Outcome: Progressing   Problem: Physical Regulation: Goal: Ability to maintain clinical measurements within normal limits will improve Outcome: Progressing   Problem: Safety: Goal: Periods of time without injury will increase Outcome: Progressing   Problem: Education: Goal: Emotional status will improve Outcome: Progressing

## 2023-10-27 NOTE — Group Note (Signed)
Date:  10/27/2023 Time:  9:33 AM  Group Topic/Focus:  Goals Group:   The focus of this group is to help patients establish daily goals to achieve during treatment and discuss how the patient can incorporate goal setting into their daily lives to aide in recovery. Orientation:   The focus of this group is to educate the patient on the purpose and policies of crisis stabilization and provide a format to answer questions about their admission.  The group details unit policies and expectations of patients while admitted.    Participation Level:  Active  Participation Quality:  Appropriate  Affect:  Appropriate  Cognitive:  Appropriate  Insight: Appropriate  Engagement in Group:  Engaged  Modes of Intervention:  Discussion  Additional Comments:    Pranavi Aure D Kalinda Romaniello 10/27/2023, 9:33 AM

## 2023-10-27 NOTE — Group Note (Signed)
Recreation Therapy Group Note   Group Topic:Animal Assisted Therapy   Group Date: 10/27/2023 Start Time: 6578 End Time: 1030 Facilitators: Clydia Nieves-McCall, LRT,CTRS Location: [address] Dayroom   Animal-Assisted Activity (AAA) Program Checklist/Progress Notes Patient Eligibility Criteria Checklist & Daily Group note for Rec Tx Intervention  AAA/T Program Assumption of Risk Form signed by Patient/ or Parent Legal Guardian Yes  Patient understands his/her participation is voluntary Yes  Education: Charity fundraiser, Appropriate Animal Interaction   Education Outcome: Acknowledges education.    Affect/Mood: N/A   Participation Level: Did not attend    Clinical Observations/Individualized Feedback:    Plan: Continue to engage patient in RT group sessions 2-3x/week.   Loranzo Desha-McCall, LRT,CTRS 10/27/2023 1:15 PM

## 2023-10-27 NOTE — [facility] Group Notes (Signed)
[facility] Group Notes:  (Nursing/MHT/Case Management/Adjunct)  Date:  10/27/2023  Time:  10:36 PM  Type of Therapy:  Psychoeducational Skills  Participation Level:  Active  Participation Quality:  Appropriate  Affect:  Appropriate  Cognitive:  Appropriate  Insight:  Good  Engagement in Group:  Improving  Modes of Intervention:  Education  Summary of Progress/Problems: Patient rated his day as an 8 out of 10 since he stated that he had a "good day". He explained that he spoke with his doctor and found out that he will be discharged on Thursday or Friday. He also mentioned that he learned from his peers today. His goal for tomorrow is to not worry about what others think about him while he is hospitalized since he is in the hospital for his own benefit.   Hazle Coca S 10/27/2023, 10:36 PM

## 2023-10-27 NOTE — Plan of Care (Signed)
  Problem: Education: Goal: Emotional status will improve Outcome: Progressing Goal: Mental status will improve Outcome: Progressing Goal: Verbalization of understanding the information provided will improve Outcome: Progressing  Patient visible in Milieu refused HS medications Lovenox and Phenobarbital Provider notified no new orders. Patient denies SI/HI/A/VH endorses Loneliness at home. Support and encouragement ongoing.

## 2023-10-27 NOTE — Progress Notes (Signed)
   10/27/23 0835  Psych Admission Type (Psych Patients Only)  Admission Status Involuntary  Psychosocial Assessment  Patient Complaints Loneliness;Substance abuse  Eye Contact Fair  Facial Expression Anxious;Worried;Sad  Affect Anxious;Apprehensive;Depressed  Speech Logical/coherent  Interaction Assertive  Motor Activity Other (Comment) (wnl)  Appearance/Hygiene Unremarkable  Behavior Characteristics Cooperative;Anxious;Appropriate to situation  Mood Depressed;Anxious  Thought Process  Coherency WDL  Content WDL  Delusions None reported or observed  Perception WDL  Hallucination None reported or observed  Judgment WDL  Confusion None  Danger to Self  Current suicidal ideation? Denies  Agreement Not to Harm Self Yes  Description of Agreement Verbal  Danger to Others  Danger to Others None reported or observed

## 2023-10-27 NOTE — H&P (Addendum)
Psychiatric Admission Assessment Adult  Patient Identification: [name] MRN:  [id number] Date of Evaluation:  10/27/2023 Chief Complaint:  Alcohol-induced mood disorder (HCC) [F10.94] Principal Diagnosis: Adjustment disorder with mixed anxiety and depressed mood Diagnosis:  Principal Problem:   Adjustment disorder with mixed anxiety and depressed mood Active Problems:   Alcohol use disorder, severe, dependence (HCC)    CC: "I don't need to be here," concern for suicidal thoughts after telling security guard to "just shoot me"   [name] is a 53 y.o. male  with a past psychiatric history of PTSD, alcohol use disorder, and numerous hospitalizations for alcohol withdrawal and substance rehab. Patient initially arrived to [facility] on 10/22/23 for alcohol intoxication, and admitted to [facility] under IVC on 10/26/23 for acute safety concerns.   Collateral Information, [name], son, [phone]:  provided consent to gather information and provide information to his son and provided this phone number. Reports no concerns about his ending his life or suicidal thoughts or attempts. Reports taht he does drink a lot but has never had suicidal thoughts or severe depression. Reports taht his PSD affects him a lot but no safet yconcerns. Son reports that he seems him daily. No firearms at home per son. Reports that he is unsure if his ex-partner is out of his life but son also stays out of that information. Reports that his dad sees a Warden/ranger through [place]. Asked about options for treating PTSD in the area, and I recommended that he talked with his [facility] case worker since they have best acess to resources for him including med managent, trauma focused therapy, EMDR, etc.   HPI:  Patient reports that he was brought to the hospital after returning to alcohol use and when his girlfriend found him and became upset that he was drinking again.  Patient reports that he has been dealing with alcohol use disorder  for a lot of his life largely as a way to cope with his PTSD.  He reports that he is in and out of the hospital and in and out of rehab programs.  He reports that lately he had been doing well with regard to his drinking.  However, he went on a trip to [place] with his girlfriend and during this trip he reported that she was upset and crying consistently because it was cold.  Patient reports that he has been having a lot of relationship issues with girlfriend because of her "codependency issues."  Reports that he became upset by the amount of crying and complaining she was doing and this led to him breaking up with her and returning to alcohol use after return from the trip.  He reports that he had gotten pretty intoxicated and she showed up to the house, despite being broken up with, and that she called the cops on him to have him brought to the hospital, against his will.  While in the hospital, he was refusing medications and security had to be called to the room due to agitation.  While secretes room and he was upset at the situation, he told the security officer to "just shoot me."  Patient reports that he said this not because he was suicidal but because he felt like he was upset the way that he is being treated he would have rather just not been on this earth if this is how life is going to be.  He reports that it was just an impulsive statement that he said and he understands why  he was brought to the behavioral hospital due to that comment but authentically does not feel that he is depressed or has had any suicidal thoughts currently or any time in his life.  He insists on this to the point that he recommended calling his son who knows him very well and reports that he thinks his son would be helpful for clarifying his lack of suicidality.  He does report that he has a longstanding history of PTSD and that he has ongoing [facility] with the [facility] to help with this.  He reports that he has significant nightmares.   Patient adamantly reports during interview that he does not want to be started on any medications.  Regarding alcohol withdrawal he endorses visual disturbances (reports that objects are moving a little bit) but denies tremors, nausea, vomiting, diarrhea, sweats, anxiety, insomnia, and is oriented to person place and time.  Psychiatric ROS Depression Symptoms Patient denies depressed mood, anhedonia, and suicidal thoughts.  Reports minimal burden of these symptoms in the past.  Denies any past suicidal thoughts, suicide attempts, or self-harm.  Manic Symptoms Patient denies current or lifetime mania symptoms.  Anxiety Symptoms Patient endorses occasional anxiety but on further discussion anxiety symptoms patient's "anxiety" is secondary to trauma.  Trauma Symptoms Patient has longstanding PTSD so did not discuss full diagnostic criteria patient given he already met criteria in the past and continues to have significant issues stemming from this disorder.  He does report he has significant nightmares.  He discusses "anxiety" consistent with pathologic hyperarousal symptoms in PTSD.  He reports that he has a Warden/ranger in the [place] for therapy for his PTSD.  Reports that cannabis helps with nightmares.  Reports has not been on medications for PTSD.  Psychosis Symptoms Denies current or lifetime hallucinations or paranoia.   Past Psychiatric Hx: Current Psychiatrist: None Current Therapist: [facility] with [facility] Previous Psychiatric Diagnoses: PTSD, alcohol use disorder Psychiatric Medications: Current None Past Paroxetine and venlafaxine but reports these made things worse Psychiatric Hospitalization hx: Numerous hospitalizations for substance use disorder but no other reasons. Psychotherapy hx: Therapy at [facility] but does not describe what program Neuromodulation history: Denies History of suicide: Denies History of homicide or aggression: Denies  Substance Use Hx: Alcohol:  Significant alcohol use, numerous hospitalizations and rehabs, continued use and binge drinks Tobacco: Smokes cigarettes but does not quantify Cannabis: Reports occasional cannabis use because it "helps with nightmares." Other Illicit drugs: Denies Rx drug abuse: Denies Rehab hx: Long history of alcohol rehab  Past Medical History: [facility] Dx: None Medications: None Allergies: Paroxetine, venlafaxine, NicoDerm Hospitalizations: Numerous for alcohol-related reasons Surgeries: Numerous hernia surgeries Trauma: Denies Seizures: Denies  Family Medical History: Unsure  Family Psychiatric History: Psychiatric Dx: PTSD in father Suicide Hx: Denies Violence/Aggression: Denies Substance use: Yes in first-degree relatives  Social History: Current Living Situation: Lives at his home which he owns Education: High school and 1 year of college Occupational hx: Hotel manager, retired Marital Status: Single, was in relationship but broke up Children: Son who he is close with (collateral) Legal: Denies Military: Yes  Access to firearms: Patient denies and son also denies   Total Time spent with patient: 1 hour  Is the patient at risk to self? No.  Has the patient been a risk to self in the past 6 months? No.  Has the patient been a risk to self within the distant past? No.  Is the patient a risk to others? No.  Has the  patient been a risk to others in the past 6 months? No.  Has the patient been a risk to others within the distant past? No.   [place] Scale:  Flowsheet Row [facility] (Current) [facility] Hosp-Admission (Discharged) from 10/22/2023 in [facility] ED from 06/18/2023 in [facility] Emergency Department at [facility]  C-SSRS RISK CATEGORY High Risk High Risk No Risk        Tobacco Screening:  Social History   Tobacco Use  Smoking Status Every Day    Current packs/day: 2.00   Average packs/day: 2.0 packs/day for 20.0 years (40.0 ttl pk-yrs)   Types: Cigarettes  Smokeless Tobacco Never    BH Tobacco Counseling     Are you interested in Tobacco Cessation Medications?  No, patient refused Counseled patient on smoking cessation:  Refused/Declined practical counseling Reason Tobacco Screening Not Completed: Patient Refused Screening       Social History:  Social History   Substance and Sexual Activity  Alcohol Use Not Currently   Comment: Patient states he is a binge drinker. Last drink 06/09/23     Social History   Substance and Sexual Activity  Drug Use Yes   Types: Marijuana    Additional Social History: Marital status: Divorced Divorced, when?: May 2018 What types of issues is patient dealing with in the relationship?: "We were married for 26 years" Additional relationship information: Arguments with current girlfriend about drinking Are you sexually active?: Yes What is your sexual orientation?: heterosexual Has your sexual activity been affected by drugs, alcohol, medication, or emotional stress?: No Does patient have children?: Yes How many children?: 1 How is patient's relationship with their children?: "That's my boy, we are best friends"                         Allergies:   Allergies  Allergen Reactions   Effexor [Venlafaxine] Hives   Paroxetine Hives   Nicoderm [Nicotine] Rash   Paxil [Paroxetine Hcl] Hives and Rash   Lab Results:  Results for orders placed or performed during the hospital encounter of 10/22/23 (from the past 48 hours)  CBC     Status: None   Collection Time: 10/26/23  4:35 AM  Result Value Ref Range   WBC 5.3 4.0 - 10.5 K/uL   RBC 5.72 4.22 - 5.81 MIL/uL   Hemoglobin 15.3 13.0 - 17.0 g/dL   HCT 66.0 63.0 - 16.0 %   MCV 81.5 80.0 - 100.0 fL   MCH 26.7 26.0 - 34.0 pg   MCHC 32.8 30.0 - 36.0 g/dL   RDW 10.9 32.3 - 55.7 %   Platelets 199 150 - 400 K/uL   nRBC 0.0 0.0 -  0.2 %    Comment: Performed at [facility], [address] [place], [place] [address]  Comprehensive metabolic panel     Status: Abnormal   Collection Time: 10/26/23  4:35 AM  Result Value Ref Range   Sodium 140 135 - 145 mmol/L   Potassium 3.6 3.5 - 5.1 mmol/L   Chloride 105 98 - 111 mmol/L   CO2 24 22 - 32 mmol/L   Glucose, Bld 87 70 - 99 mg/dL    Comment: Glucose reference range applies only to samples taken after fasting for at least 8 hours.   BUN 13 6 - 20 mg/dL   Creatinine, Ser 5.42 (L) 0.61 -  1.24 mg/dL   Calcium 8.9 8.9 - 16.1 mg/dL   Total Protein 6.8 6.5 - 8.1 g/dL   Albumin 3.8 3.5 - 5.0 g/dL   AST 26 15 - 41 U/L   ALT 28 0 - 44 U/L   Alkaline Phosphatase 44 38 - 126 U/L   Total Bilirubin 0.7 0.0 - 1.2 mg/dL   GFR, Estimated >09 >60 mL/min    Comment: (NOTE) Calculated using the CKD-EPI Creatinine Equation (2021)    Anion gap 11 5 - 15    Comment: Performed at [facility], [address] [place], [place] [address]  Magnesium     Status: None   Collection Time: 10/26/23  4:35 AM  Result Value Ref Range   Magnesium 2.2 1.7 - 2.4 mg/dL    Comment: Performed at [facility], [address] [place], [place] [address]    Blood Alcohol level:  Lab Results  Component Value Date   ETH 12 (H) 10/23/2023   ETH 275 (H) 10/22/2023    Metabolic Disorder Labs:  No results found for: "HGBA1C", "MPG" No results found for: "PROLACTIN" Lab Results  Component Value Date   CHOL 225 (H) 10/14/2013   TRIG 112 10/14/2013   HDL 62 10/14/2013   CHOLHDL 3.6 10/14/2013   VLDL 22 10/14/2013   LDLCALC 141 (H) 10/14/2013   LDLCALC (H) 12/24/2010    156        Total Cholesterol/HDL:CHD Risk Coronary Heart Disease Risk Table                     Men   Women  1/2 Average Risk   3.4   3.3  Average Risk       5.0   4.4  2 X Average Risk   9.6   7.1  3 X Average Risk  23.4   11.0        Use the calculated Patient  Ratio above and the CHD Risk Table to determine the patient's CHD Risk.        ATP III CLASSIFICATION (LDL):  <100     mg/dL   Optimal  478-295  mg/dL   Near or Above                    Optimal  130-159  mg/dL   Borderline  621-308  mg/dL   High  >657     mg/dL   Very High    Current Medications: Current Facility-Administered Medications  Medication Dose Route Frequency Provider Last Rate Last Admin   acetaminophen (TYLENOL) tablet 650 mg  650 mg Oral Q6H PRN Onuoha, Josephine C, NP       alum & mag hydroxide-simeth (MAALOX/MYLANTA) 200-200-20 MG/5ML suspension 30 mL  30 mL Oral Q4H PRN Onuoha, Josephine C, NP       bisacodyl (DULCOLAX) EC tablet 5 mg  5 mg Oral Daily PRN Onuoha, Josephine C, NP       haloperidol (HALDOL) tablet 5 mg  5 mg Oral TID PRN Dahlia Byes C, NP       And   diphenhydrAMINE (BENADRYL) capsule 50 mg  50 mg Oral TID PRN Dahlia Byes C, NP       haloperidol lactate (HALDOL) injection 5 mg  5 mg Intramuscular TID PRN Dahlia Byes C, NP       And   diphenhydrAMINE (BENADRYL) injection 50 mg  50 mg Intramuscular TID PRN Earney Navy, NP  And   LORazepam (ATIVAN) injection 2 mg  2 mg Intramuscular TID PRN Dahlia Byes C, NP       haloperidol lactate (HALDOL) injection 10 mg  10 mg Intramuscular TID PRN Dahlia Byes C, NP       And   diphenhydrAMINE (BENADRYL) injection 50 mg  50 mg Intramuscular TID PRN Dahlia Byes C, NP       And   LORazepam (ATIVAN) injection 2 mg  2 mg Intramuscular TID PRN Earney Navy, NP       feeding supplement (ENSURE ENLIVE / ENSURE PLUS) liquid 237 mL  237 mL Oral BID BM Massengill, Harrold Donath, MD   237 mL at 10/27/23 1106   gabapentin (NEURONTIN) capsule 300 mg  300 mg Oral TID Dahlia Byes C, NP   300 mg at 10/27/23 0756   guaiFENesin (ROBITUSSIN) 100 MG/5ML liquid 5 mL  5 mL Oral Q4H PRN Dahlia Byes C, NP       hydrALAZINE (APRESOLINE) injection 10 mg  10 mg Intravenous Q4H PRN  Dahlia Byes C, NP       hydrOXYzine (ATARAX) tablet 25 mg  25 mg Oral TID PRN Meryl Dare, MD       ipratropium-albuterol (DUONEB) 0.5-2.5 (3) MG/3ML nebulizer solution 3 mL  3 mL Nebulization Q4H PRN Onuoha, Josephine C, NP       LORazepam (ATIVAN) tablet 1 mg  1 mg Oral Q6H PRN Meryl Dare, MD       Or   LORazepam (ATIVAN) injection 1 mg  1 mg Intramuscular Q6H PRN Meryl Dare, MD       LORazepam (ATIVAN) injection 2 mg  2 mg Intravenous Q4H PRN Onuoha, Josephine C, NP       magnesium hydroxide (MILK OF MAGNESIA) suspension 30 mL  30 mL Oral Daily PRN Dahlia Byes C, NP       metoprolol tartrate (LOPRESSOR) injection 5 mg  5 mg Intravenous Q4H PRN Dahlia Byes C, NP       nicotine (NICODERM CQ - dosed in mg/24 hours) patch 21 mg  21 mg Transdermal Daily Massengill, Harrold Donath, MD   21 mg at 10/27/23 0757   nicotine polacrilex (NICORETTE) gum 2 mg  2 mg Oral PRN Massengill, Harrold Donath, MD       thiamine (Vitamin B-1) tablet 100 mg  100 mg Oral Daily Dahlia Byes C, NP   100 mg at 10/27/23 6010   Or   thiamine (VITAMIN B1) injection 100 mg  100 mg Intravenous Daily Dahlia Byes C, NP       traZODone (DESYREL) tablet 50 mg  50 mg Oral QHS PRN Meryl Dare, MD       PTA Medications: Medications Prior to Admission  Medication Sig Dispense Refill Last Dose/Taking   acetaminophen (TYLENOL) 500 MG tablet Take 500 mg by mouth daily as needed for mild pain (pain score 1-3) or moderate pain (pain score 4-6).      folic acid (FOLVITE) 1 MG tablet Take 1 tablet (1 mg total) by mouth daily. (Patient not taking: Reported on 10/22/2023) 30 tablet 0    gabapentin (NEURONTIN) 300 MG capsule Take 300 mg by mouth 3 (three) times daily. (Patient not taking: Reported on 10/22/2023)      Multiple Vitamin (MULTIVITAMIN WITH MINERALS) TABS tablet Take 1 tablet by mouth daily. (Patient not taking: Reported on 10/22/2023) 130 tablet 0    thiamine (VITAMIN B-1) 100 MG tablet Take 1 tablet (100  mg total) by mouth daily. (Patient  not taking: Reported on 10/22/2023) 30 tablet 0     Musculoskeletal: Strength & Muscle Tone: within normal limits Gait & Station: normal Patient leans: N/A  Psychiatric Specialty Exam:  Presentation  General Appearance:  Appropriate for Environment  Eye Contact: Good  Speech: Normal Rate  Speech Volume: Normal  Handedness: Right   Mood and Affect  Mood: Euthymic  Affect: Appropriate; Congruent; Full Range   Thought Process  Thought Processes: Coherent; Linear  Descriptions of Associations: Intact  Orientation: Full (Time, Place and Person)  Thought Content: Logical  History of Schizophrenia/Schizoaffective disorder:No data recorded Duration of Psychotic Symptoms:N/A Hallucinations:Hallucinations: None  Ideas of Reference: None  Suicidal Thoughts:Suicidal Thoughts: No  Homicidal Thoughts:Homicidal Thoughts: No   Sensorium  Memory: Immediate Good; Recent Good; Remote Good  Judgment: Fair  Insight: Good   Executive Functions  Concentration: Good  Attention Span: Good  Recall: Good  Fund of Knowledge: Good  Language: Good   Psychomotor Activity  Psychomotor Activity:Psychomotor Activity: Normal   Assets  Assets: Communication Skills; Desire for Improvement; Financial Resources/Insurance; Housing; Resilience; Social Support   Sleep  Sleep:Sleep: Good (Sleeps well except for nightmares but can go back to sleep after.) Number of Hours of Sleep: 7    Physical Exam: Physical Exam Vitals and nursing note reviewed.  Constitutional:      General: He is not in acute distress.    Appearance: He is not ill-appearing or diaphoretic.  HENT:     Head: Normocephalic and atraumatic.  Pulmonary:     Effort: Pulmonary effort is normal.  Neurological:     General: No focal deficit present.     Mental Status: He is alert and oriented to person, place, and time.    Review of Systems   Constitutional:  Negative for diaphoresis and fever.  Cardiovascular:  Negative for chest pain and palpitations.  Gastrointestinal:  Negative for constipation, diarrhea, nausea and vomiting.  Neurological:  Negative for dizziness, tremors, weakness and headaches.  Psychiatric/Behavioral:  Negative for suicidal ideas. The patient is not nervous/anxious and does not have insomnia.        Positive for visual disturbances   CIWA per this exam: 1  Blood pressure 124/82, pulse 74, temperature 97.9 F (36.6 C), temperature source Oral, resp. rate 18, height 5\' 10"  (1.778 m), weight 64.9 kg, SpO2 98%. Body mass index is 20.52 kg/m.   Treatment Plan Summary: Daily contact with patient to assess and evaluate symptoms and progress in treatment and Medication management   ASSESSMENT & PLAN  ASSESSMENT:   Diagnoses / Active Problems: Principal Problem:   Adjustment disorder with mixed anxiety and depressed mood Active Problems:   Alcohol use disorder, severe, dependence (HCC)   SAM HARDESTER is a 53 y.o. male with past history of PTSD and alcohol use disorder who was admitted to [facility] after telling security to "just shoot me" when he was upset.  Patient has no history of MDD, suicidal thoughts, or suicide attempts and currently denies symptoms of depression including depressed mood and anhedonia.  Collateral from son confirms this.  Patient has severe PTSD and severe alcohol use and his in the hospital monthly.  Discussed starting medication to patient and he is adamantly against any meds including for PTSD, nightmares, or alcohol cravings.  Discussed doing residential rehab and patient reports that he is having [facility] case [facility] [place].  Patient reports that he has a therapist in [place] system.  Regarding alcohol withdrawal, spoke  to hospitalist who agreed that patient does not need anymore doses of phenobarbital if not having severe symptoms.  On exam patient has  minimal alcohol withdrawal symptoms and CIWA was by nursing over the last day have also been minimal.  Plan to observe patient and discharge on likely Thursday/Friday if no concerns.   PLAN: Safety and Monitoring:             -- Involuntary admission to inpatient psychiatric unit for safety, stabilization and treatment             -- Daily contact with patient to assess and evaluate symptoms and progress in treatment             -- Patient's case to be discussed in multi-disciplinary team meeting             -- Observation Level : q15 minute checks             -- Vital [name]:  q12 hours             -- Precautions: suicide, elopement, and assault   2. Psychiatric Diagnoses and Treatment:  -- If patient is agreeable to medications in future, would consider Prozac 20 mg once daily for PTSD or prazosin for nightmares.  -- Continue trazodone 50 mg once nightly as needed for insomnia  -- Continue hydroxyzine 25 mg 3 times daily as needed for anxiety  --  The risks/benefits/side-effects/alternatives to this medication were discussed in detail with the patient and time was given for questions. The patient consents to medication trial.              -- Metabolic profile and EKG monitoring obtained while on an atypical antipsychotic. See #4 below for values.              -- Encouraged patient to participate in unit milieu and in scheduled group therapies              -- Short Term Goals: Ability to identify changes in lifestyle to reduce recurrence of condition will improve, Ability to verbalize feelings will improve, Ability to disclose and discuss suicidal ideas, Ability to demonstrate self-control will improve, Ability to identify and develop effective coping behaviors will improve, Ability to maintain clinical measurements within normal limits will improve, Compliance with prescribed medications will improve, and Ability to identify triggers associated with substance abuse/mental health issues will  improve             -- Long Term Goals: Improvement in symptoms so as ready for discharge                3. Medical Issues Being Addressed:              -- Alcohol withdrawal syndrome: CIWA with Ativan as needed    -- Continue PRN's: Tylenol, Maalox, Milk of Magnesia   4. Routine and other pertinent labs reviewed: EKG monitoring: QTc: 427  Metabolism / endocrine: BMI: Body mass index is 20.52 kg/m. Prolactin: No results found for: "PROLACTIN" Lipid Panel: Lab Results  Component Value Date   CHOL 225 (H) 10/14/2013   TRIG 112 10/14/2013   HDL 62 10/14/2013   CHOLHDL 3.6 10/14/2013   VLDL 22 10/14/2013   LDLCALC 141 (H) 10/14/2013   LDLCALC (H) 12/24/2010    156        Total Cholesterol/HDL:CHD Risk Coronary Heart Disease Risk Table  Men   Women  1/2 Average Risk   3.4   3.3  Average Risk       5.0   4.4  2 X Average Risk   9.6   7.1  3 X Average Risk  23.4   11.0        Use the calculated Patient Ratio above and the CHD Risk Table to determine the patient's CHD Risk.        ATP III CLASSIFICATION (LDL):  <100     mg/dL   Optimal  329-518  mg/dL   Near or Above                    Optimal  130-159  mg/dL   Borderline  841-660  mg/dL   High  >630     mg/dL   Very High   [id number]: No results found for: "HGBA1C" TSH: TSH (uIU/mL)  Date Value  10/23/2023 0.783  10/14/2013 0.991    Labs to order: None currently  5. Discharge Planning:              -- Social work and case management to assist with discharge planning and identification of hospital follow-up needs prior to discharge             -- Estimated LOS: 7-10 days, but would consider discharge on Thursday/Friday if continues to be stable             -- Discharge Concerns: Need to establish a safety plan; Medication compliance and effectiveness             -- Discharge Goals: Return home with outpatient referrals for mental health follow-up including medication management/psychotherapy     I certify that inpatient services furnished can reasonably be expected to improve the patient's condition.   This note was created using a voice recognition software as a result there may be grammatical errors inadvertently enclosed that do not reflect the nature of this encounter. Every attempt is made to correct such errors.   Meryl Dare, MD PGY-1 Psychiatry Resident 10/27/2023, 2:13 PM

## 2023-10-28 ENCOUNTER — Encounter (HOSPITAL_COMMUNITY): Payer: Self-pay

## 2023-10-28 NOTE — Plan of Care (Signed)

## 2023-10-28 NOTE — Group Note (Signed)
Recreation Therapy Group Note   Group Topic:Other  Group Date: 10/28/2023 Start Time: 1405 End Time: 1445 Facilitators: Harvis Mabus-McCall, LRT,CTRS Location: [address] Dayroom   Activity Description/Intervention: Therapeutic Drumming. Patients with peers and staff were given the opportunity to engage in a leader facilitated [facility] Empowerment Drumming Circle with staff from the [employer], in partnership with [employer]. Teaching laboratory technician and trained [employer], Theodoro Doing leading with LRT observing and documenting intervention and pt response. This evidenced-based practice targets 7 areas of health and wellbeing in the human experience including: stress-reduction, exercise, self-expression, camaraderie/support, nurturing, spirituality, and music-making (leisure).   Goal Area(s) Addresses:  Patient will engage in pro-social way in music group.  Patient will follow directions of drum leader on the first prompt. Patient will demonstrate no behavioral issues during group.  Patient will identify if a reduction in stress level occurs as a result of participation in therapeutic drum circle.    Education: Leisure exposure, Pharmacologist, Musical expression, Discharge Planning   Affect/Mood: Appropriate   Participation Level: Engaged   Participation Quality: Independent   Behavior: Appropriate   Speech/Thought Process: Focused   Insight: Good   Judgement: Good   Modes of Intervention: Teaching laboratory technician   Patient Response to Interventions:  Engaged   Education Outcome:  In group clarification offered    Clinical Observations/Individualized Feedback: [name] actively engaged in therapeutic drumming exercise and discussions. Pt was appropriate with peers, staff, and musical equipment for duration of programming.  Pt identified "great, dope" as their feeling after participation in music-based programming. Pt affect congruent/incongruent  with verbalized emotion.    Plan: Continue to engage patient in RT group sessions 2-3x/week.   [name], LRT,CTRS 10/28/2023 3:35 PM

## 2023-10-28 NOTE — Progress Notes (Signed)
[facility] MD Progress Note  10/28/2023 11:34 AM [name]  MRN:  [id number]   Reason for Admission:  "I don't need to be here," concern for suicidal thoughts after telling security guard to "just shoot me"    [name] is a 53 y.o. male  with a past psychiatric history of PTSD, alcohol use disorder, and numerous hospitalizations for alcohol withdrawal and substance rehab. Patient initially arrived to [facility] on 10/22/23 for alcohol intoxication, and admitted to [facility] under IVC on 10/26/23 for acute safety concerns. The patient is currently on Hospital Day 2.   Chart Review from last 24 hours:  The patient's chart was reviewed and nursing notes were reviewed. The patient's case was discussed in multidisciplinary team meeting. Per [facility] patient used Atarax as needed for anxiety this morning, use of trazodone 50 mg as needed for sleep last night 1/21  Information Obtained Today During Patient Interview: The patient was seen and evaluated on the unit. On assessment today the patient reports continuing to have good stable mood without any depressed mood or anxiety but later reports on and off feeling shaky, he continues to disagree to start any scheduled daily medication but today he agrees to try Vistaril as needed for anxiety, will follow.  He used trazodone as needed for sleep last night because of the changing environment and he reports it helped without any side effects.  He reports fair sleep and appetite.  He continues to deny passive or active SI intention or plan and reports his suicidal statement probably was related to him being intoxicated and angry with his girlfriend.  He denies HI or AVH today he tells me he probably will go back with his girlfriend as their relationship has been fluctuating and they have been on and off together.  He also made phone calls with his son yesterday and it went well.  He is attending groups and participating and able to discuss coping skills with stressors as  well as crisis plan if recurring SI after discharge.  He denies any craving to alcohol and reports will continue to work with his outpatient Child psychotherapist at [place] [place].  He describes himself as a binge drinker and denies any withdrawals and does not present in any withdrawals from alcohol. He continues to refuse starting SSRI as scheduled daily but agrees to be referred to outpatient psychiatric provider at [facility] after discharge to discuss further.  I discussed with patient plan to discharge him tomorrow if he continues to do well and he agrees.   Sleep  Good sleep reported last night with help of trazodone   Principal Problem: Adjustment disorder with mixed anxiety and depressed mood Diagnosis: Principal Problem:   Adjustment disorder with mixed anxiety and depressed mood Active Problems:   Alcohol use disorder, severe, dependence (HCC)    Past Psychiatric History:  Current Psychiatrist: None Current Therapist: [facility] Previous Psychiatric Diagnoses: PTSD, alcohol use disorder Psychiatric Medications: Current None Past Paroxetine and venlafaxine but reports these made things worse Psychiatric Hospitalization hx: Numerous hospitalizations for substance use disorder but no other reasons. Psychotherapy hx: Therapy at [facility] but does not describe what program Neuromodulation history: Denies History of suicide: Denies History of homicide or aggression: Denies  Past Medical History:  Past Medical History:  Diagnosis Date   Active smoker    Alcohol abuse    Anxiety    Bipolar 2 disorder (HCC)    Depression    PTSD (post-traumatic stress disorder)  Past Surgical History:  Procedure Laterality Date   HERNIA REPAIR     LYMPH NODE DISSECTION     Family History:  Family History  Problem Relation Age of Onset   Alcohol abuse Father    Family Psychiatric  History:  Psychiatric Dx: PTSD in father Suicide Hx: Denies Violence/Aggression:  Denies Substance use: Yes in first-degree relatives Social History:  Current Living Situation: Lives at his home which he owns Education: High school and 1 year of college Occupational hx: Hotel manager, retired Marital Status: Single, was in relationship but broke up Children: Son who he is close with (collateral) Legal: Denies Military: Yes   Access to firearms: Patient denies and son also denies Substance Use Hx: Alcohol: Significant alcohol use, numerous hospitalizations and rehabs, continued use and binge drinks Tobacco: Smokes cigarettes but does not quantify Cannabis: Reports occasional cannabis use because it "helps with nightmares." Other Illicit drugs: Denies Rx drug abuse: Denies Rehab hx: Long history of alcohol rehab  Current Medications: Current Facility-Administered Medications  Medication Dose Route Frequency Provider Last Rate Last Admin   acetaminophen (TYLENOL) tablet 650 mg  650 mg Oral Q6H PRN Dahlia Byes C, NP   650 mg at 10/28/23 1042   alum & mag hydroxide-simeth (MAALOX/MYLANTA) 200-200-20 MG/5ML suspension 30 mL  30 mL Oral Q4H PRN Onuoha, Josephine C, NP       bisacodyl (DULCOLAX) EC tablet 5 mg  5 mg Oral Daily PRN Onuoha, Josephine C, NP       haloperidol (HALDOL) tablet 5 mg  5 mg Oral TID PRN Dahlia Byes C, NP       And   diphenhydrAMINE (BENADRYL) capsule 50 mg  50 mg Oral TID PRN Dahlia Byes C, NP       haloperidol lactate (HALDOL) injection 5 mg  5 mg Intramuscular TID PRN Dahlia Byes C, NP       And   diphenhydrAMINE (BENADRYL) injection 50 mg  50 mg Intramuscular TID PRN Dahlia Byes C, NP       And   LORazepam (ATIVAN) injection 2 mg  2 mg Intramuscular TID PRN Dahlia Byes C, NP       haloperidol lactate (HALDOL) injection 10 mg  10 mg Intramuscular TID PRN Dahlia Byes C, NP       And   diphenhydrAMINE (BENADRYL) injection 50 mg  50 mg Intramuscular TID PRN Dahlia Byes C, NP       And   LORazepam  (ATIVAN) injection 2 mg  2 mg Intramuscular TID PRN Dahlia Byes C, NP       feeding supplement (ENSURE ENLIVE / ENSURE PLUS) liquid 237 mL  237 mL Oral BID BM Massengill, Harrold Donath, MD   237 mL at 10/28/23 1033   gabapentin (NEURONTIN) capsule 300 mg  300 mg Oral TID Dahlia Byes C, NP   300 mg at 10/28/23 0757   guaiFENesin (ROBITUSSIN) 100 MG/5ML liquid 5 mL  5 mL Oral Q4H PRN Dahlia Byes C, NP       hydrALAZINE (APRESOLINE) injection 10 mg  10 mg Intravenous Q4H PRN Dahlia Byes C, NP       hydrOXYzine (ATARAX) tablet 25 mg  25 mg Oral TID PRN Meryl Dare, MD   25 mg at 10/28/23 1042   ipratropium-albuterol (DUONEB) 0.5-2.5 (3) MG/3ML nebulizer solution 3 mL  3 mL Nebulization Q4H PRN Onuoha, Josephine C, NP       LORazepam (ATIVAN) tablet 1 mg  1 mg Oral Q6H PRN Meryl Dare,  MD       Or   LORazepam (ATIVAN) injection 1 mg  1 mg Intramuscular Q6H PRN Meryl Dare, MD       LORazepam (ATIVAN) injection 2 mg  2 mg Intravenous Q4H PRN Welford Roche, Josephine C, NP       magnesium hydroxide (MILK OF MAGNESIA) suspension 30 mL  30 mL Oral Daily PRN Dahlia Byes C, NP       metoprolol tartrate (LOPRESSOR) injection 5 mg  5 mg Intravenous Q4H PRN Dahlia Byes C, NP       nicotine (NICODERM CQ - dosed in mg/24 hours) patch 21 mg  21 mg Transdermal Daily Massengill, Harrold Donath, MD   21 mg at 10/28/23 0757   nicotine polacrilex (NICORETTE) gum 2 mg  2 mg Oral PRN Massengill, Harrold Donath, MD       thiamine (Vitamin B-1) tablet 100 mg  100 mg Oral Daily Onuoha, Josephine C, NP   100 mg at 10/28/23 1610   Or   thiamine (VITAMIN B1) injection 100 mg  100 mg Intravenous Daily Dahlia Byes C, NP       traZODone (DESYREL) tablet 50 mg  50 mg Oral QHS PRN Meryl Dare, MD   50 mg at 10/27/23 2233    Lab Results: No results found for this or any previous visit (from the past 48 hours).  Blood Alcohol level:  Lab Results  Component Value Date   ETH 12 (H) 10/23/2023   ETH 275  (H) 10/22/2023    Metabolic Disorder Labs: No results found for: "HGBA1C", "MPG" No results found for: "PROLACTIN" Lab Results  Component Value Date   CHOL 225 (H) 10/14/2013   TRIG 112 10/14/2013   HDL 62 10/14/2013   CHOLHDL 3.6 10/14/2013   VLDL 22 10/14/2013   LDLCALC 141 (H) 10/14/2013   LDLCALC (H) 12/24/2010    156        Total Cholesterol/HDL:CHD Risk Coronary Heart Disease Risk Table                     Men   Women  1/2 Average Risk   3.4   3.3  Average Risk       5.0   4.4  2 X Average Risk   9.6   7.1  3 X Average Risk  23.4   11.0        Use the calculated Patient Ratio above and the CHD Risk Table to determine the patient's CHD Risk.        ATP III CLASSIFICATION (LDL):  <100     mg/dL   Optimal  960-454  mg/dL   Near or Above                    Optimal  130-159  mg/dL   Borderline  098-119  mg/dL   High  >147     mg/dL   Very High    Physical Findings: AIMS:  , ,  ,  ,    CIWA:  CIWA-Ar Total: 0 COWS:     Musculoskeletal: Strength & Muscle Tone: within normal limits Gait & Station: normal Patient leans: N/A  Psychiatric Specialty Exam:  General Appearance: Casually dressed, slightly unkempt  Behavior: Cooperative and pleasant  Psychomotor Activity:No psychomotor agitation or retardation noted   Eye Contact: Fair Speech: Normal amount, normal tone and volume   Mood: Mildly anxious but no dysphoric mood noted Affect: Congruent  Thought Process: Linear and goal directed Descriptions  of Associations: Intact yet concrete Thought Content: Hallucinations: Denies AH, VH  Delusions: No paranoia  Suicidal Thoughts: Denies passive or active SI, intention, plan  Homicidal Thoughts: Denies HI, intention, plan   Alertness/Orientation: Alert and oriented  Insight: Improved yet limited Judgment: Improved yet limited  Memory: Fair  Art therapist  Concentration: Fair Attention Span: Fair Recall: [employer] of Knowledge:  Fair   Assets  Assets: Manufacturing systems engineer; Desire for Improvement; Financial Resources/Insurance; Housing; Resilience; Social Support    Physical Exam: Physical Exam Vitals and nursing note reviewed.    Review of Systems  All other systems reviewed and are negative.  Blood pressure 107/73, pulse 75, temperature 97.9 F (36.6 C), temperature source Oral, resp. rate 18, height 5\' 10"  (1.778 m), weight 64.9 kg, SpO2 97%. Body mass index is 20.52 kg/m.   Treatment Plan Summary: Daily contact with patient to assess and evaluate symptoms and progress in treatment and Medication management  ASSESSMENT:  Diagnoses / Active Problems: Principal Problem: Adjustment disorder with mixed anxiety and depressed mood Diagnosis: Principal Problem:   Adjustment disorder with mixed anxiety and depressed mood Active Problems:   Alcohol use disorder, severe, dependence (HCC)   PLAN: Safety and Monitoring:  -- Involuntary admission to inpatient psychiatric unit for safety, stabilization and treatment  -- Daily contact with patient to assess and evaluate symptoms and progress in treatment  -- Patient's case to be discussed in multi-disciplinary team meeting  -- Observation Level : q15 minute checks  -- Vital signs:  q12 hours  -- Precautions: suicide, elopement, and assault  2. Medications:   Patient refusing to start daily SSRI for treatment of adjustment disorder/depression or anxiety  Continue Vistaril as needed for anxiety  Continue trazodone as needed for sleep   -- The risks/benefits/side-effects/alternatives to this medication were discussed in detail with the patient and time was given for questions. The patient consents to medication trial.      3. Pertinent labs: CMP no significant abnormalities noted, CBC with no significant abnormalities noted, TSH 1/17 within normal level, alcohol level at time of admission elevated 12, UDS at time of admission positive to marijuana       Lab ordered: None   4. Tobacco Use Disorder  -- Nicotine patch 21mg /24 hours ordered  -- Smoking cessation encouraged  5. Group and Therapy: -- Encouraged patient to participate in unit milieu and in scheduled group therapies     Patient was counseled regarding need to abstain from marijuana after discharge, he has very limited insight to negative influence of marijuana on mental health and need to abstain.  -- Short Term Goals: Ability to identify changes in lifestyle to reduce recurrence of condition will improve, Ability to verbalize feelings will improve, Ability to disclose and discuss suicidal ideas, and Ability to demonstrate self-control will improve  -- Long Term Goals: Improvement in symptoms so as ready for discharge  6. Discharge Planning:   -- Social work and case management to assist with discharge planning and identification of hospital follow-up needs prior to discharge  -- Estimated LOS: 5-7 days  -- Discharge Concerns: Need to establish a safety plan; Medication compliance and effectiveness  -- Discharge Goals: Return home with outpatient referrals for mental health follow-up including medication management/psychotherapy      Total Time Spent in Direct Patient Care:  I personally spent 35 minutes on the unit in direct patient care. The direct patient care time included face-to-face time with the patient, reviewing the patient's chart, communicating with  other professionals, and coordinating care. Greater than 50% of this time was spent in counseling or coordinating care with the patient regarding goals of hospitalization, psycho-education, and discharge planning needs.   Giorgio Chabot Abbott Pao, MD 10/28/2023, 11:34 AM

## 2023-10-28 NOTE — Group Note (Signed)
Date:  10/28/2023 Time:  10:34 AM  Group Topic/Focus:  Goals Group:   The focus of this group is to help patients establish daily goals to achieve during treatment and discuss how the patient can incorporate goal setting into their daily lives to aide in recovery. Orientation:   The focus of this group is to educate the patient on the purpose and policies of crisis stabilization and provide a format to answer questions about their admission.  The group details unit policies and expectations of patients while admitted.    Participation Level:  Active  Participation Quality:  Appropriate  Affect:  Appropriate  Cognitive:  Appropriate  Insight: Appropriate  Engagement in Group:  Engaged  Modes of Intervention:  Discussion, Orientation, and Rapport Building  Additional Comments:   Pt attended and participated in the Orientation/Goals group. Pt personal goal for today is to stop over thinking so much.  Edmund Hilda Adja Ruff 10/28/2023, 10:34 AM

## 2023-10-28 NOTE — Progress Notes (Signed)
   10/28/23 0815  Psych Admission Type (Psych Patients Only)  Admission Status Involuntary  Psychosocial Assessment  Patient Complaints Loneliness;Substance abuse  Eye Contact Fair  Facial Expression Anxious;Worried;Sad  Affect Appropriate to circumstance;Anxious  Speech Logical/coherent  Interaction Assertive  Motor Activity Other (Comment) (WDL)  Appearance/Hygiene Unremarkable  Behavior Characteristics Cooperative;Appropriate to situation  Mood Depressed;Anxious  Thought Process  Coherency WDL  Content WDL  Delusions None reported or observed  Perception WDL  Hallucination None reported or observed  Judgment WDL  Confusion None  Danger to Self  Current suicidal ideation? Denies  Agreement Not to Harm Self Yes  Description of Agreement Verbal  Danger to Others  Danger to Others None reported or observed

## 2023-10-28 NOTE — Progress Notes (Signed)
   10/28/23 2000  Psych Admission Type (Psych Patients Only)  Admission Status Involuntary  Psychosocial Assessment  Patient Complaints Loneliness;Substance abuse  Eye Contact Fair  Facial Expression Anxious;Worried;Sad  Affect Anxious;Apprehensive;Depressed  Speech Logical/coherent  Interaction Assertive  Motor Activity Other (Comment) (WDL)  Appearance/Hygiene Unremarkable  Behavior Characteristics Cooperative;Appropriate to situation  Mood Depressed;Anxious  Thought Process  Coherency WDL  Content WDL  Delusions None reported or observed  Perception WDL  Hallucination None reported or observed  Judgment WDL  Confusion None  Danger to Self  Current suicidal ideation? Denies  Agreement Not to Harm Self Yes  Description of Agreement verbal  Danger to Others  Danger to Others None reported or observed

## 2023-10-28 NOTE — [facility] Group Notes (Signed)
Adult Psychoeducational Group Note  Date:  10/28/2023 Time:  9:19 PM  Group Topic/Focus:  Wrap-Up Group:   The focus of this group is to help patients review their daily goal of treatment and discuss progress on daily workbooks.  Participation Level:  Active  Participation Quality:  Appropriate  Affect:  Appropriate  Cognitive:  Appropriate  Insight: Appropriate  Engagement in Group:  Engaged  Modes of Intervention:  Discussion  Additional Comments:  [name] attend wrap up group  [name] 10/28/2023, 9:19 PM

## 2023-10-28 NOTE — [facility] Suicide Risk Assessment (Addendum)
[facility] INPATIENT:  Family/Significant Other Suicide Prevention Education  Suicide Prevention Education:  Education Completed; Javares Carlise (son) [phone] ,  (name of family member/significant other) has been identified by the patient as the family member/significant other with whom the patient will be residing, and identified as the person(s) who will aid the patient in the event of a mental health crisis (suicidal ideations/suicide attempt).  With written consent from the patient, the family member/significant other has been provided the following suicide prevention education, prior to the and/or following the discharge of the patient.  The suicide prevention education provided includes the following: Suicide risk factors Suicide prevention and interventions National Suicide Hotline telephone number [facility] assessment telephone number [facility] Emergency Assistance 911 [facility] and/or Residential Mobile Crisis Unit telephone number  Request made of family/significant other to: Remove weapons (e.g., guns, rifles, knives), all items previously/currently identified as safety concern.   Remove drugs/medications (over-the-counter, prescriptions, illicit drugs), all items previously/currently identified as a safety concern.  The family member/significant other verbalizes understanding of the suicide prevention education information provided.  The family member/significant other agrees to remove the items of safety concern listed above.  Steffanie Dunn LCSWA 10/28/2023, 2:32 PM

## 2023-10-28 NOTE — BH IP Treatment Plan (Signed)
Interdisciplinary Treatment and Diagnostic Plan Update  10/28/2023 Time of Session: 10:40AM KASTLE MCCLARAN MRN: [id number]  Principal Diagnosis: Adjustment disorder with mixed anxiety and depressed mood  Secondary Diagnoses: Principal Problem:   Adjustment disorder with mixed anxiety and depressed mood Active Problems:   Alcohol use disorder, severe, dependence (HCC)   Current Medications:  Current Facility-Administered Medications  Medication Dose Route Frequency Provider Last Rate Last Admin   acetaminophen (TYLENOL) tablet 650 mg  650 mg Oral Q6H PRN Dahlia Byes C, NP   650 mg at 10/28/23 1042   alum & mag hydroxide-simeth (MAALOX/MYLANTA) 200-200-20 MG/5ML suspension 30 mL  30 mL Oral Q4H PRN Onuoha, Josephine C, NP       bisacodyl (DULCOLAX) EC tablet 5 mg  5 mg Oral Daily PRN Dahlia Byes C, NP       haloperidol (HALDOL) tablet 5 mg  5 mg Oral TID PRN Dahlia Byes C, NP       And   diphenhydrAMINE (BENADRYL) capsule 50 mg  50 mg Oral TID PRN Dahlia Byes C, NP       haloperidol lactate (HALDOL) injection 5 mg  5 mg Intramuscular TID PRN Dahlia Byes C, NP       And   diphenhydrAMINE (BENADRYL) injection 50 mg  50 mg Intramuscular TID PRN Dahlia Byes C, NP       And   LORazepam (ATIVAN) injection 2 mg  2 mg Intramuscular TID PRN Dahlia Byes C, NP       haloperidol lactate (HALDOL) injection 10 mg  10 mg Intramuscular TID PRN Dahlia Byes C, NP       And   diphenhydrAMINE (BENADRYL) injection 50 mg  50 mg Intramuscular TID PRN Dahlia Byes C, NP       And   LORazepam (ATIVAN) injection 2 mg  2 mg Intramuscular TID PRN Earney Navy, NP       feeding supplement (ENSURE ENLIVE / ENSURE PLUS) liquid 237 mL  237 mL Oral BID BM Massengill, Harrold Donath, MD   237 mL at 10/28/23 1033   gabapentin (NEURONTIN) capsule 300 mg  300 mg Oral TID Dahlia Byes C, NP   300 mg at 10/28/23 1251   guaiFENesin (ROBITUSSIN) 100 MG/5ML liquid 5  mL  5 mL Oral Q4H PRN Dahlia Byes C, NP       hydrALAZINE (APRESOLINE) injection 10 mg  10 mg Intravenous Q4H PRN Dahlia Byes C, NP       hydrOXYzine (ATARAX) tablet 25 mg  25 mg Oral TID PRN Meryl Dare, MD   25 mg at 10/28/23 1042   ipratropium-albuterol (DUONEB) 0.5-2.5 (3) MG/3ML nebulizer solution 3 mL  3 mL Nebulization Q4H PRN Onuoha, Josephine C, NP       LORazepam (ATIVAN) tablet 1 mg  1 mg Oral Q6H PRN Meryl Dare, MD       Or   LORazepam (ATIVAN) injection 1 mg  1 mg Intramuscular Q6H PRN Meryl Dare, MD       LORazepam (ATIVAN) injection 2 mg  2 mg Intravenous Q4H PRN Onuoha, Josephine C, NP       magnesium hydroxide (MILK OF MAGNESIA) suspension 30 mL  30 mL Oral Daily PRN Dahlia Byes C, NP       metoprolol tartrate (LOPRESSOR) injection 5 mg  5 mg Intravenous Q4H PRN Onuoha, Josephine C, NP       nicotine (NICODERM CQ - dosed in mg/24 hours) patch 21 mg  21 mg Transdermal Daily  Phineas Inches, MD   21 mg at 10/28/23 0865   nicotine polacrilex (NICORETTE) gum 2 mg  2 mg Oral PRN Massengill, Harrold Donath, MD       thiamine (Vitamin B-1) tablet 100 mg  100 mg Oral Daily Dahlia Byes C, NP   100 mg at 10/28/23 7846   Or   thiamine (VITAMIN B1) injection 100 mg  100 mg Intravenous Daily Dahlia Byes C, NP       traZODone (DESYREL) tablet 50 mg  50 mg Oral QHS PRN Meryl Dare, MD   50 mg at 10/27/23 2233   PTA Medications: Medications Prior to Admission  Medication Sig Dispense Refill Last Dose/Taking   acetaminophen (TYLENOL) 500 MG tablet Take 500 mg by mouth daily as needed for mild pain (pain score 1-3) or moderate pain (pain score 4-6).      folic acid (FOLVITE) 1 MG tablet Take 1 tablet (1 mg total) by mouth daily. (Patient not taking: Reported on 10/22/2023) 30 tablet 0    gabapentin (NEURONTIN) 300 MG capsule Take 300 mg by mouth 3 (three) times daily. (Patient not taking: Reported on 10/22/2023)      Multiple Vitamin (MULTIVITAMIN WITH  MINERALS) TABS tablet Take 1 tablet by mouth daily. (Patient not taking: Reported on 10/22/2023) 130 tablet 0    thiamine (VITAMIN B-1) 100 MG tablet Take 1 tablet (100 mg total) by mouth daily. (Patient not taking: Reported on 10/22/2023) 30 tablet 0     Patient Stressors: Health problems   Medication change or noncompliance   Substance abuse    Patient Strengths: Ability for insight  Active sense of humor  Average or above average intelligence  Capable of independent living  Arboriculturist fund of knowledge  Motivation for treatment/growth  Physical Health  Supportive family/friends  Work skills   Treatment Modalities: Medication Management, Group therapy, Case management,  1 to 1 session with clinician, Psychoeducation, Recreational therapy.   Physician Treatment Plan for Primary Diagnosis: Adjustment disorder with mixed anxiety and depressed mood Long Term Goal(s): Improvement in symptoms so as ready for discharge   Short Term Goals: Ability to identify changes in lifestyle to reduce recurrence of condition will improve Ability to verbalize feelings will improve Ability to disclose and discuss suicidal ideas Ability to demonstrate self-control will improve  Medication Management: Evaluate patient's response, side effects, and tolerance of medication regimen.  Therapeutic Interventions: 1 to 1 sessions, Unit Group sessions and Medication administration.  Evaluation of Outcomes: Not Progressing  Physician Treatment Plan for Secondary Diagnosis: Principal Problem:   Adjustment disorder with mixed anxiety and depressed mood Active Problems:   Alcohol use disorder, severe, dependence (HCC)  Long Term Goal(s): Improvement in symptoms so as ready for discharge   Short Term Goals: Ability to identify changes in lifestyle to reduce recurrence of condition will improve Ability to verbalize feelings will improve Ability to disclose and discuss  suicidal ideas Ability to demonstrate self-control will improve     Medication Management: Evaluate patient's response, side effects, and tolerance of medication regimen.  Therapeutic Interventions: 1 to 1 sessions, Unit Group sessions and Medication administration.  Evaluation of Outcomes: Not Progressing   RN Treatment Plan for Primary Diagnosis: Adjustment disorder with mixed anxiety and depressed mood Long Term Goal(s): Knowledge of disease and therapeutic regimen to maintain health will improve  Short Term Goals: Ability to remain free from injury will improve, Ability to verbalize frustration and anger appropriately will improve, Ability to demonstrate  self-control, Ability to participate in decision making will improve, Ability to verbalize feelings will improve, Ability to disclose and discuss suicidal ideas, Ability to identify and develop effective coping behaviors will improve, and Compliance with prescribed medications will improve  Medication Management: RN will administer medications as ordered by provider, will assess and evaluate patient's response and provide education to patient for prescribed medication. RN will report any adverse and/or side effects to prescribing provider.  Therapeutic Interventions: 1 on 1 counseling sessions, Psychoeducation, Medication administration, Evaluate responses to treatment, Monitor vital signs and CBGs as ordered, Perform/monitor CIWA, COWS, AIMS and Fall Risk screenings as ordered, Perform wound care treatments as ordered.  Evaluation of Outcomes: Not Progressing   LCSW Treatment Plan for Primary Diagnosis: Adjustment disorder with mixed anxiety and depressed mood Long Term Goal(s): Safe transition to appropriate next level of care at discharge, Engage patient in therapeutic group addressing interpersonal concerns.  Short Term Goals: Engage patient in aftercare planning with referrals and resources, Increase social support, Increase ability  to appropriately verbalize feelings, Increase emotional regulation, Facilitate acceptance of mental health diagnosis and concerns, Facilitate patient progression through stages of change regarding substance use diagnoses and concerns, Identify triggers associated with mental health/substance abuse issues, and Increase skills for wellness and recovery  Therapeutic Interventions: Assess for all discharge needs, 1 to 1 time with Social worker, Explore available resources and support systems, Assess for adequacy in community support network, Educate family and significant other(s) on suicide prevention, Complete Psychosocial Assessment, Interpersonal group therapy.  Evaluation of Outcomes: Not Progressing   Progress in Treatment: Attending groups: Yes. Participating in groups: Yes. Taking medication as prescribed: Yes. Toleration medication: Yes. Family/Significant other contact made: No, will contact:  Hazle Nordmann (son) [phone] only Patient understands diagnosis: Yes. Discussing patient identified problems/goals with staff: Yes. Medical problems stabilized or resolved: Yes. Denies suicidal/homicidal ideation: Yes. Issues/concerns per patient self-inventory: No.  New problem(s) identified: No, Describe:  none reported  New Short Term/Long Term Goal(s): detox, medication management for mood stabilization; elimination of SI thoughts; development of comprehensive mental wellness/sobriety plan   Patient Goals:  "Maintain vistaril and follow up at discharge at [place]"   Discharge Plan or Barriers: Patient recently admitted. CSW will continue to follow and assess for appropriate referrals and possible discharge planning.    Reason for Continuation of Hospitalization: Anxiety Depression Medication stabilization Suicidal ideation  Estimated Length of Stay: 5-7 days  Last 3 [place] Suicide Severity Risk Score: Flowsheet Row [facility] (Current[facility]Ad[facility] (Discharged) from 10/22/2023 in [facility] ED from 06/18/2023 in [facility] Emergency Department [facility]  C-SSRS RISK CATEGORY High Risk High Risk No Risk       Last PHQ 2/9 Scores:     No data to display          Scribe for Treatment Team: Kathi Der, LCSWA 10/28/2023 1:09 PM

## 2023-10-28 NOTE — Group Note (Signed)
Date:  10/28/2023 Time:  4:44 PM  Group Topic/Focus:  Dimensions of Wellness:   The focus of this group is to introduce the topic of wellness and discuss the role each dimension of wellness plays in total health.    Participation Level:  Active  Participation Quality:  Appropriate  Affect:  Appropriate  Cognitive:  Appropriate  Insight: Appropriate  Engagement in Group:  Improving  Modes of Intervention:  Discussion and Education  Additional Comments:   Pt attended and participated in the Physical Wellness education group. Pt was calm, cooperative and attentive throughout the group.  [name] Daily 10/28/2023, 4:44 PM

## 2023-10-28 NOTE — Progress Notes (Signed)
   10/28/23 0200  Psych Admission Type (Psych Patients Only)  Admission Status Involuntary  Psychosocial Assessment  Patient Complaints Loneliness;Substance abuse  Eye Contact Fair  Facial Expression Anxious;Worried;Sad  Affect Anxious;Apprehensive;Depressed  Speech Logical/coherent  Interaction Assertive  Motor Activity Other (Comment) (WDL)  Appearance/Hygiene Unremarkable  Behavior Characteristics Cooperative;Appropriate to situation  Mood Depressed;Anxious  Thought Process  Coherency WDL  Content WDL  Delusions None reported or observed  Perception WDL  Hallucination None reported or observed  Judgment WDL  Confusion None  Danger to Self  Current suicidal ideation? Denies  Agreement Not to Harm Self Yes  Description of Agreement verbal  Danger to Others  Danger to Others None reported or observed

## 2023-10-28 NOTE — Plan of Care (Signed)
?  Problem: Education: ?Goal: Mental status will improve ?Outcome: Progressing ?Goal: Verbalization of understanding the information provided will improve ?Outcome: Progressing ?  ?

## 2023-10-29 DIAGNOSIS — F1094 Alcohol use, unspecified with alcohol-induced mood disorder: Secondary | ICD-10-CM

## 2023-10-29 MED ORDER — NICOTINE 21 MG/24HR TD PT24: 21.0000 mg | MEDICATED_PATCH | Freq: Every day | TRANSDERMAL | 0 refills | Status: DC

## 2023-10-29 MED ORDER — GABAPENTIN 300 MG PO CAPS: 300.0000 mg | ORAL_CAPSULE | Freq: Three times a day (TID) | ORAL | 0 refills | Status: DC

## 2023-10-29 MED ORDER — HYDROXYZINE HCL 25 MG PO TABS: 25.0000 mg | ORAL_TABLET | Freq: Three times a day (TID) | ORAL | 0 refills | Status: DC | PRN

## 2023-10-29 MED ORDER — TRAZODONE HCL 50 MG PO TABS: 50.0000 mg | ORAL_TABLET | Freq: Every evening | ORAL | 0 refills | Status: DC | PRN

## 2023-10-29 NOTE — Plan of Care (Signed)
  Problem: Education: Goal: Knowledge of [facility] Education information/materials will improve Outcome: Progressing Goal: Emotional status will improve Outcome: Progressing Goal: Mental status will improve Outcome: Progressing Goal: Verbalization of understanding the information provided will improve Outcome: Progressing   

## 2023-10-29 NOTE — Progress Notes (Signed)
Patient is discharging at this time. Patient is A&Ox4. Stable. Patient denies SI,HI, and A/V/H with no plan/intent. Printed AVS reviewed with and given to patient along with medications and follow up appointments. Suicide safety plan complete with copy provided to patient. Original form in chart. Survey complete. Patient verbalized all understanding. All valuables/belongings returned to patient. Patient is being transported by his girlfriend. Patient denies any pain/discomfort. No s/s of current distress.

## 2023-10-29 NOTE — BHH Suicide Risk Assessment (Signed)
[facility] Discharge Suicide Risk Assessment   Principal Problem: Adjustment disorder with mixed anxiety and depressed mood Discharge Diagnoses: Principal Problem:   Adjustment disorder with mixed anxiety and depressed mood Active Problems:   Alcohol use disorder, severe, dependence (HCC)   Total Time spent with patient: 45 minutes  Reason for admission: "I don't need to be here," concern for suicidal thoughts after telling security guard to "just shoot me"    [name] is a 53 y.o. male  with a past psychiatric history of PTSD, alcohol use disorder, and numerous hospitalizations for alcohol withdrawal and substance rehab. Patient initially arrived to [facility] on 10/22/23 for alcohol intoxication, and admitted to [facility] under IVC on 10/26/23 for acute safety concerns.   PTA Medications:  Medications Prior to Admission  Medication Sig Dispense Refill Last Dose/Taking   acetaminophen (TYLENOL) 500 MG tablet Take 500 mg by mouth daily as needed for mild pain (pain score 1-3) or moderate pain (pain score 4-6).         folic acid (FOLVITE) 1 MG tablet Take 1 tablet (1 mg total) by mouth daily. (Patient not taking: Reported on 10/22/2023) 30 tablet 0     gabapentin (NEURONTIN) 300 MG capsule Take 300 mg by mouth 3 (three) times daily. (Patient not taking: Reported on 10/22/2023)         Multiple Vitamin (MULTIVITAMIN WITH MINERALS) TABS tablet Take 1 tablet by mouth daily. (Patient not taking: Reported on 10/22/2023) 130 tablet 0     thiamine (VITAMIN B-1) 100 MG tablet Take 1 tablet (100 mg total) by mouth daily. (Patient not taking: Reported on 10/22/2023) 30 tablet 0      Hospital Course:   During the patient's hospitalization, patient had extensive initial psychiatric evaluation, and follow-up psychiatric evaluations every day.  Psychiatric diagnoses provided upon initial assessment: Principal Problem:   Adjustment disorder with mixed anxiety and depressed mood PTSD Active Problems:   Alcohol  use disorder, severe, dependence (HCC)  Patient's psychiatric medications were adjusted on admission: Patient refused to be started on a scheduled SSRI despite the fact that I use discussed with him clearly benefit in the long-term.  He did agree to be restarted on gabapentin for long-term management of anxiety and mood 300 mg 3 times daily, was also started on trazodone 50 mg at bedtime as needed for sleep and hydroxyzine 25 mg 3 times daily as needed for anxiety  During the hospitalization, other adjustments were made to the patient's psychiatric medication regimen: Medications were continued as above without any adjustment, patient did continue to refuse starting SSRI/Prozac for management of mood and depression and also refused to start prazosin for management of nightmares related to PTSD. Patient did use trazodone and hydroxyzine as needed for sleep and anxiety during hospital stay and reported good efficacy to both and tolerated both very well without reporting any side effects. Patient's care was discussed during the interdisciplinary team meeting every day during the hospitalization.  The patient denied having side effects to prescribed psychiatric medication.   He consistently reported during hospital stay that his suicidal statement was mainly during being intoxicated with alcohol and he did not mean harming himself, he denied passive or active SI all through hospital stay, he was counseled regarding need to abstain completely from alcohol use and he agreed.   Gradually, patient started adjusting to milieu. The patient was evaluated each day by a clinical provider to ascertain response to treatment. Improvement was noted by the patient's report of decreasing  symptoms, improved sleep and appetite, affect, medication tolerance, behavior, and participation in unit programming.  Patient was asked each day to complete a self inventory noting mood, mental status, pain, new symptoms, anxiety and  concerns.    Symptoms were reported as significantly decreased or resolved completely by discharge.  All through hospital stay patient did not display any sign of withdrawal and did not report any symptom of withdrawal, denied alcohol craving, he was counseled several times regarding need to abstain completely from alcohol use after discharge, he reported working with Child psychotherapist at [place] to place him to rehab treatment in [place] where he has significant interest to joint. On day of discharge, patient was evaluated on 10/29/2023 the patient reports that their mood is stable. The patient denied having suicidal thoughts for more than 48 hours prior to discharge.  Patient denies having homicidal thoughts.  Patient denies having auditory hallucinations.  Patient denies any visual hallucinations or other symptoms of psychosis. The patient was motivated to continue taking medication with a goal of continued improvement in mental health.   The patient reports their target psychiatric symptoms of depression and anxiety responded well to the psychiatric medications, and the patient reports overall benefit other psychiatric hospitalization. Supportive psychotherapy was provided to the patient. The patient also participated in regular group therapy while hospitalized. Coping skills, problem solving as well as relaxation therapies were also part of the unit programming.  Labs were reviewed with the patient, and abnormal results were discussed with the patient.  The patient is able to verbalize their individual safety plan to this provider.  Behavioral Events: None  Restraints: None  Groups: Attended and participated  Medications Changes: As above  Sleep  Fair, improved during hospital stay   Musculoskeletal: Strength & Muscle Tone: within normal limits Gait & Station: normal Patient leans: N/A  Psychiatric Specialty Exam  General Appearance: appears at stated age, fairly dressed  and groomed  Behavior: pleasant and cooperative  Psychomotor Activity:No psychomotor agitation or retardation noted   Eye Contact: good Speech: normal amount, tone, volume and latency   Mood: euthymic Affect: congruent, pleasant and interactive  Thought Process: linear, goal directed, no circumstantial or tangential thought process noted, no racing thoughts or flight of ideas Descriptions of Associations: intact Thought Content: Hallucinations: denies AH, VH , does not appear responding to stimuli Delusions: No paranoia or other delusions noted Suicidal Thoughts: denies SI, intention, plan  Homicidal Thoughts: denies HI, intention, plan   Alertness/Orientation: alert and fully oriented  Insight: fair, improved Judgment: fair, improved  Memory: intact  Executive Functions  Concentration: intact  Attention Span: Fair Recall: intact Fund of Knowledge: fair   Art therapist  Concentration: intact Attention Span: Fair Recall: intact Fund of Knowledge: fair   Assets  Assets: Manufacturing systems engineer; Desire for Improvement; Financial Resources/Insurance; Housing; Resilience; Social Support   Physical Exam: Physical Exam ROS Blood pressure 116/79, pulse 78, temperature 97.7 F (36.5 C), temperature source Oral, resp. rate 18, height 5\' 10"  (1.778 m), weight 64.9 kg, SpO2 99%. Body mass index is 20.52 kg/m.  Mental Status Per Nursing Assessment::   On Admission:  NA  Demographic Factors:  Male, Caucasian, Living alone, and Unemployed  Loss Factors: NA  Historical Factors: NA  Risk Reduction Factors:   NA  Continued Clinical Symptoms: Symptoms improved significantly during hospital stay Depression:   Hopelessness Insomnia  Cognitive Features That Contribute To Risk:  None    Suicide Risk:  Minimal: No identifiable suicidal ideation.  Patients presenting with no risk factors but with morbid ruminations; may be classified as minimal risk based on the  severity of the depressive symptoms   Follow-up Information     [facility] up on 11/02/2023.   Why: You have a behavioral health nurse assessment appointment on 11/02/23 at 1:00 pm, phone call.  You also have a hospital follow up appointment for therapy and medication management services on  11/11/23 at 1:30 pm, in person. [facility] information: [address] [place] [address] [phone]                 Plan Of Care/Follow-up recommendations:    Discharge recommendations:     Activity: as tolerated  Diet: heart healthy  # It is recommended to the patient to continue psychiatric medications as prescribed, after discharge from the hospital.     # It is recommended to the patient to follow up with your outpatient psychiatric provider and PCP.   # It was discussed with the patient, the impact of alcohol, drugs, tobacco have been there overall psychiatric and medical wellbeing, and total abstinence from substance use was recommended the patient.ed.   # Prescriptions provided or sent directly to preferred pharmacy at discharge. Patient agreeable to plan. Given opportunity to ask questions. Appears to feel comfortable with discharge.    # In the event of worsening symptoms, the patient is instructed to call the crisis hotline, 911 and or go to the nearest ED for appropriate evaluation and treatment of symptoms. To follow-up with primary care provider for other medical issues, concerns and or health care needs   # Patient was discharged home with a plan to follow up as noted above.  -Follow-up with outpatient primary care doctor and other specialists -for management of chronic medical disease, including: Patient was recommended to establish care with primary care provider at the [facility] for long-term care, he agrees.   Patient agrees with D/C instructions and plan.  The patient received suicide prevention pamphlet:  Yes Belongings  returned:  Clothing and Valuables  Total Time Spent in Direct Patient Care:  I personally spent 45 minutes on the unit in direct patient care. The direct patient care time included face-to-face time with the patient, reviewing the patient's chart, communicating with other professionals, and coordinating care. Greater than 50% of this time was spent in counseling or coordinating care with the patient regarding goals of hospitalization, psycho-education, and discharge planning needs.   [name] Abbott Pao 10/29/2023, 9:23 AM   Evelia Waskey Abbott Pao, MD 10/29/2023, 9:23 AM

## 2023-10-29 NOTE — Discharge Summary (Signed)
Physician Discharge Summary Note  Patient:  [name] is an 53 y.o., male MRN:  [id number] DOB:  Sep 10, 1971 Patient phone:  [phone] (home)  Patient address:   [address] [place] [address],  Total Time spent with patient: 45 minutes  Date of Admission:  10/26/2023 Date of Discharge: 10/29/2023  Reason for Admission:  "I don't need to be here," concern for suicidal thoughts after telling security guard to "just shoot me"    [name] is a 53 y.o. male  with a past psychiatric history of PTSD, alcohol use disorder, and numerous hospitalizations for alcohol withdrawal and substance rehab. Patient initially arrived to [facility] on 10/22/23 for alcohol intoxication, and admitted to [facility] under IVC on 10/26/23 for acute safety concerns.   Principal Problem: Adjustment disorder with mixed anxiety and depressed mood Discharge Diagnoses: Principal Problem:   Adjustment disorder with mixed anxiety and depressed mood Active Problems:   Alcohol use disorder, severe, dependence (HCC)   Past Psychiatric History:  Current Psychiatrist: None [facility] Previous Psychiatric Diagnoses: PTSD, alcohol use disorder Psychiatric Medications: Current None Past Paroxetine and venlafaxine but reports these made things worse Psychiatric Hospitalization hx: Numerous hospitalizations for substance use disorder but no other reasons. Psychotherapy hx: Therapy at [facility] but does not describe what program Neuromodulation history: Denies History of suicide: Denies History of homicide or aggression: Denies  Past Medical History:  Past Medical History:  Diagnosis Date   Active smoker    Alcohol abuse    Anxiety    Bipolar 2 disorder (HCC)    Depression    PTSD (post-traumatic stress disorder)     Past Surgical History:  Procedure Laterality Date   HERNIA REPAIR     LYMPH NODE DISSECTION      Family History:  Family History  Problem  Relation Age of Onset   Alcohol abuse Father    Family Psychiatric  History:  Psychiatric Dx: PTSD in father Suicide Hx: Denies Violence/Aggression: Denies Substance use: Yes in first-degree relatives Social History:  Social History   Substance and Sexual Activity  Alcohol Use Not Currently   Comment: Patient states he is a binge drinker. Last drink 06/09/23     Social History   Substance and Sexual Activity  Drug Use Yes   Types: Marijuana    Social History   Socioeconomic History   Marital status: Legally Separated    Spouse name: Not on file   Number of children: Not on file   Years of education: Not on file   Highest education level: Not on file  Occupational History   Not on file  Tobacco Use   Smoking status: Every Day    Current packs/day: 2.00    Average packs/day: 2.0 packs/day for 20.0 years (40.0 ttl pk-yrs)    Types: Cigarettes   Smokeless tobacco: Never  Vaping Use   Vaping status: Never Used  Substance and Sexual Activity   Alcohol use: Not Currently    Comment: Patient states he is a binge drinker. Last drink 06/09/23   Drug use: Yes    Types: Marijuana   Sexual activity: Yes  Other Topics Concern   Not on file  Social History Narrative   Has an ex-wife and 21 yo son whom he cannot see now, as his ex-wife divorced him. Was in the [employer] and went to [place], [place], and may have some PTSD symptoms from this. Was also a Management consultant. Heavy alcohol abuse  during binges, but has had periods of sobriety for months on end. Has been drinking up to two cases of beer a day   Social Drivers of Corporate investment banker Strain: Not on file  Food Insecurity: No Food Insecurity (10/26/2023)   Hunger Vital Sign    Worried About Running Out of Food in the Last Year: Never true    Ran Out of Food in the Last Year: Never true  Recent Concern: Food Insecurity - Food Insecurity Present (10/23/2023)   Hunger Vital Sign    Worried About Running Out of Food in the  Last Year: Sometimes true    Ran Out of Food in the Last Year: Sometimes true  Transportation Needs: No Transportation Needs (10/26/2023)   PRAPARE - Administrator, Civil Service (Medical): No    Lack of Transportation (Non-Medical): No  Physical Activity: Not on file  Stress: Not on file  Social Connections: Unknown (10/26/2023)   Social Connection and Isolation Panel [NHANES]    Frequency of Communication with Friends and Family: Not on file    Frequency of Social Gatherings with Friends and Family: Not on file    Attends Religious Services: Not on Marketing executive or Organizations: Not on file    Attends Banker Meetings: Not on file    Marital Status: Divorced   Current Living Situation: Lives at his home which he owns Education: High school and 1 year of college Occupational hx: Hotel manager, retired Marital Status: Single, was in relationship but broke up Children: Son who he is close with (collateral) Legal: Denies Military: Yes   Access to firearms: Patient denies and son also denies  Substance Use History:  Alcohol: Significant alcohol use, numerous hospitalizations and rehabs, continued use and binge drinks Tobacco: Smokes cigarettes but does not quantify Cannabis: Reports occasional cannabis use because it "helps with nightmares." Other Illicit drugs: Denies Rx drug abuse: Denies Rehab hx: Long history of alcohol rehab  Hospital Course:   During the patient's hospitalization, patient had extensive initial psychiatric evaluation, and follow-up psychiatric evaluations every day.   Psychiatric diagnoses provided upon initial assessment: Principal Problem:   Adjustment disorder with mixed anxiety and depressed mood PTSD Active Problems:   Alcohol use disorder, severe, dependence (HCC)   Patient's psychiatric medications were adjusted on admission: Patient refused to be started on a scheduled SSRI despite the fact that I use discussed  with him clearly benefit in the long-term.  He did agree to be restarted on gabapentin for long-term management of anxiety and mood 300 mg 3 times daily, was also started on trazodone 50 mg at bedtime as needed for sleep and hydroxyzine 25 mg 3 times daily as needed for anxiety   During the hospitalization, other adjustments were made to the patient's psychiatric medication regimen: Medications were continued as above without any adjustment, patient did continue to refuse starting SSRI/Prozac for management of mood and depression and also refused to start prazosin for management of nightmares related to PTSD. Patient did use trazodone and hydroxyzine as needed for sleep and anxiety during hospital stay and reported good efficacy to both and tolerated both very well without reporting any side effects. Patient's care was discussed during the interdisciplinary team meeting every day during the hospitalization.   The patient denied having side effects to prescribed psychiatric medication.     He consistently reported during hospital stay that his suicidal statement was mainly during being intoxicated with  alcohol and he did not mean harming himself, he denied passive or active SI all through hospital stay, he was counseled regarding need to abstain completely from alcohol use and he agreed.     Gradually, patient started adjusting to milieu. The patient was evaluated each day by a clinical provider to ascertain response to treatment. Improvement was noted by the patient's report of decreasing symptoms, improved sleep and appetite, affect, medication tolerance, behavior, and participation in unit programming.  Patient was asked each day to complete a self inventory noting mood, mental status, pain, new symptoms, anxiety and concerns.     Symptoms were reported as significantly decreased or resolved completely by discharge.  All through hospital stay patient did not display any sign of withdrawal and did  not report any symptom of withdrawal, denied alcohol craving, he was counseled several times regarding need to abstain completely from alcohol use after discharge, he reported working with Child psychotherapist at [facility] [place] to place him to rehab treatment in [place] where he has significant interest to joint. On day of discharge, patient was evaluated on 10/29/2023 the patient reports that their mood is stable. The patient denied having suicidal thoughts for more than 48 hours prior to discharge.  Patient denies having homicidal thoughts.  Patient denies having auditory hallucinations.  Patient denies any visual hallucinations or other symptoms of psychosis. The patient was motivated to continue taking medication with a goal of continued improvement in mental health.    The patient reports their target psychiatric symptoms of depression and anxiety responded well to the psychiatric medications, and the patient reports overall benefit other psychiatric hospitalization. Supportive psychotherapy was provided to the patient. The patient also participated in regular group therapy while hospitalized. Coping skills, problem solving as well as relaxation therapies were also part of the unit programming.   Labs were reviewed with the patient, and abnormal results were discussed with the patient.   The patient is able to verbalize their individual safety plan to this provider.   Behavioral Events: None   Restraints: None   Groups: Attended and participated   Medications Changes: As above   Sleep  Fair, improved during hospital stay      Physical Findings: AIMS:  , ,  ,  ,    CIWA:  CIWA-Ar Total: 0 COWS:     Musculoskeletal: Strength & Muscle Tone: within normal limits Gait & Station: normal Patient leans: N/A   Psychiatric Specialty Exam:  General Appearance: appears at stated age, fairly dressed and groomed  Behavior: pleasant and cooperative  Psychomotor Activity:No psychomotor  agitation or retardation noted   Eye Contact: good Speech: normal amount, tone, volume and latency   Mood: euthymic Affect: congruent, pleasant and interactive  Thought Process: linear, goal directed, no circumstantial or tangential thought process noted, no racing thoughts or flight of ideas Descriptions of Associations: intact Thought Content: Hallucinations: denies AH, VH , does not appear responding to stimuli Delusions: No paranoia or other delusions noted Suicidal Thoughts: denies SI, intention, plan  Homicidal Thoughts: denies HI, intention, plan   Alertness/Orientation: alert and fully oriented  Insight: fair, improved Judgment: fair, improved  Memory: intact  Executive Functions  Concentration: intact  Attention Span: Fair Recall: intact Fund of Knowledge: fair   Assets  Assets: Manufacturing systems engineer; Desire for Improvement; Financial Resources/Insurance; Housing; Resilience; Social Support   Physical Exam:  Physical Exam Vitals and nursing note reviewed.  Constitutional:      Appearance: Normal appearance. He is normal  weight.  HENT:     Head: Normocephalic and atraumatic.  Eyes:     Extraocular Movements: Extraocular movements intact.  Pulmonary:     Effort: Pulmonary effort is normal.  Musculoskeletal:        General: Normal range of motion.     Cervical back: Normal range of motion.  Neurological:     General: No focal deficit present.     Mental Status: He is alert and oriented to person, place, and time. Mental status is at baseline.  Psychiatric:        Mood and Affect: Mood normal.        Behavior: Behavior normal.        Thought Content: Thought content normal.        Judgment: Judgment normal.    Review of Systems  All other systems reviewed and are negative.  Blood pressure 116/79, pulse 78, temperature 97.7 F (36.5 C), temperature source Oral, resp. rate 18, height 5\' 10"  (1.778 m), weight 64.9 kg, SpO2 99%. Body mass index is 20.52  kg/m.   Social History   Tobacco Use  Smoking Status Every Day   Current packs/day: 2.00   Average packs/day: 2.0 packs/day for 20.0 years (40.0 ttl pk-yrs)   Types: Cigarettes  Smokeless Tobacco Never   Tobacco Cessation:  A prescription for an FDA-approved tobacco cessation medication provided at discharge   Blood Alcohol level:  Lab Results  Component Value Date   ETH 12 (H) 10/23/2023   ETH 275 (H) 10/22/2023    Metabolic Disorder Labs:  No results found for: "HGBA1C", "MPG" No results found for: "PROLACTIN" Lab Results  Component Value Date   CHOL 225 (H) 10/14/2013   TRIG 112 10/14/2013   HDL 62 10/14/2013   CHOLHDL 3.6 10/14/2013   VLDL 22 10/14/2013   LDLCALC 141 (H) 10/14/2013   LDLCALC (H) 12/24/2010    156        Total Cholesterol/HDL:CHD Risk Coronary Heart Disease Risk Table                     Men   Women  1/2 Average Risk   3.4   3.3  Average Risk       5.0   4.4  2 X Average Risk   9.6   7.1  3 X Average Risk  23.4   11.0        Use the calculated Patient Ratio above and the CHD Risk Table to determine the patient's CHD Risk.        ATP III CLASSIFICATION (LDL):  <100     mg/dL   Optimal  132-440  mg/dL   Near or Above                    Optimal  130-159  mg/dL   Borderline  102-725  mg/dL   High  >366     mg/dL   Very High    See Psychiatric Specialty Exam and Suicide Risk Assessment completed by Attending Physician prior to discharge.  Discharge destination:  Home  Is patient on multiple antipsychotic therapies at discharge:  No   Has Patient had three or more failed trials of antipsychotic monotherapy by history:  No  Recommended Plan for Multiple Antipsychotic Therapies: NA  Discharge Instructions     Diet - low sodium heart healthy   Complete by: As directed    Increase activity slowly   Complete by: As directed  Allergies as of 10/29/2023       Reactions   Effexor [venlafaxine] Hives   Paroxetine Hives   Paxil  [paroxetine Hcl] Hives, Rash        Medication List     STOP taking these medications    acetaminophen 500 MG tablet Commonly known as: TYLENOL   CertaVite/Antioxidants Tabs   folic acid 1 MG tablet Commonly known as: FOLVITE   thiamine 100 MG tablet Commonly known as: VITAMIN B1       TAKE these medications      Indication  gabapentin 300 MG capsule Commonly known as: NEURONTIN Take 1 capsule (300 mg total) by mouth 3 (three) times daily.  Indication: mood and anxiety   hydrOXYzine 25 MG tablet Commonly known as: ATARAX Take 1 tablet (25 mg total) by mouth 3 (three) times daily as needed for itching or anxiety.  Indication: Feeling Anxious   nicotine 21 mg/24hr patch Commonly known as: NICODERM CQ - dosed in mg/24 hours Place 1 patch (21 mg total) onto the skin daily.  Indication: Nicotine Addiction   traZODone 50 MG tablet Commonly known as: DESYREL Take 1 tablet (50 mg total) by mouth at bedtime as needed for sleep (for difficulty initiating sleep in patients with depressive symptoms).  Indication: Trouble Sleeping        Follow-up Information     [facility] Follow up on 11/02/2023.   Why: You have a behavioral health nurse assessment appointment on 11/02/23 at 1:00 pm, phone call.  You also have a hospital follow up appointment for therapy and medication management services on  11/11/23 at 1:30 pm, in person. Benay Pillow information: [address] [place] [address] [phone]                 Discharge recommendations:   Activity: as tolerated  Diet: heart healthy  # It is recommended to the patient to continue psychiatric medications as prescribed, after discharge from the hospital.     # It is recommended to the patient to follow up with your outpatient psychiatric provider and PCP.   # It was discussed with the patient, the impact of alcohol, drugs, tobacco have been there overall psychiatric and  medical wellbeing, and total abstinence from substance use was recommended the patient.ed.   # Prescriptions provided or sent directly to preferred pharmacy at discharge. Patient agreeable to plan. Given opportunity to ask questions. Appears to feel comfortable with discharge.    # In the event of worsening symptoms, the patient is instructed to call the crisis hotline, 911 and or go to the nearest ED for appropriate evaluation and treatment of symptoms. To follow-up with primary care provider for other medical issues, concerns and or health care needs   # Patient was discharged home with a plan to follow up as noted above.  -Follow-up with outpatient primary care doctor and other specialists -for management of chronic medical disease, including: Patient was recommended to establish care with primary care provider at the [facility] for long-term care, he agrees.    Patient agrees with D/C instructions and plan.   The patient received suicide prevention pamphlet:  Yes Belongings returned:  Clothing and Valuables  Total Time Spent in Direct Patient Care:  I personally spent 45 minutes on the unit in direct patient care. The direct patient care time included face-to-face time with the patient, reviewing the patient's chart, communicating with other professionals, and coordinating care. Greater than 50% of this time was  spent in counseling or coordinating care with the patient regarding goals of hospitalization, psycho-education, and discharge planning needs.    SignedSarita Bottom, MD 10/29/2023, 9:39 AM

## 2023-10-29 NOTE — Progress Notes (Signed)
  [facility] Adult Case Management Discharge Plan :  Will you be returning to the same living situation after discharge:  Yes,  pt will be returning home at discharge At discharge, do you have transportation home?: Yes,  pt will be picked up by girlfriend at 12 PM Do you have the ability to pay for your medications: Yes,  pt [facility]  Release of information consent forms completed and in the chart;  Patient's signature needed at discharge.  Patient to Follow up at:  Follow-up Information     [facility]2023.   Why: You have a behavioral health nurse assessment appointment on 11/02/23 at 1:00 pm, phone call.  You also have a hospital follow up appointment for therapy and medication management services on  11/11/23 at 1:30 pm, in person. Benay Pillow information: [address] [facility] [place] [address] [phone]                 Next level of care provider has access to [facility] Link:no  Safety Planning and Suicide Prevention discussed: Yes,  Hazle Nordmann (son) [phone] only     Has patient been referred to the Quitline?: Patient refused referral for treatment  Patient has been referred for addiction treatment: No known substance use disorder.  Kathi Der, LCSWA 10/29/2023, 9:27 AM

## 2023-10-29 NOTE — Group Note (Signed)
Date:  10/29/2023 Time:  11:15 AM  Group Topic/Focus:  Goals Group:   The focus of this group is to help patients establish daily goals to achieve during treatment and discuss how the patient can incorporate goal setting into their daily lives to aide in recovery. Orientation:   The focus of this group is to educate the patient on the purpose and policies of crisis stabilization and provide a format to answer questions about their admission.  The group details unit policies and expectations of patients while admitted.    Participation Level:  Active  Participation Quality:  Attentive  Affect:  Appropriate  Cognitive:  Appropriate  Insight: Appropriate  Engagement in Group:  Engaged  Modes of Intervention:  Discussion  Additional Comments:  Patient attend goals group and was attentive the duration of it. Patient's goal was to stay calm and workout.   Ronen Bromwell T Mccade Sullenberger 10/29/2023, 11:15 AM

## 2023-10-29 NOTE — [facility] Group Notes (Signed)
Spiritual care group on grief and loss facilitated by Chaplain Dyanne Carrel, [facility]  Group Goal: Support / Education around grief and loss  Members engage in facilitated group support and psycho-social education.  Group Description:  Following introductions and group rules, group members engaged in facilitated group dialogue and support around topic of loss, with particular support around experiences of loss in their lives. Group Identified types of loss (relationships / self / things) and identified patterns, circumstances, and changes that precipitate losses. Reflected on thoughts / feelings around loss, normalized grief responses, and recognized variety in grief experience. Group encouraged individual reflection on safe space and on the coping skills that they are already utilizing.  Group drew on Adlerian / Rogerian and narrative framework  Patient Progress: [name] attended group and actively engaged and participated in group conversation and activities.

## 2023-12-21 ENCOUNTER — Encounter (HOSPITAL_COMMUNITY): Payer: Self-pay

## 2023-12-21 ENCOUNTER — Emergency Department (HOSPITAL_COMMUNITY)
Admission: EM | Admit: 2023-12-21 | Discharge: 2023-12-22 | Disposition: A | Discharge status: Other Acute Inpatient Hospital | Attending: Emergency Medicine | Admitting: Emergency Medicine

## 2023-12-21 ENCOUNTER — Other Ambulatory Visit: Payer: Self-pay

## 2023-12-21 DIAGNOSIS — F4310 Post-traumatic stress disorder, unspecified: Secondary | ICD-10-CM | POA: Insufficient documentation

## 2023-12-21 DIAGNOSIS — F1014 Alcohol abuse with alcohol-induced mood disorder: Secondary | ICD-10-CM | POA: Diagnosis not present

## 2023-12-21 DIAGNOSIS — R45851 Suicidal ideations: Secondary | ICD-10-CM | POA: Insufficient documentation

## 2023-12-21 DIAGNOSIS — Z720 Tobacco use: Secondary | ICD-10-CM | POA: Diagnosis not present

## 2023-12-21 DIAGNOSIS — Y908 Blood alcohol level of 240 mg/100 ml or more: Secondary | ICD-10-CM | POA: Insufficient documentation

## 2023-12-21 DIAGNOSIS — F29 Unspecified psychosis not due to a substance or known physiological condition: Secondary | ICD-10-CM | POA: Diagnosis present

## 2023-12-21 LAB — CBC WITH DIFFERENTIAL/PLATELET
Abs Immature Granulocytes: 0.03 10*3/uL (ref 0.00–0.07)
Basophils Absolute: 0.1 10*3/uL (ref 0.0–0.1)
Basophils Relative: 1 %
Eosinophils Absolute: 0.1 10*3/uL (ref 0.0–0.5)
Eosinophils Relative: 1 %
HCT: 49.9 % (ref 39.0–52.0)
Hemoglobin: 16.8 g/dL (ref 13.0–17.0)
Immature Granulocytes: 0 %
Lymphocytes Relative: 51 %
Lymphs Abs: 5.3 10*3/uL — ABNORMAL HIGH (ref 0.7–4.0)
MCH: 26.8 pg (ref 26.0–34.0)
MCHC: 33.7 g/dL (ref 30.0–36.0)
MCV: 79.6 fL — ABNORMAL LOW (ref 80.0–100.0)
Monocytes Absolute: 0.6 10*3/uL (ref 0.1–1.0)
Monocytes Relative: 6 %
Neutro Abs: 4.3 10*3/uL (ref 1.7–7.7)
Neutrophils Relative %: 41 %
Platelets: 276 10*3/uL (ref 150–400)
RBC: 6.27 MIL/uL — ABNORMAL HIGH (ref 4.22–5.81)
RDW: 14.9 % (ref 11.5–15.5)
WBC: 10.4 10*3/uL (ref 4.0–10.5)
nRBC: 0 % (ref 0.0–0.2)

## 2023-12-21 LAB — COMPREHENSIVE METABOLIC PANEL
ALT: 26 U/L (ref 0–44)
AST: 34 U/L (ref 15–41)
Albumin: 4.5 g/dL (ref 3.5–5.0)
Alkaline Phosphatase: 62 U/L (ref 38–126)
Anion gap: 18 — ABNORMAL HIGH (ref 5–15)
BUN: 8 mg/dL (ref 6–20)
CO2: 17 mmol/L — ABNORMAL LOW (ref 22–32)
Calcium: 8.7 mg/dL — ABNORMAL LOW (ref 8.9–10.3)
Chloride: 103 mmol/L (ref 98–111)
Creatinine, Ser: 0.76 mg/dL (ref 0.61–1.24)
GFR, Estimated: >60 mL/min (ref >60)
Glucose, Bld: 74 mg/dL (ref 70–99)
Potassium: 3.5 mmol/L (ref 3.5–5.1)
Sodium: 138 mmol/L (ref 135–145)
Total Bilirubin: 0.7 mg/dL (ref 0.0–1.2)
Total Protein: 8.1 g/dL (ref 6.5–8.1)

## 2023-12-21 LAB — RAPID URINE DRUG SCREEN, HOSP PERFORMED
Amphetamines: NOT DETECTED
Barbiturates: NOT DETECTED
Benzodiazepines: NOT DETECTED
Cocaine: NOT DETECTED
Opiates: NOT DETECTED
Tetrahydrocannabinol: POSITIVE — AB

## 2023-12-21 LAB — URINALYSIS, ROUTINE W REFLEX MICROSCOPIC
Bacteria, UA: NONE SEEN
Bilirubin Urine: NEGATIVE
Glucose, UA: NEGATIVE mg/dL
Ketones, ur: NEGATIVE mg/dL
Leukocytes,Ua: NEGATIVE
Nitrite: NEGATIVE
Protein, ur: NEGATIVE mg/dL
Specific Gravity, Urine: 1.003 — ABNORMAL LOW (ref 1.005–1.030)
pH: 5 (ref 5.0–8.0)

## 2023-12-21 LAB — ETHANOL: Alcohol, Ethyl (B): 377 mg/dL (ref <10)

## 2023-12-21 MED ORDER — THIAMINE MONONITRATE 100 MG PO TABS
100.0000 mg | ORAL_TABLET | Freq: Every day | ORAL | Status: DC
  Administered 2023-12-22 (×2): 100 mg via ORAL
  Filled 2023-12-21: qty 1

## 2023-12-21 MED ORDER — THIAMINE HCL 100 MG/ML IJ SOLN: 100.0000 mg | Freq: Every day | INTRAMUSCULAR | Status: DC

## 2023-12-21 MED ORDER — LORAZEPAM 2 MG/ML IJ SOLN
2.0000 mg | Freq: Once | INTRAMUSCULAR | Status: AC
  Administered 2023-12-21: 2 mg via INTRAMUSCULAR
  Filled 2023-12-21: qty 1

## 2023-12-21 MED ORDER — LORAZEPAM 1 MG PO TABS
0.0000 mg | ORAL_TABLET | Freq: Four times a day (QID) | ORAL | Status: DC
  Administered 2023-12-22 (×3): 2 mg via ORAL
  Filled 2023-12-21 (×3): qty 2

## 2023-12-21 MED ORDER — DIPHENHYDRAMINE HCL 50 MG/ML IJ SOLN
50.0000 mg | Freq: Once | INTRAMUSCULAR | Status: AC
  Administered 2023-12-21: 50 mg via INTRAMUSCULAR
  Filled 2023-12-21: qty 1

## 2023-12-21 MED ORDER — LORAZEPAM 2 MG/ML IJ SOLN: 0.0000 mg | Freq: Four times a day (QID) | INTRAMUSCULAR | Status: DC

## 2023-12-21 MED ORDER — LORAZEPAM 1 MG PO TABS: 0.0000 mg | ORAL_TABLET | Freq: Two times a day (BID) | ORAL | Status: DC

## 2023-12-21 MED ORDER — LORAZEPAM 2 MG/ML IJ SOLN: 0.0000 mg | Freq: Two times a day (BID) | INTRAMUSCULAR | Status: DC

## 2023-12-21 MED ORDER — HALOPERIDOL LACTATE 5 MG/ML IJ SOLN
5.0000 mg | Freq: Once | INTRAMUSCULAR | Status: AC
  Administered 2023-12-21: 5 mg via INTRAMUSCULAR
  Filled 2023-12-21: qty 1

## 2023-12-21 NOTE — ED Triage Notes (Signed)
 Pt arrives with PD due to suicidal and homicidal thoughts that started awhile ago. Pt hallucinating and yelling about the [employer]. Hx of PTSD. Pt states he wants a rifle. Very tearful. Pt smells of ETOH.

## 2023-12-21 NOTE — ED Notes (Addendum)
 Patient cursing and yelling and stating "Fuck you, I served for this country, you're all alive because of me". EDP at bedside, ordered meds for patient.

## 2023-12-21 NOTE — ED Notes (Signed)
 Pt transported to Purple zone room 50

## 2023-12-21 NOTE — BH Assessment (Signed)
 TTS attempted to complete assessment, patient refused to engage in assessment. Pt stated that he does not want to do the assessment through the telehealth screen and will not speak to anyone through a screen. RN present and notified of this information. TTS will continue to monitor for updates.

## 2023-12-21 NOTE — ED Provider Notes (Signed)
 [facility] AT [facility] Provider Note   CSN: [id number] Arrival date & time: 12/21/23  1557     History Chief Complaint  Patient presents with   Suicidal   Homicidal    HPI [name] is a 53 y.o. male presenting for suicidal thoughts. Police were called out as patient was walking on the street stating that he wanted to kill himself and asking to be given a gun.  Stating that when he was in the [employer] he saw people burning and that "I want to burn".  Reported substance use History of alcohol use disorder, tobacco use disorder, severe withdrawals in the past PTSD as well as other psychiatric diagnoses.  Patient's recorded medical, surgical, social, medication list and allergies were reviewed in the Snapshot window as part of the initial history.   Review of Systems   Review of Systems  Constitutional:  Negative for chills and fever.  HENT:  Negative for ear pain and sore throat.   Eyes:  Negative for pain and visual disturbance.  Respiratory:  Negative for cough and shortness of breath.   Cardiovascular:  Negative for chest pain and palpitations.  Gastrointestinal:  Negative for abdominal pain and vomiting.  Genitourinary:  Negative for dysuria and hematuria.  Musculoskeletal:  Negative for arthralgias and back pain.  Skin:  Negative for color change and rash.  Neurological:  Negative for seizures and syncope.  Psychiatric/Behavioral:  Positive for agitation, behavioral problems and sleep disturbance. The patient is nervous/anxious.   All other systems reviewed and are negative.   Physical Exam Updated Vital Signs BP 105/70   Pulse 82   Temp 98.6 F (37 C)   Resp 19   SpO2 95%  Physical Exam Vitals and nursing note reviewed.  Constitutional:      General: He is not in acute distress.    Appearance: He is well-developed.  HENT:     Head: Normocephalic and atraumatic.  Eyes:     Conjunctiva/sclera: Conjunctivae normal.   Cardiovascular:     Rate and Rhythm: Normal rate and regular rhythm.     Heart sounds: No murmur heard. Pulmonary:     Effort: Pulmonary effort is normal. No respiratory distress.     Breath sounds: Normal breath sounds.  Abdominal:     Palpations: Abdomen is soft.     Tenderness: There is no abdominal tenderness.  Musculoskeletal:        General: No swelling.     Cervical back: Neck supple.  Skin:    General: Skin is warm and dry.     Capillary Refill: Capillary refill takes less than 2 seconds.  Neurological:     Mental Status: He is alert.  Psychiatric:        Mood and Affect: Mood normal.      ED Course/ Medical Decision Making/ A&P    Procedures Procedures   Medications Ordered in ED Medications  LORazepam (ATIVAN) injection 0-4 mg (has no administration in time range)    Or  LORazepam (ATIVAN) tablet 0-4 mg (has no administration in time range)  LORazepam (ATIVAN) injection 0-4 mg (has no administration in time range)    Or  LORazepam (ATIVAN) tablet 0-4 mg (has no administration in time range)  thiamine (VITAMIN B1) tablet 100 mg (has no administration in time range)    Or  thiamine (VITAMIN B1) injection 100 mg (has no administration in time range)  LORazepam (ATIVAN) injection 2 mg (2 mg Intramuscular Given 12/21/23  1617)  haloperidol lactate (HALDOL) injection 5 mg (5 mg Intramuscular Given 12/21/23 1618)  diphenhydrAMINE (BENADRYL) injection 50 mg (50 mg Intramuscular Given 12/21/23 1618)   Medical Decision Making:   [name] is a 53 y.o. male who presented to the ED today for psychiatric evaluation.  Patient is endorsing SI with plan to shoot himself.  Patient does have a history of PTSD/Psychiatric dz.  They are not compliant with their medications.  On my initial exam, the pt was tangential in thought, appropriate in affect, and overall well-appearing.  Vital signs reviewed and reassuring.  Reviewed and confirmed nursing documentation for past  medical history, family history, social history.   Patient placed on continuous vitals and telemetry monitoring while in ED which was reviewed periodically.     Initial Assessment:   This is most consistent with an acute life threatening illness. With the patient's presentation of SI with plan, patient warrants emergent psychiatric consultation.  Differential includes primary psychosis, substance-induced psychosis, mood disturbance.  Initial Plan:  Patient immediately placed into ED psychiatric hold protocol including suicide precautions, elopement precautions and vital sign monitoring.    Emergent behavioral health hold signed and notarized while awaiting psychiatric consultation due to threat to self or others.  Psychiatry consulted for further evaluation once patient medically cleared.  Medical screening evaluation ordered and reviewed with no obvious medical reason to postpone psychiatric evaluation.  Patient IVC'd.   Final Assessment and Plan:   Pending psychiatric eval.   Clinical Impression:  1. Psychosis, unspecified psychosis type (HCC)      Data Unavailable   Final Clinical Impression(s) / ED Diagnoses Final diagnoses:  Psychosis, unspecified psychosis type [facility])    Rx / DC Orders ED Discharge Orders     None         Glyn Ade, MD 12/21/23 2044

## 2023-12-21 NOTE — ED Notes (Signed)
 IVC paperwork complete and in blue zone, expires 12/28/23, case # [id number]

## 2023-12-21 NOTE — ED Notes (Signed)
 Pt would not do the TTS consult. Pt said he didn't want to talk to a Ipad

## 2023-12-21 NOTE — ED Notes (Signed)
 Soft restraints off patient, patient is sleeping at this time

## 2023-12-21 NOTE — ED Notes (Signed)
 This RN has asked [employer] to stay with patient, however while this RN was in another room with a patient, [employer] had left without informing staff.

## 2023-12-21 NOTE — ED Notes (Signed)
 PT got up and walked to the restroom

## 2023-12-22 ENCOUNTER — Other Ambulatory Visit (HOSPITAL_COMMUNITY)
Admission: EM | Admit: 2023-12-22 | Discharge: 2023-12-25 | Disposition: A | Discharge status: Home / Self Care | Attending: Nurse Practitioner | Admitting: Nurse Practitioner

## 2023-12-22 DIAGNOSIS — F1090 Alcohol use, unspecified, uncomplicated: Secondary | ICD-10-CM

## 2023-12-22 DIAGNOSIS — F1024 Alcohol dependence with alcohol-induced mood disorder: Secondary | ICD-10-CM | POA: Diagnosis present

## 2023-12-22 DIAGNOSIS — F1014 Alcohol abuse with alcohol-induced mood disorder: Secondary | ICD-10-CM

## 2023-12-22 MED ORDER — ALUM & MAG HYDROXIDE-SIMETH 200-200-20 MG/5ML PO SUSP: 30.0000 mL | ORAL | Status: DC | PRN

## 2023-12-22 MED ORDER — LORAZEPAM 2 MG/ML IJ SOLN: 2.0000 mg | Freq: Three times a day (TID) | INTRAMUSCULAR | Status: DC | PRN

## 2023-12-22 MED ORDER — ACETAMINOPHEN 325 MG PO TABS: 650.0000 mg | ORAL_TABLET | Freq: Four times a day (QID) | ORAL | Status: DC | PRN

## 2023-12-22 MED ORDER — HALOPERIDOL LACTATE 5 MG/ML IJ SOLN
5.0000 mg | Freq: Three times a day (TID) | INTRAMUSCULAR | Status: DC | PRN
Start: 2023-12-22 — End: 2023-12-25

## 2023-12-22 MED ORDER — LOPERAMIDE HCL 2 MG PO CAPS: 2.0000 mg | ORAL_CAPSULE | ORAL | Status: DC | PRN

## 2023-12-22 MED ORDER — DIPHENHYDRAMINE HCL 50 MG/ML IJ SOLN: 50.0000 mg | Freq: Three times a day (TID) | INTRAMUSCULAR | Status: DC | PRN

## 2023-12-22 MED ORDER — LORAZEPAM 1 MG PO TABS: 1.0000 mg | ORAL_TABLET | Freq: Four times a day (QID) | ORAL | Status: DC | PRN

## 2023-12-22 MED ORDER — HALOPERIDOL LACTATE 5 MG/ML IJ SOLN: 10.0000 mg | Freq: Three times a day (TID) | INTRAMUSCULAR | Status: DC | PRN

## 2023-12-22 MED ORDER — HALOPERIDOL 5 MG PO TABS
5.0000 mg | ORAL_TABLET | Freq: Three times a day (TID) | ORAL | Status: DC | PRN
  Administered 2023-12-23: 5 mg via ORAL
  Filled 2023-12-22: qty 1

## 2023-12-22 MED ORDER — MAGNESIUM HYDROXIDE 400 MG/5ML PO SUSP: 30.0000 mL | Freq: Every day | ORAL | Status: DC | PRN

## 2023-12-22 MED ORDER — THIAMINE MONONITRATE 100 MG PO TABS
100.0000 mg | ORAL_TABLET | Freq: Every day | ORAL | Status: DC
  Administered 2023-12-23 – 2023-12-24 (×2): 100 mg via ORAL
  Filled 2023-12-22 (×2): qty 1

## 2023-12-22 MED ORDER — ONDANSETRON 4 MG PO TBDP: 4.0000 mg | ORAL_TABLET | Freq: Four times a day (QID) | ORAL | Status: DC | PRN

## 2023-12-22 MED ORDER — ADULT MULTIVITAMIN W/MINERALS CH
1.0000 | ORAL_TABLET | Freq: Every day | ORAL | Status: DC
  Administered 2023-12-23 – 2023-12-24 (×2): 1 via ORAL
  Filled 2023-12-22 (×2): qty 1

## 2023-12-22 MED ORDER — DIPHENHYDRAMINE HCL 50 MG PO CAPS
50.0000 mg | ORAL_CAPSULE | Freq: Three times a day (TID) | ORAL | Status: DC | PRN
  Administered 2023-12-23: 50 mg via ORAL
  Filled 2023-12-22: qty 1

## 2023-12-22 MED ORDER — HYDROXYZINE HCL 25 MG PO TABS
25.0000 mg | ORAL_TABLET | Freq: Four times a day (QID) | ORAL | Status: DC | PRN
  Administered 2023-12-23 (×2): 25 mg via ORAL
  Filled 2023-12-22 (×2): qty 1

## 2023-12-22 NOTE — ED Provider Notes (Signed)
 Emergency Medicine Observation Re-evaluation Note  [name] is a 53 y.o. male, seen on rounds today.  Pt initially presented to the ED for complaints of Suicidal and Homicidal Currently, the patient is resting and sleeping in bed without agitation.  Physical Exam  BP 118/85   Pulse (!) 130   Temp 98.8 F (37.1 C) (Oral)   Resp 20   SpO2 96%  Physical Exam General: Resting asleep without agitation Lungs: Symmetric rise and fall of chest without acute distress Psych: No agitation at this time  ED Course / MDM  EKG:EKG Interpretation Date/Time:  Monday December 21 2023 17:00:32 EDT Ventricular Rate:  74 PR Interval:  166 QRS Duration:  78 QT Interval:  388 QTC Calculation: 430 R Axis:   121  Text Interpretation: Normal sinus rhythm Anterolateral infarct , age undetermined Abnormal ECG When compared with ECG of 21-Dec-2023 16:59, No significant change was found Confirmed by Dione Booze ([id number]) on 12/22/2023 2:44:24 AM  I have reviewed the labs performed to date as well as medications administered while in observation.  Recent changes in the last 24 hours include none reported by overnight nursing.  Plan  Current plan is for awaiting psychiatric recommendations.    Enzio Buchler, Canary Brim, MD 12/22/23 [phone]

## 2023-12-22 NOTE — ED Notes (Signed)
 IVC paperwork is complete and located in the purple zone.   Case Number [id number]

## 2023-12-22 NOTE — ED Notes (Addendum)
 ED called and update [facility] about [name]. Made staff aware that there is no estimated time for [name]'s arrival to [facility], due to a lockdown at [facility] ED.

## 2023-12-22 NOTE — ED Notes (Signed)
 Pt was offered a dinner. Pt is calm and composed.

## 2023-12-22 NOTE — Consult Note (Signed)
 [facility] Psychiatric Consult Initial  Patient Name: .[name]  MRN: [id number]  DOB: 03/31/1971  Consult Order details:  Orders (From admission, onward)     Start     Ordered   12/21/23 2044  CONSULT TO CALL ACT TEAM       Ordering Provider: Glyn Ade, MD  Provider:  (Not yet assigned)  Question:  Reason for Consult?  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  PTSD 3.  Suicidal ideations  Assessment  [name] is a 53 y.o. male admitted: Presented to the EDfor 12/21/2023  3:57 PM under IVC due to walking around, begging people on the street for a gun so he could shoot himself. He carries the psychiatric diagnoses of PTSD, alcohol abuse, and depression.  His current presentation of irritability is most consistent with alcohol abuse. He meets criteria for IP based on severity of suicidal ideations.  Current outpatient psychotropic medications include none. On initial examination, patient denies SI, states "he was saying stupid stuff", and is requesting to discharge. Please see plan below for detailed recommendations.   Diagnoses:  Active Hospital problems: Principal Problem:   Alcohol abuse with alcohol-induced mood disorder (HCC) Active Problems:   Suicidal ideation    Plan   ## Psychiatric Medication Recommendations:  - Offered patient to trial Zoloft, as it would be effective in treatment for PTSD, Depression, anxiety. However, he declined stating he "doesn't want to be anyones [place] pig."   ## Medical Decision Making Capacity: Not specifically addressed in this encounter  ## Further Work-up:  BAL was 377 upon arrival UDS positive for [facility]   ## Disposition:-- We recommend inpatient  psychiatric hospitalization after medical hospitalization. Patient has been involuntarily committed on 12/21/23.   ## Behavioral / Environmental: - No specific recommendations at this time.     ## Safety and Observation Level:  - Based on my clinical evaluation, I estimate the patient to be at high risk of self harm in the current setting. - At this time, we recommend  routine. This decision is based on my review of the chart including patient's history and current presentation, interview of the patient, mental status examination, and consideration of suicide risk including evaluating suicidal ideation, plan, intent, suicidal or self-harm behaviors, risk factors, and protective factors. This judgment is based on our ability to directly address suicide risk, implement suicide prevention strategies, and develop a safety plan while the patient is in the clinical setting. Please contact our team if there is a concern that risk level has changed.  CSSR Risk Category:C-SSRS RISK CATEGORY: High Risk  Suicide Risk Assessment: Patient has following modifiable risk factors for suicide: untreated depression, under treated depression , and medication noncompliance, which we are addressing by inpatient treatment. Patient has following non-modifiable or demographic risk factors for suicide: male gender and psychiatric hospitalization Patient has the following protective factors against suicide: Supportive family and Cultural, spiritual, or religious beliefs that discourage suicide  Thank you for this consult request. Recommendations have been communicated to the primary team.  We will recommend inpatient psych tx at this time.   Dennis Bridegroom, NP  History of Present Illness  Relevant Aspects of Hospital ED Course:  [name] is a 53 y.o. male presenting for suicidal thoughts. Police were called out as patient was walking on the street stating that he wanted to kill himself and asking to be  given a gun.  Stating that when he was in the [employer] he saw people burning and that "I want to burn".  Reported substance use History of alcohol use disorder, tobacco use disorder, severe withdrawals in the past PTSD as well as other psychiatric diagnoses.  Patient Report:  Pt seen at [facility] for face to face psychiatric evaluation. Pt is irritable, but cooperative. Pt is discharge focused, states "whatever I did last night, I'm fine. I don't need help, I don't need to stay here. I don't want to kill myself. I am fine just let me go." Pt reports no recollection of events that happened yesterday. Explained to him he was asking random people for guns so he could shoot himself. Pt stated "oh well that's not good. Well I don't mean it, it was just a ptsd flare up I'm fine." Pt has little insight and poor judgement. He does deny current SI. Denies having any access to weapons or firearms. States he is close with his son, and sees him multiple times a week. He denies HI. Denies AVH.   Pt states he was sober for around 50 days and relapsed yesterday. Stated he was doing house work, became frustrated, and started to drink. Pt is shameful of his relapse and reports regret. Pt endorses THC use, states he has a medical marijuana license and it helps with his nightmares. Pt continued to ask to discharge home, in which he was informed he is under IVC and will need IP treatment. Attempted to educate patient on medications, offering zoloft and prazosin to help with PTSD, depression, nightmares, etc. However, patient became irritable, telling this provider to leave immediately, and refusing to start any medications stating "I wont be your [place] pig."   Will recommend pt remain under IVC and transfer to inpatient psychiatric treatment.    Collateral information:  denies  Review of Systems  Psychiatric/Behavioral:  Positive for depression, substance abuse and suicidal ideas. The patient is nervous/anxious.   All other  systems reviewed and are negative.    Psychiatric and Social History  Psychiatric History:  Information collected from patient, chart  Prev Dx/Sx: PTSD, MDD, alcohol abuse Current [facility] Provider[facility]current[facility] none Previous Med Trials: none Therapy: reports monthly therapy through [place]  Prior Psych Hospitalization: yes, [facility] in January for similar episode  Prior Self Harm: yes Prior Violence: denies    Social History:  Legal Hx: denies current legal charges Living Situation: lives alone Spiritual Hx: yes Access to weapons/lethal means: denies   Substance History Alcohol: yes, recent relapse after 50 days of sobriety  Tobacco: yes Illicit drugs: THC Prescription drug abuse: denies Rehab hx: yes  Exam Findings  Physical Exam:  Vital Signs:  Temp:  [98.6 F (37 C)-98.8 F (37.1 C)] 98.8 F (37.1 C) (03/18 0533) Pulse Rate:  [73-130] 130 (03/18 0533) Resp:  [19-20] 20 (03/18 0533) BP: (105-118)/(70-85) 118/85 (03/18 0533) SpO2:  [95 %-96 %] 96 % (03/18 0533) Blood pressure 118/85, pulse (!) 130, temperature 98.8 F (37.1 C), temperature source Oral, resp. rate 20, SpO2 96%. There is no height or weight on file to calculate BMI.  Physical Exam Vitals and nursing note reviewed.  Neurological:  Mental Status: He is alert and oriented to person, place, and time.     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  Family History:  The patient's family history includes Alcohol abuse in his father.  Medical History: Past Medical History:  Diagnosis Date   Active smoker    Alcohol abuse    Anxiety    Bipolar 2 disorder (HCC)    Depression    PTSD (post-traumatic stress disorder)     Surgical History: Past Surgical History:  Procedure Laterality Date   HERNIA REPAIR     LYMPH NODE DISSECTION       Medications:   Current Facility-Administered Medications:    LORazepam (ATIVAN) injection 0-4 mg, 0-4 mg, Intravenous, Q6H **OR** LORazepam (ATIVAN) tablet 0-4 mg, 0-4 mg, Oral, Q6H, Countryman, Chase, MD, 2 mg at 12/22/23 0954   [START ON 12/24/2023] LORazepam (ATIVAN) injection 0-4 mg, 0-4 mg, Intravenous, Q12H **OR** [START ON 12/24/2023] LORazepam (ATIVAN) tablet 0-4 mg, 0-4 mg, Oral, Q12H, Countryman, Chase, MD   thiamine (VITAMIN B1) tablet 100 mg, 100 mg, Oral, Daily, 100 mg at 12/22/23 0954 **OR** thiamine (VITAMIN B1) injection 100 mg, 100 mg, Intravenous, Daily, Countryman, Chase, MD  Current Outpatient Medications:    gabapentin (NEURONTIN) 300 MG capsule, Take 1 capsule (300 mg total) by mouth 3 (three) times daily. (Patient not taking: Reported on 12/22/2023), Disp: 90 capsule, Rfl: 0   hydrOXYzine (ATARAX) 25 MG tablet, Take 1 tablet (25 mg total) by mouth 3 (three) times daily as needed for itching or anxiety. (Patient not taking: Reported on 12/22/2023), Disp: 60 tablet, Rfl: 0   nicotine (NICODERM CQ - DOSED IN MG/24 HOURS) 21 mg/24hr patch, Place 1 patch (21 mg total) onto the skin daily. (Patient not taking: Reported on 12/22/2023), Disp: 28 patch, Rfl: 0   traZODone (DESYREL) 50 MG tablet, Take 1 tablet (50 mg total) by mouth at bedtime as needed for  sleep (for difficulty initiating sleep in patients with depressive symptoms). (Patient not taking: Reported on 12/22/2023), Disp: 30 tablet, Rfl: 0  Allergies: Allergies  Allergen Reactions   Effexor [Venlafaxine] Hives   Paroxetine Hives   Paxil [Paroxetine Hcl] Hives and Rash    Dennis Bridegroom, NP

## 2023-12-23 DIAGNOSIS — F1090 Alcohol use, unspecified, uncomplicated: Secondary | ICD-10-CM | POA: Diagnosis not present

## 2023-12-23 MED ORDER — LORAZEPAM 1 MG PO TABS
1.0000 mg | ORAL_TABLET | Freq: Two times a day (BID) | ORAL | Status: DC
  Administered 2023-12-23 (×2): 1 mg via ORAL
  Filled 2023-12-23 (×2): qty 1

## 2023-12-23 MED ORDER — LORAZEPAM 1 MG PO TABS
1.0000 mg | ORAL_TABLET | Freq: Once | ORAL | Status: DC
Start: 2023-12-25 — End: 2023-12-24

## 2023-12-23 MED ORDER — TRAZODONE HCL 50 MG PO TABS
50.0000 mg | ORAL_TABLET | Freq: Every evening | ORAL | Status: DC | PRN
  Administered 2023-12-23 – 2023-12-24 (×2): 50 mg via ORAL
  Filled 2023-12-23 (×2): qty 1

## 2023-12-23 NOTE — ED Notes (Signed)
 Patient is sleeping. Respirations equal and unlabored, skin warm and dry. No change in assessment or acuity. Routine safety checks conducted according to facility protocol. Will continue to monitor for safety.

## 2023-12-23 NOTE — Group Note (Signed)
 Group Topic: Change and Accountability  Group Date: 12/23/2023 Start Time: 0945 End Time: 1012 Facilitators: Vonzell Schlatter B  Department: [facility]  Number of Participants: 6  Group Focus: daily focus and feeling awareness/expression Treatment Modality:  Psychoeducation Interventions utilized were patient education and support Purpose: increase insight and reinforce self-care  Name: [name] Date of Birth: 1971/09/06  MR: [id number]    Level of Participation: active Quality of Participation: attentive and cooperative Interactions with others: gave feedback Mood/Affect: positive Triggers (if applicable): N/A Cognition: coherent/clear Progress: Moderate Response: patient is just wanting help with his addiction Plan: follow-up needed  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) 12/22/2023   Adjustment disorder with mixed anxiety and depressed mood 10/27/2023   Metabolic acidosis 10/22/2023   Alcohol use disorder, severe, in controlled environment (HCC) 03/25/2023   DTs (delirium tremens) (HCC) 03/25/2023   PTSD (post-traumatic stress disorder) 03/24/2023   Alcohol abuse with alcohol-induced mood disorder (HCC) 11/07/2018   MDD (major depressive disorder), recurrent severe, without psychosis (HCC) 02/17/2018   Alcohol use disorder, severe, dependence (HCC) 12/11/2017   Alcohol withdrawal (HCC) 12/10/2017   Seizure due to alcohol withdrawal (HCC) 08/30/2017   Alcohol withdrawal seizure (HCC) 08/30/2017   Tobacco abuse    Delirium tremens (HCC) 03/02/2017   GI bleed 10/06/2013   Marijuana abuse 10/06/2013   Suicidal ideation 10/05/2013   Alcohol abuse 10/26/2011   Depression with suicidal ideation 10/26/2011

## 2023-12-23 NOTE — ED Notes (Signed)
 Patient in hallway calm and composed. No acute distress noted. No concerns voiced. Informed patient to notify staff with any needs or assistance. Patient verbalized understanding or agreement. Safety checks in place per facility policy.

## 2023-12-23 NOTE — ED Notes (Signed)
 PRN agitation protocol (Haldol 5 mg PO and Benadryl 50 mg PO) given due to patient restlessness, pacing and irritability. Medication administered with no complications. Environment secured, safety checks in place per facility policy.

## 2023-12-23 NOTE — ED Notes (Signed)
 Patient sitting in dayroom interacting with peers. No acute distress noted. No concerns voiced. Informed patient to notify staff with any needs or assistance. Patient verbalized understanding or agreement. Safety checks in place per facility policy.

## 2023-12-23 NOTE — ED Notes (Signed)
 Patient alert & oriented x4. Denies intent to harm self or others when asked. Denies A/VH. Patient denies pain at this time. Scheduled medications administered with no complications. PRN agitation protocol (Haldol 5 mg PO and Benadryl 50 mg PO) administered at 1303 due to patient irritability and pacing. No acute distress noted. Support and encouragement provided. Routine safety checks conducted per facility protocol. Encouraged patient to notify staff if any thoughts of harm towards self or others arise. Patient verbalizes understanding and agreement.

## 2023-12-23 NOTE — ED Notes (Signed)
 Pt is in the dayroom watching TV with peers. Pt denies SI/HI/AVH. Pt has no further complain.No acute distress noted. Will continue to monitor for safety and provide support.

## 2023-12-23 NOTE — ED Notes (Signed)
 Pt presents to [facility] after being transferred from [facility]. Pt is IVC''d. Pt reports being intoxicated with alcohol and saying things he didn't mean them. Admission process completed. Pt is oriented to the unit and provided with meal. Pt denies SI/HI/AVH. Will continue to monitor for safety and provide support.

## 2023-12-23 NOTE — Group Note (Signed)
 Group Topic: Social Support  Group Date: 12/23/2023 Start Time: 1400 End Time: 1500 Facilitators: Raeshawn Tafolla, Jacklynn Barnacle, RN  Department: [facility]  Number of Participants: 7  Group Focus: community group Treatment Modality:  Interpersonal Therapy Interventions utilized were support Purpose: express feelings  Name: [name] Date of Birth: [birth year]  MR: [id number]    Level of Participation: active Quality of Participation: attentive and cooperative Interactions with others: gave feedback Mood/Affect: appropriate Triggers (if applicable): None identified Cognition: coherent/clear Progress: Gaining insight Response: Patient actively attended group Plan: patient will be encouraged to continue to attend groups/programs  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) 12/22/2023   Adjustment disorder with mixed anxiety and depressed mood 10/27/2023   Metabolic acidosis 10/22/2023   Alcohol use disorder, severe, in controlled environment (HCC) 03/25/2023   DTs (delirium tremens) (HCC) 03/25/2023   PTSD (post-traumatic stress disorder) 03/24/2023   Alcohol abuse with alcohol-induced mood disorder (HCC) 11/07/2018   MDD (major depressive disorder), recurrent severe, without psychosis (HCC) 02/17/2018   Alcohol use disorder, severe, dependence (HCC) 12/11/2017   Alcohol withdrawal (HCC) 12/10/2017   Seizure due to alcohol withdrawal (HCC) 08/30/2017   Alcohol withdrawal seizure (HCC) 08/30/2017   Tobacco abuse    Delirium tremens (HCC) 03/02/2017   GI bleed 10/06/2013   Marijuana abuse 10/06/2013   Suicidal ideation 10/05/2013   Alcohol abuse 10/26/2011   Depression with suicidal ideation 10/26/2011

## 2023-12-23 NOTE — Group Note (Signed)
 Group Topic: Communication  Group Date: 12/22/2023 Start Time: 2000 End Time: 2015 Facilitators: Lauro Jacqueline Spofford, NT  Department: [facility]  Number of Participants: 9  Group Focus: check in and clarity of thought Treatment Modality:  Cognitive Behavioral Therapy Interventions utilized were support Purpose: express feelings  Name: [name] Date of Birth: [birth year]  MR: [id number]    Level of Participation: pt did not attend group Quality of Participation: cooperative Interactions with others: gave feedback Mood/Affect: appropriate Triggers (if applicable): N/A Cognition: coherent/clear Progress: Gaining insight Response: N/A Plan: patient will be encouraged to attend group sessions.  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) 12/22/2023   Adjustment disorder with mixed anxiety and depressed mood 10/27/2023   Metabolic acidosis 10/22/2023   Alcohol use disorder, severe, in controlled environment (HCC) 03/25/2023   DTs (delirium tremens) (HCC) 03/25/2023   PTSD (post-traumatic stress disorder) 03/24/2023   Alcohol abuse with alcohol-induced mood disorder (HCC) 11/07/2018   MDD (major depressive disorder), recurrent severe, without psychosis (HCC) 02/17/2018   Alcohol use disorder, severe, dependence (HCC) 12/11/2017   Alcohol withdrawal (HCC) 12/10/2017   Seizure due to alcohol withdrawal (HCC) 08/30/2017   Alcohol withdrawal seizure (HCC) 08/30/2017   Tobacco abuse    Delirium tremens (HCC) 03/02/2017   GI bleed 10/06/2013   Marijuana abuse 10/06/2013   Suicidal ideation 10/05/2013   Alcohol abuse 10/26/2011   Depression with suicidal ideation 10/26/2011

## 2023-12-23 NOTE — Group Note (Signed)
 Group Topic: Social Support  Group Date: 12/23/2023 Start Time: 2000 End Time: 2030 Facilitators: Rae Lips B  Department: [facility]  Number of Participants: 5  Group Focus: activities of daily living skills, anxiety, check in, daily focus, and social skills Treatment Modality:  Individual Therapy Interventions utilized were patient education and support Purpose: express feelings, increase insight, and regain self-worth  Name: [name] Date of Birth: Jul 22, 1971  MR: [id number]    Level of Participation: active Quality of Participation: cooperative Interactions with others: gave feedback Mood/Affect: appropriate Triggers (if applicable): NA Cognition: coherent/clear Progress: Gaining insight Response: NA Plan: patient will be encouraged to keep going to groups.   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) 12/22/2023   Adjustment disorder with mixed anxiety and depressed mood 10/27/2023   Metabolic acidosis 10/22/2023   Alcohol use disorder, severe, in controlled environment (HCC) 03/25/2023   DTs (delirium tremens) (HCC) 03/25/2023   PTSD (post-traumatic stress disorder) 03/24/2023   Alcohol abuse with alcohol-induced mood disorder (HCC) 11/07/2018   MDD (major depressive disorder), recurrent severe, without psychosis (HCC) 02/17/2018   Alcohol use disorder, severe, dependence (HCC) 12/11/2017   Alcohol withdrawal (HCC) 12/10/2017   Seizure due to alcohol withdrawal (HCC) 08/30/2017   Alcohol withdrawal seizure (HCC) 08/30/2017   Tobacco abuse    Delirium tremens (HCC) 03/02/2017   GI bleed 10/06/2013   Marijuana abuse 10/06/2013   Suicidal ideation 10/05/2013   Alcohol abuse 10/26/2011   Depression with suicidal ideation 10/26/2011

## 2023-12-23 NOTE — ED Provider Notes (Signed)
 Facility Based Crisis Admission H&P  Date: 12/23/23 Patient Name: [name] MRN: [id number] Chief Complaint: Alcohol abuse,  Diagnoses:  Final diagnoses:  Alcohol use disorder   [name] is a 53 y.o. male  with a past psychiatric history of PTSD, alcohol use disorder, and numerous hospitalizations for alcohol withdrawal and substance rehab.  The patient initially presented to [facility] on 12/21/2023, brought by [employer], police were called as patient was out on the street expressing suicidal ideations and asking to be given a gun.  The patient was placed under IVC and transferred to [facility] on 12/23/2023 for detox and crisis stabilization.   HPI:  Patient was evaluated on the unit this morning.  He is frustrated with his hospitalization, reporting that he has had trouble with his drinking, and only made suicidal remarks when intoxicated.  Patient reports that now that he is sober he is no longer experiencing these thoughts and would like to be discharged today.  He admits to calling 911 and behaving erratically when the cops arrived to his home.  He does admit that when he is under the influence of alcohol, he cares very little about what happens to him and is okay with the idea of dying.  He denies any recent suicide attempts, denies currently any plan or intent to hurt himself.  Patient reports vague stressors initially, describes generalized dissatisfaction with life and that he is tired of the monotony in his daily routines he does note that he has a toxic 5-year relationship with a girlfriend, who "drives him crazy".  He reports this partner also drives him to drink, and he often thinks about breaking up with her.  He states "when I leave, I should probably tell her things are over".  Patient lacks insight into the severity of his drinking, minimizes the amount he consumes.  He states to this interviewer that he rarely drinks, reports it is less than once a month.  When informed that patient  was brought here under IVC, he becomes furious, shouts "they always do this to me".  Patient becomes ruminative about how cold he is, and accuses staff of not addressing his withdrawal symptoms appropriately.  He denies any nausea, sensitivity to light, headaches, diaphoresis, gastrointestinal upset, or palpitations.   Substance Use Hx: Alcohol:  Reports he typically drinks on important dates  Hx of seizures/Dts: reports it has been about 4-5 years ago since his last. Denies hx of DTs Tobacco: 1.5 ppd, for 29 years Cannabis:smoke 1 joint nightly to help him sleep Cocaine: denies Methamphetamines: denies Psilocybin (mushrooms): denies Ecstasy (MDMA / molly): denies LSD (acid): never tried: denies Opiates (fentanyl / heroin): denies Benzos (Xanax, Klonopin): deniesdenies IVDU: denies Rehab hx:  Reports he has been to [place] for detox in the past, [place], [place]. Most recently was at [facility]  Past Psychiatric Hx: Current Psychiatrist: None Current Therapist[facility] Previous Psychiatric Diagnoses: PTSD, alcohol use disorder Psychiatric Medications: Current None Past Paroxetine and venlafaxine but reports these made things worse Psychiatric Hospitalization hx: Numerous hospitalizations for substance use disorder but no other reasons. Psychotherapy hx: Therapy at [facility] but does not describe what program Neuromodulation history: Denies History of suicide: Denies History of homicide or aggression: Denies  Past Medical History: PCP: [facility] Medical Dx: None Medications: None Allergies: Paroxetine, venlafaxine, NicoDerm Hospitalizations: Numerous for alcohol-related reasons Surgeries: Numerous hernia surgeries Trauma: Denies Seizures: Denies   Family Medical History: Unsure   Family Psychiatric History:  Psychiatric Dx: PTSD in father Suicide Hx: Denies Violence/Aggression: Denies Substance use: Yes in first-degree  relatives  Social History: Currently live in [place] by himself Social Support: son Education: some college Occupational hx: retired, on SSI Marital Status: divorced Children: has a 85 yo son Legal: denies upcoming court dates,  Military:served in the [employer] for 3 years. Connected with [facility].  Access to firearms: denies   PHQ 2-9:   Flowsheet Row ED from 12/22/2023 in [facility] ED from 12/21/2023 in [facility] Emergency Department at [facility] Admission (Discharged) [facility]  C-SSRS RISK CATEGORY High Risk High Risk High Risk       Screenings    Flowsheet Row Most Recent Value  CIWA-Ar Total 5       Total Time spent with patient: 1.5 hours  Musculoskeletal  Strength & Muscle Tone: within normal limits Gait & Station: normal Patient leans: N/A  Psychiatric Specialty Exam  Presentation General Appearance:  Appropriate for Environment  Eye Contact: Good  Speech: Clear and Coherent; Normal Rate  Speech Volume: Normal  Handedness: -- (not assessed)   Mood and Affect  Mood: Irritable; Anxious  Affect: -- (irritable)   Thought Process  Thought Processes: Linear  Descriptions of Associations:Intact  Orientation:None  Thought Content:Logical    Hallucinations:Hallucinations: None  Ideas of Reference:None  Suicidal Thoughts:Suicidal Thoughts: No  Homicidal Thoughts:Homicidal Thoughts: No   Sensorium  Memory: Immediate Good; Recent Good; Remote Good  Judgment: Poor  Insight: Poor   Executive Functions  Concentration: Fair  Attention Span: Fair  Recall: Fair  Fund of Knowledge: Fair  Language: Fair   Psychomotor Activity  Psychomotor Activity:Psychomotor Activity: Normal   Assets  Assets: Desire for Improvement; Resilience; Communication Skills   Sleep  Sleep:Sleep: Fair    Physical Exam Vitals and nursing note  reviewed.  Constitutional:      General: He is not in acute distress.    Appearance: He is not ill-appearing.  HENT:     Head: Normocephalic and atraumatic.  Eyes:     Extraocular Movements: Extraocular movements intact.     Conjunctiva/sclera: Conjunctivae normal.  Pulmonary:     Effort: Pulmonary effort is normal. No respiratory distress.  Skin:    General: Skin is warm and dry.    Review of Systems  Constitutional: Negative.   Cardiovascular: Negative.   Gastrointestinal: Negative.   Musculoskeletal: Negative.   Neurological:  Negative for tremors.  Psychiatric/Behavioral:  Positive for substance abuse. Negative for depression, hallucinations and suicidal ideas. The patient is nervous/anxious.     Blood pressure (!) 139/93, pulse 72, temperature 98 F (36.7 C), temperature source Oral, resp. rate 19, SpO2 95%. There is no height or weight on file to calculate BMI.  Last Labs:  Admission on 12/21/2023, Discharged on 12/22/2023  Component Date Value Ref Range Status   WBC 12/21/2023 10.4  4.0 - 10.5 K/uL Final   RBC 12/21/2023 6.27 (H)  4.22 - 5.81 MIL/uL Final   Hemoglobin 12/21/2023 16.8  13.0 - 17.0 g/dL Final   HCT 95/18/8416 49.9  39.0 - 52.0 % Final   MCV 12/21/2023 79.6 (L)  80.0 - 100.0 fL Final   MCH 12/21/2023 26.8  26.0 - 34.0 pg Final   MCHC 12/21/2023 33.7  30.0 - 36.0 g/dL Final   RDW 60/63/0160 14.9  11.5 - 15.5 % Final   Platelets 12/21/2023 276  150 - 400 K/uL Final   nRBC  12/21/2023 0.0  0.0 - 0.2 % Final   Neutrophils Relative % 12/21/2023 41  % Final   Neutro Abs 12/21/2023 4.3  1.7 - 7.7 K/uL Final   Lymphocytes Relative 12/21/2023 51  % Final   Lymphs Abs 12/21/2023 5.3 (H)  0.7 - 4.0 K/uL Final   Monocytes Relative 12/21/2023 6  % Final   Monocytes Absolute 12/21/2023 0.6  0.1 - 1.0 K/uL Final   Eosinophils Relative 12/21/2023 1  % Final   Eosinophils Absolute 12/21/2023 0.1  0.0 - 0.5 K/uL Final   Basophils Relative 12/21/2023 1  % Final    Basophils Absolute 12/21/2023 0.1  0.0 - 0.1 K/uL Final   Immature Granulocytes 12/21/2023 0  % Final   Abs Immature Granulocytes 12/21/2023 0.03  0.00 - 0.07 K/uL Final   Performed at [facility], [address] [place], [place] [address]   Sodium 12/21/2023 138  135 - 145 mmol/L Final   Potassium 12/21/2023 3.5  3.5 - 5.1 mmol/L Final   Chloride 12/21/2023 103  98 - 111 mmol/L Final   CO2 12/21/2023 17 (L)  22 - 32 mmol/L Final   Glucose, Bld 12/21/2023 74  70 - 99 mg/dL Final   Glucose reference range applies only to samples taken after fasting for at least 8 hours.   BUN 12/21/2023 8  6 - 20 mg/dL Final   Creatinine, Ser 12/21/2023 0.76  0.61 - 1.24 mg/dL Final   Calcium 60/45/4098 8.7 (L)  8.9 - 10.3 mg/dL Final   Total Protein 11/91/4782 8.1  6.5 - 8.1 g/dL Final   Albumin 95/62/1308 4.5  3.5 - 5.0 g/dL Final   AST 65/78/4696 34  15 - 41 U/L Final   ALT 12/21/2023 26  0 - 44 U/L Final   Alkaline Phosphatase 12/21/2023 62  38 - 126 U/L Final   Total Bilirubin 12/21/2023 0.7  0.0 - 1.2 mg/dL Final   GFR, Estimated 12/21/2023 >60  >60 mL/min Final   Comment: (NOTE) Calculated using the CKD-EPI Creatinine Equation (2021)    Anion gap 12/21/2023 18 (H)  5 - 15 Final   Performed at [facility], [address], [place], [place] [address]   Opiates 12/21/2023 NONE DETECTED  NONE DETECTED Final   Cocaine 12/21/2023 NONE DETECTED  NONE DETECTED Final   Benzodiazepines 12/21/2023 NONE DETECTED  NONE DETECTED Final   Amphetamines 12/21/2023 NONE DETECTED  NONE DETECTED Final   Tetrahydrocannabinol 12/21/2023 POSITIVE (A)  NONE DETECTED Final   Barbiturates 12/21/2023 NONE DETECTED  NONE DETECTED Final   Comment: (NOTE) DRUG SCREEN FOR MEDICAL PURPOSES ONLY.  IF CONFIRMATION IS NEEDED FOR ANY PURPOSE, NOTIFY LAB WITHIN 5 DAYS.  LOWEST DETECTABLE LIMITS FOR URINE DRUG SCREEN Drug Class                     Cutoff (ng/mL) Amphetamine and metabolites    1000 Barbiturate  and metabolites    200 Benzodiazepine                 200 Opiates and metabolites        300 Cocaine and metabolites        300 THC                            50 Performed at [facility], [address], [place], [place] [address]    Alcohol, Ethyl (B) 12/21/2023 377 (  HH)  <10 mg/dL Final   Comment: CRITICAL RESULT CALLED TO, READ BACK BY AND VERIFIED WITH ESTHER RAYMONDS RN @1736  ON 12/21/23 BY [facility] (NOTE) Lowest detectable limit for serum alcohol is 10 mg/dL.  For medical purposes only. Performed at [facility], [address] [place], [place] [address]    Color, Urine 12/21/2023 STRAW (A)  YELLOW Final   APPearance 12/21/2023 CLEAR  CLEAR Final   Specific Gravity, Urine 12/21/2023 1.003 (L)  1.005 - 1.030 Final   pH 12/21/2023 5.0  5.0 - 8.0 Final   Glucose, UA 12/21/2023 NEGATIVE  NEGATIVE mg/dL Final   Hgb urine dipstick 12/21/2023 SMALL (A)  NEGATIVE Final   Bilirubin Urine 12/21/2023 NEGATIVE  NEGATIVE Final   Ketones, ur 12/21/2023 NEGATIVE  NEGATIVE mg/dL Final   Protein, ur 84/69/6295 NEGATIVE  NEGATIVE mg/dL Final   Nitrite 28/41/3244 NEGATIVE  NEGATIVE Final   Leukocytes,Ua 12/21/2023 NEGATIVE  NEGATIVE Final   RBC / HPF 12/21/2023 0-5  0 - 5 RBC/hpf Final   WBC, UA 12/21/2023 0-5  0 - 5 WBC/hpf Final   Bacteria, UA 12/21/2023 NONE SEEN  NONE SEEN Final   Squamous Epithelial / HPF 12/21/2023 0-5  0 - 5 /HPF Final   Performed at [facility], [address] [place], [place] [address]  Admission on 10/22/2023, Discharged on 10/26/2023  Component Date Value Ref Range Status   Sodium 10/22/2023 142  135 - 145 mmol/L Final   Potassium 10/22/2023 3.8  3.5 - 5.1 mmol/L Final   Chloride 10/22/2023 108  98 - 111 mmol/L Final   CO2 10/22/2023 19 (L)  22 - 32 mmol/L Final   Glucose, Bld 10/22/2023 89  70 - 99 mg/dL Final   Glucose reference range applies only to samples taken after fasting for at least 8 hours.   BUN 10/22/2023 8  6 - 20 mg/dL Final    Creatinine, Ser 10/22/2023 0.64  0.61 - 1.24 mg/dL Final   Calcium 25/36/6440 9.1  8.9 - 10.3 mg/dL Final   Total Protein 34/74/2595 8.2 (H)  6.5 - 8.1 g/dL Final   Albumin 63/87/5643 4.7  3.5 - 5.0 g/dL Final   AST 32/95/1884 35  15 - 41 U/L Final   ALT 10/22/2023 26  0 - 44 U/L Final   Alkaline Phosphatase 10/22/2023 56  38 - 126 U/L Final   Total Bilirubin 10/22/2023 0.7  0.0 - 1.2 mg/dL Final   GFR, Estimated 10/22/2023 >60  >60 mL/min Final   Comment: (NOTE) Calculated using the CKD-EPI Creatinine Equation (2021)    Anion gap 10/22/2023 15  5 - 15 Final   Performed at [facility], [address], [place], [place] [address]   Alcohol, Ethyl (B) 10/22/2023 275 (H)  <10 mg/dL Final   Comment: (NOTE) Lowest detectable limit for serum alcohol is 10 mg/dL.  For medical purposes only. Performed at [facility], [address] [place], [place] [address]    Opiates 10/22/2023 NONE DETECTED  NONE DETECTED Final   Cocaine 10/22/2023 NONE DETECTED  NONE DETECTED Final   Benzodiazepines 10/22/2023 NONE DETECTED  NONE DETECTED Final   Amphetamines 10/22/2023 NONE DETECTED  NONE DETECTED Final   Tetrahydrocannabinol 10/22/2023 POSITIVE (A)  NONE DETECTED Final   Barbiturates 10/22/2023 NONE DETECTED  NONE DETECTED Final   Comment: (NOTE) DRUG SCREEN FOR MEDICAL PURPOSES ONLY.  IF CONFIRMATION IS NEEDED FOR ANY PURPOSE, NOTIFY LAB WITHIN 5 DAYS.  LOWEST DETECTABLE LIMITS FOR  URINE DRUG SCREEN Drug Class                     Cutoff (ng/mL) Amphetamine and metabolites    1000 Barbiturate and metabolites    200 Benzodiazepine                 200 Opiates and metabolites        300 Cocaine and metabolites        300 THC                            50 Performed at [facility], [address] [place], [place] [address]    WBC 10/22/2023 7.8  4.0 - 10.5 K/uL Final   RBC 10/22/2023 6.50 (H)  4.22 - 5.81 MIL/uL Final   Hemoglobin  10/22/2023 17.0  13.0 - 17.0 g/dL Final   HCT 52/84/1324 51.8  39.0 - 52.0 % Final   MCV 10/22/2023 79.7 (L)  80.0 - 100.0 fL Final   MCH 10/22/2023 26.2  26.0 - 34.0 pg Final   MCHC 10/22/2023 32.8  30.0 - 36.0 g/dL Final   RDW 40/07/2724 15.0  11.5 - 15.5 % Final   Platelets 10/22/2023 276  150 - 400 K/uL Final   nRBC 10/22/2023 0.0  0.0 - 0.2 % Final   Neutrophils Relative % 10/22/2023 54  % Final   Neutro Abs 10/22/2023 4.2  1.7 - 7.7 K/uL Final   Lymphocytes Relative 10/22/2023 39  % Final   Lymphs Abs 10/22/2023 3.1  0.7 - 4.0 K/uL Final   Monocytes Relative 10/22/2023 5  % Final   Monocytes Absolute 10/22/2023 0.4  0.1 - 1.0 K/uL Final   Eosinophils Relative 10/22/2023 1  % Final   Eosinophils Absolute 10/22/2023 0.1  0.0 - 0.5 K/uL Final   Basophils Relative 10/22/2023 1  % Final   Basophils Absolute 10/22/2023 0.0  0.0 - 0.1 K/uL Final   Immature Granulocytes 10/22/2023 0  % Final   Abs Immature Granulocytes 10/22/2023 0.02  0.00 - 0.07 K/uL Final   Performed at [facility], [name], [place], [place] [address]   Acetaminophen (Tylenol), Serum 10/22/2023 <10 (L)  10 - 30 ug/mL Final   Comment: (NOTE) Therapeutic concentrations vary significantly. A range of 10-30 ug/mL  may be an effective concentration for many patients. However, some  are best treated at concentrations outside of this range. Acetaminophen concentrations >150 ug/mL at 4 hours after ingestion  and >50 ug/mL at 12 hours after ingestion are often associated with  toxic reactions.  Performed at [facility], [address] [place], [place] [address]    Salicylate Lvl 10/22/2023 <7.0 (L)  7.0 - 30.0 mg/dL Final   Performed at [facility], [address] [place], [place] [address]   Sodium 10/23/2023 139  135 - 145 mmol/L Final   Potassium 10/23/2023 3.7  3.5 - 5.1 mmol/L Final   Chloride 10/23/2023 107  98 - 111 mmol/L Final   CO2 10/23/2023  23  22 - 32 mmol/L Final   Glucose, Bld 10/23/2023 79  70 - 99 mg/dL Final   Glucose reference range applies only to samples taken after fasting for at least 8 hours.   BUN 10/23/2023 16  6 - 20 mg/dL Final   Creatinine, Ser 10/23/2023 0.72  0.61 - 1.24 mg/dL Final   Calcium 38/75/6433 8.9  8.9 -  10.3 mg/dL Final   Total Protein 40/98/1191 6.4 (L)  6.5 - 8.1 g/dL Final   Albumin 47/82/9562 3.7  3.5 - 5.0 g/dL Final   AST 13/05/6577 35  15 - 41 U/L Final   ALT 10/23/2023 24  0 - 44 U/L Final   Alkaline Phosphatase 10/23/2023 48  38 - 126 U/L Final   Total Bilirubin 10/23/2023 1.6 (H)  0.0 - 1.2 mg/dL Final   GFR, Estimated 10/23/2023 >60  >60 mL/min Final   Comment: (NOTE) Calculated using the CKD-EPI Creatinine Equation (2021)    Anion gap 10/23/2023 9  5 - 15 Final   Performed at [facility] [address], [place], [place] [address]   WBC 10/23/2023 6.6  4.0 - 10.5 K/uL Final   RBC 10/23/2023 5.43  4.22 - 5.81 MIL/uL Final   Hemoglobin 10/23/2023 14.5  13.0 - 17.0 g/dL Final   HCT 95/28/4132 43.6  39.0 - 52.0 % Final   MCV 10/23/2023 80.3  80.0 - 100.0 fL Final   MCH 10/23/2023 26.7  26.0 - 34.0 pg Final   MCHC 10/23/2023 33.3  30.0 - 36.0 g/dL Final   RDW 44/10/270 14.3  11.5 - 15.5 % Final   Platelets 10/23/2023 218  150 - 400 K/uL Final   nRBC 10/23/2023 0.0  0.0 - 0.2 % Final   Performed at [facility], [address], [place], [place] [address]   TSH 10/23/2023 0.783  0.350 - 4.500 uIU/mL Final   Comment: Performed by a 3rd Generation assay with a functional sensitivity of <=0.01 uIU/mL. Performed at [facility], [address], [place], [place] [address]    Vitamin B-12 10/23/2023 199  180 - 914 pg/mL Final   Comment: (NOTE) This assay is not validated for testing neonatal or myeloproliferative syndrome specimens for Vitamin B12 levels. Performed at [facility], [address] [place], [place] [address]    Alcohol, Ethyl (B) 10/23/2023 12 (H)  <10 mg/dL Final   Comment: (NOTE) Lowest detectable limit for serum alcohol is 10 mg/dL.  For medical purposes only. Performed at [facility], [address] [place], [place] [address]    WBC 10/24/2023 5.4  4.0 - 10.5 K/uL Final   RBC 10/24/2023 5.59  4.22 - 5.81 MIL/uL Final   Hemoglobin 10/24/2023 15.1  13.0 - 17.0 g/dL Final   HCT 64/33/2951 44.7  39.0 - 52.0 % Final   MCV 10/24/2023 80.0  80.0 - 100.0 fL Final   MCH 10/24/2023 27.0  26.0 - 34.0 pg Final   MCHC 10/24/2023 33.8  30.0 - 36.0 g/dL Final   RDW 88/41/6606 14.1  11.5 - 15.5 % Final   Platelets 10/24/2023 211  150 - 400 K/uL Final   nRBC 10/24/2023 0.0  0.0 - 0.2 % Final   Performed at [facility], [address], [place], [place] [address]   Sodium 10/24/2023 139  135 - 145 mmol/L Final   Potassium 10/24/2023 3.6  3.5 - 5.1 mmol/L Final   Chloride 10/24/2023 106  98 - 111 mmol/L Final   CO2 10/24/2023 28  22 - 32 mmol/L Final   Glucose, Bld 10/24/2023 93  70 - 99 mg/dL Final   Glucose reference range applies only to samples taken after fasting for at least 8 hours.   BUN 10/24/2023 9  6 - 20 mg/dL Final   Creatinine, Ser 10/24/2023 0.68  0.61 - 1.24 mg/dL Final  Calcium 10/24/2023 9.3  8.9 - 10.3 mg/dL Final   Total Protein 16/07/9603 7.0  6.5 - 8.1 g/dL Final   Albumin 54/06/8118 4.2  3.5 - 5.0 g/dL Final   AST 14/78/2956 56 (H)  15 - 41 U/L Final   ALT 10/24/2023 41  0 - 44 U/L Final   Alkaline Phosphatase 10/24/2023 51  38 - 126 U/L Final   Total Bilirubin 10/24/2023 1.3 (H)  0.0 - 1.2 mg/dL Final   GFR, Estimated 10/24/2023 >60  >60 mL/min Final   Comment: (NOTE) Calculated using the CKD-EPI Creatinine Equation (2021)    Anion gap 10/24/2023 5  5 - 15 Final   Performed at [facility], [address] [place], [place] [address]   Magnesium 10/24/2023 2.2  1.7 - 2.4 mg/dL Final    Performed at [facility], [address] [place], [place] [address]   Folate 10/24/2023 12.3  >5.9 ng/mL Final   Performed at [facility], [address], [place], [place] [address]   WBC 10/25/2023 5.1  4.0 - 10.5 K/uL Final   RBC 10/25/2023 5.67  4.22 - 5.81 MIL/uL Final   Hemoglobin 10/25/2023 15.2  13.0 - 17.0 g/dL Final   HCT 52/84/1324 45.9  39.0 - 52.0 % Final   MCV 10/25/2023 81.0  80.0 - 100.0 fL Final   MCH 10/25/2023 26.8  26.0 - 34.0 pg Final   MCHC 10/25/2023 33.1  30.0 - 36.0 g/dL Final   RDW 40/07/2724 14.1  11.5 - 15.5 % Final   Platelets 10/25/2023 206  150 - 400 K/uL Final   nRBC 10/25/2023 0.0  0.0 - 0.2 % Final   Performed at [facility], [address], [place], [place] [address]   Sodium 10/25/2023 134 (L)  135 - 145 mmol/L Final   Potassium 10/25/2023 3.6  3.5 - 5.1 mmol/L Final   Chloride 10/25/2023 105  98 - 111 mmol/L Final   CO2 10/25/2023 24  22 - 32 mmol/L Final   Glucose, Bld 10/25/2023 93  70 - 99 mg/dL Final   Glucose reference range applies only to samples taken after fasting for at least 8 hours.   BUN 10/25/2023 10  6 - 20 mg/dL Final   Creatinine, Ser 10/25/2023 0.68  0.61 - 1.24 mg/dL Final   Calcium 03/47/4259 8.9  8.9 - 10.3 mg/dL Final   Total Protein 56/38/7564 6.7  6.5 - 8.1 g/dL Final   Albumin 33/29/5188 3.7  3.5 - 5.0 g/dL Final   AST 41/66/0630 37  15 - 41 U/L Final   ALT 10/25/2023 34  0 - 44 U/L Final   Alkaline Phosphatase 10/25/2023 46  38 - 126 U/L Final   Total Bilirubin 10/25/2023 1.1  0.0 - 1.2 mg/dL Final   GFR, Estimated 10/25/2023 >60  >60 mL/min Final   Comment: (NOTE) Calculated using the CKD-EPI Creatinine Equation (2021)    Anion gap 10/25/2023 5  5 - 15 Final   Performed at [facility], [address], [place], [place] [address]   Magnesium 10/25/2023 2.1  1.7 - 2.4 mg/dL Final   Performed at [facility], [address] [place], [place] [address]   WBC 10/26/2023 5.3  4.0 - 10.5 K/uL Final   RBC 10/26/2023 5.72  4.22 - 5.81 MIL/uL Final   Hemoglobin 10/26/2023 15.3  13.0 - 17.0 g/dL Final   HCT 57/32/2025 46.6  39.0 - 52.0 % Final  MCV 10/26/2023 81.5  80.0 - 100.0 fL Final   MCH 10/26/2023 26.7  26.0 - 34.0 pg Final   MCHC 10/26/2023 32.8  30.0 - 36.0 g/dL Final   RDW 40/98/1191 14.0  11.5 - 15.5 % Final   Platelets 10/26/2023 199  150 - 400 K/uL Final   nRBC 10/26/2023 0.0  0.0 - 0.2 % Final   Performed at [facility], [address] [place], [place] [address]   Sodium 10/26/2023 140  135 - 145 mmol/L Final   Potassium 10/26/2023 3.6  3.5 - 5.1 mmol/L Final   Chloride 10/26/2023 105  98 - 111 mmol/L Final   CO2 10/26/2023 24  22 - 32 mmol/L Final   Glucose, Bld 10/26/2023 87  70 - 99 mg/dL Final   Glucose reference range applies only to samples taken after fasting for at least 8 hours.   BUN 10/26/2023 13  6 - 20 mg/dL Final   Creatinine, Ser 10/26/2023 0.56 (L)  0.61 - 1.24 mg/dL Final   Calcium 56/21/3086 8.9  8.9 - 10.3 mg/dL Final   Total Protein 57/84/6962 6.8  6.5 - 8.1 g/dL Final   Albumin 95/28/4132 3.8  3.5 - 5.0 g/dL Final   AST 44/10/270 26  15 - 41 U/L Final   ALT 10/26/2023 28  0 - 44 U/L Final   Alkaline Phosphatase 10/26/2023 44  38 - 126 U/L Final   Total Bilirubin 10/26/2023 0.7  0.0 - 1.2 mg/dL Final   GFR, Estimated 10/26/2023 >60  >60 mL/min Final   Comment: (NOTE) Calculated using the CKD-EPI Creatinine Equation (2021)    Anion gap 10/26/2023 11  5 - 15 Final   Performed at [facility], [address] [place], [place] [address]   Magnesium 10/26/2023 2.2  1.7 - 2.4 mg/dL Final   Performed at [facility], [address], [place], [place] [address]    Allergies: Effexor [venlafaxine], Paroxetine, and Paxil [paroxetine hcl]  Medications:  Facility Ordered Medications  Medication   acetaminophen  (TYLENOL) tablet 650 mg   alum & mag hydroxide-simeth (MAALOX/MYLANTA) 200-200-20 MG/5ML suspension 30 mL   magnesium hydroxide (MILK OF MAGNESIA) suspension 30 mL   haloperidol (HALDOL) tablet 5 mg   And   diphenhydrAMINE (BENADRYL) capsule 50 mg   haloperidol lactate (HALDOL) injection 5 mg   And   diphenhydrAMINE (BENADRYL) injection 50 mg   And   LORazepam (ATIVAN) injection 2 mg   haloperidol lactate (HALDOL) injection 10 mg   And   diphenhydrAMINE (BENADRYL) injection 50 mg   And   LORazepam (ATIVAN) injection 2 mg   thiamine (VITAMIN B1) tablet 100 mg   multivitamin with minerals tablet 1 tablet   LORazepam (ATIVAN) tablet 1 mg   hydrOXYzine (ATARAX) tablet 25 mg   loperamide (IMODIUM) capsule 2-4 mg   ondansetron (ZOFRAN-ODT) disintegrating tablet 4 mg   LORazepam (ATIVAN) tablet 1 mg   Followed by   Melene Muller ON 12/25/2023] LORazepam (ATIVAN) tablet 1 mg   traZODone (DESYREL) tablet 50 mg   PTA Medications  Medication Sig   traZODone (DESYREL) 50 MG tablet Take 1 tablet (50 mg total) by mouth at bedtime as needed for sleep (for difficulty initiating sleep in patients with depressive symptoms). (Patient not taking: Reported on 12/22/2023)   gabapentin (NEURONTIN) 300 MG capsule Take 1 capsule (300 mg total) by mouth 3 (three) times daily. (Patient not taking: Reported on 12/22/2023)   hydrOXYzine (ATARAX) 25 MG tablet  Take 1 tablet (25 mg total) by mouth 3 (three) times daily as needed for itching or anxiety. (Patient not taking: Reported on 12/22/2023)   nicotine (NICODERM CQ - DOSED IN MG/24 HOURS) 21 mg/24hr patch Place 1 patch (21 mg total) onto the skin daily. (Patient not taking: Reported on 12/22/2023)    Long Term Goals: Improvement in symptoms so as ready for discharge  Short Term Goals: Patient will verbalize feelings in meetings with treatment team members., Patient will attend at least of 50% of the groups daily., Pt will complete the PHQ9 on admission, day 3 and  discharge., Patient will participate in completing the [place] Suicide Severity Rating Scale, Patient will score a low risk of violence for 24 hours prior to discharge, and Patient will take medications as prescribed daily.  Medical Decision Making  Status: under IVC  Psychiatric Diagnoses and Treatment:  Alcohol Use Disorder -Modified Ativan taper ( 1 mg BID, 3/20: 1 mg every day) -CIWA with Ativan as needed for CIWA greater than 10  -Last CIWA score is 2 on 3/19 at 6:55 AM -Thiamine 100 mg IM first day and PO after that -Multivitamin with minerals daily -Tylenol 650 mg every 6 hours as needed for pain -Zofran 4 mg every 6 hours as needed for nausea or vomiting -Imodium 2 to 4 mg as needed for diarrhea or loose stools  -Maalox/Mylanta 30 mL every 4 hours as needed for indigestion -Milk of Mag 30 mL as needed for constipation   Insomnia Trazodone 50 mg at bedtime PRN  Medical Issues Being Addressed:  None  Other PRNs: acetaminophen, 650 mg, Q6H PRN alum & mag hydroxide-simeth, 30 mL, Q4H PRN haloperidol, 5 mg, TID PRN  And diphenhydrAMINE, 50 mg, TID PRN haloperidol lactate, 5 mg, TID PRN  And diphenhydrAMINE, 50 mg, TID PRN  And LORazepam, 2 mg, TID PRN haloperidol lactate, 10 mg, TID PRN  And diphenhydrAMINE, 50 mg, TID PRN  And LORazepam, 2 mg, TID PRN hydrOXYzine, 25 mg, Q6H PRN loperamide, 2-4 mg, PRN LORazepam, 1 mg, Q6H PRN magnesium hydroxide, 30 mL, Daily PRN ondansetron, 4 mg, Q6H PRN traZODone, 50 mg, QHS PRN   Other Labs/Imaging Reviewed: CBC unremarkable CMP unremarkable Ethanol on 3/17 is 377 UDS is positive for THC  EKG on 3/18: QTc 430   Disposition: IVC will be upheld  Recommendations  Based on my evaluation the patient does not appear to have an emergency medical condition.  Lorri Frederick, MD 12/23/23  3:17 PM

## 2023-12-24 DIAGNOSIS — F1090 Alcohol use, unspecified, uncomplicated: Secondary | ICD-10-CM | POA: Diagnosis not present

## 2023-12-24 MED ORDER — HYDROXYZINE HCL 25 MG PO TABS
50.0000 mg | ORAL_TABLET | Freq: Four times a day (QID) | ORAL | Status: DC | PRN
  Administered 2023-12-24 (×3): 50 mg via ORAL
  Filled 2023-12-24: qty 2
  Filled 2023-12-24: qty 1
  Filled 2023-12-24 (×2): qty 2

## 2023-12-24 MED ORDER — LORAZEPAM 1 MG PO TABS
1.0000 mg | ORAL_TABLET | Freq: Once | ORAL | Status: AC
  Administered 2023-12-24: 1 mg via ORAL
  Filled 2023-12-24: qty 1

## 2023-12-24 NOTE — ED Notes (Signed)
 Patient resting with eyes closed in no apparent acute distress. Respirations even and unlabored. Environment secured. Safety checks in place according to facility policy.

## 2023-12-24 NOTE — ED Notes (Signed)
 Patient is sleeping. Respirations equal and unlabored, skin warm and dry. No change in assessment or acuity. Routine safety checks conducted according to facility protocol. Will continue to monitor for safety.

## 2023-12-24 NOTE — ED Notes (Signed)
 Pt in the dayroom calm and composed watching TV with other patients. Denies SI/HI/AVH.NAD, respirations are even and unlabored.  Will continue to monitor for safety.

## 2023-12-24 NOTE — ED Notes (Signed)
 Patient sitting in dayroom interacting with peers. No acute distress noted. No concerns voiced. Informed patient to notify staff with any needs or assistance. Patient verbalized understanding or agreement. Safety checks in place per facility policy.

## 2023-12-24 NOTE — Group Note (Signed)
 Group Topic: Wellness  Group Date: 12/24/2023 Start Time: 1200 End Time: 1220 Facilitators: Rechelle Niebla, Jacklynn Barnacle, RN  Department: [facility]  Number of Participants: 7  Group Focus: nutrition education Treatment Modality:  Interpersonal Therapy Interventions utilized were exploration and patient education Purpose: increase insight  Name: [name] Date of Birth: 1971-08-18  MR: [id number]    Level of Participation: active Quality of Participation: attentive and cooperative Interactions with others: gave feedback Mood/Affect: appropriate Triggers (if applicable): None identified Cognition: coherent/clear Progress: Gaining insight Response: Patient attended group and was cooperative throughout.  Plan: patient will be encouraged to continue to attend groups/programing.   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) 12/22/2023   Adjustment disorder with mixed anxiety and depressed mood 10/27/2023   Metabolic acidosis 10/22/2023   Alcohol use disorder, severe, in controlled environment (HCC) 03/25/2023   DTs (delirium tremens) (HCC) 03/25/2023   PTSD (post-traumatic stress disorder) 03/24/2023   Alcohol abuse with alcohol-induced mood disorder (HCC) 11/07/2018   MDD (major depressive disorder), recurrent severe, without psychosis (HCC) 02/17/2018   Alcohol use disorder, severe, dependence (HCC) 12/11/2017   Alcohol withdrawal (HCC) 12/10/2017   Seizure due to alcohol withdrawal (HCC) 08/30/2017   Alcohol withdrawal seizure (HCC) 08/30/2017   Tobacco abuse    Delirium tremens (HCC) 03/02/2017   GI bleed 10/06/2013   Marijuana abuse 10/06/2013   Suicidal ideation 10/05/2013   Alcohol abuse 10/26/2011   Depression with suicidal ideation 10/26/2011

## 2023-12-24 NOTE — ED Notes (Signed)
 Patient alert & oriented x4. Denies intent to harm self or others when asked. Denies A/VH. Patient denies any physical complaints when asked. No acute distress noted. Scheduled medications administered with no complications. Support and encouragement provided. Routine safety checks conducted per facility protocol. Encouraged patient to notify staff if any thoughts of harm towards self or others arise. Patient verbalizes understanding and agreement.

## 2023-12-24 NOTE — ED Provider Notes (Signed)
 Behavioral Health Progress Note  Date and Time: 12/24/2023 1:30 PM Name: [name] MRN:  [id number]  Subjective:    Patient evaluated on the unit.  He reports adequate sleep and appetite.  He reports he is straighter that he is hospitalized, but otherwise reports being in good spirits.  He denies any cravings for alcohol.  On interview, suicidal ideations are not present. Thoughts of self harm are not present. Homicidal ideations are not present.   There are no auditory hallucinations, visual hallucinations, paranoid ideations, or delusional thought processes.   Side effects to currently prescribed medications are none. There are no somatic complaints. Reports regular bowel movements.. There are no withdrawals reported today.   Diagnosis:  Final diagnoses:  Alcohol use disorder    Total Time spent with patient: 45 minutes  ubstance Use Hx: Alcohol:  Reports he typically drinks on important dates--inconsistent with chart review of multiple ED visits and psychiatric hospitalizations related to alcohol use disorder.             Hx of seizures/Dts: reports it has been about 4-5 years ago since his last. Denies hx of DTs Tobacco: 1.5 ppd, for 29 years Cannabis:smoke 1 joint nightly to help him sleep Cocaine: denies Methamphetamines: denies Psilocybin (mushrooms): denies Ecstasy (MDMA / molly): denies LSD (acid): never tried: denies Opiates (fentanyl / heroin): denies Benzos (Xanax, Klonopin): deniesdenies IVDU: denies Rehab hx:  Reports he has been to [place] for detox in the past, [place], [place]. Most recently was at [facility]   Past Psychiatric Hx: Current Psychiatrist: None [facility] Therapist[facility] Previous Psychiatric Diagnoses: PTSD, alcohol use disorder Psychiatric Medications: Current None Past Paroxetine and venlafaxine but reports these made things worse Psychiatric Hospitalization hx: Numerous hospitalizations  for substance use disorder but no other reasons. Psychotherapy hx: Therapy at [facility] but does not describe what program Neuromodulation history: Denies History of suicide: Denies History of homicide or aggression: Denies   Past Medical History: PCP: Restaurant manager, fast food Medical Dx: None Medications: None Allergies: Paroxetine, venlafaxine, NicoDerm Hospitalizations: Numerous for alcohol-related reasons Surgeries: Numerous hernia surgeries Trauma: Denies Seizures: Denies   Family Medical History: Unsure   Family Psychiatric History: Psychiatric Dx: PTSD in father Suicide Hx: Denies Violence/Aggression: Denies Substance use: Yes in first-degree relatives   Social History: Currently live in [place] by himself Social Support: son Education: some college Occupational hx: retired, on SSI Marital Status: divorced Children: has a 43 yo son Legal: denies upcoming court dates,  Military:served in the [employer] for 3 years. [facility] with [facility].  Current Medications:  Current Facility-Administered Medications  Medication Dose Route Frequency Provider Last Rate Last Admin   acetaminophen (TYLENOL) tablet 650 mg  650 mg Oral Q6H PRN Eligha Bridegroom, NP       alum & mag hydroxide-simeth (MAALOX/MYLANTA) 200-200-20 MG/5ML suspension 30 mL  30 mL Oral Q4H PRN Eligha Bridegroom, NP       haloperidol (HALDOL) tablet 5 mg  5 mg Oral TID PRN Eligha Bridegroom, NP   5 mg at 12/23/23 1303   And   diphenhydrAMINE (BENADRYL) capsule 50 mg  50 mg Oral TID PRN Eligha Bridegroom, NP   50 mg at 12/23/23 1303   haloperidol lactate (HALDOL) injection 5 mg  5 mg Intramuscular TID PRN Eligha Bridegroom, NP       And   diphenhydrAMINE (BENADRYL) injection 50 mg  50 mg Intramuscular TID PRN Eligha Bridegroom, NP       And  LORazepam (ATIVAN) injection 2 mg  2 mg Intramuscular TID PRN Eligha Bridegroom, NP       haloperidol lactate (HALDOL) injection 10 mg  10 mg Intramuscular TID PRN Eligha Bridegroom, NP       And    diphenhydrAMINE (BENADRYL) injection 50 mg  50 mg Intramuscular TID PRN Eligha Bridegroom, NP       And   LORazepam (ATIVAN) injection 2 mg  2 mg Intramuscular TID PRN Eligha Bridegroom, NP       hydrOXYzine (ATARAX) tablet 50 mg  50 mg Oral Q6H PRN Carrion-Carrero, Karle Starch, MD   50 mg at 12/24/23 0935   loperamide (IMODIUM) capsule 2-4 mg  2-4 mg Oral PRN Eligha Bridegroom, NP       LORazepam (ATIVAN) tablet 1 mg  1 mg Oral Q6H PRN Eligha Bridegroom, NP       magnesium hydroxide (MILK OF MAGNESIA) suspension 30 mL  30 mL Oral Daily PRN Eligha Bridegroom, NP       multivitamin with minerals tablet 1 tablet  1 tablet Oral Daily Eligha Bridegroom, NP   1 tablet at 12/24/23 0933   ondansetron (ZOFRAN-ODT) disintegrating tablet 4 mg  4 mg Oral Q6H PRN Eligha Bridegroom, NP       thiamine (VITAMIN B1) tablet 100 mg  100 mg Oral Daily Eligha Bridegroom, NP   100 mg at 12/24/23 1610   traZODone (DESYREL) tablet 50 mg  50 mg Oral QHS PRN Lorri Frederick, MD   50 mg at 12/23/23 2101   Current Outpatient Medications  Medication Sig Dispense Refill   gabapentin (NEURONTIN) 300 MG capsule Take 1 capsule (300 mg total) by mouth 3 (three) times daily. (Patient not taking: Reported on 12/22/2023) 90 capsule 0   hydrOXYzine (ATARAX) 25 MG tablet Take 1 tablet (25 mg total) by mouth 3 (three) times daily as needed for itching or anxiety. (Patient not taking: Reported on 12/22/2023) 60 tablet 0   nicotine (NICODERM CQ - DOSED IN MG/24 HOURS) 21 mg/24hr patch Place 1 patch (21 mg total) onto the skin daily. (Patient not taking: Reported on 12/22/2023) 28 patch 0   traZODone (DESYREL) 50 MG tablet Take 1 tablet (50 mg total) by mouth at bedtime as needed for sleep (for difficulty initiating sleep in patients with depressive symptoms). (Patient not taking: Reported on 12/22/2023) 30 tablet 0    Labs  Lab Results:  Admission on 12/21/2023, Discharged on 12/22/2023  Component Date Value Ref Range Status   WBC  12/21/2023 10.4  4.0 - 10.5 K/uL Final   RBC 12/21/2023 6.27 (H)  4.22 - 5.81 MIL/uL Final   Hemoglobin 12/21/2023 16.8  13.0 - 17.0 g/dL Final   HCT 96/01/5408 49.9  39.0 - 52.0 % Final   MCV 12/21/2023 79.6 (L)  80.0 - 100.0 fL Final   MCH 12/21/2023 26.8  26.0 - 34.0 pg Final   MCHC 12/21/2023 33.7  30.0 - 36.0 g/dL Final   RDW 81/19/1478 14.9  11.5 - 15.5 % Final   Platelets 12/21/2023 276  150 - 400 K/uL Final   nRBC 12/21/2023 0.0  0.0 - 0.2 % Final   Neutrophils Relative % 12/21/2023 41  % Final   Neutro Abs 12/21/2023 4.3  1.7 - 7.7 K/uL Final   Lymphocytes Relative 12/21/2023 51  % Final   Lymphs Abs 12/21/2023 5.3 (H)  0.7 - 4.0 K/uL Final   Monocytes Relative 12/21/2023 6  % Final   Monocytes Absolute 12/21/2023 0.6  0.1 -  1.0 K/uL Final   Eosinophils Relative 12/21/2023 1  % Final   Eosinophils Absolute 12/21/2023 0.1  0.0 - 0.5 K/uL Final   Basophils Relative 12/21/2023 1  % Final   Basophils Absolute 12/21/2023 0.1  0.0 - 0.1 K/uL Final   Immature Granulocytes 12/21/2023 0  % Final   Abs Immature Granulocytes 12/21/2023 0.03  0.00 - 0.07 K/uL Final   Performed at [facility], [address] [place], [place] [address]   Sodium 12/21/2023 138  135 - 145 mmol/L Final   Potassium 12/21/2023 3.5  3.5 - 5.1 mmol/L Final   Chloride 12/21/2023 103  98 - 111 mmol/L Final   CO2 12/21/2023 17 (L)  22 - 32 mmol/L Final   Glucose, Bld 12/21/2023 74  70 - 99 mg/dL Final   Glucose reference range applies only to samples taken after fasting for at least 8 hours.   BUN 12/21/2023 8  6 - 20 mg/dL Final   Creatinine, Ser 12/21/2023 0.76  0.61 - 1.24 mg/dL Final   Calcium 30/86/5784 8.7 (L)  8.9 - 10.3 mg/dL Final   Total Protein 69/62/9528 8.1  6.5 - 8.1 g/dL Final   Albumin 41/32/4401 4.5  3.5 - 5.0 g/dL Final   AST 02/72/5366 34  15 - 41 U/L Final   ALT 12/21/2023 26  0 - 44 U/L Final   Alkaline Phosphatase 12/21/2023 62  38 - 126 U/L Final   Total Bilirubin 12/21/2023 0.7   0.0 - 1.2 mg/dL Final   GFR, Estimated 12/21/2023 >60  >60 mL/min Final   Comment: (NOTE) Calculated using the CKD-EPI Creatinine Equation (2021)    Anion gap 12/21/2023 18 (H)  5 - 15 Final   Performed at [facility], [address], [place], [place] [address]   Opiates 12/21/2023 NONE DETECTED  NONE DETECTED Final   Cocaine 12/21/2023 NONE DETECTED  NONE DETECTED Final   Benzodiazepines 12/21/2023 NONE DETECTED  NONE DETECTED Final   Amphetamines 12/21/2023 NONE DETECTED  NONE DETECTED Final   Tetrahydrocannabinol 12/21/2023 POSITIVE (A)  NONE DETECTED Final   Barbiturates 12/21/2023 NONE DETECTED  NONE DETECTED Final   Comment: (NOTE) DRUG SCREEN FOR MEDICAL PURPOSES ONLY.  IF CONFIRMATION IS NEEDED FOR ANY PURPOSE, NOTIFY LAB WITHIN 5 DAYS.  LOWEST DETECTABLE LIMITS FOR URINE DRUG SCREEN Drug Class                     Cutoff (ng/mL) Amphetamine and metabolites    1000 Barbiturate and metabolites    200 Benzodiazepine                 200 Opiates and metabolites        300 Cocaine and metabolites        300 THC                            50 Performed at [facility], [address] [place], [place] [address]    Alcohol, Ethyl (B) 12/21/2023 377 (HH)  <10 mg/dL Final   Comment: CRITICAL RESULT CALLED TO, READ BACK BY AND VERIFIED WITH ESTHER RAYMONDS RN @1736  ON 12/21/23 BY MAB (NOTE) Lowest detectable limit for serum alcohol is 10 mg/dL.  For medical purposes only. Performed at [facility], [address] [place], [place] [address]    Color, Urine 12/21/2023 STRAW (A)  YELLOW Final   APPearance 12/21/2023 CLEAR  CLEAR Final   Specific Gravity, Urine 12/21/2023 1.003 (L)  1.005 - 1.030 Final   pH 12/21/2023 5.0  5.0 - 8.0 Final   Glucose, UA 12/21/2023 NEGATIVE  NEGATIVE mg/dL Final   Hgb urine dipstick 12/21/2023 SMALL (A)  NEGATIVE Final   Bilirubin Urine 12/21/2023 NEGATIVE  NEGATIVE Final   Ketones, ur 12/21/2023 NEGATIVE  NEGATIVE mg/dL Final    Protein, ur 91/47/8295 NEGATIVE  NEGATIVE mg/dL Final   Nitrite 62/13/0865 NEGATIVE  NEGATIVE Final   Leukocytes,Ua 12/21/2023 NEGATIVE  NEGATIVE Final   RBC / HPF 12/21/2023 0-5  0 - 5 RBC/hpf Final   WBC, UA 12/21/2023 0-5  0 - 5 WBC/hpf Final   Bacteria, UA 12/21/2023 NONE SEEN  NONE SEEN Final   Squamous Epithelial / HPF 12/21/2023 0-5  0 - 5 /HPF Final   Performed at [facility], [address] [place], [place] [address]  Admission on 10/22/2023, Discharged on 10/26/2023  Component Date Value Ref Range Status   Sodium 10/22/2023 142  135 - 145 mmol/L Final   Potassium 10/22/2023 3.8  3.5 - 5.1 mmol/L Final   Chloride 10/22/2023 108  98 - 111 mmol/L Final   CO2 10/22/2023 19 (L)  22 - 32 mmol/L Final   Glucose, Bld 10/22/2023 89  70 - 99 mg/dL Final   Glucose reference range applies only to samples taken after fasting for at least 8 hours.   BUN 10/22/2023 8  6 - 20 mg/dL Final   Creatinine, Ser 10/22/2023 0.64  0.61 - 1.24 mg/dL Final   Calcium 62/95/2841 9.1  8.9 - 10.3 mg/dL Final   Total Protein 32/44/0102 8.2 (H)  6.5 - 8.1 g/dL Final   Albumin 72/53/6644 4.7  3.5 - 5.0 g/dL Final   AST 03/47/4259 35  15 - 41 U/L Final   ALT 10/22/2023 26  0 - 44 U/L Final   Alkaline Phosphatase 10/22/2023 56  38 - 126 U/L Final   Total Bilirubin 10/22/2023 0.7  0.0 - 1.2 mg/dL Final   GFR, Estimated 10/22/2023 >60  >60 mL/min Final   Comment: (NOTE) Calculated using the CKD-EPI Creatinine Equation (2021)    Anion gap 10/22/2023 15  5 - 15 Final   Performed at [facility], [address] [place], [place] [address]   Alcohol, Ethyl (B) 10/22/2023 275 (H)  <10 mg/dL Final   Comment: (NOTE) Lowest detectable limit for serum alcohol is 10 mg/dL.  For medical purposes only. Performed at [facility], [address], [place], [place] [address]    Opiates 10/22/2023 NONE DETECTED  NONE DETECTED Final   Cocaine 10/22/2023 NONE DETECTED  NONE  DETECTED Final   Benzodiazepines 10/22/2023 NONE DETECTED  NONE DETECTED Final   Amphetamines 10/22/2023 NONE DETECTED  NONE DETECTED Final   Tetrahydrocannabinol 10/22/2023 POSITIVE (A)  NONE DETECTED Final   Barbiturates 10/22/2023 NONE DETECTED  NONE DETECTED Final   Comment: (NOTE) DRUG SCREEN FOR MEDICAL PURPOSES ONLY.  IF CONFIRMATION IS NEEDED FOR ANY PURPOSE, NOTIFY LAB WITHIN 5 DAYS.  LOWEST DETECTABLE LIMITS FOR URINE DRUG SCREEN Drug Class                     Cutoff (ng/mL) Amphetamine and metabolites    1000 Barbiturate and metabolites    200 Benzodiazepine                 200 Opiates and metabolites        300  Cocaine and metabolites        300 THC                            50 Performed at [facility], [address], [place], [place] [address]    WBC 10/22/2023 7.8  4.0 - 10.5 K/uL Final   RBC 10/22/2023 6.50 (H)  4.22 - 5.81 MIL/uL Final   Hemoglobin 10/22/2023 17.0  13.0 - 17.0 g/dL Final   HCT 60/45/4098 51.8  39.0 - 52.0 % Final   MCV 10/22/2023 79.7 (L)  80.0 - 100.0 fL Final   MCH 10/22/2023 26.2  26.0 - 34.0 pg Final   MCHC 10/22/2023 32.8  30.0 - 36.0 g/dL Final   RDW 11/91/4782 15.0  11.5 - 15.5 % Final   Platelets 10/22/2023 276  150 - 400 K/uL Final   nRBC 10/22/2023 0.0  0.0 - 0.2 % Final   Neutrophils Relative % 10/22/2023 54  % Final   Neutro Abs 10/22/2023 4.2  1.7 - 7.7 K/uL Final   Lymphocytes Relative 10/22/2023 39  % Final   Lymphs Abs 10/22/2023 3.1  0.7 - 4.0 K/uL Final   Monocytes Relative 10/22/2023 5  % Final   Monocytes Absolute 10/22/2023 0.4  0.1 - 1.0 K/uL Final   Eosinophils Relative 10/22/2023 1  % Final   Eosinophils Absolute 10/22/2023 0.1  0.0 - 0.5 K/uL Final   Basophils Relative 10/22/2023 1  % Final   Basophils Absolute 10/22/2023 0.0  0.0 - 0.1 K/uL Final   Immature Granulocytes 10/22/2023 0  % Final   Abs Immature Granulocytes 10/22/2023 0.02  0.00 - 0.07 K/uL Final   Performed at [facility], [address] [place], [place] [address]   Acetaminophen (Tylenol), Serum 10/22/2023 <10 (L)  10 - 30 ug/mL Final   Comment: (NOTE) Therapeutic concentrations vary significantly. A range of 10-30 ug/mL  may be an effective concentration for many patients. However, some  are best treated at concentrations outside of this range. Acetaminophen concentrations >150 ug/mL at 4 hours after ingestion  and >50 ug/mL at 12 hours after ingestion are often associated with  toxic reactions.  Performed at [facility], [address] [place], [place] [address]    Salicylate Lvl 10/22/2023 <7.0 (L)  7.0 - 30.0 mg/dL Final   Performed at [facility], [address] [place], [place] [address]   Sodium 10/23/2023 139  135 - 145 mmol/L Final   Potassium 10/23/2023 3.7  3.5 - 5.1 mmol/L Final   Chloride 10/23/2023 107  98 - 111 mmol/L Final   CO2 10/23/2023 23  22 - 32 mmol/L Final   Glucose, Bld 10/23/2023 79  70 - 99 mg/dL Final   Glucose reference range applies only to samples taken after fasting for at least 8 hours.   BUN 10/23/2023 16  6 - 20 mg/dL Final   Creatinine, Ser 10/23/2023 0.72  0.61 - 1.24 mg/dL Final   Calcium 62/95/2841 8.9  8.9 - 10.3 mg/dL Final   Total Protein 32/44/0102 6.4 (L)  6.5 - 8.1 g/dL Final   Albumin 72/53/6644 3.7  3.5 - 5.0 g/dL Final   AST 03/47/4259 35  15 - 41 U/L Final   ALT 10/23/2023 24  0 - 44 U/L Final   Alkaline Phosphatase 10/23/2023 48  38 - 126 U/L Final   Total Bilirubin 10/23/2023 1.6 (H)  0.0 -  1.2 mg/dL Final   GFR, Estimated 10/23/2023 >60  >60 mL/min Final   Comment: (NOTE) Calculated using the CKD-EPI Creatinine Equation (2021)    Anion gap 10/23/2023 9  5 - 15 Final   Performed at [facility], [address] [place], [place] [address]   WBC 10/23/2023 6.6  4.0 - 10.5 K/uL Final   RBC 10/23/2023 5.43  4.22 - 5.81 MIL/uL Final   Hemoglobin 10/23/2023 14.5  13.0 -  17.0 g/dL Final   HCT 63/87/5643 43.6  39.0 - 52.0 % Final   MCV 10/23/2023 80.3  80.0 - 100.0 fL Final   MCH 10/23/2023 26.7  26.0 - 34.0 pg Final   MCHC 10/23/2023 33.3  30.0 - 36.0 g/dL Final   RDW 32/95/1884 14.3  11.5 - 15.5 % Final   Platelets 10/23/2023 218  150 - 400 K/uL Final   nRBC 10/23/2023 0.0  0.0 - 0.2 % Final   Performed at [facility], [address] [place], [place] [address]   TSH 10/23/2023 0.783  0.350 - 4.500 uIU/mL Final   Comment: Performed by a 3rd Generation assay with a functional sensitivity of <=0.01 uIU/mL. Performed at [facility], [address] [place], [place] [address]    Vitamin B-12 10/23/2023 199  180 - 914 pg/mL Final   Comment: (NOTE) This assay is not validated for testing neonatal or myeloproliferative syndrome specimens for Vitamin B12 levels. Performed at [facility], [address], [place], [place] [address]    Alcohol, Ethyl (B) 10/23/2023 12 (H)  <10 mg/dL Final   Comment: (NOTE) Lowest detectable limit for serum alcohol is 10 mg/dL.  For medical purposes only. Performed at [facility], [address] [place], [place] [address]    WBC 10/24/2023 5.4  4.0 - 10.5 K/uL Final   RBC 10/24/2023 5.59  4.22 - 5.81 MIL/uL Final   Hemoglobin 10/24/2023 15.1  13.0 - 17.0 g/dL Final   HCT 22/11/5425 44.7  39.0 - 52.0 % Final   MCV 10/24/2023 80.0  80.0 - 100.0 fL Final   MCH 10/24/2023 27.0  26.0 - 34.0 pg Final   MCHC 10/24/2023 33.8  30.0 - 36.0 g/dL Final   RDW 03/29/7627 14.1  11.5 - 15.5 % Final   Platelets 10/24/2023 211  150 - 400 K/uL Final   nRBC 10/24/2023 0.0  0.0 - 0.2 % Final   Performed at [facility], [address] [place], [place] [address]   Sodium 10/24/2023 139  135 - 145 mmol/L Final   Potassium 10/24/2023 3.6  3.5 - 5.1 mmol/L Final   Chloride 10/24/2023 106  98 - 111 mmol/L Final   CO2 10/24/2023 28  22 - 32  mmol/L Final   Glucose, Bld 10/24/2023 93  70 - 99 mg/dL Final   Glucose reference range applies only to samples taken after fasting for at least 8 hours.   BUN 10/24/2023 9  6 - 20 mg/dL Final   Creatinine, Ser 10/24/2023 0.68  0.61 - 1.24 mg/dL Final   Calcium 61/60/7371 9.3  8.9 - 10.3 mg/dL Final   Total Protein 03/31/9484 7.0  6.5 - 8.1 g/dL Final   Albumin 46/27/0350 4.2  3.5 - 5.0 g/dL Final   AST 09/38/1829 56 (H)  15 - 41 U/L Final   ALT 10/24/2023 41  0 - 44 U/L Final   Alkaline Phosphatase 10/24/2023 51  38 - 126 U/L Final  Total Bilirubin 10/24/2023 1.3 (H)  0.0 - 1.2 mg/dL Final   GFR, Estimated 10/24/2023 >60  >60 mL/min Final   Comment: (NOTE) Calculated using the CKD-EPI Creatinine Equation (2021)    Anion gap 10/24/2023 5  5 - 15 Final   Performed at [facility], [address], [place], [place] [address]   Magnesium 10/24/2023 2.2  1.7 - 2.4 mg/dL Final   Performed at [facility], [address] [place], [place] [address]   Folate 10/24/2023 12.3  >5.9 ng/mL Final   Performed at [facility], [address] [place], [place] [address]   WBC 10/25/2023 5.1  4.0 - 10.5 K/uL Final   RBC 10/25/2023 5.67  4.22 - 5.81 MIL/uL Final   Hemoglobin 10/25/2023 15.2  13.0 - 17.0 g/dL Final   HCT 91/47/8295 45.9  39.0 - 52.0 % Final   MCV 10/25/2023 81.0  80.0 - 100.0 fL Final   MCH 10/25/2023 26.8  26.0 - 34.0 pg Final   MCHC 10/25/2023 33.1  30.0 - 36.0 g/dL Final   RDW 62/13/0865 14.1  11.5 - 15.5 % Final   Platelets 10/25/2023 206  150 - 400 K/uL Final   nRBC 10/25/2023 0.0  0.0 - 0.2 % Final   Performed at [facility], [address] [place], [place] [address]   Sodium 10/25/2023 134 (L)  135 - 145 mmol/L Final   Potassium 10/25/2023 3.6  3.5 - 5.1 mmol/L Final   Chloride 10/25/2023 105  98 - 111 mmol/L Final   CO2 10/25/2023 24  22 - 32 mmol/L Final   Glucose, Bld 10/25/2023 93  70 - 99  mg/dL Final   Glucose reference range applies only to samples taken after fasting for at least 8 hours.   BUN 10/25/2023 10  6 - 20 mg/dL Final   Creatinine, Ser 10/25/2023 0.68  0.61 - 1.24 mg/dL Final   Calcium 62/95/2841 8.9  8.9 - 10.3 mg/dL Final   Total Protein 32/44/0102 6.7  6.5 - 8.1 g/dL Final   Albumin 72/53/6644 3.7  3.5 - 5.0 g/dL Final   AST 03/47/4259 37  15 - 41 U/L Final   ALT 10/25/2023 34  0 - 44 U/L Final   Alkaline Phosphatase 10/25/2023 46  38 - 126 U/L Final   Total Bilirubin 10/25/2023 1.1  0.0 - 1.2 mg/dL Final   GFR, Estimated 10/25/2023 >60  >60 mL/min Final   Comment: (NOTE) Calculated using the CKD-EPI Creatinine Equation (2021)    Anion gap 10/25/2023 5  5 - 15 Final   Performed at [facility], [address], [place], [place] [address]   Magnesium 10/25/2023 2.1  1.7 - 2.4 mg/dL Final   Performed at [facility], [address], [place], [place] [address]   WBC 10/26/2023 5.3  4.0 - 10.5 K/uL Final   RBC 10/26/2023 5.72  4.22 - 5.81 MIL/uL Final   Hemoglobin 10/26/2023 15.3  13.0 - 17.0 g/dL Final   HCT 29/51/8841 46.6  39.0 - 52.0 % Final   MCV 10/26/2023 81.5  80.0 - 100.0 fL Final   MCH 10/26/2023 26.7  26.0 - 34.0 pg Final   MCHC 10/26/2023 32.8  30.0 - 36.0 g/dL Final   RDW 66/03/3015 14.0  11.5 - 15.5 % Final   Platelets 10/26/2023 199  150 - 400 K/uL Final   nRBC 10/26/2023 0.0  0.0 - 0.2 % Final   Performed at  [facility], [address], [place], [place] [address]   Sodium 10/26/2023 140  135 - 145 mmol/L Final   Potassium 10/26/2023 3.6  3.5 - 5.1 mmol/L Final   Chloride 10/26/2023 105  98 - 111 mmol/L Final   CO2 10/26/2023 24  22 - 32 mmol/L Final   Glucose, Bld 10/26/2023 87  70 - 99 mg/dL Final   Glucose reference range applies only to samples taken after fasting for at least 8 hours.   BUN 10/26/2023 13  6 - 20 mg/dL Final   Creatinine, Ser 10/26/2023 0.56 (L)  0.61 - 1.24  mg/dL Final   Calcium 60/45/4098 8.9  8.9 - 10.3 mg/dL Final   Total Protein 11/91/4782 6.8  6.5 - 8.1 g/dL Final   Albumin 95/62/1308 3.8  3.5 - 5.0 g/dL Final   AST 65/78/4696 26  15 - 41 U/L Final   ALT 10/26/2023 28  0 - 44 U/L Final   Alkaline Phosphatase 10/26/2023 44  38 - 126 U/L Final   Total Bilirubin 10/26/2023 0.7  0.0 - 1.2 mg/dL Final   GFR, Estimated 10/26/2023 >60  >60 mL/min Final   Comment: (NOTE) Calculated using the CKD-EPI Creatinine Equation (2021)    Anion gap 10/26/2023 11  5 - 15 Final   Performed at [facility], [address] [place], [place] [address]   Magnesium 10/26/2023 2.2  1.7 - 2.4 mg/dL Final   Performed at [facility], [address], [place], [place] [address]    Blood Alcohol level:  Lab Results  Component Value Date   ETH 377 [facility]) 12/21/2023   ETH 12 (H) 10/23/2023    Metabolic Disorder Labs: No results found for: "HGBA1C", "MPG" No results found for: "PROLACTIN" Lab Results  Component Value Date   CHOL 225 (H) 10/14/2013   TRIG 112 10/14/2013   HDL 62 10/14/2013   CHOLHDL 3.6 10/14/2013   VLDL 22 10/14/2013   LDLCALC 141 (H) 10/14/2013   LDLCALC (H) 12/24/2010    156        Total Cholesterol/HDL:CHD Risk Coronary Heart Disease Risk Table                     Men   Women  1/2 Average Risk   3.4   3.3  Average Risk       5.0   4.4  2 X Average Risk   9.6   7.1  3 X Average Risk  23.4   11.0        Use the calculated Patient Ratio above and the CHD Risk Table to determine the patient's CHD Risk.        ATP III CLASSIFICATION (LDL):  <100     mg/dL   Optimal  401-027  mg/dL   Near or Above                    Optimal  130-159  mg/dL   Borderline  253-664  mg/dL   High  >403     mg/dL   Very High    Therapeutic Lab Levels: Lab Results  Component Value Date   LITHIUM <0.25 (L) 06/08/2011   No results found for: "VALPROATE" No results found for: "CBMZ"  Physical Findings    AIMS    Flowsheet Row [facility]  AIMS Total Score 0      AUDIT  Flowsheet Row [facility] Ad[facility] ([facility]) [facility]  Alcohol Use Disorder Identification Test Final Score (AUDIT) 23 32      PHQ2-9    Flowsheet Row ED from 12/22/2023 in [facility]  PHQ-2 Total Score 0      Flowsheet Row ED from 12/22/2023 in [facility] ED from 12/21/2023 in [facility] at [facility] Admission (Dis[facility]) [facility]  C-SSRS RISK CATEGORY High Risk High Risk High Risk        Musculoskeletal  Strength & Muscle Tone: within normal limits Gait & Station: normal Patient leans: N/A  Psychiatric Specialty Exam  Presentation  General Appearance:  Appropriate for Environment  Eye Contact: Good  Speech: Clear and Coherent; Normal Rate  Speech Volume: Normal  Handedness: -- (not assessed)   Mood and Affect  Mood: Irritable; Anxious  Affect: -- (irritable)   Thought Process  Thought Processes: Linear  Descriptions of Associations:Intact  Orientation:None  Thought Content:Logical     Hallucinations:Hallucinations: None  Ideas of Reference:None  Suicidal Thoughts:Suicidal Thoughts: No  Homicidal Thoughts:Homicidal Thoughts: No   Sensorium  Memory: Immediate Good; Recent Good; Remote Good  Judgment: Poor  Insight: Poor   Executive Functions  Concentration: Fair  Attention Span: Fair  Recall: Fair  Fund of Knowledge: Fair  Language: Fair   Psychomotor Activity  Psychomotor Activity: Psychomotor Activity: Normal   Assets  Assets: Desire for Improvement; Resilience; Communication  Skills   Sleep  Sleep: Sleep: Fair   Nutritional Assessment (For OBS and [facility] admissions only) Has the patient had a weight loss or gain of 10 pounds or more in the last 3 months?: No Has the patient had a decrease in food intake/or appetite?: No Does the patient have dental problems?: No Does the patient have eating habits or behaviors that may be indicators of an eating disorder including binging or inducing vomiting?: No Has the patient recently lost weight without trying?: 0 Has the patient been eating poorly because of a decreased appetite?: 0 Malnutrition Screening Tool Score: 0    Physical Exam  Physical Exam Vitals and nursing note reviewed.  Constitutional:      General: He is not in acute distress.    Appearance: He is not ill-appearing.  HENT:     Head: Normocephalic and atraumatic.  Eyes:     Extraocular Movements: Extraocular movements intact.     Conjunctiva/sclera: Conjunctivae normal.  Pulmonary:     Effort: No respiratory distress.  Musculoskeletal:        General: Normal range of motion.  Skin:    General: Skin is warm and dry.  Neurological:     General: No focal deficit present.    Review of Systems  All other systems reviewed and are negative.  Blood pressure 122/60, pulse 96, temperature 98.5 F (36.9 C), temperature source Oral, resp. rate 18, SpO2 98%. There is no height or weight on file to calculate BMI.  Treatment Plan Summary: Daily contact with patient to assess and evaluate symptoms and progress in treatment and Medication management  Status: under IVC   Psychiatric Diagnoses and Treatment:  Alcohol Use Disorder -Modified Ativan taper -CIWA with Ativan as needed for CIWA greater than 10             -Last CIWA score is 2 on 3/19 at 6:55  AM -Thiamine 100 mg IM first day and PO after that -Multivitamin with minerals daily -Tylenol 650 mg every 6 hours as needed for pain -Zofran 4 mg every 6 hours as needed for nausea or  vomiting -Imodium 2 to 4 mg as needed for diarrhea or loose stools  -Maalox/Mylanta 30 mL every 4 hours as needed for indigestion -Milk of Mag 30 mL as needed for constipation    Insomnia Trazodone 50 mg at bedtime PRN   Medical Issues Being Addressed:  None   Other PRNs: acetaminophen, 650 mg, Q6H PRN alum & mag hydroxide-simeth, 30 mL, Q4H PRN haloperidol, 5 mg, TID PRN  And diphenhydrAMINE, 50 mg, TID PRN haloperidol lactate, 5 mg, TID PRN  And diphenhydrAMINE, 50 mg, TID PRN  And LORazepam, 2 mg, TID PRN haloperidol lactate, 10 mg, TID PRN  And diphenhydrAMINE, 50 mg, TID PRN  And LORazepam, 2 mg, TID PRN hydrOXYzine, 25 mg, Q6H PRN loperamide, 2-4 mg, PRN LORazepam, 1 mg, Q6H PRN magnesium hydroxide, 30 mL, Daily PRN ondansetron, 4 mg, Q6H PRN traZODone, 50 mg, QHS PRN     Other Labs/Imaging Reviewed: CBC unremarkable CMP unremarkable Ethanol on 3/17 is 377 UDS is positive for THC   EKG on 3/18: QTc 430     Disposition: discharge home on 12/25/2023  Lorri Frederick, MD 12/24/2023 1:30 PM

## 2023-12-24 NOTE — Discharge Planning (Signed)
 LCSW spoke with patient to discuss disposition plans.  Patient reports his plan is to return back home with continue services at [facility].  Patient reports he also has support from AA.  Patient reports no concerns of returning home and states once he has access to his personal items he will call an uber for himself on tomorrow.  Patient reports he was told by the MD that he would be discharged by 11:00am on tomorrow.  LCSW explored if patient would like for LCSW to schedule outpatient appointments for the patient at [facility].  Patient reports he does not need additional support for appts.  LCSW expressed understanding.  No other needs were reported by the patient at this time.  LCSW will continue to follow and provide support to patient while on [facility] unit.  Fernande Boyden, LCSW Clinical Social Worker [facility] Ph: [phone]

## 2023-12-24 NOTE — Group Note (Unsigned)
 Group Topic: Balance in Life  Group Date: 12/24/2023 Start Time: 1200 End Time: 1220 Facilitators: Vonzell Schlatter B  Department: [facility]  Number of Participants: 8  Group Focus: coping skills and daily focus Treatment Modality:  Psychoeducation Interventions utilized were problem solving and support Purpose: regain self-worth and reinforce self-care   Name: [name] Date of Birth: [birth year]  MR: [id number]    Level of Participation: {THERAPIES; PSYCH GROUP PARTICIPATION WUJWJ:19147} Quality of Participation: {THERAPIES; PSYCH QUALITY OF PARTICIPATION:23992} Interactions with others: {THERAPIES; PSYCH INTERACTIONS:23993} Mood/Affect: {THERAPIES; PSYCH MOOD/AFFECT:23994} Triggers (if applicable): *** Cognition: {THERAPIES; PSYCH COGNITION:23995} Progress: {THERAPIES; PSYCH PROGRESS:23997} Response: *** Plan: {THERAPIES; PSYCH WGNF:62130}  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) 12/22/2023   Adjustment disorder with mixed anxiety and depressed mood 10/27/2023   Metabolic acidosis 10/22/2023   Alcohol use disorder, severe, in controlled environment (HCC) 03/25/2023   DTs (delirium tremens) (HCC) 03/25/2023   PTSD (post-traumatic stress disorder) 03/24/2023   Alcohol abuse with alcohol-induced mood disorder (HCC) 11/07/2018   MDD (major depressive disorder), recurrent severe, without psychosis (HCC) 02/17/2018   Alcohol use disorder, severe, dependence (HCC) 12/11/2017   Alcohol withdrawal (HCC) 12/10/2017   Seizure due to alcohol withdrawal (HCC) 08/30/2017   Alcohol withdrawal seizure (HCC) 08/30/2017   Tobacco abuse    Delirium tremens (HCC) 03/02/2017   GI bleed 10/06/2013   Marijuana abuse 10/06/2013   Suicidal ideation 10/05/2013   Alcohol abuse 10/26/2011   Depression with suicidal ideation 10/26/2011

## 2023-12-24 NOTE — Discharge Instructions (Signed)
 Patient will resume care with [facility] Address: [address] [place], [place] [address] Phone: [phone]   [facility] [address], [place], [address] [phone] phone  New Patient Assessment/Therapy Walk-Ins:  Monday and Wednesday: 8 am until slots are full. Every 1st and 2nd Fridays of the month: 1 pm - 5 pm.  NO ASSESSMENT/THERAPY WALK-INS ON TUESDAYS OR THURSDAYS  New Patient Assessment/Medication Management Walk-Ins:  Monday - Friday:  8 am - 11 am.  For all walk-ins, we ask that you arrive by 7:30 am because patients will be seen in the order of arrival.  Availability is limited; therefore, you may not be seen on the same day that you walk-in.  Our goal is to serve and meet the needs of our community to the best of our [place] ability.  SUBSTANCE USE TREATMENT for Medicaid and State Funded/IPRS  [facility] [address], [place], [address] [phone] phone NOTE: [facility] is no longer offering IOP services.  Serves those who are low-income or have no insurance.  [facility] [address], [place], [place], [address] [phone] phone [phone] fax NOTE: Does have Substance Abuse-Intensive Outpatient Program [facility]) as well as transitional housing if eligible.  [facility] [address] [place], [place], [address] [phone] phone [phone] fax  [facility] [phone] [place], [place], [address] [phone] phone [phone] fax  HALFWAY HOUSES:  [facility] [phone]  [employer].oxfordvacancies.com  12 STEP PROGRAMS:  Alcoholics Anonymous of [employer]  Narcotics Anonymous of [place] [employer]  Al-Anon of [place], [place] www.[place]alanon.org/find-meetings.html  Nar-Anon https://nar-anon.org/find-a-meetin  List of Residential  placements:   [facility] in [place]: [phone]  [facility]: [phone]  [place], [place] [phone]: Male and male facility; 30-day program: (uninsured and Medicaid such as Laurena Bering, [place], [place], partners)  [facility]: [phone]; men and women's facility; 28 days; Can have Medicaid tailored plan Tour manager or Partners)  [facility]: [phone] Karoline Caldwell or Larita Fife; 28 day program; must be fully detox; tailored Medicaid or no insurance  [address] in [place], [place]; [phone]; 28 day all males program; no insurance accepted  [facility] in [place]: Gabriel Rung [phone] (no insurance or Medicaid only); 90 days; outpatient services but provide housing in apartments [place]  [facility] Admission: [phone]: Patient must complete phone screening for placement: [place], [place]; 6 month program; uninsured, Medicaid, and [employer].   [facility]: no insurance required; [phone]  [facility]: [phone]; Intake: Molly Maduro; Must fill out application online; Alecia Lemming Delay [phone] x [address] [facility] in [place], [place]: [phone]; Admissions Coordinators Mr. Maurine Minister or Barron Alvine; 90 day program.  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 Free program; There is an initial $300 fee however, they are willing to work with patients regarding that. Application is  online.  [facility]: Admissions [phone] Doran Heater ext [phone]; Any 7-90 day program is out of pocket; 12 month program is free of charge; there is a $275 entry fee; Patient is responsible for own transportation

## 2023-12-24 NOTE — Group Note (Signed)
 Group Topic: Balance in Life  Group Date: 12/24/2023 Start Time: 1230 End Time: 1300 Facilitators: Concha Norway, NT  Department: [facility]  Number of Participants: 6  Group Focus: acceptance Treatment Modality:  Solution-Focused Therapy Interventions utilized were story telling Purpose: enhance coping skills  Name: [name] Date of Birth: 1971/03/01  MR: [id number]    Level of Participation: moderate Quality of Participation: attentive Interactions with others:   Mood/Affect: appropriate Triggers (if applicable):   Cognition: concrete Progress: Significant Response:   Plan: follow-up needed  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) 12/22/2023   Adjustment disorder with mixed anxiety and depressed mood 10/27/2023   Metabolic acidosis 10/22/2023   Alcohol use disorder, severe, in controlled environment (HCC) 03/25/2023   DTs (delirium tremens) (HCC) 03/25/2023   PTSD (post-traumatic stress disorder) 03/24/2023   Alcohol abuse with alcohol-induced mood disorder (HCC) 11/07/2018   MDD (major depressive disorder), recurrent severe, without psychosis (HCC) 02/17/2018   Alcohol use disorder, severe, dependence (HCC) 12/11/2017   Alcohol withdrawal (HCC) 12/10/2017   Seizure due to alcohol withdrawal (HCC) 08/30/2017   Alcohol withdrawal seizure (HCC) 08/30/2017   Tobacco abuse    Delirium tremens (HCC) 03/02/2017   GI bleed 10/06/2013   Marijuana abuse 10/06/2013   Suicidal ideation 10/05/2013   Alcohol abuse 10/26/2011   Depression with suicidal ideation 10/26/2011

## 2023-12-24 NOTE — Group Note (Signed)
 Group Topic: Change and Accountability  Group Date: 12/24/2023 Start Time: 1900 End Time: 1959 Facilitators: Rae Lips B  Department: [facility]  Number of Participants: 5  Group Focus: abuse issues, acceptance, activities of daily living skills, anger management, anxiety, chemical dependency issues, relapse prevention, and substance abuse education Treatment Modality:  Exposure Therapy and Individual Therapy Interventions utilized were leisure development, problem solving, story telling, and support Purpose: enhance coping skills, express feelings, express irrational fears, increase insight, regain self-worth, reinforce self-care, relapse prevention strategies, and trigger / craving management  Name: [name] Date of Birth: 12-24-1970  MR: [id number]    Level of Participation: minimal Quality of Participation: attentive Interactions with others: gave feedback Mood/Affect: appropriate Triggers (if applicable): NA Cognition: coherent/clear Progress: Gaining insight Response: NA Plan: patient will be encouraged to keep going to groups.   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) 12/22/2023   Adjustment disorder with mixed anxiety and depressed mood 10/27/2023   Metabolic acidosis 10/22/2023   Alcohol use disorder, severe, in controlled environment (HCC) 03/25/2023   DTs (delirium tremens) (HCC) 03/25/2023   PTSD (post-traumatic stress disorder) 03/24/2023   Alcohol abuse with alcohol-induced mood disorder (HCC) 11/07/2018   MDD (major depressive disorder), recurrent severe, without psychosis (HCC) 02/17/2018   Alcohol use disorder, severe, dependence (HCC) 12/11/2017   Alcohol withdrawal (HCC) 12/10/2017   Seizure due to alcohol withdrawal (HCC) 08/30/2017   Alcohol withdrawal seizure (HCC) 08/30/2017   Tobacco abuse    Delirium  tremens (HCC) 03/02/2017   GI bleed 10/06/2013   Marijuana abuse 10/06/2013   Suicidal ideation 10/05/2013   Alcohol abuse 10/26/2011   Depression with suicidal ideation 10/26/2011

## 2023-12-25 DIAGNOSIS — F1090 Alcohol use, unspecified, uncomplicated: Secondary | ICD-10-CM | POA: Diagnosis not present

## 2023-12-25 NOTE — ED Notes (Addendum)
 Patient A&O x 4, ambulatory. Patient discharged in no acute distress. Patient denied SI/HI, A/VH upon discharge. Patient verbalized understanding of all discharge instructions reviewed on AVS via staff, to include follow up appointments, RX's and safety. Suicide safety plan completed and reviewed with Clinical research associate. A copy given to pt. Patient reported mood 10/10.  Pt belongings returned to patient from locker #5 intact. Patient escorted to lobby via staff. Pt provided a bus pass. Safety maintained.

## 2023-12-25 NOTE — ED Provider Notes (Signed)
 FBC/OBS ASAP Discharge Summary  Date and Time: 12/25/2023 4:30 PM  Name: [name]  MRN:  [id number]   Discharge Diagnoses:  Final diagnoses:  Alcohol use disorder    Subjective:  On day of discharge the patient reports he is ready to go home.  He has denied suicidal ideations for over 48 hours, believes his mood to be stable.  Denies cravings or withdrawals from alcohol.  Declines offer for prescription medications at discharge, including hydroxyzine which he was taking during his stay.  He denies suicidal ideations.  He denies homicidal ideations.  He denies any perceptual disturbances including hallucinations, paranoid ideations, or delusional thought processes.  Patient's plan is to discharge home.  He again declines offer to connect him with additional resources at discharge.  He declines to be connected with outpatient follow-up at the [place] [facility] [place].  Patient is adamant that he does not believe he needs any medications or therapy services at discharge.  Stay Summary:  [name] is a 53 y.o. male  with a past psychiatric history of PTSD, alcohol use disorder, and numerous hospitalizations for alcohol withdrawal and substance rehab.  The patient initially presented to [facility] on 12/21/2023, brought by [employer], police were called as patient was out on the street expressing suicidal ideations and asking to be given a gun.  The patient was placed under IVC and transferred to [facility] on 12/23/2023 for detox and crisis stabilization.   HPI on admission:  Patient was evaluated on the unit this morning.  He is frustrated with his hospitalization, reporting that he has had trouble with his drinking, and only made suicidal remarks when intoxicated.  Patient reports that now that he is sober he is no longer experiencing these thoughts and would like to be discharged today.  He admits to calling 911 and behaving erratically when the cops arrived to his home.  He does admit that when he is  under the influence of alcohol, he cares very little about what happens to him and is okay with the idea of dying.  He denies any recent suicide attempts, denies currently any plan or intent to hurt himself.  Patient reports vague stressors initially, describes generalized dissatisfaction with life and that he is tired of the monotony in his daily routines he does note that he has a toxic 5-year relationship with a girlfriend, who "drives him crazy".  He reports this partner also drives him to drink, and he often thinks about breaking up with her.  He states "when I leave, I should probably tell her things are over".   Patient lacks insight into the severity of his drinking, minimizes the amount he consumes.  He states to this interviewer that he rarely drinks, reports it is less than once a month.  When informed that patient was brought here under IVC, he becomes furious, shouts "they always do this to me".  Patient becomes ruminative about how cold he is, and accuses staff of not addressing his withdrawal symptoms appropriately.  He denies any nausea, sensitivity to light, headaches, diaphoresis, gastrointestinal upset, or palpitations.   Total Time spent with patient: 1.5 hours  Substance Use Hx: Alcohol:  Reports he typically drinks on important dates--inconsistent with chart review of multiple ED visits and psychiatric hospitalizations related to alcohol use disorder.             Hx of seizures/Dts: reports it has been about 4-5 years ago since his last. Denies hx of DTs Tobacco: 1.5 ppd,  for 29 years Cannabis:smoke 1 joint nightly to help him sleep Cocaine: denies Methamphetamines: denies Psilocybin (mushrooms): denies Ecstasy (MDMA / molly): denies LSD (acid): never tried: denies Opiates (fentanyl / heroin): denies Benzos (Xanax, Klonopin): deniesdenies IVDU: denies Rehab hx:  Reports he has been to [place] [facility] [place] for detox in the past, [place], [place]. Most  recently was at [facility]   Past Psychiatric Hx: Current Psychiatrist: None Current Therapist[facility] Previous Psychiatric Diagnoses: PTSD, alcohol use disorder Psychiatric Medications: Current ? None Past ? Paroxetine and venlafaxine but reports these made things worse Psychiatric Hospitalization hx: Numerous hospitalizations for substance use disorder but no other reasons. Psychotherapy hx: Therapy at [facility] but does not describe what program Neuromodulation history: Denies History of suicide: Denies History of homicide or aggression: Denies   Past Medical History: PCP: Restaurant manager, fast food Medical Dx: None Medications: None Allergies: Paroxetine, venlafaxine, NicoDerm Hospitalizations: Numerous for alcohol-related reasons Surgeries: Numerous hernia surgeries Trauma: Denies Seizures: Denies   Family Medical History: Unsure   Family Psychiatric History: Psychiatric Dx: PTSD in father Suicide Hx: Denies Violence/Aggression: Denies Substance use: Yes in first-degree relatives   Social History: Currently live in [place] by himself Social Support: son Education: some college Occupational hx: retired, on SSI Marital Status: divorced Children: has a 6 yo son Legal: denies upcoming court dates,  Military:served in the [employer] for 3 years. [facility].   Current Medications:  No current facility-administered medications for this encounter.   No current outpatient medications on file.    PTA Medications:  Facility Ordered Medications  Medication   [COMPLETED] LORazepam (ATIVAN) tablet 1 mg       12/25/2023    8:37 AM 12/23/2023    1:14 PM  Depression screen PHQ 2/9  Decreased Interest 0 0  Down, Depressed, Hopeless 0 0  PHQ - 2 Score 0 0    Flowsheet Row ED from 12/22/2023 in [facility] ED from 12/21/2023 in [facility] Emergency Department at [facility] Admission (Discharged) [facility]  C-SSRS RISK CATEGORY High Risk High Risk High Risk       Musculoskeletal  Strength & Muscle Tone: within normal limits Gait & Station: normal Patient leans: N/A  Psychiatric Specialty Exam  Presentation  General Appearance:  Appropriate for Environment  Eye Contact: Good  Speech: Clear and Coherent; Normal Rate  Speech Volume: Normal  Handedness: -- (not assessed)   Mood and Affect  Mood: Euthymic  Affect: Congruent; Full Range   Thought Process  Thought Processes: Linear  Descriptions of Associations:Intact  Orientation:None  Thought Content:Logical     Hallucinations:Hallucinations: None  Ideas of Reference:None  Suicidal Thoughts:Suicidal Thoughts: No  Homicidal Thoughts:Homicidal Thoughts: No   Sensorium  Memory: Immediate Good; Recent Good; Remote Good  Judgment: Fair  Insight: Fair   Art therapist  Concentration: Good  Attention Span: Good  Recall: Good  Fund of Knowledge: Good  Language: Good   Psychomotor Activity  Psychomotor Activity:Psychomotor Activity: Normal   Assets  Assets: Desire for Improvement; Resilience; Communication Skills   Sleep  Sleep:Sleep: Good    Physical Exam  Physical Exam Vitals and nursing note reviewed.  Constitutional:      General: He is not in acute distress.    Appearance: Normal appearance. He is not ill-appearing.  HENT:     Head: Normocephalic and atraumatic.  Eyes:     Extraocular Movements: Extraocular movements intact.  Conjunctiva/sclera: Conjunctivae normal.  Pulmonary:     Effort: Pulmonary effort is normal. No respiratory distress.  Musculoskeletal:        General: Normal range of motion.  Skin:    General: Skin is warm and dry.  Neurological:     General: No focal deficit present.     Mental Status: He is alert.    Review of Systems  All other systems reviewed and are negative.  Blood pressure (!)  105/59, pulse 70, temperature 98 F (36.7 C), temperature source Oral, resp. rate 18, SpO2 99%. There is no height or weight on file to calculate BMI.  Demographic Factors:  Male, Caucasian, Living alone, and Unemployed  Loss Factors: Legal issues  Historical Factors: Impulsivity  Risk Reduction Factors:   Positive therapeutic relationship  Continued Clinical Symptoms:  Alcohol/Substance Abuse/Dependencies Previous Psychiatric Diagnoses and Treatments  Cognitive Features That Contribute To Risk:  None    Suicide Risk:  Mild:  Suicidal ideation of limited frequency, intensity, duration, and specificity.  There are no identifiable plans, no associated intent, mild dysphoria and related symptoms, good self-control (both objective and subjective assessment), few other risk factors, and identifiable protective factors, including available and accessible social support.  Plan Of Care/Follow-up recommendations:  Activity: as tolerated  Diet: heart healthy  Other: -Follow-up with your outpatient psychiatric provider -instructions on appointment date, time, and address (location) are provided to you in discharge paperwork.  -Take your psychiatric medications as prescribed at discharge - instructions are provided to you in the discharge paperwork   -If you are prescribed an atypical antipsychotic medication, we recommend that your outpatient psychiatrist follow routine screening for side effects within 3 months of discharge, including monitoring: AIMS scale, height, weight, blood pressure, fasting lipid panel, HbA1c, and fasting blood sugar.   -Recommend total abstinence from alcohol, tobacco, and other illicit drug use at discharge.   -If your psychiatric symptoms recur, worsen, or if you have side effects to your psychiatric medications, call your outpatient psychiatric provider, 911, [phone] or go to the nearest emergency department.  -If suicidal thoughts occur, immediately call your  outpatient psychiatric provider, 911, [phone] or go to the nearest emergency department.   Disposition: Home --patient declined offer to arrange scheduled outpatient appointments for [facility], MD 12/25/2023, 4:30 PM

## 2023-12-25 NOTE — ED Notes (Signed)
 Pt in the bedroom with eyes closed,composed and sleeping. NAD, respirations are even and unlabored.  Will continue to monitor for safety.

## 2023-12-25 NOTE — ED Notes (Signed)
 Patient is in the bed comfortably sleeping. NAD. Will monitor for safety.

## 2023-12-25 NOTE — ED Notes (Signed)
 Patient A&Ox4. Denies intent to harm self/others when asked. Denies A/VH. Patient denies any physical complaints when asked. No acute distress noted. Support and encouragement provided. Routine safety checks conducted according to facility protocol. Encouraged patient to notify staff if thoughts of harm toward self or others arise. Patient verbalize understanding and agreement. Will continue to monitor for safety.

## 2023-12-26 ENCOUNTER — Emergency Department (HOSPITAL_COMMUNITY)
Admission: EM | Admit: 2023-12-26 | Discharge: 2023-12-27 | Discharge status: Against Medical Advice | Attending: Emergency Medicine | Admitting: Emergency Medicine

## 2023-12-26 ENCOUNTER — Other Ambulatory Visit: Payer: Self-pay

## 2023-12-26 ENCOUNTER — Encounter (HOSPITAL_COMMUNITY): Payer: Self-pay | Admitting: Emergency Medicine

## 2023-12-26 DIAGNOSIS — F1990 Other psychoactive substance use, unspecified, uncomplicated: Secondary | ICD-10-CM | POA: Diagnosis present

## 2023-12-26 DIAGNOSIS — F4310 Post-traumatic stress disorder, unspecified: Secondary | ICD-10-CM | POA: Diagnosis not present

## 2023-12-26 NOTE — ED Triage Notes (Addendum)
 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 Denies SI or HI. Denis hallucinations. Reports he just wants help w/ PTSD. States he takes no meds. Pt reports he is also detoxing from ETOH and his last drink was this morning.

## 2023-12-27 LAB — CBC WITH DIFFERENTIAL/PLATELET
Abs Immature Granulocytes: 0.02 10*3/uL (ref 0.00–0.07)
Basophils Absolute: 0 10*3/uL (ref 0.0–0.1)
Basophils Relative: 0 %
Eosinophils Absolute: 0.3 10*3/uL (ref 0.0–0.5)
Eosinophils Relative: 2 %
HCT: 44.3 % (ref 39.0–52.0)
Hemoglobin: 14.7 g/dL (ref 13.0–17.0)
Immature Granulocytes: 0 %
Lymphocytes Relative: 32 %
Lymphs Abs: 3.7 10*3/uL (ref 0.7–4.0)
MCH: 26.9 pg (ref 26.0–34.0)
MCHC: 33.2 g/dL (ref 30.0–36.0)
MCV: 81.1 fL (ref 80.0–100.0)
Monocytes Absolute: 0.6 10*3/uL (ref 0.1–1.0)
Monocytes Relative: 5 %
Neutro Abs: 7 10*3/uL (ref 1.7–7.7)
Neutrophils Relative %: 61 %
Platelets: 205 10*3/uL (ref 150–400)
RBC: 5.46 MIL/uL (ref 4.22–5.81)
RDW: 14.5 % (ref 11.5–15.5)
WBC: 11.7 10*3/uL — ABNORMAL HIGH (ref 4.0–10.5)
nRBC: 0 % (ref 0.0–0.2)

## 2023-12-27 LAB — COMPREHENSIVE METABOLIC PANEL
ALT: 42 U/L (ref 0–44)
AST: 48 U/L — ABNORMAL HIGH (ref 15–41)
Albumin: 4.4 g/dL (ref 3.5–5.0)
Alkaline Phosphatase: 57 U/L (ref 38–126)
Anion gap: 14 (ref 5–15)
BUN: 14 mg/dL (ref 6–20)
CO2: 20 mmol/L — ABNORMAL LOW (ref 22–32)
Calcium: 9.2 mg/dL (ref 8.9–10.3)
Chloride: 106 mmol/L (ref 98–111)
Creatinine, Ser: 0.78 mg/dL (ref 0.61–1.24)
GFR, Estimated: >60 mL/min (ref >60)
Glucose, Bld: 106 mg/dL — ABNORMAL HIGH (ref 70–99)
Potassium: 3.3 mmol/L — ABNORMAL LOW (ref 3.5–5.1)
Sodium: 140 mmol/L (ref 135–145)
Total Bilirubin: 1 mg/dL (ref 0.0–1.2)
Total Protein: 7.9 g/dL (ref 6.5–8.1)

## 2023-12-27 LAB — RAPID URINE DRUG SCREEN, HOSP PERFORMED
Amphetamines: NOT DETECTED
Barbiturates: NOT DETECTED
Benzodiazepines: NOT DETECTED
Cocaine: NOT DETECTED
Opiates: NOT DETECTED
Tetrahydrocannabinol: POSITIVE — AB

## 2023-12-27 LAB — SALICYLATE LEVEL: Salicylate Lvl: <7 mg/dL — ABNORMAL LOW (ref 7.0–30.0)

## 2023-12-27 LAB — ACETAMINOPHEN LEVEL: Acetaminophen (Tylenol), Serum: <10 ug/mL — ABNORMAL LOW (ref 10–30)

## 2023-12-27 LAB — ETHANOL: Alcohol, Ethyl (B): 176 mg/dL — ABNORMAL HIGH (ref <10)

## 2023-12-27 MED ORDER — STERILE WATER FOR INJECTION IJ SOLN
INTRAMUSCULAR | Status: AC
  Administered 2023-12-27: 10 mL
  Filled 2023-12-27: qty 10

## 2023-12-27 MED ORDER — ONDANSETRON HCL 4 MG PO TABS: 4.0000 mg | ORAL_TABLET | Freq: Three times a day (TID) | ORAL | Status: DC | PRN

## 2023-12-27 MED ORDER — ZIPRASIDONE MESYLATE 20 MG IM SOLR
INTRAMUSCULAR | Status: AC
  Administered 2023-12-27: 20 mg via INTRAMUSCULAR
  Filled 2023-12-27: qty 20

## 2023-12-27 MED ORDER — ALUM & MAG HYDROXIDE-SIMETH 200-200-20 MG/5ML PO SUSP: 30.0000 mL | Freq: Four times a day (QID) | ORAL | Status: DC | PRN

## 2023-12-27 MED ORDER — ZIPRASIDONE MESYLATE 20 MG IM SOLR: 20.0000 mg | Freq: Once | INTRAMUSCULAR | Status: AC

## 2023-12-27 MED ORDER — IBUPROFEN 200 MG PO TABS: 600.0000 mg | ORAL_TABLET | Freq: Three times a day (TID) | ORAL | Status: DC | PRN

## 2023-12-27 NOTE — ED Provider Notes (Signed)
 Patient was aggressive and pacing upon exam in hall C.  He was refusing to answer EDP questions stating " I dont have to tell you what or how much I drink.  My IQ is 68."  EDP stated she was just trying to help.  Patient initially refused an exam and I stated if he wanted to be here I do the medical clearance for psychiatry and am medically legally bound to do an exam.  Patient then complied with exam and repeatedly stated he was not SI or HI.  No AH or VH.  He asked for a shot for his symptoms.  He migrated to hall D.  Was given Geodon with effect and went to sleep.  Was medically cleared for psychiatry.  Then yelling and cursing was heard in the hallway.  Patient was asked to changed into maroon scrubs by staff prior to transfer to TCU. Patient then was yelling and angry and aggressive with staff.  He stated with security, [employer] and multiple nurse including Heriberto Antigua present that he was not SI.  He is not SI or HI.  EDP stated he was here voluntarily and does not have to stay if he does not wish to.  He initially stated he would comply with policy.  Then it was reported patient stated he does not wish to stay.  He is not SI or HI,  no AH or VH.  He is not committable.  He has left against medical advice.     Berline Semrad, MD 12/27/23 [phone]

## 2023-12-27 NOTE — ED Notes (Addendum)
 While attempting to get the patient to change into purple paper scrubs to go back to the unit ([facility]). The patient got upset and kept screaming that he wasn't suicidal. He never said that he was suicidal. And that he wasn't changing into the purple paper scrubs. The patient however did change into the paper scrubs and he was still screaming through the bathroom door that he wasn't suicidal. And that he came to the hospital because he didn't was to have a seizure and die.

## 2023-12-27 NOTE — ED Notes (Addendum)
 While attempting to change out patient to paper scrubs to go back to the unit. Patient got upset and kept stating why he isn't suicidal, He never said he was suicidal. And he wasn't going to change. Got him to change out and still said he told her that he was not suicidal he just didn't want to have a seizure and die he is not suicidal.

## 2023-12-27 NOTE — ED Notes (Signed)
 Pt was prepped to be transferred to room 35. RN had given report to this Clinical research associate. Pt had been changed into purple scrubs. Pt was escorted to room 35 by RN and security. Pt was visibly agitated and yelling "I am not suicidal, I just want to leave now!" Pt advised that he did not want to be moved to [facility] as he was not suicidal. Pt informed that staff understood he was not suicidal but were moving him since he was medically cleared and awaiting TTS consult. Pt advised that he still wanted to leave since he had come voluntarily. Provider informed of the situation and advised the pt could leave since he was voluntary and not suicidal. Pt given his belongings and changed back into his clothes.

## 2023-12-27 NOTE — ED Notes (Addendum)
 Pt informed about being moved to [facility] and needing to be dressed into hospital appropriate scrubs.  While giving report to receiving nurse a call over the radio for security made to [place] D.  This RN returned to find patient in bathroom yelling at EDP, security, and other staff with psych scrubs on but still had hat and shoes and becoming more irate when asked to remove those items.  Pt continuously repeating he doesn't want to go to the "psych ward" and states "I said I'm not suicidal".  Pt eventually taken to behavioral unit but upon entering room walks out immediately and says "fuck this I'm not staying, I told you guys I'm not suicidal" and asking how to get out of here.  Pt given belongings back and dressed in bathroom and escorted out of ED with security.  MD notified of leaving.

## 2023-12-27 NOTE — ED Notes (Signed)
 Pt resting comfortably with eyes closed

## 2023-12-27 NOTE — ED Provider Notes (Signed)
 [place] EMERGENCY DEPARTMENT AT [facility] Provider Note   CSN: [id number] Arrival date & time: 12/26/23  2311     History  Chief Complaint  Patient presents with   Post-Traumatic Stress Disorder    [name] is a 53 y.o. male.  The history is provided by the patient.  Alcohol Problem This is a chronic problem. The current episode started more than 1 week ago. The problem occurs constantly. The problem has not changed since onset.Pertinent negatives include no chest pain, no abdominal pain, no headaches and no shortness of breath. Nothing aggravates the symptoms. Nothing relieves the symptoms. Treatments tried: seen in [facility]. The treatment provided no relief.       Home Medications Prior to Admission medications   Not on File      Allergies    Effexor [venlafaxine], Paroxetine, and Paxil [paroxetine hcl]    Review of Systems   Review of Systems  Respiratory:  Negative for shortness of breath.   Cardiovascular:  Negative for chest pain.  Gastrointestinal:  Negative for abdominal pain.  Neurological:  Negative for headaches.  Psychiatric/Behavioral:  Negative for confusion, decreased concentration, hallucinations and suicidal ideas.   All other systems reviewed and are negative.   Physical Exam Updated Vital Signs BP (!) 144/95 (BP Location: Left Arm)   Pulse (!) 104   Temp 98 F (36.7 C) (Oral)   Resp 19   SpO2 94%  Physical Exam Vitals and nursing note reviewed.  Constitutional:      General: He is not in acute distress.    Appearance: He is well-developed. He is not diaphoretic.  HENT:     Head: Normocephalic and atraumatic.     Nose: Nose normal.  Eyes:     Conjunctiva/sclera: Conjunctivae normal.     Pupils: Pupils are equal, round, and reactive to light.  Cardiovascular:     Rate and Rhythm: Normal rate and regular rhythm.     Pulses: Normal pulses.     Heart sounds: Normal heart sounds.  Pulmonary:     Effort: Pulmonary  effort is normal.     Breath sounds: Normal breath sounds. No wheezing or rales.  Abdominal:     General: Bowel sounds are normal.     Palpations: Abdomen is soft.     Tenderness: There is no abdominal tenderness. There is no guarding or rebound.  Musculoskeletal:        General: Normal range of motion.     Cervical back: Normal range of motion and neck supple.  Skin:    General: Skin is warm and dry.     Capillary Refill: Capillary refill takes less than 2 seconds.  Neurological:     General: No focal deficit present.     Mental Status: He is alert and oriented to person, place, and time.     Deep Tendon Reflexes: Reflexes normal.     ED Results / Procedures / Treatments   Labs (all labs ordered are listed, but only abnormal results are displayed) Results for orders placed or performed during the hospital encounter of 12/26/23  Comprehensive metabolic panel   Collection Time: 12/27/23 12:25 AM  Result Value Ref Range   Sodium 140 135 - 145 mmol/L   Potassium 3.3 (L) 3.5 - 5.1 mmol/L   Chloride 106 98 - 111 mmol/L   CO2 20 (L) 22 - 32 mmol/L   Glucose, Bld 106 (H) 70 - 99 mg/dL   BUN 14 6 - 20 mg/dL  Creatinine, Ser 0.78 0.61 - 1.24 mg/dL   Calcium 9.2 8.9 - 16.1 mg/dL   Total Protein 7.9 6.5 - 8.1 g/dL   Albumin 4.4 3.5 - 5.0 g/dL   AST 48 (H) 15 - 41 U/L   ALT 42 0 - 44 U/L   Alkaline Phosphatase 57 38 - 126 U/L   Total Bilirubin 1.0 0.0 - 1.2 mg/dL   GFR, Estimated >09 >60 mL/min   Anion gap 14 5 - 15  Salicylate level   Collection Time: 12/27/23 12:25 AM  Result Value Ref Range   Salicylate Lvl <7.0 (L) 7.0 - 30.0 mg/dL  Acetaminophen level   Collection Time: 12/27/23 12:25 AM  Result Value Ref Range   Acetaminophen (Tylenol), Serum <10 (L) 10 - 30 ug/mL  Ethanol   Collection Time: 12/27/23 12:25 AM  Result Value Ref Range   Alcohol, Ethyl (B) 176 (H) <10 mg/dL  Urine rapid drug screen (hosp performed)   Collection Time: 12/27/23 12:25 AM  Result Value Ref  Range   Opiates NONE DETECTED NONE DETECTED   Cocaine NONE DETECTED NONE DETECTED   Benzodiazepines NONE DETECTED NONE DETECTED   Amphetamines NONE DETECTED NONE DETECTED   Tetrahydrocannabinol POSITIVE (A) NONE DETECTED   Barbiturates NONE DETECTED NONE DETECTED  CBC WITH DIFFERENTIAL   Collection Time: 12/27/23 12:25 AM  Result Value Ref Range   WBC 11.7 (H) 4.0 - 10.5 K/uL   RBC 5.46 4.22 - 5.81 MIL/uL   Hemoglobin 14.7 13.0 - 17.0 g/dL   HCT 45.4 09.8 - 11.9 %   MCV 81.1 80.0 - 100.0 fL   MCH 26.9 26.0 - 34.0 pg   MCHC 33.2 30.0 - 36.0 g/dL   RDW 14.7 82.9 - 56.2 %   Platelets 205 150 - 400 K/uL   nRBC 0.0 0.0 - 0.2 %   Neutrophils Relative % 61 %   Neutro Abs 7.0 1.7 - 7.7 K/uL   Lymphocytes Relative 32 %   Lymphs Abs 3.7 0.7 - 4.0 K/uL   Monocytes Relative 5 %   Monocytes Absolute 0.6 0.1 - 1.0 K/uL   Eosinophils Relative 2 %   Eosinophils Absolute 0.3 0.0 - 0.5 K/uL   Basophils Relative 0 %   Basophils Absolute 0.0 0.0 - 0.1 K/uL   Immature Granulocytes 0 %   Abs Immature Granulocytes 0.02 0.00 - 0.07 K/uL   No results found.   EKG EKG Interpretation Date/Time:  Sunday December 27 2023 00:16:23 EDT Ventricular Rate:  95 PR Interval:  140 QRS Duration:  96 QT Interval:  358 QTC Calculation: 449 R Axis:   56  Text Interpretation: Normal sinus rhythm Confirmed by Nicanor Alcon, Inessa Wardrop ([id number]) on 12/27/2023 1:29:36 AM  Radiology No results found.  Procedures Procedures    Medications Ordered in ED Medications  ondansetron (ZOFRAN) tablet 4 mg (has no administration in time range)  ibuprofen (ADVIL) tablet 600 mg (has no administration in time range)  alum & mag hydroxide-simeth (MAALOX/MYLANTA) 200-200-20 MG/5ML suspension 30 mL (has no administration in time range)  ziprasidone (GEODON) injection 20 mg (20 mg Intramuscular Given 12/27/23 0015)  sterile water (preservative free) injection (10 mLs  Given 12/27/23 0015)    ED Course/ Medical Decision Making/ A&P                                  Medical Decision Making Patient with alcohol use disorder presents with wanting DETOX  Amount and/or Complexity of Data Reviewed External Data Reviewed: notes.    Details: Previous notes reviewed  Labs: ordered.    Details: Normal sodium 140, potassium 3.3, normal creatinine 0.78 slight elevation AST 48, normal ALT and bilirubin.  UDS positive for marijuana   Risk OTC drugs. Prescription drug management. Risk Details: Patient asked for injection for his PTSD and ETOH. Patient decided to relocate from hall C to hall D.   Patient was given GEODOn and said "thank you doctor" labs initiated.  Patient is now sleeping soundly post medication.  Patient placed in ED hold for TTS. Patient has denied SI and HI to me and all nurses and staff presents.   Patient has decided to leave AMA   AST 48, normal ALT and bilirubin  Final Clinical Impression(s) / ED Diagnoses Final diagnoses:  Polysubstance use disorder    Rx / DC Orders ED Discharge Orders     None         Gabriellah Rabel, MD 12/27/23 [phone]

## 2024-01-01 ENCOUNTER — Encounter (HOSPITAL_COMMUNITY): Payer: Self-pay | Admitting: Emergency Medicine

## 2024-01-01 ENCOUNTER — Inpatient Hospital Stay (HOSPITAL_COMMUNITY)
Admission: EM | Admit: 2024-01-01 | Discharge: 2024-01-02 | DRG: 894 | Discharge status: Against Medical Advice | Attending: Family Medicine | Admitting: Family Medicine

## 2024-01-01 ENCOUNTER — Other Ambulatory Visit: Payer: Self-pay

## 2024-01-01 DIAGNOSIS — Z720 Tobacco use: Secondary | ICD-10-CM | POA: Diagnosis present

## 2024-01-01 DIAGNOSIS — F10939 Alcohol use, unspecified with withdrawal, unspecified: Secondary | ICD-10-CM | POA: Diagnosis present

## 2024-01-01 DIAGNOSIS — Z781 Physical restraint status: Secondary | ICD-10-CM

## 2024-01-01 DIAGNOSIS — F10239 Alcohol dependence with withdrawal, unspecified: Principal | ICD-10-CM | POA: Diagnosis present

## 2024-01-01 DIAGNOSIS — F1014 Alcohol abuse with alcohol-induced mood disorder: Secondary | ICD-10-CM | POA: Diagnosis present

## 2024-01-01 DIAGNOSIS — R45851 Suicidal ideations: Secondary | ICD-10-CM | POA: Diagnosis present

## 2024-01-01 DIAGNOSIS — F10220 Alcohol dependence with intoxication, uncomplicated: Secondary | ICD-10-CM | POA: Diagnosis present

## 2024-01-01 DIAGNOSIS — F17210 Nicotine dependence, cigarettes, uncomplicated: Secondary | ICD-10-CM | POA: Diagnosis present

## 2024-01-01 DIAGNOSIS — F411 Generalized anxiety disorder: Secondary | ICD-10-CM | POA: Diagnosis present

## 2024-01-01 DIAGNOSIS — Z91148 Patient's other noncompliance with medication regimen for other reason: Secondary | ICD-10-CM

## 2024-01-01 DIAGNOSIS — E876 Hypokalemia: Secondary | ICD-10-CM | POA: Diagnosis present

## 2024-01-01 DIAGNOSIS — Y908 Blood alcohol level of 240 mg/100 ml or more: Secondary | ICD-10-CM | POA: Diagnosis present

## 2024-01-01 DIAGNOSIS — F10920 Alcohol use, unspecified with intoxication, uncomplicated: Principal | ICD-10-CM

## 2024-01-01 DIAGNOSIS — Z9151 Personal history of suicidal behavior: Secondary | ICD-10-CM

## 2024-01-01 DIAGNOSIS — Z888 Allergy status to other drugs, medicaments and biological substances status: Secondary | ICD-10-CM

## 2024-01-01 DIAGNOSIS — F3181 Bipolar II disorder: Secondary | ICD-10-CM | POA: Diagnosis present

## 2024-01-01 DIAGNOSIS — F4310 Post-traumatic stress disorder, unspecified: Secondary | ICD-10-CM | POA: Diagnosis present

## 2024-01-01 DIAGNOSIS — F1024 Alcohol dependence with alcohol-induced mood disorder: Secondary | ICD-10-CM | POA: Diagnosis present

## 2024-01-01 DIAGNOSIS — Z811 Family history of alcohol abuse and dependence: Secondary | ICD-10-CM

## 2024-01-01 DIAGNOSIS — Z79899 Other long term (current) drug therapy: Secondary | ICD-10-CM

## 2024-01-01 LAB — CBC WITH DIFFERENTIAL/PLATELET
Abs Immature Granulocytes: 0.03 10*3/uL (ref 0.00–0.07)
Basophils Absolute: 0 10*3/uL (ref 0.0–0.1)
Basophils Relative: 1 %
Eosinophils Absolute: 0 10*3/uL (ref 0.0–0.5)
Eosinophils Relative: 0 %
HCT: 43.7 % (ref 39.0–52.0)
Hemoglobin: 14.8 g/dL (ref 13.0–17.0)
Immature Granulocytes: 0 %
Lymphocytes Relative: 22 %
Lymphs Abs: 1.6 10*3/uL (ref 0.7–4.0)
MCH: 27.2 pg (ref 26.0–34.0)
MCHC: 33.9 g/dL (ref 30.0–36.0)
MCV: 80.3 fL (ref 80.0–100.0)
Monocytes Absolute: 0.8 10*3/uL (ref 0.1–1.0)
Monocytes Relative: 11 %
Neutro Abs: 4.8 10*3/uL (ref 1.7–7.7)
Neutrophils Relative %: 66 %
Platelets: 234 10*3/uL (ref 150–400)
RBC: 5.44 MIL/uL (ref 4.22–5.81)
RDW: 14.6 % (ref 11.5–15.5)
WBC: 7.2 10*3/uL (ref 4.0–10.5)
nRBC: 0 % (ref 0.0–0.2)

## 2024-01-01 LAB — RAPID URINE DRUG SCREEN, HOSP PERFORMED
Amphetamines: NOT DETECTED
Barbiturates: NOT DETECTED
Benzodiazepines: NOT DETECTED
Cocaine: NOT DETECTED
Opiates: NOT DETECTED
Tetrahydrocannabinol: POSITIVE — AB

## 2024-01-01 LAB — COMPREHENSIVE METABOLIC PANEL WITH GFR
ALT: 42 U/L (ref 0–44)
AST: 58 U/L — ABNORMAL HIGH (ref 15–41)
Albumin: 4.7 g/dL (ref 3.5–5.0)
Alkaline Phosphatase: 62 U/L (ref 38–126)
Anion gap: 14 (ref 5–15)
BUN: 8 mg/dL (ref 6–20)
CO2: 24 mmol/L (ref 22–32)
Calcium: 8.6 mg/dL — ABNORMAL LOW (ref 8.9–10.3)
Chloride: 96 mmol/L — ABNORMAL LOW (ref 98–111)
Creatinine, Ser: 0.65 mg/dL (ref 0.61–1.24)
GFR, Estimated: >60 mL/min (ref >60)
Glucose, Bld: 113 mg/dL — ABNORMAL HIGH (ref 70–99)
Potassium: 3.4 mmol/L — ABNORMAL LOW (ref 3.5–5.1)
Sodium: 134 mmol/L — ABNORMAL LOW (ref 135–145)
Total Bilirubin: 0.5 mg/dL (ref 0.0–1.2)
Total Protein: 7.8 g/dL (ref 6.5–8.1)

## 2024-01-01 LAB — SALICYLATE LEVEL: Salicylate Lvl: <7 mg/dL — ABNORMAL LOW (ref 7.0–30.0)

## 2024-01-01 LAB — ACETAMINOPHEN LEVEL: Acetaminophen (Tylenol), Serum: <10 ug/mL — ABNORMAL LOW (ref 10–30)

## 2024-01-01 LAB — ETHANOL: Alcohol, Ethyl (B): 420 mg/dL (ref <10)

## 2024-01-01 MED ORDER — LACTATED RINGERS IV BOLUS
1000.0000 mL | Freq: Once | INTRAVENOUS | Status: AC
  Administered 2024-01-01: 1000 mL via INTRAVENOUS

## 2024-01-01 MED ORDER — LORAZEPAM 2 MG/ML IJ SOLN
1.0000 mg | INTRAMUSCULAR | Status: DC | PRN
  Administered 2024-01-02: 2 mg via INTRAVENOUS
  Administered 2024-01-02: 1 mg via INTRAVENOUS
  Administered 2024-01-02 (×2): 2 mg via INTRAVENOUS
  Administered 2024-01-02: 1 mg via INTRAVENOUS
  Administered 2024-01-02: 2 mg via INTRAVENOUS
  Filled 2024-01-01 (×6): qty 1

## 2024-01-01 MED ORDER — LORAZEPAM 2 MG/ML IJ SOLN
2.0000 mg | Freq: Once | INTRAMUSCULAR | Status: DC
  Filled 2024-01-01: qty 1

## 2024-01-01 MED ORDER — PHENOBARBITAL 32.4 MG PO TABS
32.4000 mg | ORAL_TABLET | Freq: Once | ORAL | Status: AC
  Administered 2024-01-01: 32.4 mg via ORAL
  Filled 2024-01-01: qty 1

## 2024-01-01 MED ORDER — DROPERIDOL 2.5 MG/ML IJ SOLN
5.0000 mg | Freq: Once | INTRAMUSCULAR | Status: AC
  Administered 2024-01-01: 5 mg via INTRAMUSCULAR
  Filled 2024-01-01: qty 2

## 2024-01-01 MED ORDER — FOLIC ACID 1 MG PO TABS
1.0000 mg | ORAL_TABLET | Freq: Every day | ORAL | Status: DC
  Administered 2024-01-02: 1 mg via ORAL
  Filled 2024-01-01: qty 1

## 2024-01-01 MED ORDER — LORAZEPAM 2 MG/ML IJ SOLN
2.0000 mg | Freq: Once | INTRAMUSCULAR | Status: AC
  Administered 2024-01-01: 2 mg via INTRAMUSCULAR

## 2024-01-01 MED ORDER — ADULT MULTIVITAMIN W/MINERALS CH
1.0000 | ORAL_TABLET | Freq: Every day | ORAL | Status: DC
  Administered 2024-01-02: 1 via ORAL
  Filled 2024-01-01: qty 1

## 2024-01-01 MED ORDER — LORAZEPAM 1 MG PO TABS
1.0000 mg | ORAL_TABLET | ORAL | Status: DC | PRN
  Administered 2024-01-02: 2 mg via ORAL
  Filled 2024-01-01: qty 2

## 2024-01-01 MED ORDER — THIAMINE MONONITRATE 100 MG PO TABS
100.0000 mg | ORAL_TABLET | Freq: Every day | ORAL | Status: DC
  Administered 2024-01-02: 100 mg via ORAL
  Filled 2024-01-01 (×2): qty 1

## 2024-01-01 MED ORDER — SODIUM CHLORIDE 0.9 % IV BOLUS
1000.0000 mL | Freq: Once | INTRAVENOUS | Status: AC
  Administered 2024-01-01: 1000 mL via INTRAVENOUS

## 2024-01-01 MED ORDER — THIAMINE HCL 100 MG/ML IJ SOLN
100.0000 mg | Freq: Every day | INTRAMUSCULAR | Status: DC
  Filled 2024-01-01: qty 2

## 2024-01-01 MED ORDER — LORAZEPAM 2 MG/ML IJ SOLN
1.0000 mg | Freq: Once | INTRAMUSCULAR | Status: AC
  Administered 2024-01-01: 1 mg via INTRAVENOUS
  Filled 2024-01-01: qty 1

## 2024-01-01 NOTE — BH Assessment (Signed)
 Clinician messaged Kavin Leech. Tiburcio Pea, RN: "Hey. It's Trey with TTS. Is the pt able to engage in the assessment, if so is the pt under IVC? Also is the pt medically cleared?"  Clinician awaiting response.    Redmond Pulling, MS, [facility] Triage Specialist [phone]

## 2024-01-01 NOTE — ED Notes (Signed)
 Pt placed on 2L [place] due to O2 sats dipping into 82-85% for a period of time. RN made aware amd RN just said okay.

## 2024-01-01 NOTE — ED Triage Notes (Signed)
 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  He has been drinking this morning states "I am going to die tonight" when asking him how he is going to die tonight he states "from alcohol". Wants to die but has no plan

## 2024-01-01 NOTE — ED Provider Notes (Signed)
 [place] EMERGENCY DEPARTMENT AT [facility] Provider Note   CSN: [id number] Arrival date & time: 01/01/24  1231     History  Chief Complaint  Patient presents with   Alcohol Intoxication   Psychiatric Evaluation    [name] is a 53 y.o. male.  With a history of depression, suicidal ideation and alcohol use who presents to the ED for alcohol intoxication.  Patient was noted to be publicly intoxicated and brought to the ED for further evaluation after making vague SI statements.  He is clinically intoxicated in the ED but does endorse suicidal ideation stating that, "I just want to die."  He has not made any attempts to harm himself or others.  Denies overdose or self-injurious behavior.  Drink an undisclosed amount today but denies other drug use.   Alcohol Intoxication       Home Medications Prior to Admission medications   Not on File      Allergies    Effexor [venlafaxine], Paroxetine, and Paxil [paroxetine hcl]    Review of Systems   Review of Systems  Physical Exam Updated Vital Signs BP 131/72   Pulse (!) 119   Temp 98.6 F (37 C) (Oral)   Resp 16   Ht 5\' 8"  (1.727 m)   Wt 73.5 kg   SpO2 97%   BMI 24.63 kg/m  Physical Exam Vitals and nursing note reviewed.  Constitutional:      Comments: Intoxicated  HENT:     Head: Normocephalic and atraumatic.  Eyes:     Pupils: Pupils are equal, round, and reactive to light.  Cardiovascular:     Rate and Rhythm: Normal rate and regular rhythm.  Pulmonary:     Effort: Pulmonary effort is normal.     Breath sounds: Normal breath sounds.  Abdominal:     Palpations: Abdomen is soft.     Tenderness: There is no abdominal tenderness.  Skin:    General: Skin is warm and dry.  Neurological:     Mental Status: He is alert.  Psychiatric:        Mood and Affect: Mood normal.     ED Results / Procedures / Treatments   Labs (all labs ordered are listed, but only abnormal results are  displayed) Labs Reviewed  ACETAMINOPHEN LEVEL - Abnormal; Notable for the following components:      Result Value   Acetaminophen (Tylenol), Serum <10 (*)    All other components within normal limits  ETHANOL - Abnormal; Notable for the following components:   Alcohol, Ethyl (B) 420 (*)    All other components within normal limits  SALICYLATE LEVEL - Abnormal; Notable for the following components:   Salicylate Lvl <7.0 (*)    All other components within normal limits  COMPREHENSIVE METABOLIC PANEL WITH GFR - Abnormal; Notable for the following components:   Sodium 134 (*)    Potassium 3.4 (*)    Chloride 96 (*)    Glucose, Bld 113 (*)    Calcium 8.6 (*)    AST 58 (*)    All other components within normal limits  RAPID URINE DRUG SCREEN, HOSP PERFORMED - Abnormal; Notable for the following components:   Tetrahydrocannabinol POSITIVE (*)    All other components within normal limits  CBC WITH DIFFERENTIAL/PLATELET    EKG EKG Interpretation Date/Time:  Friday January 01 2024 14:54:25 EDT Ventricular Rate:  120 PR Interval:  139 QRS Duration:  100 QT Interval:  325 QTC Calculation: 460  R Axis:   -48  Text Interpretation: Sinus tachycardia Left axis deviation Borderline low voltage, extremity leads Consider anterior infarct Confirmed by Estelle June [phone]) on 01/01/2024 3:15:34 PM  Radiology No results found.  Procedures Procedures    Medications Ordered in ED Medications  sodium chloride 0.9 % bolus 1,000 mL (0 mLs Intravenous Stopped 01/01/24 1345)  LORazepam (ATIVAN) injection 1 mg (1 mg Intravenous Given 01/01/24 1327)  LORazepam (ATIVAN) injection 2 mg (2 mg Intramuscular Given 01/01/24 1439)  droperidol (INAPSINE) 2.5 MG/ML injection 5 mg (5 mg Intramuscular Given 01/01/24 1439)    ED Course/ Medical Decision Making/ A&P Clinical Course as of 01/01/24 1515  Fri Jan 01, 2024  1420 Unfortunately this patient did become significantly agitated and could no longer be  verbally redirected.  He required 4 point restraints for his safety and the safety of our staff.  Chemical sedation with droperidol and Ativan.  Laboratory workup notable for blood alcohol level of 420 positive THC [MP]  1514 I, Estelle June DO, am transitioning care of this patient to the oncoming provider pending reevaluation after sedating medications have worn off, TTS consult for suicidal ideation and disposition [MP]    Clinical Course User Index [MP] [name], DO                                 Medical Decision Making 53 year old male with history of depression SI and alcohol use comes in for alcohol intoxication.  Making vague suicidal statements with no plan.  No intent to self-harm.  He is requesting medication to help him calm down.  Will obtain basic clearance labs for psychiatric clearance, provide IV fluids for rehydration, 1 mg Ativan to help with his agitation and continue to monitor on CIWA.  He is intoxicated and agitated but redirectable for now.  If he becomes agitated and aggressive we may need to use chemical sedation  Amount and/or Complexity of Data Reviewed Labs: ordered.  Risk Prescription drug management.           Final Clinical Impression(s) / ED Diagnoses Final diagnoses:  Alcoholic intoxication without complication (HCC)  Suicidal ideation    Rx / DC Orders ED Discharge Orders     None         Delshon, [place], DO 01/01/24 1515

## 2024-01-01 NOTE — ED Provider Notes (Signed)
  Physical Exam  BP (!) 132/99   Pulse (!) 113   Temp (!) 97 F (36.1 C)   Resp 17   Ht 5\' 8"  (1.727 m)   Wt 73.5 kg   SpO2 98%   BMI 24.63 kg/m   Physical Exam  Procedures  Procedures  ED Course / MDM   Clinical Course as of 01/01/24 2126  Fri Jan 01, 2024  1420 Unfortunately this patient did become significantly agitated and could no longer be verbally redirected.  He required 4 point restraints for his safety and the safety of our staff.  Chemical sedation with droperidol and Ativan.  Laboratory workup notable for blood alcohol level of 420 positive THC [MP]  1514 I, Estelle June DO, am transitioning care of this patient to the oncoming provider pending reevaluation after sedating medications have worn off, TTS consult for suicidal ideation and disposition [MP]    Clinical Course User Index [MP] [name], DO   Medical Decision Making Amount and/or Complexity of Data Reviewed Labs: ordered.  Risk Prescription drug management.   I, Rosana Berger, assumed care for this patient.  In brief 53 year old male history of significant alcohol abuse.  Patient here today intoxicated, ethanol level 420.  This level was drawn at 1320.  At this time, patient's alcohol level estimated at 260.  I have ordered the patient some p.o. phenobarbital given his significant alcohol use history.  Concerns for the patient to develop withdrawal.  Patient clinically sober.  Is appropriate for TTS evaluation.       Anders Simmonds T, DO 01/01/24 2127

## 2024-01-01 NOTE — Progress Notes (Signed)
 TTS consult ordered.

## 2024-01-01 NOTE — ED Notes (Signed)
 Patient ripped out IV line.

## 2024-01-01 NOTE — ED Notes (Signed)
 Pt being verbally aggressive towards staff and security.

## 2024-01-01 NOTE — ED Notes (Signed)
 Patient pulled IV, and started yelling at staff. Calling this nurse "a fucking cunt". Patient making gesters to this nurse while she had started his IV stating "don't you like these hands, babe" while placing right hand on this nurse's left arm. Patient keeps talking about having being a multi-millionaire being able to buy this hospital. Patient states "I am in the [employer]." Patient states "I can blow this place up". Security and provider at this bedside until patient calms back down.

## 2024-01-01 NOTE — ED Notes (Signed)
 I patient yelling at sitter and hits at sitter. Dr. Elayne Snare aware.

## 2024-01-02 DIAGNOSIS — Z9151 Personal history of suicidal behavior: Secondary | ICD-10-CM | POA: Diagnosis not present

## 2024-01-02 DIAGNOSIS — F1024 Alcohol dependence with alcohol-induced mood disorder: Secondary | ICD-10-CM | POA: Diagnosis present

## 2024-01-02 DIAGNOSIS — E876 Hypokalemia: Secondary | ICD-10-CM | POA: Diagnosis present

## 2024-01-02 DIAGNOSIS — F10239 Alcohol dependence with withdrawal, unspecified: Secondary | ICD-10-CM | POA: Diagnosis present

## 2024-01-02 DIAGNOSIS — F10220 Alcohol dependence with intoxication, uncomplicated: Secondary | ICD-10-CM | POA: Diagnosis present

## 2024-01-02 DIAGNOSIS — Z79899 Other long term (current) drug therapy: Secondary | ICD-10-CM | POA: Diagnosis not present

## 2024-01-02 DIAGNOSIS — F411 Generalized anxiety disorder: Secondary | ICD-10-CM | POA: Diagnosis present

## 2024-01-02 DIAGNOSIS — Z811 Family history of alcohol abuse and dependence: Secondary | ICD-10-CM | POA: Diagnosis not present

## 2024-01-02 DIAGNOSIS — Y908 Blood alcohol level of 240 mg/100 ml or more: Secondary | ICD-10-CM | POA: Diagnosis present

## 2024-01-02 DIAGNOSIS — Z91148 Patient's other noncompliance with medication regimen for other reason: Secondary | ICD-10-CM | POA: Diagnosis not present

## 2024-01-02 DIAGNOSIS — F4310 Post-traumatic stress disorder, unspecified: Secondary | ICD-10-CM | POA: Diagnosis present

## 2024-01-02 DIAGNOSIS — Z888 Allergy status to other drugs, medicaments and biological substances status: Secondary | ICD-10-CM | POA: Diagnosis not present

## 2024-01-02 DIAGNOSIS — R45851 Suicidal ideations: Secondary | ICD-10-CM | POA: Diagnosis present

## 2024-01-02 DIAGNOSIS — F17210 Nicotine dependence, cigarettes, uncomplicated: Secondary | ICD-10-CM | POA: Diagnosis present

## 2024-01-02 DIAGNOSIS — F3181 Bipolar II disorder: Secondary | ICD-10-CM | POA: Diagnosis present

## 2024-01-02 DIAGNOSIS — Z781 Physical restraint status: Secondary | ICD-10-CM | POA: Diagnosis not present

## 2024-01-02 LAB — COMPREHENSIVE METABOLIC PANEL WITH GFR
ALT: 34 U/L (ref 0–44)
AST: 51 U/L — ABNORMAL HIGH (ref 15–41)
Albumin: 3.5 g/dL (ref 3.5–5.0)
Alkaline Phosphatase: 53 U/L (ref 38–126)
Anion gap: 9 (ref 5–15)
BUN: 7 mg/dL (ref 6–20)
CO2: 27 mmol/L (ref 22–32)
Calcium: 8.5 mg/dL — ABNORMAL LOW (ref 8.9–10.3)
Chloride: 103 mmol/L (ref 98–111)
Creatinine, Ser: 0.71 mg/dL (ref 0.61–1.24)
GFR, Estimated: >60 mL/min (ref >60)
Glucose, Bld: 82 mg/dL (ref 70–99)
Potassium: 3.8 mmol/L (ref 3.5–5.1)
Sodium: 139 mmol/L (ref 135–145)
Total Bilirubin: 0.7 mg/dL (ref 0.0–1.2)
Total Protein: 6.5 g/dL (ref 6.5–8.1)

## 2024-01-02 LAB — CBC
HCT: 37.1 % — ABNORMAL LOW (ref 39.0–52.0)
Hemoglobin: 12.4 g/dL — ABNORMAL LOW (ref 13.0–17.0)
MCH: 27 pg (ref 26.0–34.0)
MCHC: 33.4 g/dL (ref 30.0–36.0)
MCV: 80.8 fL (ref 80.0–100.0)
Platelets: 204 10*3/uL (ref 150–400)
RBC: 4.59 MIL/uL (ref 4.22–5.81)
RDW: 14.7 % (ref 11.5–15.5)
WBC: 6 10*3/uL (ref 4.0–10.5)
nRBC: 0 % (ref 0.0–0.2)

## 2024-01-02 MED ORDER — ACETAMINOPHEN 325 MG PO TABS
650.0000 mg | ORAL_TABLET | Freq: Four times a day (QID) | ORAL | Status: DC | PRN
  Administered 2024-01-02 (×2): 650 mg via ORAL
  Filled 2024-01-02 (×2): qty 2

## 2024-01-02 MED ORDER — ACETAMINOPHEN 650 MG RE SUPP: 650.0000 mg | Freq: Four times a day (QID) | RECTAL | Status: DC | PRN

## 2024-01-02 MED ORDER — PHENOBARBITAL 32.4 MG PO TABS: 64.8000 mg | ORAL_TABLET | Freq: Three times a day (TID) | ORAL | Status: DC

## 2024-01-02 MED ORDER — SODIUM CHLORIDE 0.9% FLUSH
3.0000 mL | Freq: Two times a day (BID) | INTRAVENOUS | Status: DC
  Administered 2024-01-02 (×2): 3 mL via INTRAVENOUS

## 2024-01-02 MED ORDER — PHENOBARBITAL 32.4 MG PO TABS
97.2000 mg | ORAL_TABLET | Freq: Three times a day (TID) | ORAL | Status: DC
Start: 2024-01-02 — End: 2024-01-02
  Administered 2024-01-02 (×3): 97.2 mg via ORAL
  Filled 2024-01-02 (×3): qty 3

## 2024-01-02 MED ORDER — NICOTINE 21 MG/24HR TD PT24
21.0000 mg | MEDICATED_PATCH | Freq: Every day | TRANSDERMAL | Status: DC
  Administered 2024-01-02: 21 mg via TRANSDERMAL
  Filled 2024-01-02: qty 1

## 2024-01-02 MED ORDER — PHENOBARBITAL 32.4 MG PO TABS
32.4000 mg | ORAL_TABLET | Freq: Three times a day (TID) | ORAL | Status: DC
Start: 2024-01-06 — End: 2024-01-02

## 2024-01-02 MED ORDER — POTASSIUM CHLORIDE CRYS ER 20 MEQ PO TBCR
40.0000 meq | EXTENDED_RELEASE_TABLET | Freq: Once | ORAL | Status: AC
  Administered 2024-01-02: 40 meq via ORAL
  Filled 2024-01-02: qty 2

## 2024-01-02 MED ORDER — ONDANSETRON HCL 4 MG PO TABS
4.0000 mg | ORAL_TABLET | Freq: Four times a day (QID) | ORAL | Status: DC | PRN
Start: 2024-01-02 — End: 2024-01-02

## 2024-01-02 MED ORDER — LOPERAMIDE HCL 2 MG PO CAPS: 2.0000 mg | ORAL_CAPSULE | ORAL | Status: DC | PRN

## 2024-01-02 MED ORDER — ENOXAPARIN SODIUM 40 MG/0.4ML IJ SOSY: 40.0000 mg | PREFILLED_SYRINGE | INTRAMUSCULAR | Status: DC

## 2024-01-02 MED ORDER — GABAPENTIN 300 MG PO CAPS
300.0000 mg | ORAL_CAPSULE | Freq: Three times a day (TID) | ORAL | Status: DC
  Administered 2024-01-02: 300 mg via ORAL
  Filled 2024-01-02: qty 1

## 2024-01-02 MED ORDER — ONDANSETRON HCL 4 MG/2ML IJ SOLN
4.0000 mg | Freq: Four times a day (QID) | INTRAMUSCULAR | Status: DC | PRN
  Administered 2024-01-02 (×3): 4 mg via INTRAVENOUS
  Filled 2024-01-02 (×3): qty 2

## 2024-01-02 NOTE — Consult Note (Signed)
 [facility] Psychiatric Consult Initial  Patient Name: .[name]  MRN: [id number]  DOB: Mar 19, 1971  Consult Order details:  Orders (From admission, onward)     Start     Ordered   01/02/24 0834  IP CONSULT TO PSYCHIATRY       Ordering Provider: Meredeth Ide, MD  Provider:  (Not yet assigned)  Question Answer Comment  Location [facility]   Reason for Consult? Suicidal ideation      01/02/24 0833             Mode of Visit: In person    Psychiatry Consult Evaluation  Service Date: January 02, 2024 LOS:  LOS: 0 days  Chief Complaint "I guess I was drunk and got irate."  Primary Psychiatric Diagnoses  Alcohol withdrawal with complications Alcohol use d/o, severe, continuous use 3.   General anxiety disorder  Assessment  [name] is a 53 y.o. male admitted: Medicallyfor 01/01/2024 12:33 PM for alcohol detox/withdrawal. He carries the psychiatric diagnoses of alcohol use dependence severe, alcohol withdrawal with DTs  and has a past medical history of withdrawal seizures.   His current presentation of tremors, nausea, elevated BP/P, and intermittent visual hallucinations is most consistent with alcohol withdrawal. He meets criteria for alcohol use d/o based on multiple visits to the ED with high BAL levels, report of drinking over 100 beers a week, daily alcohol use effecting his functioning.  Current outpatient psychotropic medications include none and historically he has had a ineffective response to these medications. He was non compliant with medications prior to admission as evidenced by self-report. On initial examination, patient was cooperative, pleasant, restless, and seeing things crawling on the walls at times. Please see plan below for detailed recommendations.   Diagnoses:  Active Hospital problems: Principal Problem:   Alcohol dependence with withdrawal with complication (HCC) Active Problems:   Alcohol abuse with  alcohol-induced mood disorder (HCC)   Tobacco use   Alcohol withdrawal (HCC)   Hypokalemia    Plan   ## Psychiatric Medication Recommendations:  Continue CIWA with detox protocol Started gabapentin 300 mg TID to assist with w/d and anxiety "I won't take pills. I don't take anything."   ## Medical Decision Making Capacity: Not specifically addressed in this encounter  ## Further Work-up:  -- most recent EKG on 01/01/2024 had QtC of 466 -- Pertinent labwork reviewed earlier this admission includes: EKG, CBC and diff, and chem panel   ## Disposition:-- There are no psychiatric contraindications to discharge at this time  ## Behavioral / Environmental: -Delirium Precautions: Delirium Interventions for Nursing and Staff: - RN to open blinds every AM. - To Bedside: Glasses, hearing aide, and pt's own shoes. Make available to patients. when possible and encourage use. - Encourage po fluids when appropriate, keep fluids within reach. - OOB to chair with meals. - Passive ROM exercises to all extremities with AM & PM care. - RN to assess orientation to person, time and place QAM and PRN. - Recommend extended visitation hours with familiar family/friends as feasible. - Staff to minimize disturbances at night. Turn off television when pt asleep or when not in use.    ## Safety and Observation Level:  - Based on my clinical evaluation, I estimate the patient to be at low risk of self harm in the current setting. - At this time, we recommend  routine. This decision is based on my review of the chart including patient's history and current presentation,  interview of the patient, mental status examination, and consideration of suicide risk including evaluating suicidal ideation, plan, intent, suicidal or self-harm behaviors, risk factors, and protective factors. This judgment is based on our ability to directly address suicide risk, implement suicide prevention strategies, and develop a safety plan while  the patient is in the clinical setting. Please contact our team if there is a concern that risk level has changed.  CSSR Risk Category:C-SSRS RISK CATEGORY: Low Risk  Suicide Risk Assessment: Patient has following modifiable risk factors for suicide: medication noncompliance, which we are addressing by evaluation, therapeutic communication, encouragement for treatment. Patient has following non-modifiable or demographic risk factors for suicide: male gender, history of suicide attempt, and psychiatric hospitalization Patient has the following protective factors against suicide: Access to outpatient mental health care and Supportive friends  Thank you for this consult request. Recommendations have been communicated to the primary team.  We will sign off at this time.   Nanine Means, NP       History of Present Illness  Relevant Aspects of [facility] Course:  Admitted on 01/01/2024 for alcohol intoxication and making SI statements.   Patient Report:  Patient reports "I was drunk and irate" and this is why I was brought to the hospital.  He reports he drinks 80-100 beers of Miller Light bottles in a week. Last drink was the morning of 01/01/24. He reports a history of alcohol withdrawal seizures and DT's. He states " I drink because of anxiety".  Has a history of PTSD, which has had three years of CBT that he did not feel it helped. He reports he has mild depression. Denies SI/HI/SA. He reports visual hallucinations of spiders crawling on the wall that come and go. Smokes marijuana at bedtime to help with sleep.  Lives in an apartment and has neighbors who he socializes with him. 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 He enjoys fishing and golfing. Last fishing event was last week. He reported he did not want to take any medications, as gabapentin and venlafaxine did not help in the past. "I won't take pills.  I don't take anything."  He will only go to detox if affiliated through the [place], however, this made him more anxious due to being off work for 45 days and would need to figure his work schedule out.   Psych ROS:  Depression: mild Anxiety:  high Mania (lifetime and current): Denies Psychosis: (lifetime and current): Denies  Collateral information:  None  Review of Systems  Psychiatric/Behavioral:  Positive for depression, hallucinations and substance abuse. The patient is nervous/anxious.   All other systems reviewed and are negative.    Psychiatric and Social History  Psychiatric History:  Information collected from patient.   Prev Dx/Sx: Alcohol Abuse, Anxiety, PTSD, and Depression Current Psych Provider: Denies Home Meds (current): Denies  Previous Med Trials: Gabapentin, Venlafaxine, Paxil Therapy: Denies  Prior Psych Hospitalization: Multiple  ER/hospitalizations for PTSD, Alcohol intoxication, and SI.  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  Fairly well groomed, in paper scrubs and socks. Sitter at bedside for safety.  Bed padded on rails for safety.  Patient able to sit through the assessment and interview, answered questions appropriately, and had clear and logical answers/thoughts.  Vital Signs:   Temp:  [97 F (36.1 C)-98.9 F (37.2 C)] 98.9 F (37.2 C) (03/29 1200) Pulse Rate:  [86-137] 114 (03/29 1200) Resp:  [11-25] 17 (03/29 0700) BP: (101-143)/(65-104) 125/100 (03/29 1200) SpO2:  [88 %-100 %] 97 % (03/29 1200) Blood pressure (!) 125/100, pulse (!) 114, temperature 98.9 F (37.2 C), temperature source Oral, resp. rate 17, height 5\' 8"  (1.727 m), weight 73.5 kg, SpO2 97%. Body mass index is 24.63 kg/m.  Physical Exam Vitals and nursing note reviewed.  Constitutional:      Appearance: Normal appearance.  HENT:     Head: Normocephalic.     Right Ear: External ear normal.     Left Ear: External ear normal.     Nose: Nose normal.     Mouth/Throat:     Mouth: Mucous membranes are moist.     Pharynx: Oropharynx is clear.  Pulmonary:     Effort: Pulmonary effort is normal.  Abdominal:     General: Abdomen is flat.  Musculoskeletal:        General: Normal range of motion.     Cervical back: Normal range of motion.  Neurological:     General: No focal deficit present.     Mental Status: He is alert and oriented to person, place, and time.     Mental Status Exam: General Appearance: Casual  Orientation:  Full (Time, Place, and Person)  Memory:  Immediate;   Good Recent;   Good Remote;   Good  Concentration:  Concentration: Good and Attention Span: Good  Recall:  Good  Attention  Fair  Eye Contact:  Good  Speech:  Normal Rate  Language:  Good  Volume:  Normal  Mood: anxious, depression-mild  Affect:  Blunt  Thought Process:  Coherent  Thought Content:  Seeing things "crawling on the walls"  Suicidal Thoughts:  No  Homicidal Thoughts:  No  Judgement:  Fair  Insight:  Lacking  Psychomotor Activity:  Normal  Akathisia:  No  Fund of Knowledge:  Good      Assets:  Housing Leisure Time Physical Health Resilience Social Support  Cognition:  WNL  ADL's:  Intact  AIMS (if indicated):        Other History   These have been pulled in through the EMR,  reviewed, and updated if appropriate.  Family History:  The patient's family history includes Alcohol abuse in his father.  Medical History: Past Medical History:  Diagnosis Date   Active smoker    Alcohol abuse    Anxiety    Bipolar 2 disorder (HCC)    Depression    PTSD (post-traumatic stress disorder)     Surgical History: Past Surgical History:  Procedure Laterality Date   HERNIA REPAIR     LYMPH NODE DISSECTION       Medications:   Current Facility-Administered Medications:    acetaminophen (TYLENOL) tablet 650 mg, 650 mg, Oral, Q6H PRN, 650 mg at 01/02/24 0846 **OR** acetaminophen (TYLENOL) suppository 650 mg, 650  mg, Rectal, Q6H PRN, Allena Katz, Vishal R, MD   enoxaparin (LOVENOX) injection 40 mg, 40 mg, Subcutaneous, Q24H, Patel, Floreen Comber, MD   folic acid (FOLVITE) tablet 1 mg, 1 mg, Oral, Daily, Anders Simmonds T, DO, 1 mg at 01/02/24 4098   loperamide (IMODIUM) capsule 2 mg, 2 mg, Oral, PRN, Charlsie Quest, MD   LORazepam (ATIVAN) tablet 1-4 mg, 1-4 mg, Oral, Q1H PRN, 2 mg at 01/02/24 0018 **OR** LORazepam (ATIVAN) injection 1-4 mg, 1-4 mg, Intravenous, Q1H PRN, Anders Simmonds T, DO, 1 mg at 01/02/24 1355   multivitamin with minerals tablet 1 tablet, 1 tablet, Oral, Daily, Anders Simmonds T, DO, 1 tablet at 01/02/24 0944   nicotine (NICODERM CQ - dosed in mg/24 hours) patch 21 mg, 21 mg, Transdermal, Daily, Darreld Mclean R, MD, 21 mg at 01/02/24 0512   ondansetron (ZOFRAN) tablet 4 mg, 4 mg, Oral, Q6H PRN **OR** ondansetron (ZOFRAN) injection 4 mg, 4 mg, Intravenous, Q6H PRN, Charlsie Quest, MD, 4 mg at 01/02/24 1130   PHENobarbital (LUMINAL) tablet 97.2 mg, 97.2 mg, Oral, Q8H, 97.2 mg at 01/02/24 0943 **FOLLOWED BY** [START ON 01/04/2024] PHENobarbital (LUMINAL) tablet 64.8 mg, 64.8 mg, Oral, Q8H **FOLLOWED BY** [START ON 01/06/2024] PHENobarbital (LUMINAL) tablet 32.4 mg, 32.4 mg, Oral, Q8H, Patel, Vishal R, MD   sodium chloride flush (NS) 0.9 % injection 3 mL, 3 mL,  Intravenous, Q12H, Patel, Vishal R, MD, 3 mL at 01/02/24 0946   thiamine (VITAMIN B1) tablet 100 mg, 100 mg, Oral, Daily, 100 mg at 01/02/24 0944 **OR** thiamine (VITAMIN B1) injection 100 mg, 100 mg, Intravenous, Daily, Anders Simmonds T, DO  Allergies: Allergies  Allergen Reactions   Effexor [Venlafaxine] Hives   Paroxetine Hives   Paxil [Paroxetine Hcl] Hives and Rash    Nanine Means, NP

## 2024-01-02 NOTE — Progress Notes (Signed)
 Subjective: Patient admitted this morning, see detailed H&P by Dr Allena Katz male with medical history significant for severe alcohol use disorder with dependence and and hospitalizations for withdrawal, PTSD who presented to the ED reportedly intoxicated and making vague SI statements.   Vitals:   01/02/24 0600 01/02/24 0700  BP: 110/69 120/76  Pulse: 86 96  Resp: 17 17  Temp:    SpO2: 95% 96%      A/P  Severe alcohol use disorder with dependence and withdrawal: Patient reports drinking 80-100 beers daily.  He has developed withdrawal symptoms while holding in the ED.  Very high risk for severe withdrawal with prior history of DTs and withdrawal seizures. -Admit to stepdown unit -Start phenobarbital taper per pharmacy -Keep on CIWA protocol with Ativan as needed -Continue thiamine, folate, MVM -TOC consult   History of PTSD Passive suicidal ideation: Patient initially reported vague SI statements on arrival to ED.  At time of admission he denies any active or passive SI.  He is not on any mood stabilizing medications. Will consult psych today   Hypokalemia: Replete.   Tobacco/Marijuana use: Patient reports smoking > 1 pack/day.  Nicotine patch provided.  Also reports regular marijuana use, UDS positive for THC.   Meredeth Ide [facility]

## 2024-01-02 NOTE — Progress Notes (Signed)
 Pt asking to leave AMA. On assessment A&Ox4.Explained risks of leaving, pt understands the risks, however pt still wants to leave. Charge nurse and MD notified. IV removed and teli. Clothes are given and patient left the building.

## 2024-01-02 NOTE — H&P (Signed)
 History and Physical    [name] NWG:[id number] DOB: Mar 01, 1971 DOA: 01/01/2024  PCP: [facility]  Patient coming from: Home  I have personally briefly reviewed patient's old medical records in [facility] Health Link  Chief Complaint: Alcohol use disorder  HPI: [name] is a 53 y.o. male with medical history significant for severe alcohol use disorder with dependence and and hospitalizations for withdrawal, PTSD who presented to the ED reportedly intoxicated and making vague SI statements.  Patient presented to the ED afternoon of 3/28.  He was noted to be publicly intoxicated and brought by [employer].  He reportedly stated that "I am going to die tonight."  When asked how he was going to die he stated "from alcohol."  He became verbally aggressive during staff while in the ED and was pulling out his IV.  He temporarily required 4-point restraints to administer multiple medications as reported below.  Patient states that he drinks between 80-100 beers every day.  He says his last drink was morning of 3/28.  He reports that he begins to develop withdrawal symptoms very quickly when he stops drinking for short period of time, within a few hours.  He reports a prior history of alcohol withdrawal seizures.  Also has documented history of DTs.  He reports smoking more than 1 pack of cigarettes daily.  He also reports regular marijuana use.  At time of admission patient states that he does not have any suicidal or self-harm thoughts.  He denies any visual or auditory hallucinations.  ED Course  Labs/Imaging on admission: I have personally reviewed following labs and imaging studies.  Initial vitals showed BP 145/92, pulse 107, RR 18, temp 98.6 F, SpO2 97% on room air.  Labs showed sodium 134, potassium 3.4, bicarb 24, BUN 8, creatinine 0.65, serum glucose 113, AST 58, ALT 42, alk phos 62, total bilirubin 0.5, WBC 7.2, hemoglobin 14.8, platelets 234,000.  Serum  ethanol 420.  Serum salicylate and acetaminophen levels undetectable.  UDS positive for THC.  Patient initially holding in the ED for [facility] consult.  While in the ED patient was given 3 L IV fluids.  He developed withdrawal symptoms and agitation and was given IM droperidol 5 mg, IV Ativan 1 and 2 mg, IM Ativan 2 mg, phenobarbital 32.4 mg orally.  Patient became significantly agitated and could not be verbally redirected.  He required 4-point restraints.  Due to progression to severe alcohol withdrawal the hospitalist service was consulted to admit.  Review of Systems: All systems reviewed and are negative except as documented in history of present illness above.   Past Medical History:  Diagnosis Date   Active smoker    Alcohol abuse    Anxiety    Bipolar 2 disorder (HCC)    Depression    PTSD (post-traumatic stress disorder)     Past Surgical History:  Procedure Laterality Date   HERNIA REPAIR     LYMPH NODE DISSECTION      Social History: Patient reports drinking 80-100 beers daily.  He reports smoking more than 1 pack of cigarettes daily.  He reports regular marijuana use.  Allergies  Allergen Reactions   Effexor [Venlafaxine] Hives   Paroxetine Hives   Paxil [Paroxetine Hcl] Hives and Rash    Family History  Problem Relation Age of Onset   Alcohol abuse Father      Prior to Admission medications   Medication Sig Start Date End Date Taking? Authorizing Provider  gabapentin (NEURONTIN)  300 MG capsule Take 300 mg by mouth 3 (three) times daily. For alcohol cravings    [provider]  rosuvastatin (CRESTOR) 5 MG tablet Take 2.5 mg by mouth daily.    [provider]    Physical Exam: Vitals:   01/01/24 1845 01/01/24 1900 01/01/24 2233 01/02/24 0012  BP: (!) 124/104 (!) 132/99 128/75 128/75  Pulse: (!) 113  (!) 125 (!) 125  Resp: 18 17 (!) 25   Temp:      TempSrc:      SpO2:   100%   Weight:      Height:       Constitutional: Resting in bed,  somewhat hyperactive, gets up in middle in conversation to go use the bathroom before returning Eyes: EOMI, lids and conjunctivae normal ENMT: Mucous membranes are moist. Posterior pharynx clear of any exudate or lesions.Normal dentition.  Neck: normal, supple, no masses. Respiratory: clear to auscultation bilaterally, no wheezing, no crackles. Normal respiratory effort. No accessory muscle use.  Cardiovascular: Tachycardic, no murmurs / rubs / gallops. No extremity edema. 2+ pedal pulses. Abdomen: no tenderness, no masses palpated. Musculoskeletal: no clubbing / cyanosis. No joint deformity upper and lower extremities. Good ROM, no contractures. Normal muscle tone.  Skin: no rashes, lesions, ulcers. No induration Neurologic: Slight tremors.  Sensation intact. Strength 5/5 in all 4.  Psychiatric: Alert and oriented x 3.  Hyperactive mood.  Denies active suicidal ideation.  Denies auditory or visual hallucinations.  EKG: Personally reviewed. Sinus tachycardia, rate 120.  Assessment/Plan Principal Problem:   Alcohol dependence with withdrawal with complication [facility]) Active Problems:   Tobacco use   Hypokalemia   [name] is a 53 y.o. male with medical history significant for severe alcohol use disorder with dependence and and hospitalizations for withdrawal, PTSD who is admitted with alcohol withdrawal.  Assessment and Plan: Severe alcohol use disorder with dependence and withdrawal: Patient reports drinking 80-100 beers daily.  He has developed withdrawal symptoms while holding in the ED.  Very high risk for severe withdrawal with prior history of DTs and withdrawal seizures. -Admit to stepdown unit -Start phenobarbital taper per pharmacy -Keep on CIWA protocol with Ativan as needed -Continue thiamine, folate, MVM -TOC consult  History of PTSD Passive suicidal ideation: Patient initially reported vague SI statements on arrival to ED.  At time of admission he denies any  active or passive SI.  He is not on any mood stabilizing medications. -Psych consult once more medically stable  Hypokalemia: Replete.  Tobacco/Marijuana use: Patient reports smoking > 1 pack/day.  Nicotine patch provided.  Also reports regular marijuana use, UDS positive for THC.   DVT prophylaxis: enoxaparin (LOVENOX) injection 40 mg Start: 01/02/24 2200 Code Status: Full code Family Communication: Discussed with patient, he has discussed with family Disposition Plan: Pending clinical progress Consults called: Will need psychiatry consult Severity of Illness: The appropriate patient status for this patient is INPATIENT. Inpatient status is judged to be reasonable and necessary in order to provide the required intensity of service to ensure the patient's safety. The patient's presenting symptoms, physical exam findings, and initial radiographic and laboratory data in the context of their chronic comorbidities is felt to place them at high risk for further clinical deterioration. Furthermore, it is not anticipated that the patient will be medically stable for discharge from the hospital within 2 midnights of admission.   * I certify that at the point of admission it is my clinical judgment that the patient will  require inpatient hospital care spanning beyond 2 midnights from the point of admission due to high intensity of service, high risk for further deterioration and high frequency of surveillance required.Darreld Mclean MD [facility]  If 7PM-7AM, please contact night-coverage [email or web address]  01/02/2024, 1:30 AM

## 2024-01-02 NOTE — Hospital Course (Signed)
 [name] is a 54 y.o. male with medical history significant for severe alcohol use disorder with dependence and and hospitalizations for withdrawal, PTSD who is admitted with alcohol withdrawal.

## 2024-01-02 NOTE — ED Provider Notes (Signed)
 After IV fluids, IV lorazepam, IV phenobarbital, patient is still markedly tremulous and tachycardic.  He has history of severe alcohol withdrawal.  I believe he needs to be managed as an inpatient.  Have discussed the case with Dr. Ferd Glassing of [facility], who agrees to admit the patient.   Dione Booze, MD 01/02/24 [phone]

## 2024-01-02 NOTE — BH Assessment (Signed)
 Per Kavin Leech Harris, RN the pt is not medically cleared and will be admitted to the medical floor. Clinician reports, if the EDP puts in a psych consult the pt will be assessed by an NP on the medical floor.    Redmond Pulling, MS, [facility], [facility] Triage Specialist [phone]

## 2024-02-14 ENCOUNTER — Other Ambulatory Visit: Payer: Self-pay

## 2024-02-14 ENCOUNTER — Emergency Department (HOSPITAL_COMMUNITY)
Admission: EM | Admit: 2024-02-14 | Discharge: 2024-02-16 | Disposition: A | Discharge status: Other Acute Inpatient Hospital | Attending: Emergency Medicine | Admitting: Emergency Medicine

## 2024-02-14 ENCOUNTER — Ambulatory Visit (HOSPITAL_COMMUNITY): Admission: EM | Admit: 2024-02-14 | Discharge: 2024-02-14 | Discharge status: Against Medical Advice

## 2024-02-14 ENCOUNTER — Encounter (HOSPITAL_COMMUNITY): Payer: Self-pay | Admitting: *Deleted

## 2024-02-14 DIAGNOSIS — F1014 Alcohol abuse with alcohol-induced mood disorder: Secondary | ICD-10-CM | POA: Diagnosis present

## 2024-02-14 DIAGNOSIS — F17210 Nicotine dependence, cigarettes, uncomplicated: Secondary | ICD-10-CM | POA: Diagnosis not present

## 2024-02-14 DIAGNOSIS — F4310 Post-traumatic stress disorder, unspecified: Secondary | ICD-10-CM | POA: Diagnosis not present

## 2024-02-14 DIAGNOSIS — R45850 Homicidal ideations: Secondary | ICD-10-CM | POA: Insufficient documentation

## 2024-02-14 DIAGNOSIS — F10920 Alcohol use, unspecified with intoxication, uncomplicated: Secondary | ICD-10-CM

## 2024-02-14 DIAGNOSIS — F1020 Alcohol dependence, uncomplicated: Secondary | ICD-10-CM | POA: Diagnosis present

## 2024-02-14 LAB — CBC WITH DIFFERENTIAL/PLATELET
Abs Immature Granulocytes: 0.03 10*3/uL (ref 0.00–0.07)
Basophils Absolute: 0.1 10*3/uL (ref 0.0–0.1)
Basophils Relative: 1 %
Eosinophils Absolute: 0.1 10*3/uL (ref 0.0–0.5)
Eosinophils Relative: 1 %
HCT: 49.7 % (ref 39.0–52.0)
Hemoglobin: 16.2 g/dL (ref 13.0–17.0)
Immature Granulocytes: 0 %
Lymphocytes Relative: 47 %
Lymphs Abs: 4.9 10*3/uL — ABNORMAL HIGH (ref 0.7–4.0)
MCH: 26.9 pg (ref 26.0–34.0)
MCHC: 32.6 g/dL (ref 30.0–36.0)
MCV: 82.4 fL (ref 80.0–100.0)
Monocytes Absolute: 0.5 10*3/uL (ref 0.1–1.0)
Monocytes Relative: 5 %
Neutro Abs: 4.6 10*3/uL (ref 1.7–7.7)
Neutrophils Relative %: 46 %
Platelets: 298 10*3/uL (ref 150–400)
RBC: 6.03 MIL/uL — ABNORMAL HIGH (ref 4.22–5.81)
RDW: 15.3 % (ref 11.5–15.5)
WBC: 10.2 10*3/uL (ref 4.0–10.5)
nRBC: 0 % (ref 0.0–0.2)

## 2024-02-14 LAB — COMPREHENSIVE METABOLIC PANEL WITH GFR
ALT: 31 U/L (ref 0–44)
AST: 42 U/L — ABNORMAL HIGH (ref 15–41)
Albumin: 4.4 g/dL (ref 3.5–5.0)
Alkaline Phosphatase: 57 U/L (ref 38–126)
Anion gap: 12 (ref 5–15)
BUN: 11 mg/dL (ref 6–20)
CO2: 20 mmol/L — ABNORMAL LOW (ref 22–32)
Calcium: 8.5 mg/dL — ABNORMAL LOW (ref 8.9–10.3)
Chloride: 110 mmol/L (ref 98–111)
Creatinine, Ser: 0.93 mg/dL (ref 0.61–1.24)
GFR, Estimated: >60 mL/min (ref >60)
Glucose, Bld: 94 mg/dL (ref 70–99)
Potassium: 3.7 mmol/L (ref 3.5–5.1)
Sodium: 142 mmol/L (ref 135–145)
Total Bilirubin: 0.6 mg/dL (ref 0.0–1.2)
Total Protein: 8 g/dL (ref 6.5–8.1)

## 2024-02-14 LAB — RAPID URINE DRUG SCREEN, HOSP PERFORMED
Amphetamines: NOT DETECTED
Barbiturates: NOT DETECTED
Benzodiazepines: NOT DETECTED
Cocaine: NOT DETECTED
Opiates: NOT DETECTED
Tetrahydrocannabinol: POSITIVE — AB

## 2024-02-14 LAB — ETHANOL: Alcohol, Ethyl (B): 359 mg/dL (ref <15)

## 2024-02-14 MED ORDER — THIAMINE HCL 100 MG/ML IJ SOLN: 100.0000 mg | Freq: Every day | INTRAMUSCULAR | Status: DC

## 2024-02-14 MED ORDER — LORAZEPAM 2 MG/ML IJ SOLN
1.0000 mg | INTRAMUSCULAR | Status: DC | PRN
  Administered 2024-02-15: 2 mg via INTRAVENOUS
  Filled 2024-02-14: qty 1

## 2024-02-14 MED ORDER — LORAZEPAM 2 MG/ML IJ SOLN
1.0000 mg | Freq: Once | INTRAMUSCULAR | Status: AC
  Administered 2024-02-14: 1 mg via INTRAVENOUS
  Filled 2024-02-14: qty 1

## 2024-02-14 MED ORDER — FOLIC ACID 1 MG PO TABS
1.0000 mg | ORAL_TABLET | Freq: Every day | ORAL | Status: DC
  Administered 2024-02-15 – 2024-02-16 (×3): 1 mg via ORAL
  Filled 2024-02-14 (×3): qty 1

## 2024-02-14 MED ORDER — LORAZEPAM 1 MG PO TABS
1.0000 mg | ORAL_TABLET | ORAL | Status: DC | PRN
  Administered 2024-02-15 (×2): 2 mg via ORAL
  Administered 2024-02-15 (×3): 1 mg via ORAL
  Administered 2024-02-16: 2 mg via ORAL
  Filled 2024-02-14: qty 4
  Filled 2024-02-14: qty 2
  Filled 2024-02-14 (×3): qty 1
  Filled 2024-02-14: qty 2

## 2024-02-14 MED ORDER — ADULT MULTIVITAMIN W/MINERALS CH
1.0000 | ORAL_TABLET | Freq: Every day | ORAL | Status: DC
  Administered 2024-02-15 – 2024-02-16 (×3): 1 via ORAL
  Filled 2024-02-14 (×3): qty 1

## 2024-02-14 MED ORDER — DROPERIDOL 2.5 MG/ML IJ SOLN
2.5000 mg | Freq: Once | INTRAMUSCULAR | Status: AC
  Administered 2024-02-14: 2.5 mg via INTRAVENOUS
  Filled 2024-02-14: qty 2

## 2024-02-14 MED ORDER — THIAMINE MONONITRATE 100 MG PO TABS
100.0000 mg | ORAL_TABLET | Freq: Every day | ORAL | Status: DC
Start: 2024-02-14 — End: 2024-02-16
  Administered 2024-02-15 – 2024-02-16 (×3): 100 mg via ORAL
  Filled 2024-02-14 (×3): qty 1

## 2024-02-14 NOTE — ED Notes (Signed)
Patient left before triage. 
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
## 2024-02-14 NOTE — ED Notes (Signed)
 Patient changed out into hospital gown. 1 labeled patient belonging bag located at nurse station across from room 18.

## 2024-02-14 NOTE — BH Assessment (Signed)
 This patient has been referred to [facility] telecare for his teleassessment.  [place] with [facility] will reach out with a time and provider to see patient.
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
## 2024-02-14 NOTE — ED Notes (Signed)
 Placed patient on 2L [place] as patients oxygen saturation went to 88% room air.
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
## 2024-02-15 ENCOUNTER — Encounter (HOSPITAL_COMMUNITY): Payer: Self-pay | Admitting: Psychiatric/Mental Health

## 2024-02-15 LAB — SARS CORONAVIRUS 2 BY RT PCR: SARS Coronavirus 2 by RT PCR: NEGATIVE

## 2024-02-15 MED ORDER — GABAPENTIN 300 MG PO CAPS
300.0000 mg | ORAL_CAPSULE | Freq: Three times a day (TID) | ORAL | Status: DC
  Administered 2024-02-15 – 2024-02-16 (×4): 300 mg via ORAL
  Filled 2024-02-15 (×4): qty 1

## 2024-02-15 MED ORDER — ZIPRASIDONE MESYLATE 20 MG IM SOLR
20.0000 mg | Freq: Once | INTRAMUSCULAR | Status: DC
Start: 2024-02-15 — End: 2024-02-16
  Filled 2024-02-15: qty 20

## 2024-02-15 MED ORDER — NICOTINE 14 MG/24HR TD PT24
14.0000 mg | MEDICATED_PATCH | Freq: Every day | TRANSDERMAL | Status: DC
  Administered 2024-02-15: 14 mg via TRANSDERMAL
  Filled 2024-02-15 (×2): qty 1

## 2024-02-15 MED ORDER — STERILE WATER FOR INJECTION IJ SOLN
INTRAMUSCULAR | Status: AC
  Filled 2024-02-15: qty 10

## 2024-02-15 NOTE — ED Provider Notes (Signed)
 Emergency Medicine Observation Re-evaluation Note  [name] is a 53 y.o. male, seen on rounds today.  Pt initially presented to the ED for complaints of Post-Traumatic Stress Disorder Currently, the patient is resting.  Physical Exam  BP 95/64   Pulse 84   Temp 98.2 F (36.8 C) (Oral)   Resp 17   Ht 5\' 8"  (1.727 m)   Wt 74 kg   SpO2 94%   BMI 24.81 kg/m  Physical Exam General: NAD   ED Course / MDM  EKG:EKG Interpretation Date/Time:  Sunday Feb 14 2024 17:16:54 EDT Ventricular Rate:  109 PR Interval:  126 QRS Duration:  105 QT Interval:  335 QTC Calculation: 452 R Axis:   -3  Text Interpretation: Sinus tachycardia Low voltage, precordial leads ST elev, probable normal early repol pattern When compared with ECG of 01/01/2024, No significant change was found Confirmed by Alissa April ([id number]) on 02/15/2024 2:16:16 AM  I have reviewed the labs performed to date as well as medications administered while in observation.  Recent changes in the last 24 hours include agitation.  Plan  Current plan is for TTS evaluation.    Burnette Carte, MD 02/15/24 [phone]

## 2024-02-15 NOTE — Progress Notes (Signed)
 TOC consulted for substance abuse resources. Resources attached to AVS. Pt is under TTS care at this time. No additional TOC needs. TOC signing off.

## 2024-02-15 NOTE — ED Notes (Signed)
 Pt requested to make a phone call and approved by RN, pt's son was called at [phone].

## 2024-02-15 NOTE — Progress Notes (Signed)
 LCSW Progress Note:   MRN: [id number]  [name]. Zill "[name]"  02/15/2024 7:41 PM   Arvell Latin, NP, recommends inpatient psychiatric hospitalization following medical stabilization. According to [facility] House Supervisor [place], [place], the patient is a potential candidate for admission to [facility]. However, [facility] is currently at capacity. The oncoming House Supervisor, Loetta Ringer, will be added to assess the patient for behavioral health needs. If appropriate, the patient can be admitted to [facility] tomorrow, 5/13. We are currently awaiting further updates regarding admission details

## 2024-02-15 NOTE — ED Notes (Signed)
 Patient has been alert this shift. Patient has been anxious, asking why he is still here in hospital, patient reassured and process was  explained.  Patient medication compliant.

## 2024-02-15 NOTE — BH Assessment (Addendum)
 RN Abby said that patient has been up and down through the night and has been agitated.  He has a CIWA of 14 and was given Ativan  around 06:00.  This patient will be seen by daytime provider.
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
## 2024-02-16 ENCOUNTER — Other Ambulatory Visit (HOSPITAL_COMMUNITY): Payer: Self-pay

## 2024-02-16 DIAGNOSIS — F4310 Post-traumatic stress disorder, unspecified: Secondary | ICD-10-CM

## 2024-02-16 DIAGNOSIS — F1020 Alcohol dependence, uncomplicated: Secondary | ICD-10-CM

## 2024-02-16 DIAGNOSIS — F1220 Cannabis dependence, uncomplicated: Secondary | ICD-10-CM

## 2024-02-16 MED ORDER — NICOTINE 21 MG/24HR TD PT24
21.0000 mg | MEDICATED_PATCH | Freq: Once | TRANSDERMAL | Status: DC
  Administered 2024-02-16: 21 mg via TRANSDERMAL
  Filled 2024-02-16: qty 1

## 2024-02-16 MED ORDER — VITAMIN B-1 100 MG PO TABS
100.0000 mg | ORAL_TABLET | Freq: Every day | ORAL | 0 refills | Status: AC
  Filled 2024-02-16: qty 5, 5d supply, fill #0

## 2024-02-16 MED ORDER — FOLIC ACID 1 MG PO TABS
1.0000 mg | ORAL_TABLET | Freq: Every day | ORAL | 0 refills | Status: AC
  Filled 2024-02-16: qty 30, 30d supply, fill #0

## 2024-02-16 MED ORDER — NICOTINE 14 MG/24HR TD PT24
14.0000 mg | MEDICATED_PATCH | Freq: Every day | TRANSDERMAL | 0 refills | Status: DC
  Filled 2024-02-16: qty 28, 28d supply, fill #0

## 2024-02-16 MED ORDER — ADULT MULTIVITAMIN W/MINERALS CH
1.0000 | ORAL_TABLET | Freq: Every day | ORAL | 0 refills | Status: AC
  Filled 2024-02-16: qty 30, 30d supply, fill #0

## 2024-02-16 MED ORDER — GABAPENTIN 300 MG PO CAPS
300.0000 mg | ORAL_CAPSULE | Freq: Three times a day (TID) | ORAL | 0 refills | Status: DC
Start: 2024-02-16 — End: 2024-03-19
  Filled 2024-02-16: qty 90, 30d supply, fill #0

## 2024-02-16 NOTE — ED Notes (Signed)
Pt currently in shower.

## 2024-02-16 NOTE — ED Notes (Signed)
 Pt awake asked about status and meds, educated pt about both. Pt now using bathroom.

## 2024-02-16 NOTE — ED Provider Notes (Signed)
 Emergency Medicine Observation Re-evaluation Note  [name] is a 52 y.o. male, seen on rounds today.  Pt initially presented to the ED for complaints of Post-Traumatic Stress Disorder Currently, the patient is awaiting completion of psychiatric evaluation.  Physical Exam  BP 107/74 (BP Location: Left Arm)   Pulse 71   Temp 98.2 F (36.8 C) (Oral)   Resp 18   Ht 1.727 m (5\' 8" )   Wt 74 kg   SpO2 98%   BMI 24.81 kg/m  Physical Exam General: Well-developed well-nourished male walking in the hallway does not appear to be a acute distress Cardiac: Normal heart rate, normal blood pressure Lungs: Normal respiratory rate and oxygen saturation Psych: Patient is interactive and ambulatory  ED Course / MDM  EKG:EKG Interpretation Date/Time:  Sunday Feb 14 2024 17:16:54 EDT Ventricular Rate:  109 PR Interval:  126 QRS Duration:  105 QT Interval:  335 QTC Calculation: 452 R Axis:   -3  Text Interpretation: Sinus tachycardia Low voltage, precordial leads ST elev, probable normal early repol pattern When compared with ECG of 01/01/2024, No significant change was found Confirmed by Alissa April ([id number]) on 02/15/2024 2:16:16 AM  I have reviewed the labs performed to date as well as medications administered while in observation.  Recent changes in the last 24 hours include patient is being seen by psychiatry.  Plan  Current plan is for waiting psychiatry recommendations. Psych has seen patient and rescinded IVC and reports that patient is stable from psychiatric perspective for discharge.   Auston Blush, MD 02/16/24 [phone]

## 2024-02-16 NOTE — ED Notes (Signed)
Provided pt with coffee.

## 2024-02-16 NOTE — ED Notes (Signed)
 [facility] called [place] to inquire about setting an appointment for pt to admitted to their Intensive Outpatient Program. The [facility] offers in-person services, virtual services or a combination of both. The [place] clinic took pts information and a Child psychotherapist from their office will be reaching out to pt after his discharge from the [facility] ED. [facility] spoke with pt about the IOP and encouraged him to follow through with the program. [facility] also instructed pt to reach out to their office in the event he does not hear back from them very soon. Pt said that he would follow through.   Patt Boozer, [facility]
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
## 2024-02-16 NOTE — Discharge Instructions (Signed)
Please follow up as discussed with psychiatry.

## 2024-02-18 ENCOUNTER — Other Ambulatory Visit (HOSPITAL_COMMUNITY): Payer: Self-pay

## 2024-02-26 ENCOUNTER — Other Ambulatory Visit (HOSPITAL_COMMUNITY): Payer: Self-pay

## 2024-03-18 ENCOUNTER — Emergency Department (HOSPITAL_COMMUNITY)
Admission: EM | Admit: 2024-03-18 | Discharge: 2024-03-19 | Disposition: A | Discharge status: Other Acute Inpatient Hospital | Attending: Emergency Medicine | Admitting: Emergency Medicine

## 2024-03-18 ENCOUNTER — Encounter (HOSPITAL_COMMUNITY): Payer: Self-pay | Admitting: Emergency Medicine

## 2024-03-18 ENCOUNTER — Emergency Department (HOSPITAL_COMMUNITY)
Admission: EM | Admit: 2024-03-18 | Discharge: 2024-03-18 | Discharge status: Against Medical Advice | Attending: Emergency Medicine | Admitting: Emergency Medicine

## 2024-03-18 DIAGNOSIS — F319 Bipolar disorder, unspecified: Secondary | ICD-10-CM | POA: Insufficient documentation

## 2024-03-18 DIAGNOSIS — F1020 Alcohol dependence, uncomplicated: Secondary | ICD-10-CM | POA: Diagnosis not present

## 2024-03-18 DIAGNOSIS — Z5321 Procedure and treatment not carried out due to patient leaving prior to being seen by health care provider: Secondary | ICD-10-CM | POA: Diagnosis present

## 2024-03-18 DIAGNOSIS — F17200 Nicotine dependence, unspecified, uncomplicated: Secondary | ICD-10-CM | POA: Insufficient documentation

## 2024-03-18 DIAGNOSIS — F4312 Post-traumatic stress disorder, chronic: Secondary | ICD-10-CM | POA: Insufficient documentation

## 2024-03-18 DIAGNOSIS — R7401 Elevation of levels of liver transaminase levels: Secondary | ICD-10-CM | POA: Insufficient documentation

## 2024-03-18 DIAGNOSIS — F4389 Other reactions to severe stress: Secondary | ICD-10-CM

## 2024-03-18 DIAGNOSIS — Y906 Blood alcohol level of 120-199 mg/100 ml: Secondary | ICD-10-CM | POA: Insufficient documentation

## 2024-03-18 DIAGNOSIS — F10239 Alcohol dependence with withdrawal, unspecified: Secondary | ICD-10-CM | POA: Diagnosis present

## 2024-03-18 DIAGNOSIS — F1010 Alcohol abuse, uncomplicated: Secondary | ICD-10-CM

## 2024-03-18 DIAGNOSIS — F3160 Bipolar disorder, current episode mixed, unspecified: Secondary | ICD-10-CM

## 2024-03-18 DIAGNOSIS — F10259 Alcohol dependence with alcohol-induced psychotic disorder, unspecified: Secondary | ICD-10-CM | POA: Insufficient documentation

## 2024-03-18 LAB — RAPID URINE DRUG SCREEN, HOSP PERFORMED
Amphetamines: NOT DETECTED
Barbiturates: NOT DETECTED
Benzodiazepines: NOT DETECTED
Cocaine: NOT DETECTED
Opiates: NOT DETECTED
Tetrahydrocannabinol: POSITIVE — AB

## 2024-03-18 LAB — CBC WITH DIFFERENTIAL/PLATELET
Abs Immature Granulocytes: 0.01 10*3/uL (ref 0.00–0.07)
Basophils Absolute: 0 10*3/uL (ref 0.0–0.1)
Basophils Relative: 0 %
Eosinophils Absolute: 0.1 10*3/uL (ref 0.0–0.5)
Eosinophils Relative: 1 %
HCT: 48.2 % (ref 39.0–52.0)
Hemoglobin: 16.4 g/dL (ref 13.0–17.0)
Immature Granulocytes: 0 %
Lymphocytes Relative: 51 %
Lymphs Abs: 3.7 10*3/uL (ref 0.7–4.0)
MCH: 27.1 pg (ref 26.0–34.0)
MCHC: 34 g/dL (ref 30.0–36.0)
MCV: 79.7 fL — ABNORMAL LOW (ref 80.0–100.0)
Monocytes Absolute: 0.6 10*3/uL (ref 0.1–1.0)
Monocytes Relative: 8 %
Neutro Abs: 2.9 10*3/uL (ref 1.7–7.7)
Neutrophils Relative %: 40 %
Platelets: 248 10*3/uL (ref 150–400)
RBC: 6.05 MIL/uL — ABNORMAL HIGH (ref 4.22–5.81)
RDW: 14.8 % (ref 11.5–15.5)
WBC: 7.3 10*3/uL (ref 4.0–10.5)
nRBC: 0 % (ref 0.0–0.2)

## 2024-03-18 LAB — ETHANOL: Alcohol, Ethyl (B): 195 mg/dL — ABNORMAL HIGH (ref <15)

## 2024-03-18 MED ORDER — ZIPRASIDONE MESYLATE 20 MG IM SOLR: 20.0000 mg | Freq: Once | INTRAMUSCULAR | Status: AC

## 2024-03-18 MED ORDER — ZIPRASIDONE MESYLATE 20 MG IM SOLR
INTRAMUSCULAR | Status: AC
  Administered 2024-03-18: 20 mg via INTRAMUSCULAR
  Filled 2024-03-18: qty 20

## 2024-03-18 MED ORDER — STERILE WATER FOR INJECTION IJ SOLN
INTRAMUSCULAR | Status: AC
  Filled 2024-03-18: qty 10

## 2024-03-18 MED ORDER — LORAZEPAM 1 MG PO TABS
1.0000 mg | ORAL_TABLET | Freq: Once | ORAL | Status: AC
  Administered 2024-03-18: 1 mg via ORAL
  Filled 2024-03-18: qty 1

## 2024-03-18 NOTE — ED Triage Notes (Addendum)
 Pt is sobbing and stating he can't breath. States he doesn't deserve to live but denies HI or SI. 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 Smells of ETOH. PT states son brought him. He keeps repeating that he is a mess and crying.

## 2024-03-18 NOTE — ED Notes (Signed)
 Patient got very anxious and restless after receiving patient in TCU. Patient stated he wants to go home; I can't sleep here in this hole. P.A. Informed. Geodon  given. Will continue to monitor.
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
## 2024-03-18 NOTE — ED Notes (Signed)
 Patient belonging bag to locker 31.

## 2024-03-19 ENCOUNTER — Encounter (HOSPITAL_COMMUNITY): Payer: Self-pay | Admitting: Psychiatry

## 2024-03-19 ENCOUNTER — Inpatient Hospital Stay (HOSPITAL_COMMUNITY)
Admission: AD | Admit: 2024-03-19 | Discharge: 2024-03-22 | DRG: 882 | Disposition: A | Discharge status: Home / Self Care | Source: Intra-hospital | Attending: Psychiatry | Admitting: Psychiatry

## 2024-03-19 ENCOUNTER — Inpatient Hospital Stay (HOSPITAL_COMMUNITY): Admit: 2024-03-19 | Admitting: Psychiatry

## 2024-03-19 ENCOUNTER — Other Ambulatory Visit: Payer: Self-pay

## 2024-03-19 ENCOUNTER — Encounter (HOSPITAL_COMMUNITY): Payer: Self-pay | Admitting: Nurse Practitioner

## 2024-03-19 DIAGNOSIS — F4389 Other reactions to severe stress: Secondary | ICD-10-CM | POA: Diagnosis not present

## 2024-03-19 DIAGNOSIS — F10239 Alcohol dependence with withdrawal, unspecified: Secondary | ICD-10-CM | POA: Diagnosis present

## 2024-03-19 DIAGNOSIS — F1020 Alcohol dependence, uncomplicated: Secondary | ICD-10-CM

## 2024-03-19 DIAGNOSIS — F319 Bipolar disorder, unspecified: Secondary | ICD-10-CM

## 2024-03-19 DIAGNOSIS — Z79899 Other long term (current) drug therapy: Secondary | ICD-10-CM

## 2024-03-19 DIAGNOSIS — F1290 Cannabis use, unspecified, uncomplicated: Secondary | ICD-10-CM | POA: Diagnosis present

## 2024-03-19 DIAGNOSIS — F3160 Bipolar disorder, current episode mixed, unspecified: Secondary | ICD-10-CM

## 2024-03-19 DIAGNOSIS — Z888 Allergy status to other drugs, medicaments and biological substances status: Secondary | ICD-10-CM | POA: Diagnosis not present

## 2024-03-19 DIAGNOSIS — F4312 Post-traumatic stress disorder, chronic: Secondary | ICD-10-CM | POA: Diagnosis not present

## 2024-03-19 DIAGNOSIS — F10259 Alcohol dependence with alcohol-induced psychotic disorder, unspecified: Secondary | ICD-10-CM | POA: Diagnosis not present

## 2024-03-19 DIAGNOSIS — Z811 Family history of alcohol abuse and dependence: Secondary | ICD-10-CM

## 2024-03-19 DIAGNOSIS — F419 Anxiety disorder, unspecified: Secondary | ICD-10-CM | POA: Diagnosis present

## 2024-03-19 DIAGNOSIS — F17210 Nicotine dependence, cigarettes, uncomplicated: Secondary | ICD-10-CM | POA: Diagnosis present

## 2024-03-19 DIAGNOSIS — F4310 Post-traumatic stress disorder, unspecified: Principal | ICD-10-CM | POA: Diagnosis present

## 2024-03-19 LAB — COMPREHENSIVE METABOLIC PANEL WITH GFR
ALT: 55 U/L — ABNORMAL HIGH (ref 0–44)
AST: 65 U/L — ABNORMAL HIGH (ref 15–41)
Albumin: 4.6 g/dL (ref 3.5–5.0)
Alkaline Phosphatase: 68 U/L (ref 38–126)
Anion gap: 19 — ABNORMAL HIGH (ref 5–15)
BUN: 12 mg/dL (ref 6–20)
CO2: 20 mmol/L — ABNORMAL LOW (ref 22–32)
Calcium: 8.8 mg/dL — ABNORMAL LOW (ref 8.9–10.3)
Chloride: 103 mmol/L (ref 98–111)
Creatinine, Ser: 0.74 mg/dL (ref 0.61–1.24)
GFR, Estimated: >60 mL/min (ref >60)
Glucose, Bld: 104 mg/dL — ABNORMAL HIGH (ref 70–99)
Potassium: 3.6 mmol/L (ref 3.5–5.1)
Sodium: 142 mmol/L (ref 135–145)
Total Bilirubin: 0.8 mg/dL (ref 0.0–1.2)
Total Protein: 8.4 g/dL — ABNORMAL HIGH (ref 6.5–8.1)

## 2024-03-19 MED ORDER — HALOPERIDOL LACTATE 5 MG/ML IJ SOLN: 10.0000 mg | Freq: Three times a day (TID) | INTRAMUSCULAR | Status: DC | PRN

## 2024-03-19 MED ORDER — THIAMINE HCL 100 MG/ML IJ SOLN: 100.0000 mg | Freq: Every day | INTRAMUSCULAR | Status: DC

## 2024-03-19 MED ORDER — GABAPENTIN 300 MG PO CAPS
300.0000 mg | ORAL_CAPSULE | Freq: Three times a day (TID) | ORAL | Status: DC
  Administered 2024-03-19 – 2024-03-21 (×7): 300 mg via ORAL
  Filled 2024-03-19 (×8): qty 1

## 2024-03-19 MED ORDER — LORAZEPAM 1 MG PO TABS
0.0000 mg | ORAL_TABLET | Freq: Two times a day (BID) | ORAL | Status: DC
  Filled 2024-03-19: qty 1

## 2024-03-19 MED ORDER — LORAZEPAM 1 MG PO TABS
0.0000 mg | ORAL_TABLET | Freq: Four times a day (QID) | ORAL | Status: AC
  Administered 2024-03-19: 3 mg via ORAL
  Administered 2024-03-20 (×3): 1 mg via ORAL
  Filled 2024-03-19 (×2): qty 1
  Filled 2024-03-19: qty 3
  Filled 2024-03-19: qty 1

## 2024-03-19 MED ORDER — NICOTINE POLACRILEX 2 MG MT GUM
2.0000 mg | CHEWING_GUM | OROMUCOSAL | Status: DC | PRN
  Administered 2024-03-19 – 2024-03-22 (×18): 2 mg via ORAL
  Filled 2024-03-19 (×7): qty 1

## 2024-03-19 MED ORDER — LORAZEPAM 1 MG PO TABS: 0.0000 mg | ORAL_TABLET | Freq: Two times a day (BID) | ORAL | Status: DC

## 2024-03-19 MED ORDER — DIPHENHYDRAMINE HCL 50 MG/ML IJ SOLN: 50.0000 mg | Freq: Three times a day (TID) | INTRAMUSCULAR | Status: DC | PRN

## 2024-03-19 MED ORDER — TRAZODONE HCL 50 MG PO TABS
50.0000 mg | ORAL_TABLET | Freq: Every evening | ORAL | Status: DC | PRN
  Administered 2024-03-19: 50 mg via ORAL
  Filled 2024-03-19 (×2): qty 1

## 2024-03-19 MED ORDER — NICOTINE 14 MG/24HR TD PT24
14.0000 mg | MEDICATED_PATCH | Freq: Every day | TRANSDERMAL | Status: DC
  Filled 2024-03-19 (×3): qty 1

## 2024-03-19 MED ORDER — NICOTINE POLACRILEX 2 MG MT GUM
2.0000 mg | CHEWING_GUM | OROMUCOSAL | Status: DC | PRN
  Administered 2024-03-19: 2 mg via ORAL
  Filled 2024-03-19: qty 1

## 2024-03-19 MED ORDER — LORAZEPAM 1 MG PO TABS
3.0000 mg | ORAL_TABLET | Freq: Once | ORAL | Status: AC
  Administered 2024-03-19: 3 mg via ORAL
  Filled 2024-03-19: qty 3

## 2024-03-19 MED ORDER — LORAZEPAM 2 MG/ML IJ SOLN: 2.0000 mg | Freq: Three times a day (TID) | INTRAMUSCULAR | Status: DC | PRN

## 2024-03-19 MED ORDER — ADULT MULTIVITAMIN W/MINERALS CH
1.0000 | ORAL_TABLET | Freq: Every day | ORAL | Status: DC
  Administered 2024-03-19 – 2024-03-22 (×4): 1 via ORAL
  Filled 2024-03-19 (×4): qty 1

## 2024-03-19 MED ORDER — LORAZEPAM 2 MG/ML IJ SOLN: 0.0000 mg | Freq: Four times a day (QID) | INTRAMUSCULAR | Status: DC

## 2024-03-19 MED ORDER — LORAZEPAM 1 MG PO TABS: 1.0000 mg | ORAL_TABLET | Freq: Four times a day (QID) | ORAL | Status: DC | PRN

## 2024-03-19 MED ORDER — DIPHENHYDRAMINE HCL 25 MG PO CAPS: 50.0000 mg | ORAL_CAPSULE | Freq: Three times a day (TID) | ORAL | Status: DC | PRN

## 2024-03-19 MED ORDER — LORAZEPAM 2 MG/ML IJ SOLN: 0.0000 mg | Freq: Two times a day (BID) | INTRAMUSCULAR | Status: DC

## 2024-03-19 MED ORDER — LORAZEPAM 1 MG PO TABS: 2.0000 mg | ORAL_TABLET | Freq: Once | ORAL | Status: DC

## 2024-03-19 MED ORDER — ALUM & MAG HYDROXIDE-SIMETH 200-200-20 MG/5ML PO SUSP: 30.0000 mL | ORAL | Status: DC | PRN

## 2024-03-19 MED ORDER — ACETAMINOPHEN 325 MG PO TABS
650.0000 mg | ORAL_TABLET | Freq: Four times a day (QID) | ORAL | Status: DC | PRN
  Administered 2024-03-19: 650 mg via ORAL
  Filled 2024-03-19: qty 2

## 2024-03-19 MED ORDER — VITAMIN B-1 100 MG PO TABS
100.0000 mg | ORAL_TABLET | Freq: Every day | ORAL | Status: DC
  Administered 2024-03-20 – 2024-03-22 (×3): 100 mg via ORAL
  Filled 2024-03-19 (×3): qty 1

## 2024-03-19 MED ORDER — HALOPERIDOL LACTATE 5 MG/ML IJ SOLN: 5.0000 mg | Freq: Three times a day (TID) | INTRAMUSCULAR | Status: DC | PRN

## 2024-03-19 MED ORDER — LOPERAMIDE HCL 2 MG PO CAPS: 2.0000 mg | ORAL_CAPSULE | ORAL | Status: DC | PRN

## 2024-03-19 MED ORDER — THIAMINE MONONITRATE 100 MG PO TABS
100.0000 mg | ORAL_TABLET | Freq: Every day | ORAL | Status: DC
  Administered 2024-03-19: 100 mg via ORAL

## 2024-03-19 MED ORDER — VITAMIN B-1 100 MG PO TABS: 100.0000 mg | ORAL_TABLET | Freq: Every day | ORAL | Status: DC

## 2024-03-19 MED ORDER — LORAZEPAM 1 MG PO TABS
0.0000 mg | ORAL_TABLET | Freq: Four times a day (QID) | ORAL | Status: DC
  Administered 2024-03-19 (×3): 1 mg via ORAL
  Filled 2024-03-19 (×3): qty 1

## 2024-03-19 MED ORDER — ONDANSETRON 4 MG PO TBDP: 4.0000 mg | ORAL_TABLET | Freq: Four times a day (QID) | ORAL | Status: DC | PRN

## 2024-03-19 MED ORDER — HALOPERIDOL 5 MG PO TABS: 5.0000 mg | ORAL_TABLET | Freq: Three times a day (TID) | ORAL | Status: DC | PRN

## 2024-03-19 MED ORDER — HYDROXYZINE HCL 25 MG PO TABS
25.0000 mg | ORAL_TABLET | Freq: Four times a day (QID) | ORAL | Status: DC | PRN
  Administered 2024-03-19 – 2024-03-21 (×2): 25 mg via ORAL
  Filled 2024-03-19 (×2): qty 1

## 2024-03-19 NOTE — BH Assessment (Signed)
 Per Junior, RN, pt is unable to engage in assessment due to receiving Geodon  earlier and pt is sedated.

## 2024-03-19 NOTE — Plan of Care (Signed)
  Problem: Education: Goal: Knowledge of [facility] Education information/materials will improve 03/19/2024 2011 by Sherlie Distance, RN Outcome: Progressing 03/19/2024 1929 by Sherlie Distance, RN Outcome: Progressing   Problem: Safety: Goal: Periods of time without injury will increase Outcome: Progressing

## 2024-03-19 NOTE — Plan of Care (Signed)
   Problem: Education: Goal: Knowledge of [facility] Education information/materials will improve Outcome: Progressing   Problem: Safety: Goal: Periods of time without injury will increase Outcome: Progressing

## 2024-03-19 NOTE — [facility] Group Notes (Signed)
 [facility] Group Notes:  (Nursing/MHT/Case Management/Adjunct)  Date:  03/19/2024  Time:  10:57 PM  Type of Therapy:  Psychoeducational Skills  Participation Level:  Active  Participation Quality:  Attentive  Affect:  Flat  Cognitive:  Appropriate  Insight:  Appropriate  Engagement in Group:  Developing/Improving  Modes of Intervention:  Education  Summary of Progress/Problems: The patient's goal for today was to meet with his peers. He rated his day as a 5 out of a possible 10. His goal for tomorrow is to listen more to his peers.   Arron Tetrault S 03/19/2024, 10:57 PM

## 2024-03-19 NOTE — Progress Notes (Signed)
TOC consulted for substance abuse resources. Resources provided and attached to AVS. No further TOC needs. TOC signing off.
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
## 2024-03-19 NOTE — ED Notes (Signed)
 Report given to [facility] RN and Safe Transport called

## 2024-03-19 NOTE — BH Assessment (Signed)
 Patient was deferred to [facility] for a telepsych assessment. The assigned care coordinator will provide updates regarding the scheduling of the assessment. [facility] care coordinator can be reached at [phone] for further information on the timing of the telepsych evaluation.

## 2024-03-19 NOTE — Consult Note (Signed)
 [name] Psychiatric Consult Initial  Patient Name: .[name]  MRN: [id number]  DOB: [birth year]  Consult Order details:  Orders (From admission, onward)     Start     Ordered   03/19/24 0029  CONSULT TO CALL ACT TEAM       Ordering Provider: Coretha Dew, PA-C  Provider:  (Not yet assigned)  Question:  Reason for Consult?  Answer:  Psych consult   03/19/24 0028             Mode of Visit: In person    Psychiatry Consult Evaluation  Service Date: March 19, 2024 LOS:  LOS: 0 days  Chief Complaint Alcohol  withdrawal, Alcohol  intoxication   Primary Psychiatric Diagnoses  Alcohol  use disorder severe dependence 2.  Bipolar disorder 3.  Chronic PTSD  Assessment  [name] is a 53 y.o. male admitted: Presented to the [facility] 03/18/2024  9:37 PM for Alcohol  withdrawal, Alcohol  intoxication. He carries the psychiatric diagnoses of Alcohol  use disorder/dependence severe, alcohol  withdrawal with DTs  and has a past medical history of withdrawal seizure.    His current presentation of Alcohol  intoxication, shakes and tremors is most consistent with Continuing use of Alcohol . He meets criteria for inpatient Psychiatry hospitalization for safe detox treatment  based on his withdrawal symptoms-shakes, tremors of hands and restlessness getting in and out of his bed..  Current outpatient psychotropic medications include none and historically he has had a unknown response to these medications. He was not compliant with medications prior to admission as evidenced by his reports.  On initial examination, patient was restless moving from bed to chair and back in bed and unable to stay still. Please see plan below for detailed recommendations.   Diagnoses:  Active Hospital problems: Principal Problem:   Alcohol  dependence with withdrawal with complication [name]) Active Problems:   Bipolar affective disorder, current episode mixed (HCC)   Other reactions to severe stress     Plan   ## Psychiatric Medication Recommendations:  Resume home Medications Continue CIWA Protocol using Ativan  coverage ## Medical Decision Making Capacity: Not specifically addressed in this encounter  ## Further Work-up -- most recent EKG on 02/14/2024 had QtC of 452 -- Pertinent labwork reviewed earlier this admission includes: Alcohol  level, CBC, CMP and UDS-Positive for cannabis   ## Disposition:-- We recommend inpatient psychiatric hospitalization when medically cleared. Patient is under voluntary admission status at this time; please IVC if attempts to leave hospital.  ## Behavioral / Environmental: - No specific recommendations at this time.     ## Safety and Observation Level:  - Based on my clinical evaluation, I estimate the patient to be at no risk of self harm in the current setting. - At this time, we recommend  routine. This decision is based on my review of the chart including patient's history and current presentation, interview of the patient, mental status examination, and consideration of suicide risk including evaluating suicidal ideation, plan, intent, suicidal or self-harm behaviors, risk factors, and protective factors. This judgment is based on our ability to directly address suicide risk, implement suicide prevention strategies, and develop a safety plan while the patient is in the clinical setting. Please contact our team if there is a concern that risk level has changed.  CSSR Risk Category:C-SSRS RISK CATEGORY: Low Risk  Suicide Risk Assessment: Patient has following modifiable risk factors for suicide: untreated depression, under treated depression , recklessness, and medication noncompliance, which we are addressing by recommending inpatient Psychiatry hospitalization.[name]  Patient has following non-modifiable or demographic risk factors for suicide: male gender, separation or divorce, and psychiatric hospitalization Patient has the following protective  factors against suicide: Supportive family and no history of suicide attempts  Thank you for this consult request. Recommendations have been communicated to the primary team.  We will sign of  at this time as patient has a bed in [name]   Dennis Wanat C Monita Swier, NP-PMHNP-BC       History of Present Illness  Relevant Aspects of Hospital ED Course:  Admitted on 03/18/2024 for Alcohol  withdrawal, Alcohol  intoxication   Patient Report: Male, 53 years old well known to the facility brought in by his son for evaluation.  Patient was intoxicated on arrival and was crying stating he did not deserve to live anymore.   Patient was seen last month in the ER for same presentation of Alcohol  intoxication and at the same time was suicidal with plan to shoot himself in the head.  Patient had a safe detox treatment in the ER and was discharged as he promised to seek rehab treatment.  In April he was treated for severe alcohol  withdrawal symptoms in the Medical ICU as well. Patient was seen early this morning by [facility] Provider who recommended for patient to be reevaluated again this morning for appropriate disposition.  Patient was seen restless moving from bed to chair and back in bed.  He was also walking around in the room.  He admitted that he is happy with the Alcohol  use and states [facility] to [place] for [facility] treatment.  Patient received Mental health care at the [facility] in [place] but does not take treatment for Bipolar disorder and PTSD.  Patient uses Alcohol  for self Medicating and reports he drank 24 cans of 16 OZ Beer yesterday.  ALT/AST are elevated and states he is aware of Alcohol  affecting his well being.  Patient has visible tremors of both hands a hx of Alcohol  withdrawal seizures.  Patient meets criteria for safe inpatient Psychiatry hospitalization.  Bed has been offered at [name] and patient has agreed to go in Voluntarily.  Patient denies SI/HI/AVH. Psych ROS:   Depression: Yes Anxiety:  yes Mania (lifetime and current): na Psychosis: (lifetime and current): na  Collateral information:  Contacted -na  Review of Systems  Constitutional:  Positive for weight loss.  HENT:         Hard of hearing per previous record  Eyes: Negative.   Cardiovascular: Negative.   Gastrointestinal: Negative.   Genitourinary: Negative.   Musculoskeletal: Negative.   Skin: Negative.   Neurological: Negative.   Endo/Heme/Allergies: Negative.   Psychiatric/Behavioral:  Positive for substance abuse.        Alcoholism     Psychiatric and Social History  Psychiatric History:  Information collected from Patient/Medical records Prev Dx/Sx: Alcohol  Abuse, Anxiety, PTSD, and Depression Current Psych Provider: Denies Home Meds (current): Denies  Previous Med Trials: Gabapentin , Venlafaxine, Paxil Therapy: Denies   Prior Psych Hospitalization: Multiple  ER/hospitalizations for PTSD, Alcohol  intoxication, and SI.  Prior Self Harm: Denies Prior Violence: Denies   Family Psych History: Father - AUD     Social History:  Developmental Hx: Not assessed Educational Hx: Not assessed Occupational Hx: Works FT in [employer] with [employer] Living Situation: Lives by self in an apartment  Access to weapons/lethal means: Denies   Substance History Alcohol : 2006  Type of alcohol  Beer Last Drink 02/14/24 Number of drinks per day 80-100 per week  History of alcohol  withdrawal seizures Yes History of DT's Yes Tobacco: Yes- cigarettes Illicit drugs: Marijuana Prescription drug abuse: Denies Rehab hx: Denies Exam Findings  Physical Exam:  Vital Signs:  Temp:  [97.5 F (36.4 C)-98.3 F (36.8 C)] 98.1 F (36.7 C) (06/14 1009) Pulse Rate:  [88-120] 89 (06/14 1009) Resp:  [16-20] 18 (06/14 1009) BP: (118-134)/(78-91) 132/78 (06/14 1009) SpO2:  [95 %-100 %] 99 % (06/14 1009) Weight:  [74.8 kg] 74.8 kg (06/13 2143) Blood pressure 132/78, pulse 89,  temperature 98.1 F (36.7 C), temperature source Oral, resp. rate 18, weight 74.8 kg, SpO2 99%. Body mass index is 25.09 kg/m.  Physical Exam Vitals reviewed: emaciated, disheveled.  HENT:     Nose: Nose normal.   Cardiovascular:     Rate and Rhythm: Normal rate and regular rhythm.  Pulmonary:     Effort: Pulmonary effort is normal.   Musculoskeletal:        General: Normal range of motion.   Skin:    General: Skin is dry.   Neurological:     Mental Status: He is oriented to person, place, and time.   Psychiatric:        Attention and Perception: Perception normal. He is inattentive.        Mood and Affect: Mood is anxious.        Speech: Speech normal.        Behavior: Behavior is cooperative.        Thought Content: Thought content normal.        Cognition and Memory: Cognition and memory normal.        Judgment: Judgment is impulsive.     Mental Status Exam: General Appearance: Casual, Disheveled, and Emaciated  Orientation:  Full (Time, Place, and Person)  Memory:  Immediate;   Good Recent;   Fair Remote;   Fair  Concentration:  Concentration: Fair and Attention Span: Fair  Recall:  Fair  Attention  Poor  Eye Contact:  Minimal  Speech:  Normal Rate  Language:  Good  Volume:  Normal  Mood: Depressed, anxious but I will be ok  Affect:  Non-Congruent  Thought Process:  Coherent  Thought Content:  Logical  Suicidal Thoughts:  No  Homicidal Thoughts:  No  Judgement:  Impaired  Insight:  Shallow  Psychomotor Activity:  Tremor  Akathisia:  NA  Fund of Knowledge:  Fair      Assets:  Engineer, maintenance Social Support  Cognition:  Impaired,  Moderate  ADL's:  Impaired  AIMS (if indicated):        Other History   These have been pulled in through the EMR, reviewed, and updated if appropriate.  Family History:  The patient's family history includes Alcohol  abuse in his father.  Medical History: Past Medical History:  Diagnosis Date    Active smoker    Alcohol  abuse    Anxiety    Bipolar 2 disorder (HCC)    Depression    PTSD (post-traumatic stress disorder)     Surgical History: Past Surgical History:  Procedure Laterality Date   HERNIA REPAIR     LYMPH NODE DISSECTION       Medications:   Current Facility-Administered Medications:    LORazepam  (ATIVAN ) injection 0-4 mg, 0-4 mg, Intravenous, Q6H **OR** LORazepam  (ATIVAN ) tablet 0-4 mg, 0-4 mg, Oral, Q6H, Coretha Dew, PA-C, 1 mg at 03/19/24 0727   [START ON 03/21/2024] LORazepam  (ATIVAN ) injection 0-4 mg, 0-4 mg, Intravenous, Q12H **OR** [START ON 03/21/2024]  LORazepam  (ATIVAN ) tablet 0-4 mg, 0-4 mg, Oral, Q12H, Atlee Leach M, PA-C   thiamine  (VITAMIN B1) tablet 100 mg, 100 mg, Oral, Daily, 100 mg at 03/19/24 0931 **OR** thiamine  (VITAMIN B1) injection 100 mg, 100 mg, Intravenous, Daily, Coretha Dew, PA-C  Current Outpatient Medications:    gabapentin  (NEURONTIN ) 300 MG capsule, Take 1 capsule (300 mg total) by mouth 3 (three) times daily., Disp: 90 capsule, Rfl: 0   nicotine  (NICODERM CQ  - DOSED IN MG/24 HOURS) 14 mg/24hr patch, Place 1 patch (14 mg total) onto the skin daily., Disp: 28 patch, Rfl: 0  Allergies: Allergies  Allergen Reactions   Effexor [Venlafaxine] Hives   Paroxetine Hives and Rash    Marylene Masek C Consuella Scurlock, NP-PMHNP-BC
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
## 2024-03-19 NOTE — BH Assessment (Signed)
 Pt is scheduled to be seen by [facility] at this time.

## 2024-03-19 NOTE — Progress Notes (Signed)
   03/19/24 1600  Psych Admission Type (Psych Patients Only)  Admission Status Voluntary  Psychosocial Assessment  Patient Complaints Anxiety;Substance abuse;Restlessness  Eye Contact Fair;Intense  Facial Expression Animated  Affect Anxious  Speech Logical/coherent  Interaction Assertive  Motor Activity Slow  Appearance/Hygiene Unremarkable;In scrubs  Behavior Characteristics Cooperative  Mood Anxious  Thought Process  Coherency Circumstantial  Content WDL  Delusions None reported or observed  Perception WDL  Hallucination None reported or observed  Judgment Impaired  Confusion WDL  Danger to Self  Current suicidal ideation? Denies  Danger to Others  Danger to Others None reported or observed

## 2024-03-19 NOTE — Progress Notes (Addendum)
 Pt has been accepted to [facility] on 03/19/2024 . Bed assignment:400-2   Pt meets inpatient criteria per [place]  Sulema Endo, NP   Attending Physician will be Dr. Zouev  Report can be called to: Adult unit: [phone]  Pt can arrive pending labs   Care Team Notified: [facility] Bevin Bucks, NP, Willeen Harold, RN , Arvell Latin, NP

## 2024-03-20 DIAGNOSIS — F1020 Alcohol dependence, uncomplicated: Secondary | ICD-10-CM | POA: Diagnosis not present

## 2024-03-20 DIAGNOSIS — F4310 Post-traumatic stress disorder, unspecified: Secondary | ICD-10-CM | POA: Diagnosis not present

## 2024-03-20 NOTE — Progress Notes (Signed)
   03/20/24 2200  Psych Admission Type (Psych Patients Only)  Admission Status Voluntary  Psychosocial Assessment  Patient Complaints None  Eye Contact Fair  Facial Expression Animated  Affect Appropriate to circumstance  Speech Logical/coherent  Interaction Assertive  Motor Activity Slow  Appearance/Hygiene Unremarkable  Behavior Characteristics Cooperative;Appropriate to situation  Mood Pleasant  Thought Process  Coherency WDL  Content WDL  Delusions None reported or observed  Perception WDL  Hallucination None reported or observed  Judgment Poor  Confusion None  Danger to Self  Current suicidal ideation? Denies  Danger to Others  Danger to Others None reported or observed

## 2024-03-20 NOTE — Progress Notes (Signed)
   03/19/24 2221  Psych Admission Type (Psych Patients Only)  Admission Status Voluntary  Psychosocial Assessment  Patient Complaints Anxiety;Depression;Substance abuse  Eye Contact Fair  Facial Expression Anxious  Affect Anxious  Speech Logical/coherent  Interaction Assertive  Motor Activity Slow  Appearance/Hygiene In scrubs  Behavior Characteristics Cooperative  Mood Anxious  Thought Process  Coherency Circumstantial  Content WDL  Delusions None reported or observed  Perception WDL  Hallucination None reported or observed  Judgment Impaired  Confusion None  Danger to Self  Current suicidal ideation? Denies  Danger to Others  Danger to Others None reported or observed
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
## 2024-03-20 NOTE — Plan of Care (Signed)
 ?  Problem: Activity: ?Goal: Interest or engagement in activities will improve ?Outcome: Progressing ?Goal: Sleeping patterns will improve ?Outcome: Progressing ?  ?Problem: Coping: ?Goal: Ability to verbalize frustrations and anger appropriately will improve ?Outcome: Progressing ?Goal: Ability to demonstrate self-control will improve ?Outcome: Progressing ?  ?Problem: Safety: ?Goal: Periods of time without injury will increase ?Outcome: Progressing ?  ?

## 2024-03-20 NOTE — Progress Notes (Signed)
   03/20/24 0930  Psych Admission Type (Psych Patients Only)  Admission Status Voluntary  Psychosocial Assessment  Patient Complaints Anxiety;Depression;Substance abuse  Eye Contact Fair  Facial Expression Anxious  Affect Anxious;Irritable  Speech Logical/coherent  Interaction Assertive  Motor Activity Slow  Appearance/Hygiene Unremarkable  Behavior Characteristics Cooperative;Anxious  Mood Depressed;Anxious  Thought Process  Coherency WDL  Content WDL  Delusions None reported or observed  Perception WDL  Hallucination None reported or observed  Judgment Impaired  Confusion None  Danger to Self  Current suicidal ideation? Denies  Danger to Others  Danger to Others None reported or observed

## 2024-03-20 NOTE — Progress Notes (Signed)
 Pt stated he could not sleep, pt was offered sleep medication , pt stated he does not take that.
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
## 2024-03-20 NOTE — Plan of Care (Signed)

## 2024-03-20 NOTE — BHH Suicide Risk Assessment (Signed)
 [facility] Admission Suicide Risk Assessment   Nursing information obtained from:  Patient Demographic factors:  Male, Caucasian, Living alone Current Mental Status:  NA Loss Factors:  NA Historical Factors:  NA Risk Reduction Factors:  Sense of responsibility to family, Positive coping skills or problem solving skills  Total Time spent with patient: 1 hour Principal Problem: Alcohol  use disorder, severe, dependence (HCC) Diagnosis:  Principal Problem:   Alcohol  use disorder, severe, dependence (HCC) Active Problems:   PTSD (post-traumatic stress disorder)  Subjective Data: See H&P  Continued Clinical Symptoms:  Alcohol  Use Disorder Identification Test Final Score (AUDIT): 33 The Alcohol  Use Disorders Identification Test, Guidelines for Use in Primary Care, Second Edition.  World Science writer [facility]). Score between 0-7:  no or low risk or alcohol  related problems. Score between 8-15:  moderate risk of alcohol  related problems. Score between 16-19:  high risk of alcohol  related problems. Score 20 or above:  warrants further diagnostic evaluation for alcohol  dependence and treatment.   CLINICAL FACTORS:   Severe Anxiety and/or Agitation Alcohol /Substance Abuse/Dependencies   Psychiatric Specialty Exam: See H&P   COGNITIVE FEATURES THAT CONTRIBUTE TO RISK:  None    SUICIDE RISK:   Minimal: No identifiable suicidal ideation.  Patients presenting with no risk factors but with morbid ruminations; may be classified as minimal risk based on the severity of the depressive symptoms  PLAN OF CARE: See H&P  I certify that inpatient services furnished can reasonably be expected to improve the patient's condition.   Dennis Moss, MD 03/20/2024, 5:34 PM

## 2024-03-20 NOTE — Progress Notes (Signed)
 CSW attempted to complete Suicide Prevention Information with pt's son, Giovanne Nickolson. Voicemail message left requesting a return call. Another attempt will be made at a later time

## 2024-03-20 NOTE — Group Note (Signed)
 Date:  03/20/2024 Time:  9:29 AM  Group Topic/Focus:  Goals Group:   The focus of this group is to help patients establish daily goals to achieve during treatment and discuss how the patient can incorporate goal setting into their daily lives to aide in recovery.    Participation Level:  Active  Participation Quality:  Appropriate  Affect:  Appropriate  Cognitive:  Appropriate  Insight: Appropriate  Engagement in Group:  Engaged  Modes of Intervention:  Orientation  Additional Comments:    Violette Grief 03/20/2024, 9:29 AM

## 2024-03-20 NOTE — Group Note (Signed)
 LCSW Group Therapy Note  Group Date: 03/20/2024 Start Time: 1100 End Time: 1200   Type of Therapy and Topic:  Group Therapy - Healthy vs Unhealthy Coping Skills  Participation Level:  Active   Description of Group The focus of this group was to determine what unhealthy coping techniques typically are used by group members and what healthy coping techniques would be helpful in coping with various problems. Patients were guided in becoming aware of the differences between healthy and unhealthy coping techniques. Patients were asked to identify 2-3 healthy coping skills they would like to learn to use more effectively.  Therapeutic Goals Patients learned that coping is what human beings do all day long to deal with various situations in their lives Patients defined and discussed healthy vs unhealthy coping techniques Patients identified their preferred coping techniques and identified whether these were healthy or unhealthy Patients determined 2-3 healthy coping skills they would like to become more familiar with and use more often. Patients provided support and ideas to each other   Summary of Patient Progress:  During group, patient expressed openly. Patient proved open to input from peers and feedback from CSW. Patient demonstrated proficient insight into the subject matter, was respectful of peers, and participated throughout the entire session.   Therapeutic Modalities Cognitive Behavioral Therapy Motivational Interviewing  Roselle Conner, LCSWA 03/20/2024  11:50 AM

## 2024-03-21 ENCOUNTER — Encounter (HOSPITAL_COMMUNITY): Payer: Self-pay

## 2024-03-21 DIAGNOSIS — F1020 Alcohol dependence, uncomplicated: Secondary | ICD-10-CM | POA: Diagnosis not present

## 2024-03-21 NOTE — Group Note (Signed)
 Recreation Therapy Group Note   Group Topic:Stress Management  Group Date: 03/21/2024 Start Time: 0981 End Time: 1003 Facilitators: Ikesha Siller-McCall, LRT,CTRS Location: 300 Hall Dayroom   Group Topic: Stress Management   Goal Area(s) Addresses:  Patient will actively participate in stress management techniques presented during session.  Patient will successfully identify benefit of practicing stress management post d/c.   Behavioral Response: Appropriate  Intervention: Relaxation exercise with ambient sound and script   Activity: Guided Imagery. LRT provided education, instruction, and demonstration on practice of visualization via guided imagery. Patient was asked to participate in the technique introduced during session. LRT debriefed including topics of mindfulness, stress management and specific scenarios each patient could use these techniques. Patients were given suggestions of ways to access scripts post d/c and encouraged to explore Youtube and other apps available on smartphones, tablets, and computers.  Education:  Stress Management, Discharge Planning.   Education Outcome: Acknowledges education   Affect/Mood: Flat   Participation Level: Engaged   Participation Quality: Independent   Behavior: Appropriate   Speech/Thought Process: Focused   Insight: Good   Judgement: Good   Modes of Intervention: Guided Imagery   Patient Response to Interventions:  Engaged   Education Outcome:  In group clarification offered    Clinical Observations/Individualized Feedback: Patient actively engaged in technique introduced, expressed no concerns and demonstrated ability to practice skill independently post d/c.     Plan: Continue to engage patient in RT group sessions 2-3x/week.   Marissa Weaver-McCall, LRT,CTRS 03/21/2024 1:14 PM

## 2024-03-21 NOTE — Group Note (Signed)
 Date:  03/21/2024 Time:  10:28 AM  Group Topic/Focus:  Emotional Education:   The focus of this group is to discuss what feelings/emotions are, and how they are experienced. Goals Group:   The focus of this group is to help patients establish daily goals to achieve during treatment and discuss how the patient can incorporate goal setting into their daily lives to aide in recovery. Orientation:   The focus of this group is to educate the patient on the purpose and policies of crisis stabilization and provide a format to answer questions about their admission.  The group details unit policies and expectations of patients while admitted.    Participation Level:  Active  Participation Quality:  Appropriate  Affect:  Appropriate  Cognitive:  Appropriate  Insight: Good  Engagement in Group:  Engaged  Modes of Intervention:  Discussion  Additional Comments:  Pt goal is to work on physical fitness.    [name] 03/21/2024, 10:28 AM

## 2024-03-21 NOTE — Plan of Care (Signed)

## 2024-03-21 NOTE — Group Note (Signed)
 Date:  03/21/2024 Time:  1:19 AM  Group Topic/Focus:  Wrap-Up Group:   The focus of this group is to help patients review their daily goal of treatment and discuss progress on daily workbooks.    Participation Level:  Active  Participation Quality:  Appropriate  Affect:  Appropriate  Cognitive:  Appropriate  Insight: Improving  Engagement in Group:  Engaged  Modes of Intervention:  Discussion  Additional Comments:  Pt attended the evening wrap-up group. Tech introduced the staff for the evening, reminded group of the evening schedule and reminded them to ask for anything they need. Pt read and discussed a poem named Autobiography in [employer]. Pt shared his perspective on the poem and lessons learned in AA that connect to the poem.  Leanor Proper Byrl Latin 03/21/2024, 1:19 AM

## 2024-03-21 NOTE — BHH Group Notes (Signed)
 Spirituality Group   Group Goal: Support / Education around grief and loss    Group Description: Following introductions and group rules, group members engaged in facilitated group dialog and support around topic of loss, with particular support around experiences of loss in their lives. Group members identified types of loss (relationships / self / things) as well as patterns, circumstances, and changes that precipitate loss. Reflection invited on thoughts / feelings around loss, normalized grief responses, and recognized variety in grief experience. Group noted Worden's four tasks of grief in discussion. Group drew on Adlerian / Rogerian, narrative, MI, with [facility]alom's group therapy as a primary framework.   Observations: [name] engaged peers and reflected thoughtfully, contributing to the group discussion.  [name] L. Minetta Aly, M.Div [phone]

## 2024-03-21 NOTE — BH IP Treatment Plan (Signed)
 Interdisciplinary Treatment and Diagnostic Plan Update  03/21/2024 Time of Session: 11:09AM MARTINO TOMPSON MRN: [id number]  Principal Diagnosis: Alcohol  use disorder, severe, dependence (HCC)  Secondary Diagnoses: Principal Problem:   Alcohol  use disorder, severe, dependence (HCC) Active Problems:   PTSD (post-traumatic stress disorder)   Current Medications:  Current Facility-Administered Medications  Medication Dose Route Frequency Provider Last Rate Last Admin   acetaminophen  (TYLENOL ) tablet 650 mg  650 mg Oral Q6H PRN Onuoha, Josephine C, NP   650 mg at 03/19/24 1613   alum & mag hydroxide-simeth (MAALOX/MYLANTA) 200-200-20 MG/5ML suspension 30 mL  30 mL Oral Q4H PRN Onuoha, Josephine C, NP       haloperidol  (HALDOL ) tablet 5 mg  5 mg Oral TID PRN Onuoha, Josephine C, NP       And   diphenhydrAMINE  (BENADRYL ) capsule 50 mg  50 mg Oral TID PRN Onuoha, Josephine C, NP       haloperidol  lactate (HALDOL ) injection 5 mg  5 mg Intramuscular TID PRN Onuoha, Josephine C, NP       And   diphenhydrAMINE  (BENADRYL ) injection 50 mg  50 mg Intramuscular TID PRN Onuoha, Josephine C, NP       And   LORazepam  (ATIVAN ) injection 2 mg  2 mg Intramuscular TID PRN Onuoha, Josephine C, NP       haloperidol  lactate (HALDOL ) injection 10 mg  10 mg Intramuscular TID PRN Onuoha, Josephine C, NP       And   diphenhydrAMINE  (BENADRYL ) injection 50 mg  50 mg Intramuscular TID PRN Onuoha, Josephine C, NP       And   LORazepam  (ATIVAN ) injection 2 mg  2 mg Intramuscular TID PRN Onuoha, Josephine C, NP       gabapentin  (NEURONTIN ) capsule 300 mg  300 mg Oral TID Onuoha, Josephine C, NP   300 mg at 03/21/24 1150   hydrOXYzine  (ATARAX ) tablet 25 mg  25 mg Oral Q6H PRN Zouev, Dmitri, MD   25 mg at 03/21/24 1150   loperamide  (IMODIUM ) capsule 2-4 mg  2-4 mg Oral PRN Zouev, Dmitri, MD       LORazepam  (ATIVAN ) tablet 0-4 mg  0-4 mg Oral Q12H Onuoha, Josephine C, NP       LORazepam  (ATIVAN ) tablet 1 mg  1 mg  Oral Q6H PRN Zouev, Dmitri, MD       multivitamin with minerals tablet 1 tablet  1 tablet Oral Daily Zouev, Dmitri, MD   1 tablet at 03/21/24 0802   nicotine  (NICODERM CQ  - dosed in mg/24 hours) patch 14 mg  14 mg Transdermal Daily Onuoha, Josephine C, NP       nicotine  polacrilex (NICORETTE ) gum 2 mg  2 mg Oral PRN Onuoha, Josephine C, NP   2 mg at 03/21/24 1243   ondansetron  (ZOFRAN -ODT) disintegrating tablet 4 mg  4 mg Oral Q6H PRN Zouev, Dmitri, MD       thiamine  (Vitamin B-1) tablet 100 mg  100 mg Oral Daily Zouev, Dmitri, MD   100 mg at 03/21/24 0802   traZODone  (DESYREL ) tablet 50 mg  50 mg Oral QHS PRN Zouev, Dmitri, MD   50 mg at 03/19/24 2117   PTA Medications: Medications Prior to Admission  Medication Sig Dispense Refill Last Dose/Taking   nicotine  (NICODERM CQ  - DOSED IN MG/24 HOURS) 14 mg/24hr patch Place 1 patch (14 mg total) onto the skin daily. (Patient not taking: Reported on 03/19/2024) 28 patch 0     Patient Stressors:  Patient Strengths:    Treatment Modalities: Medication Management, Group therapy, Case management,  1 to 1 session with clinician, Psychoeducation, Recreational therapy.   Physician Treatment Plan for Primary Diagnosis: Alcohol  use disorder, severe, dependence (HCC) Long Term Goal(s):     Short Term Goals:    Medication Management: Evaluate patient's response, side effects, and tolerance of medication regimen.  Therapeutic Interventions: 1 to 1 sessions, Unit Group sessions and Medication administration.  Evaluation of Outcomes: Not Progressing  Physician Treatment Plan for Secondary Diagnosis: Principal Problem:   Alcohol  use disorder, severe, dependence (HCC) Active Problems:   PTSD (post-traumatic stress disorder)  Long Term Goal(s):     Short Term Goals:       Medication Management: Evaluate patient's response, side effects, and tolerance of medication regimen.  Therapeutic Interventions: 1 to 1 sessions, Unit Group sessions and  Medication administration.  Evaluation of Outcomes: Not Progressing   RN Treatment Plan for Primary Diagnosis: Alcohol  use disorder, severe, dependence (HCC) Long Term Goal(s): Knowledge of disease and therapeutic regimen to maintain health will improve  Short Term Goals: Ability to remain free from injury will improve, Ability to verbalize frustration and anger appropriately will improve, Ability to demonstrate self-control, Ability to participate in decision making will improve, Ability to verbalize feelings will improve, Ability to disclose and discuss suicidal ideas, Ability to identify and develop effective coping behaviors will improve, and Compliance with prescribed medications will improve  Medication Management: RN will administer medications as ordered by provider, will assess and evaluate patient's response and provide education to patient for prescribed medication. RN will report any adverse and/or side effects to prescribing provider.  Therapeutic Interventions: 1 on 1 counseling sessions, Psychoeducation, Medication administration, Evaluate responses to treatment, Monitor vital signs and CBGs as ordered, Perform/monitor CIWA, COWS, AIMS and Fall Risk screenings as ordered, Perform wound care treatments as ordered.  Evaluation of Outcomes: Not Progressing   LCSW Treatment Plan for Primary Diagnosis: Alcohol  use disorder, severe, dependence (HCC) Long Term Goal(s): Safe transition to appropriate next level of care at discharge, Engage patient in therapeutic group addressing interpersonal concerns.  Short Term Goals: Engage patient in aftercare planning with referrals and resources, Increase social support, Increase ability to appropriately verbalize feelings, Increase emotional regulation, Facilitate acceptance of mental health diagnosis and concerns, Facilitate patient progression through stages of change regarding substance use diagnoses and concerns, Identify triggers associated  with mental health/substance abuse issues, and Increase skills for wellness and recovery  Therapeutic Interventions: Assess for all discharge needs, 1 to 1 time with Social worker, Explore available resources and support systems, Assess for adequacy in community support network, Educate family and significant other(s) on suicide prevention, Complete Psychosocial Assessment, Interpersonal group therapy.  Evaluation of Outcomes: Not Progressing   Progress in Treatment: Attending groups: Yes. Participating in groups: Yes. Taking medication as prescribed: Yes. Toleration medication: Yes. Family/Significant other contact made: Yes, individual(s) contacted:  Son Patient understands diagnosis: Yes. Discussing patient identified problems/goals with staff: Yes. Medical problems stabilized or resolved: Yes. Denies suicidal/homicidal ideation: Yes. Issues/concerns per patient self-inventory: No. Other: n/a  New problem(s) identified: No, Describe:  None  New Short Term/Long Term Goal(s): detox, medication management for mood stabilization; elimination of SI thoughts; development of comprehensive mental wellness/sobriety plan  Patient Goals: Discharge.  Discharge Plan or Barriers: Patient recently admitted. CSW will continue to follow and assess for appropriate referrals and possible discharge planning.    Reason for Continuation of Hospitalization: Anxiety Withdrawal symptoms Other; describe Mood Stabilization, Discharging  Planning  Estimated Length of Stay: 3-5 DAYS  Last 3 [place] Suicide Severity Risk Score: Flowsheet Row [facility] (Current) [facility]18[facility]24 [facility] ED from 02/14/2024 in [facility] Department at [facility]  C-SSRS RISK CATEGORY No Risk Low Risk No Risk    Last PHQ 2/9 Scores:    12/25/2023    8:37 AM 12/23/2023    1:14 PM   Depression screen PHQ 2/9  Decreased Interest 0 0  Down, Depressed, Hopeless 0 0  PHQ - 2 Score 0 0    Scribe for Treatment Team: Anetta Olvera N Dayven Linsley, LCSW 03/21/2024 2:22 PM

## 2024-03-21 NOTE — Group Note (Signed)
 Occupational Therapy Group Note  Group Topic: Sleep Hygiene  Group Date: 03/21/2024 Start Time: 1430 End Time: 1500 Facilitators: Lynnda Sas, OT   Group Description: Group encouraged increased participation and engagement through topic focused on sleep hygiene. Patients reflected on the quality of sleep they typically receive and identified areas that need improvement. Group was given background information on sleep and sleep hygiene, including common sleep disorders. Group members also received information on how to improve one's sleep and introduced a sleep diary as a tool that can be utilized to track sleep quality over a length of time. Group session ended with patients identifying one or more strategies they could utilize or implement into their sleep routine in order to improve overall sleep quality.        Therapeutic Goal(s):  Identify one or more strategies to improve overall sleep hygiene  Identify one or more areas of sleep that are negatively impacted (sleep too much, too little, etc)     Participation Level: Engaged   Participation Quality: Independent   Behavior: Appropriate   Speech/Thought Process: Relevant   Affect/Mood: Appropriate   Insight: Fair   Judgement: Fair      Modes of Intervention: Education  Patient Response to Interventions:  Attentive   Plan: Continue to engage patient in OT groups 2 - 3x/week.  03/21/2024  Lynnda Sas, OT   Mykel Sponaugle, OT

## 2024-03-21 NOTE — Progress Notes (Signed)
 [facility] MD Progress Note  03/21/2024 7:55 PM [name]  MRN:  [id number] Subjective:   53 year old Caucasian male, divorced, veteran, lives alone.  Background history of alcohol  use disorder and PTSD.  Presented in company of a friend on account of suicidal thoughts.  Intoxicated with alcohol  at presentation with BAL 195 mg/dl.  Evidence of liver strain by mildly increased AST and ALT.  UDS was positive for THC.  I assumed care of the patient today.  Chart reviewed.  Patient discussed at multidisciplinary team meeting.  Nursing staff reports that patient has been focused on being discharged today.  He has not required lorazepam  yesterday.  His CIWA score today is 3.  No observed response to internal stimuli but irritated with continued hospital stay.  Social worker was able to obtain collateral from patient's son.  Patient's son adamant that he never made any suicidal comments.  No current concerns about suicidality.  Patient accosted me earlier on the hallway and wanted to know why he could not leave today.  Safety concerns were discussed with him as he had been in the inpatient unit less than 48 hours.  Importance of making sure that he has totally withdrawn from psychoactive substances was reinstated.  At team meeting, the patient states that he typically binges on alcohol  during festivities like Veterans Day.  States that recent binge was in context of the [employer] holiday.  Patient states that he well rooted in [place].  States that he has been in rehab over five times.  No interest in taking any psychotropic medication.  No interesting use of naltrexone to cope craving.  Patient states that he is not currently craving for alcohol .  He is eager to go home.  No tactile hallucination.  No visual hallucination.  No auditory hallucination.  Patient is not endorsing any delusions.  No feeling of impending doom.  No self-injurious thoughts.  No rageful thoughts towards others or the property.  No  acute PTSD symptoms.  Supportive approach today.  Encouraged to keep ventilating his feelings to staff.   Principal Problem: Alcohol  use disorder, severe, dependence (HCC) Diagnosis: Principal Problem:   Alcohol  use disorder, severe, dependence (HCC) Active Problems:   PTSD (post-traumatic stress disorder)  Total Time spent with patient: 20 minutes  Past Psychiatric History:  See H&P  Past Medical History:  Past Medical History:  Diagnosis Date   Active smoker    Alcohol  abuse    Anxiety    Bipolar 2 disorder (HCC)    Depression    PTSD (post-traumatic stress disorder)     Past Surgical History:  Procedure Laterality Date   HERNIA REPAIR     LYMPH NODE DISSECTION     Family History:  Family History  Problem Relation Age of Onset   Alcohol  abuse Father    Family Psychiatric  History:  See H&P  Social History:  Social History   Substance and Sexual Activity  Alcohol  Use Not Currently   Comment: Patient states he is a binge drinker. Last drink 06/09/23     Social History   Substance and Sexual Activity  Drug Use Yes   Types: Marijuana    Social History   Socioeconomic History   Marital status: Legally Separated    Spouse name: Not on file   Number of children: Not on file   Years of education: Not on file   Highest education level: Not on file  Occupational History   Not on file  Tobacco Use  Smoking status: Every Day    Current packs/day: 2.00    Average packs/day: 2.0 packs/day for 20.0 years (40.0 ttl pk-yrs)    Types: Cigarettes   Smokeless tobacco: Never  Vaping Use   Vaping status: Never Used  Substance and Sexual Activity   Alcohol  use: Not Currently    Comment: Patient states he is a binge drinker. Last drink 06/09/23   Drug use: Yes    Types: Marijuana   Sexual activity: Yes  Other Topics Concern   Not on file  Social History Narrative   Has an ex-wife and 33 yo son whom he cannot see now, as his ex-wife divorced him. Was in the  [employer] and went to [place], [place], and may have some PTSD symptoms from this. Was also a Management consultant. Heavy alcohol  abuse during binges, but has had periods of sobriety for months on end. Has been drinking up to two cases of beer a day   Social Drivers of Corporate investment banker Strain: Not on file  Food Insecurity: No Food Insecurity (03/19/2024)   Hunger Vital Sign    Worried About Running Out of Food in the Last Year: Never true    Ran Out of Food in the Last Year: Never true  Recent Concern: Food Insecurity - Food Insecurity Present (12/22/2023)   Hunger Vital Sign    Worried About Running Out of Food in the Last Year: Sometimes true    Ran Out of Food in the Last Year: Never true  Transportation Needs: Unmet Transportation Needs (03/19/2024)   PRAPARE - Administrator, Civil Service (Medical): No    Lack of Transportation (Non-Medical): Yes  Physical Activity: Not on file  Stress: Not on file  Social Connections: Unknown (10/26/2023)   Social Connection and Isolation Panel    Frequency of Communication with Friends and Family: Not on file    Frequency of Social Gatherings with Friends and Family: Not on file    Attends Religious Services: Not on Marketing executive or Organizations: Not on file    Attends Banker Meetings: Not on file    Marital Status: Divorced    Current Medications: Current Facility-Administered Medications  Medication Dose Route Frequency Provider Last Rate Last Admin   acetaminophen  (TYLENOL ) tablet 650 mg  650 mg Oral Q6H PRN Onuoha, Josephine C, NP   650 mg at 03/19/24 1613   alum & mag hydroxide-simeth (MAALOX/MYLANTA) 200-200-20 MG/5ML suspension 30 mL  30 mL Oral Q4H PRN Onuoha, Josephine C, NP       haloperidol  (HALDOL ) tablet 5 mg  5 mg Oral TID PRN Onuoha, Josephine C, NP       And   diphenhydrAMINE  (BENADRYL ) capsule 50 mg  50 mg Oral TID PRN Onuoha, Josephine C, NP       haloperidol  lactate (HALDOL )  injection 5 mg  5 mg Intramuscular TID PRN Onuoha, Josephine C, NP       And   diphenhydrAMINE  (BENADRYL ) injection 50 mg  50 mg Intramuscular TID PRN Onuoha, Josephine C, NP       And   LORazepam  (ATIVAN ) injection 2 mg  2 mg Intramuscular TID PRN Onuoha, Josephine C, NP       haloperidol  lactate (HALDOL ) injection 10 mg  10 mg Intramuscular TID PRN Onuoha, Josephine C, NP       And   diphenhydrAMINE  (BENADRYL ) injection 50 mg  50 mg Intramuscular TID PRN Onuoha, Josephine  C, NP       And   LORazepam  (ATIVAN ) injection 2 mg  2 mg Intramuscular TID PRN Onuoha, Josephine C, NP       gabapentin  (NEURONTIN ) capsule 300 mg  300 mg Oral TID Onuoha, Josephine C, NP   300 mg at 03/21/24 1819   hydrOXYzine  (ATARAX ) tablet 25 mg  25 mg Oral Q6H PRN Zouev, Dmitri, MD   25 mg at 03/21/24 1150   loperamide  (IMODIUM ) capsule 2-4 mg  2-4 mg Oral PRN Zouev, Dmitri, MD       LORazepam  (ATIVAN ) tablet 0-4 mg  0-4 mg Oral Q12H Onuoha, Josephine C, NP       LORazepam  (ATIVAN ) tablet 1 mg  1 mg Oral Q6H PRN Zouev, Dmitri, MD       multivitamin with minerals tablet 1 tablet  1 tablet Oral Daily Zouev, Dmitri, MD   1 tablet at 03/21/24 0802   nicotine  (NICODERM CQ  - dosed in mg/24 hours) patch 14 mg  14 mg Transdermal Daily Onuoha, Josephine C, NP       nicotine  polacrilex (NICORETTE ) gum 2 mg  2 mg Oral PRN Onuoha, Josephine C, NP   2 mg at 03/21/24 1818   ondansetron  (ZOFRAN -ODT) disintegrating tablet 4 mg  4 mg Oral Q6H PRN Zouev, Dmitri, MD       thiamine  (Vitamin B-1) tablet 100 mg  100 mg Oral Daily Zouev, Dmitri, MD   100 mg at 03/21/24 0802   traZODone  (DESYREL ) tablet 50 mg  50 mg Oral QHS PRN Zouev, Dmitri, MD   50 mg at 03/19/24 2117    Lab Results: No results found for this or any previous visit (from the past 48 hours).  Blood Alcohol  level:  Lab Results  Component Value Date   ETH 195 (H) 03/18/2024   ETH 359 (HH) 02/14/2024    Metabolic Disorder Labs: No results found for: HGBA1C,  MPG No results found for: PROLACTIN Lab Results  Component Value Date   CHOL 225 (H) 10/14/2013   TRIG 112 10/14/2013   HDL 62 10/14/2013   CHOLHDL 3.6 10/14/2013   VLDL 22 10/14/2013   LDLCALC 141 (H) 10/14/2013   LDLCALC (H) 12/24/2010    156        Total Cholesterol/HDL:CHD Risk Coronary Heart Disease Risk Table                     Men   Women  1/2 Average Risk   3.4   3.3  Average Risk       5.0   4.4  2 X Average Risk   9.6   7.1  3 X Average Risk  23.4   11.0        Use the calculated Patient Ratio above and the CHD Risk Table to determine the patient's CHD Risk.        ATP III CLASSIFICATION (LDL):  <100     mg/dL   Optimal  010-272  mg/dL   Near or Above                    Optimal  130-159  mg/dL   Borderline  536-644  mg/dL   High  >034     mg/dL   Very High    Physical Findings: AIMS:  ,  ,  ,  ,  ,  ,   CIWA:  CIWA-Ar Total: 0 COWS:     Musculoskeletal: Strength & Muscle Tone: within  normal limits Gait & Station: normal Patient leans: N/A  Psychiatric Specialty Exam:  Presentation  General Appearance and behavior:  Slim build, in hospital clothing, flushed, mild tremors.  Not confused.  Eye Contact: Normal conjugate eye movement.  Good eye contact.  Speech: Spontaneous.  Not pressured.  Not loud.  Mood and Affect  Mood: Dysphoric he initially but calmed down later.  Affect: Restricted and mood congruent.  Thought Process  Thought Processes: Linear and goal directed.  Descriptions of Associations:Intact  Orientation:Full (Time, Place and Person)  Thought Content: Future oriented.  No current suicidal thoughts.  No homicidal thoughts.  No thoughts of violence.  No negative ruminative flooding.  No guilty ruminations.  No delusional theme.  No obsessions.  Hallucinations: No hallucination in any modality.  Sensorium  Memory: Good.  Judgment: Good.  Insight: Good  Executive Functions  Concentration: Good.  Attention  Span: Good.  Recall: Good.  Fund of Knowledge: Good.  Language: Good   Psychomotor Activity  Normal psychomotor activity    Physical Exam: Physical Exam ROS Blood pressure 122/82, pulse 73, temperature 98.3 F (36.8 C), temperature source Oral, resp. rate 18, height 5' 8 (1.727 m), weight 74.8 kg, SpO2 96%. Body mass index is 25.07 kg/m.   Treatment Plan Summary: Patient presented with alcohol  withdrawals.  Admission was in context of suicidality.  He is dismissive of any intent to harm himself.  He is not keen on any psychotropic medication.  We will continue to supervise his withdrawal from alcohol .  1.  Continue CIWA protocol 2.  Continue to monitor mood behavior and interaction with others 3.  Continue to encourage unit groups and therapeutic activities 4.  Social worker will coordinate discharge and aftercare planning   Amelie Jury, MD 03/21/2024, 7:55 PM

## 2024-03-21 NOTE — Progress Notes (Addendum)
 1000 Pt appears irritable d/t not discharging as he thought.  Pt is pacing and periodically yelling out loudly in frustration.  Pt appears bright with full range affect and euthymic mood.  He expressed some mild anxiety about going home but verbalized it is understandable.  Pt denied SI/HI/AVH and any pain.  Pt denied detox symptoms CIWA=2 d/t mild anxiety.   03/21/24 0800  Psych Admission Type (Psych Patients Only)  Admission Status Voluntary  Psychosocial Assessment  Patient Complaints None  Eye Contact Fair  Facial Expression Animated  Affect Appropriate to circumstance  Speech Logical/coherent  Interaction Assertive  Motor Activity Slow  Appearance/Hygiene Unremarkable  Behavior Characteristics Cooperative  Mood Pleasant  Thought Process  Coherency WDL  Content WDL  Delusions None reported or observed  Perception WDL  Hallucination None reported or observed  Judgment WDL  Confusion None  Danger to Self  Current suicidal ideation? Denies  Danger to Others  Danger to Others None reported or observed

## 2024-03-21 NOTE — [facility] Suicide Risk Assessment (Signed)
 [facility] INPATIENT:  Family/Significant Other Suicide Prevention Education  Suicide Prevention Education:  Contact Attempts: Altamease Jes (son) [phone], (name of family member/significant other) has been identified by the patient as the family member/significant other with whom the patient will be residing, and identified as the person(s) who will aid the patient in the event of a mental health crisis.  With written consent from the patient, two attempts were made to provide suicide prevention education, prior to and/or following the patient's discharge.  We were unsuccessful in providing suicide prevention education.  A suicide education pamphlet was given to the patient to share with family/significant other.  Date and time of first attempt: 03/20/24, lvm Date and time of second attempt: 03/21/24 @ 0915  Vonzell Guerin 03/21/2024, 10:05 AM

## 2024-03-21 NOTE — [facility] Suicide Risk Assessment (Addendum)
 [facility] INPATIENT:  Family/Significant Other Suicide Prevention Education  Suicide Prevention Education:  Education Completed; Micha Dosanjh (son) [phone] ,  (name of family member/significant other) has been identified by the patient as the family member/significant other with whom the patient will be residing, and identified as the person(s) who will aid the patient in the event of a mental health crisis (suicidal ideations/suicide attempt).  With written consent from the patient, the family member/significant other has been provided the following suicide prevention education, prior to the and/or following the discharge of the patient.  Pt's neighbor called and alerted Kaaren Ora that pt had been drinking and had asked for help. Son went to the ED and sat with pt until he was transferred to [facility]. States pt never made suicidal statements (while in ED or since being at [facility]) and believes pt's PTSD is worsened when drinking but that he would not hurt himself. Confirmed that pt follows up at the [place].  No safety concerns, no access to weapons or firearms. Son should be able to get pt at discharge, other wise will need ride home.   The suicide prevention education provided includes the following: Suicide risk factors Suicide prevention and interventions National Suicide Hotline telephone number [facility] assessment telephone number [facility] Emergency Assistance 911 [facility] and/or Residential Mobile Crisis Unit telephone number  Request made of family/significant other to: Remove weapons (e.g., guns, rifles, knives), all items previously/currently identified as safety concern.   Remove drugs/medications (over-the-counter, prescriptions, illicit drugs), all items previously/currently identified as a safety concern.  The family member/significant other verbalizes understanding of the suicide prevention education information provided.  The family member/significant other agrees to  remove the items of safety concern listed above.  Vonzell Guerin 03/21/2024, 11:17 AM

## 2024-03-21 NOTE — [facility] Group Notes (Signed)
 The focus of this group is to help patients review their daily goal of treatment and discuss progress on daily workbooks. Pt was attentive and appropriate during tonight's wrap up group with aa.

## 2024-03-22 DIAGNOSIS — F1020 Alcohol dependence, uncomplicated: Secondary | ICD-10-CM | POA: Diagnosis not present

## 2024-03-22 MED ORDER — GABAPENTIN 300 MG PO CAPS: 300.0000 mg | ORAL_CAPSULE | Freq: Three times a day (TID) | ORAL | 0 refills | Status: DC

## 2024-03-22 NOTE — Progress Notes (Signed)
  [facility] Adult Case Management Discharge Plan :  Will you be returning to the same living situation after discharge:  Yes,  pt returning home at discharge At discharge, do you have transportation home?: Yes,  CSW arranged [employer] for 1130am Do you have the ability to pay for your medications: Yes,  pt has active health insurance coverage  Release of information consent forms completed and in the chart;  Patient's signature needed at discharge.  Patient to Follow up at:  Follow-up Information     [facility] up on 03/25/2024.   Why: You have a nurse call visit on  03/25/24 at 1:00 pm.  You have a hospital follow up appointment on 04/12/24 at 1:30 pm, to obtain therapy and medication management services, in person. Contact information: [address] [facility] [place] [address] [phone]                 Next level of care provider has access to [facility] Link:no  Safety Planning and Suicide Prevention discussed: Yes,   Dameir Gentzler (son) [phone]     Has patient been referred to the Quitline?: Patient refused referral for treatment  Patient has been referred for addiction treatment: Patient refused referral for treatment.  Vonzell Guerin, LCSWA 03/22/2024, 8:37 AM

## 2024-03-22 NOTE — Progress Notes (Signed)
   03/21/24 2218  Psych Admission Type (Psych Patients Only)  Admission Status Voluntary  Psychosocial Assessment  Patient Complaints None  Eye Contact Fair  Facial Expression Animated  Affect Appropriate to circumstance  Speech Logical/coherent  Interaction Assertive  Motor Activity Slow  Appearance/Hygiene Unremarkable  Behavior Characteristics Cooperative;Appropriate to situation  Mood Pleasant  Thought Process  Coherency WDL  Content WDL  Delusions None reported or observed  Perception WDL  Hallucination None reported or observed  Judgment Poor  Confusion None  Danger to Self  Current suicidal ideation? Denies  Danger to Others  Danger to Others None reported or observed
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
## 2024-03-22 NOTE — Progress Notes (Signed)
   03/22/24 0900  Psych Admission Type (Psych Patients Only)  Admission Status Voluntary  Psychosocial Assessment  Patient Complaints None  Eye Contact Fair  Facial Expression Animated  Affect Appropriate to circumstance  Speech Logical/coherent  Interaction Assertive  Motor Activity Slow  Appearance/Hygiene Unremarkable;In scrubs  Behavior Characteristics Cooperative  Mood Pleasant;Euthymic  Thought Process  Coherency WDL  Content WDL  Delusions None reported or observed  Perception WDL  Hallucination None reported or observed  Judgment Impaired  Confusion None  Danger to Self  Current suicidal ideation? Denies  Danger to Others  Danger to Others None reported or observed

## 2024-03-22 NOTE — Progress Notes (Signed)
 Patient ID: [name], male   DOB: September 30, 1971, 53 y.o.   MRN: [id number] Nurs Discharge Note:   D: Patient denies SI/HI/AVH at this time. Pt appears calm and cooperative, and no distress noted.   A: All Personal items in locker returned to pt. Pt given AVS/ SRA / BH Transition/ Suicide plan/ [employer]. Pt escorted to the lobby and voucher presented to [facility].   R:  Pt States he will comply with outpatient / other services, and  take MEDS as prescribed.

## 2024-03-22 NOTE — Plan of Care (Signed)
   Problem: Education: Goal: Ability to state activities that reduce stress will improve Outcome: Progressing   Problem: Coping: Goal: Ability to identify and develop effective coping behavior will improve Outcome: Progressing

## 2024-03-22 NOTE — Group Note (Signed)
 Recreation Therapy Group Note   Group Topic:Animal Assisted Therapy   Group Date: 03/22/2024 Start Time: 1610 End Time: 1035 Facilitators: Nevea Spiewak-McCall, LRT,CTRS Location: [address] Dayroom   Animal-Assisted Activity (AAA) Program Checklist/Progress Notes Patient Eligibility Criteria Checklist & Daily Group note for Rec Tx Intervention  AAA/T Program Assumption of Risk Form signed by Patient/ or Parent Legal Guardian Yes  Patient is free of allergies or severe asthma Yes  Patient reports no fear of animals Yes  Patient reports no history of cruelty to animals Yes  Patient understands his/her participation is voluntary Yes  Patient washes hands before animal contact Yes  Patient washes hands after animal contact Yes  Behavioral Response: Engaged   Education: Charity fundraiser, Appropriate Animal Interaction   Education Outcome: Acknowledges education.    Affect/Mood: Appropriate   Participation Level: Engaged   Participation Quality: Independent   Behavior: Appropriate   Speech/Thought Process: Focused   Insight: Good   Judgement: Good   Modes of Intervention: Teaching laboratory technician   Patient Response to Interventions:  Engaged   Education Outcome:  In group clarification offered    Clinical Observations/Individualized Feedback: Patient attended session and interacted appropriately with therapy dog and peers. Patient asked appropriate questions about therapy dog and his training. Patient shared stories about their pets at home with group.    Plan: Continue to engage patient in RT group sessions 2-3x/week.   Laneka Mcgrory-McCall, LRT,CTRS 03/22/2024 1:25 PM

## 2024-03-22 NOTE — Transportation (Signed)
 03/22/2024  [name] DOB: [birth year] MRN: [id number]   RIDER WAIVER AND RELEASE OF LIABILITY  For the purposes of helping with transportation needs, [facility] partners with outside transportation providers (taxi companies, [place], Catering manager.) to give [facility] patients or other approved people the choice of on-demand rides Public librarian) to our buildings for non-emergency visits.  By using [employer], I, the person signing this document, on behalf of myself and/or any legal minors (in my care using the [employer]), agree:  Science writer given to me are supplied by independent, outside transportation providers who do not work for, or have any affiliation with, [employer]. [place] is not a transportation company. [facility] [facility] has no control over the quality or safety of the rides I get using [employer]. [facility] has no control over whether any outside ride will happen on time or not. [facility] gives no guarantee on the reliability, quality, safety, or availability on any rides, or that no mistakes will happen. I know and accept that traveling by vehicle (car, truck, SVU, [place] Chi, bus, taxi, etc.) has risks of serious injuries such as disability, being paralyzed, and death. I know and agree the risk of using [employer] is mine alone, and not [employer]. [employer] are provided as is and as are available. The transportation providers are in charge for all inspections and care of the vehicles used to provide these rides. I agree not to take legal action against [facility], its agents, employees, officers, directors, representatives, insurers, attorneys, assigns, successors, subsidiaries, and affiliates at any time for any reasons related directly or indirectly to using [employer]. I also agree not to take legal action against [place] or its affiliates for any injury, death, or damage to property caused by or related to using  [employer]. I have read this Waiver and Release of Liability, and I understand the terms used in it and their legal meaning. This Waiver is freely and voluntarily given with the understanding that my right (or any legal minors) to legal action against [place] relating to [employer] is knowingly given up to use these services.   I attest that I read the Ride Waiver and Release of Liability to [name], gave Mr. Saling the opportunity to ask questions and answered the questions asked (if any). I affirm that [name] then provided consent for assistance with transportation.

## 2024-03-22 NOTE — Group Note (Signed)
 Date:  03/22/2024 Time:  10:54 AM  Group Topic/Focus:  Coping With Mental Health Crisis:   The purpose of this group is to help patients identify strategies for coping with mental health crisis.  Group discusses possible causes of crisis and ways to manage them effectively. Goals Group:   The focus of this group is to help patients establish daily goals to achieve during treatment and discuss how the patient can incorporate goal setting into their daily lives to aide in recovery. Orientation:   The focus of this group is to educate the patient on the purpose and policies of crisis stabilization and provide a format to answer questions about their admission.  The group details unit policies and expectations of patients while admitted.    Participation Level:  Active  Participation Quality:  Appropriate  Affect:  Appropriate  Cognitive:  Appropriate  Insight: Good  Engagement in Group:  Engaged  Modes of Intervention:  Discussion  Additional Comments:  Pt goal is to work on his discharge plan.   [name] 03/22/2024, 10:54 AM

## 2024-03-22 NOTE — Group Note (Signed)
 LCSW Group Therapy Note   Group Date: 03/22/2024 Start Time: 1100 End Time: 1200  Participation:  did not attend  Type of Therapy:  Group Therapy  Topic:  Understanding Your Path to Change  Objective:  The goal is to help individuals understand the stages of change, identify where they currently are in the process, and provide actionable next steps to continue moving forward in their journey of change.  Goals: Learn about the six stages of change:  Precontemplation, Contemplation, Preparation, Action, Maintenance, and Relapse Reflect on Current Change Efforts:  Recognize which stage participants are in regarding a personal change. Plan Next Steps for Moving Forward:  Create an action plan based on their current stage of change.  Class Summary:  In this session, we explored the Stages of Change as a framework to understand the process of change.  We discussed how each stage helps individuals recognize where they are in their personal journey and used the Stages of Change Worksheet for self-reflection. Participants answered questions to better understand their current stage, challenges, and progress. We also emphasized the importance of moving forward, even if setbacks (Relapse) occur, and created actionable steps to help participants continue progressing. By the end of the session, participants gained a clearer understanding of their path to change and left with a clear plan for next steps.  Therapeutic Modalities:  Elements of CBT (cognitive restructuring, problem solving)  Element of DBT (mindfulness, distress tolerance)   Scottlyn Mchaney O Jodey Burbano, LCSWA 03/22/2024  12:29 PM

## 2024-03-22 NOTE — BHH Suicide Risk Assessment (Signed)
 [facility] Discharge Suicide Risk Assessment   Principal Problem: Alcohol  use disorder, severe, dependence (HCC) Discharge Diagnoses: Principal Problem:   Alcohol  use disorder, severe, dependence (HCC) Active Problems:   PTSD (post-traumatic stress disorder)   Total Time spent with patient: 30 minutes  Musculoskeletal: Strength & Muscle Tone: within normal limits Gait & Station: normal Patient leans: N/A  Psychiatric Specialty Exam  Presentation  General Appearance and behavior:  Casually dressed, not in any distress, appropriate behavior, engaged politely.  No EPS.  Eye Contact: Good.  Speech: Spontaneous.  Normal rate, tone and volume.  Normal prosody of speech.  Mood and Affect  Mood: Euthymic.  Affect: Full range and appropriate.  Thought Process  Thought Processes: Linear and goal directed.  Descriptions of Associations:Intact  Orientation:Full (Time, Place and Person)  Thought Content: Future oriented.  No current suicidal thoughts.  No homicidal thoughts.  No thoughts of violence.  No negative ruminative flooding.  No guilty ruminations.  No delusional theme.  No obsessions.  Hallucinations: No hallucination in any modality.  Sensorium  Memory: Good.  Judgment: Good.  Insight: Good  Executive Functions  Concentration: Good.  Attention Span: Good.  Recall: Good.  Fund of Knowledge: Good.  Language: Good   Psychomotor Activity  Normal psychomotor activity  Physical Exam: Physical Exam ROS Blood pressure 122/81, pulse 90, temperature 99.6 F (37.6 C), temperature source Oral, resp. rate 18, height 5' 8 (1.727 m), weight 74.8 kg, SpO2 97%. Body mass index is 25.07 kg/m.  Mental Status Per Nursing Assessment::   On Admission:  NA  Demographic Factors:  Male, Divorced or widowed, and Living alone  Loss Factors: NA  Historical Factors: Family history of mental illness or substance abuse  Risk Reduction Factors:   Sense of  responsibility to family, Positive social support, Positive therapeutic relationship, and Positive coping skills or problem solving skills  Continued Clinical Symptoms:  Alcohol /Substance Abuse/Dependencies  Cognitive Features That Contribute To Risk:  None    Suicide Risk:  Minimal: No current suicidal ideation.  No current homicidal thoughts.  No thoughts of violence.  Modifiable risk factor targeted during this admission as alcohol  withdrawal.  Patient has completely detoxed from alcohol .  He has good support structure in the community.  There are no new psychosocial stressors. Patient is stable for care at the lower setting.  Follow-up Information     [facility] up on 03/25/2024.   Why: You have a nurse call visit on  03/25/24 at 1:00 pm.  You have a hospital follow up appointment on 04/12/24 at 1:30 pm, to obtain therapy and medication management services, in person. Contact information: [address] [facility] [place] [address] [phone]                 Plan Of Care/Follow-up recommendations:  See discharge summary  Amelie Jury, MD 03/22/2024, 8:41 AM

## 2024-04-13 ENCOUNTER — Encounter (HOSPITAL_COMMUNITY): Payer: Self-pay

## 2024-04-13 ENCOUNTER — Other Ambulatory Visit: Payer: Self-pay

## 2024-04-13 ENCOUNTER — Emergency Department (HOSPITAL_COMMUNITY)
Admission: EM | Admit: 2024-04-13 | Discharge: 2024-04-14 | Disposition: A | Discharge status: Home / Self Care | Attending: Emergency Medicine | Admitting: Emergency Medicine

## 2024-04-13 DIAGNOSIS — Y908 Blood alcohol level of 240 mg/100 ml or more: Secondary | ICD-10-CM | POA: Diagnosis not present

## 2024-04-13 DIAGNOSIS — F4310 Post-traumatic stress disorder, unspecified: Secondary | ICD-10-CM | POA: Insufficient documentation

## 2024-04-13 DIAGNOSIS — R45851 Suicidal ideations: Secondary | ICD-10-CM | POA: Insufficient documentation

## 2024-04-13 DIAGNOSIS — F10129 Alcohol abuse with intoxication, unspecified: Secondary | ICD-10-CM | POA: Insufficient documentation

## 2024-04-13 DIAGNOSIS — F1010 Alcohol abuse, uncomplicated: Secondary | ICD-10-CM

## 2024-04-13 LAB — CBC WITH DIFFERENTIAL/PLATELET
Abs Immature Granulocytes: 0.01 K/uL (ref 0.00–0.07)
Basophils Absolute: 0 K/uL (ref 0.0–0.1)
Basophils Relative: 1 %
Eosinophils Absolute: 0.1 K/uL (ref 0.0–0.5)
Eosinophils Relative: 1 %
HCT: 47.8 % (ref 39.0–52.0)
Hemoglobin: 16 g/dL (ref 13.0–17.0)
Immature Granulocytes: 0 %
Lymphocytes Relative: 53 %
Lymphs Abs: 3.9 K/uL (ref 0.7–4.0)
MCH: 26.9 pg (ref 26.0–34.0)
MCHC: 33.5 g/dL (ref 30.0–36.0)
MCV: 80.5 fL (ref 80.0–100.0)
Monocytes Absolute: 0.5 K/uL (ref 0.1–1.0)
Monocytes Relative: 7 %
Neutro Abs: 2.7 K/uL (ref 1.7–7.7)
Neutrophils Relative %: 38 %
Platelets: 269 K/uL (ref 150–400)
RBC: 5.94 MIL/uL — ABNORMAL HIGH (ref 4.22–5.81)
RDW: 14.6 % (ref 11.5–15.5)
WBC: 7.3 K/uL (ref 4.0–10.5)
nRBC: 0 % (ref 0.0–0.2)

## 2024-04-13 LAB — SALICYLATE LEVEL: Salicylate Lvl: <7 mg/dL — ABNORMAL LOW (ref 7.0–30.0)

## 2024-04-13 LAB — RAPID URINE DRUG SCREEN, HOSP PERFORMED
Amphetamines: NOT DETECTED
Barbiturates: NOT DETECTED
Benzodiazepines: NOT DETECTED
Cocaine: NOT DETECTED
Opiates: NOT DETECTED
Tetrahydrocannabinol: POSITIVE — AB

## 2024-04-13 LAB — COMPREHENSIVE METABOLIC PANEL WITH GFR
ALT: 27 U/L (ref 0–44)
AST: 32 U/L (ref 15–41)
Albumin: 4.7 g/dL (ref 3.5–5.0)
Alkaline Phosphatase: 57 U/L (ref 38–126)
Anion gap: 13 (ref 5–15)
BUN: 9 mg/dL (ref 6–20)
CO2: 25 mmol/L (ref 22–32)
Calcium: 8.7 mg/dL — ABNORMAL LOW (ref 8.9–10.3)
Chloride: 102 mmol/L (ref 98–111)
Creatinine, Ser: 0.69 mg/dL (ref 0.61–1.24)
GFR, Estimated: >60 mL/min (ref >60)
Glucose, Bld: 102 mg/dL — ABNORMAL HIGH (ref 70–99)
Potassium: 3.6 mmol/L (ref 3.5–5.1)
Sodium: 140 mmol/L (ref 135–145)
Total Bilirubin: 1 mg/dL (ref 0.0–1.2)
Total Protein: 8.3 g/dL — ABNORMAL HIGH (ref 6.5–8.1)

## 2024-04-13 LAB — ETHANOL: Alcohol, Ethyl (B): 370 mg/dL (ref <15)

## 2024-04-13 LAB — ACETAMINOPHEN LEVEL: Acetaminophen (Tylenol), Serum: <10 ug/mL — ABNORMAL LOW (ref 10–30)

## 2024-04-13 LAB — MAGNESIUM: Magnesium: 2.3 mg/dL (ref 1.7–2.4)

## 2024-04-13 MED ORDER — LORAZEPAM 1 MG PO TABS: 0.0000 mg | ORAL_TABLET | Freq: Two times a day (BID) | ORAL | Status: DC

## 2024-04-13 MED ORDER — FOLIC ACID 1 MG PO TABS
1.0000 mg | ORAL_TABLET | Freq: Every day | ORAL | Status: DC
  Administered 2024-04-13: 1 mg via ORAL
  Filled 2024-04-13: qty 1

## 2024-04-13 MED ORDER — HALOPERIDOL LACTATE 5 MG/ML IJ SOLN
2.5000 mg | Freq: Once | INTRAMUSCULAR | Status: AC
  Administered 2024-04-13: 2.5 mg via INTRAMUSCULAR
  Filled 2024-04-13: qty 1

## 2024-04-13 MED ORDER — DIPHENHYDRAMINE HCL 25 MG PO CAPS
25.0000 mg | ORAL_CAPSULE | Freq: Once | ORAL | Status: DC
  Filled 2024-04-13: qty 1

## 2024-04-13 MED ORDER — LORAZEPAM 1 MG PO TABS
0.0000 mg | ORAL_TABLET | Freq: Four times a day (QID) | ORAL | Status: DC
  Administered 2024-04-14: 3 mg via ORAL
  Filled 2024-04-13: qty 3

## 2024-04-13 MED ORDER — THIAMINE HCL 100 MG/ML IJ SOLN
100.0000 mg | Freq: Every day | INTRAMUSCULAR | Status: DC
Start: 2024-04-13 — End: 2024-04-14

## 2024-04-13 MED ORDER — LACTATED RINGERS IV BOLUS
1000.0000 mL | Freq: Once | INTRAVENOUS | Status: AC
  Administered 2024-04-13: 1000 mL via INTRAVENOUS

## 2024-04-13 MED ORDER — THIAMINE MONONITRATE 100 MG PO TABS
100.0000 mg | ORAL_TABLET | Freq: Every day | ORAL | Status: DC
  Administered 2024-04-13: 100 mg via ORAL
  Filled 2024-04-13: qty 1

## 2024-04-13 MED ORDER — ADULT MULTIVITAMIN W/MINERALS CH
1.0000 | ORAL_TABLET | Freq: Every day | ORAL | Status: DC
  Administered 2024-04-13: 1 via ORAL
  Filled 2024-04-13: qty 1

## 2024-04-13 MED ORDER — LORAZEPAM 2 MG/ML IJ SOLN
2.0000 mg | Freq: Once | INTRAMUSCULAR | Status: AC
  Administered 2024-04-13: 2 mg via INTRAVENOUS
  Filled 2024-04-13: qty 1

## 2024-04-13 NOTE — ED Notes (Signed)
 Urine obtained sent to lab, pending order

## 2024-04-13 NOTE — ED Provider Notes (Incomplete)
 Care of patient received from prior provider at 11:13 PM, please see their note for complete H/P and care plan.  Received handoff per ED course.  Clinical Course as of 04/14/24 0600  Wed Apr 13, 2024  2313 Signed out to Dr Jerral [RP]  2313 Grossly intoxicated and aggressive.  IVC'd by prior team. [CC]    Clinical Course User Index [CC] Jerral Meth, MD [RP] Dennis Lamar BROCKS, MD   Placed into psychiatric hold protocol.    Jerral Meth, MD 04/14/24 0601  Please call back to patient's bedside.  He states that he has metabolized his intoxication from last night, is regretful about his behavior and states that he does not want to hurt himself.  States he wants to follow-up with psychiatric providers in the outpatient setting for substance use treatment in the outpatient setting. He is ambulatory tolerating p.o. intake and states he feels comfortable with discharge at this time. He understood risk of progression to withdrawal syndrome but stated he did not want any further treatment at this time.   Jerral Meth, MD 04/14/24 [phone]

## 2024-04-13 NOTE — ED Triage Notes (Signed)
 Pt states that he is going to kill himself. Pt tearful, apologizing. Pt states that he has PTSD.

## 2024-04-13 NOTE — ED Provider Notes (Signed)
 [place] EMERGENCY DEPARTMENT AT [facility] Provider Note   CSN: [id number] Arrival date & time: 04/13/24  1506     Patient presents with: Suicidal   [name] is a 53 y.o. male.   53 year old male with history of alcohol  abuse, bipolar, depression, and PTSD presents emergency department suicidal ideations.  Patient reports that he is having thoughts of killing himself.  Says he does have a plan.  Does not want to elaborate on this.  Says that he does have PTSD and feels very sad but does not want to elaborate on why but says it has been worse over the past few days.  Will not disclose last time he used alcohol  or drugs       Prior to Admission medications   Medication Sig Start Date End Date Taking? Authorizing Provider  gabapentin  (NEURONTIN ) 300 MG capsule Take 1 capsule (300 mg total) by mouth 3 (three) times daily. 03/22/24 04/21/24  Hinda Jerrell LABOR, MD    Allergies: Effexor [venlafaxine] and Paroxetine    Review of Systems  Updated Vital Signs BP (!) 172/75 (BP Location: Left Arm)   Pulse 96   Temp 98.2 F (36.8 C) (Oral)   Resp 16   SpO2 96%   Physical Exam Vitals and nursing note reviewed.  Constitutional:      General: He is not in acute distress.    Appearance: He is well-developed.     Comments: Tearful during exam.  Appears to be intoxicated.  HENT:     Head: Normocephalic and atraumatic.     Right Ear: External ear normal.     Left Ear: External ear normal.     Nose: Nose normal.  Eyes:     Extraocular Movements: Extraocular movements intact.     Conjunctiva/sclera: Conjunctivae normal.     Pupils: Pupils are equal, round, and reactive to light.  Cardiovascular:     Rate and Rhythm: Normal rate and regular rhythm.     Heart sounds: Normal heart sounds.  Pulmonary:     Effort: Pulmonary effort is normal. No respiratory distress.     Breath sounds: Normal breath sounds.  Musculoskeletal:     Cervical back: Normal range of  motion and neck supple.     Right lower leg: No edema.     Left lower leg: No edema.  Skin:    General: Skin is warm and dry.  Neurological:     Mental Status: He is alert. Mental status is at baseline.     Comments: No tremors or tongue fasciculations noted  Psychiatric:        Mood and Affect: Mood normal.        Behavior: Behavior normal.     (all labs ordered are listed, but only abnormal results are displayed) Labs Reviewed  COMPREHENSIVE METABOLIC PANEL WITH GFR - Abnormal; Notable for the following components:      Result Value   Glucose, Bld 102 (*)    Calcium 8.7 (*)    Total Protein 8.3 (*)    All other components within normal limits  ETHANOL - Abnormal; Notable for the following components:   Alcohol , Ethyl (B) 370 (*)    All other components within normal limits  RAPID URINE DRUG SCREEN, HOSP PERFORMED - Abnormal; Notable for the following components:   Tetrahydrocannabinol POSITIVE (*)    All other components within normal limits  CBC WITH DIFFERENTIAL/PLATELET - Abnormal; Notable for the following components:   RBC 5.94 (*)  All other components within normal limits  SALICYLATE LEVEL - Abnormal; Notable for the following components:   Salicylate Lvl <7.0 (*)    All other components within normal limits  ACETAMINOPHEN  LEVEL - Abnormal; Notable for the following components:   Acetaminophen  (Tylenol ), Serum <10 (*)    All other components within normal limits  MAGNESIUM     EKG: EKG Interpretation Date/Time:  Wednesday April 13 2024 16:57:38 EDT Ventricular Rate:  100 PR Interval:  156 QRS Duration:  80 QT Interval:  324 QTC Calculation: 417 R Axis:   16  Text Interpretation: Normal sinus rhythm Normal ECG When compared with ECG of 19-Mar-2024 12:27, PREVIOUS ECG IS PRESENT Confirmed by Yolande Charleston [phone]) on 04/13/2024 5:39:35 PM  Radiology: No results found.   Procedures   Medications Ordered in the ED  diphenhydrAMINE  (BENADRYL ) capsule 25  mg (25 mg Oral Patient Refused/Not Given 04/13/24 1747)  thiamine  (VITAMIN B1) tablet 100 mg (100 mg Oral Given by Other 04/13/24 2247)    Or  thiamine  (VITAMIN B1) injection 100 mg ( Intravenous See Alternative 04/13/24 2247)  folic acid  (FOLVITE ) tablet 1 mg (1 mg Oral Given by Other 04/13/24 2247)  multivitamin with minerals tablet 1 tablet (1 tablet Oral Given by Other 04/13/24 2247)  LORazepam  (ATIVAN ) tablet 0-4 mg ( Oral Not Given 04/13/24 2119)    Followed by  LORazepam  (ATIVAN ) tablet 0-4 mg (has no administration in time range)  haloperidol  lactate (HALDOL ) injection 2.5 mg (2.5 mg Intramuscular Given 04/13/24 1744)  lactated ringers  bolus 1,000 mL (0 mLs Intravenous Stopped 04/13/24 2314)  LORazepam  (ATIVAN ) injection 2 mg (2 mg Intravenous Given 04/13/24 1926)    Clinical Course as of 04/13/24 2341  Wed Apr 13, 2024  2313 Signed out to Dr Jerral [RP]  2313 Grossly intoxicated and aggressive.  IVC'd. Would be cleared for DC if recants.  [CC]    Clinical Course User Index [CC] Jerral Meth, MD [RP] Yolande Charleston BROCKS, MD                                 Medical Decision Making Amount and/or Complexity of Data Reviewed Labs: ordered.  Risk OTC drugs. Prescription drug management.   53 year old male with history of alcohol  abuse, bipolar, depression, and PTSD presents emergency department suicidal ideations.   Initial Ddx:  Intoxication, withdrawal, suicidal ideations  MDM/Course:  Patient presents emergency department with suicidal ideations.  Appears to be drunk at this time.  Does not currently appear to be in withdrawals.  Patient was getting agitated pacing up and down the halls and getting out of the stretcher frequently.  Received Haldol  and Ativan  to help him calm down.  Lab work returned and blood alcohol  level was 370.  Also has marijuana in the system.  The remainder of his lab work was unremarkable.  Upon re-evaluation patient is calm down.  At 1 point in time he  was asking nursing to leave.  Unclear if he is intoxicated and making these very vague suicidal comments because of that or if he truly has having suicidal ideations.  Signed out to the oncoming provider awaiting repeat assessment when he becomes clinically sober.  Placed on CIWA protocol. IVC filed.   This patient presents to the ED for concern of complaints listed in HPI, this involves an extensive number of treatment options, and is a complaint that carries with it a high risk of complications and morbidity.  Disposition including potential need for admission considered.   Dispo: Pending remainder of workup  Records reviewed Outpatient Clinic Notes The following labs were independently interpreted: Chemistry and show no acute abnormality I personally reviewed and interpreted the pt's EKG: see above for interpretation  I have reviewed the patients home medications and made adjustments as needed Social Determinants of health:  Etoh abuse  Portions of this note were generated with Scientist, clinical (histocompatibility and immunogenetics). Dictation errors may occur despite best attempts at proofreading.     Final diagnoses:  Alcohol  abuse  Suicidal ideations    ED Discharge Orders     None          Yolande Lamar BROCKS, MD 04/13/24 2341

## 2024-04-14 MED ORDER — LORAZEPAM 1 MG PO TABS
2.0000 mg | ORAL_TABLET | Freq: Once | ORAL | Status: AC
  Administered 2024-04-14: 2 mg via ORAL
  Filled 2024-04-14: qty 2

## 2024-04-14 NOTE — ED Provider Notes (Signed)
 Provider Suicide Risk Assessment Note  Nursing Documentation C-SSRS RISK CATEGORY: High Risk  ED Suicide Bundle Interventions (Must be higher or equal to C-SSRS Score): High Risk Interventions implemented 1:1 monitoring intiated  Suicide Risk Assessment:   Based on my clinical evaluation, I estimate the patient to be at no risk of self-harm in the current setting. This decision is based on my review of the chart including patient's history and current presentation, interview of the patient, mental status examination, and consideration of suicide risk including evaluating suicidal ideation, plan, intent, suicidal or self-harm behaviors, risk factors, and protective factors.  Patient has following modifiable risk factors for suicide: substance use Patient has following non-modifiable or demographic risk factors for suicide:male gender Patient has the following protective factors against suicide: Access to outpatient mental health care, Supportive family, and Supportive friends Mitigation: Risk for suicide is being addressed by recommendations for: outpatient consult/treatment (low risk)     Jerral Meth, MD 04/14/24 [phone]

## 2024-05-28 ENCOUNTER — Other Ambulatory Visit: Payer: Self-pay

## 2024-05-28 ENCOUNTER — Encounter (HOSPITAL_COMMUNITY): Payer: Self-pay

## 2024-05-28 ENCOUNTER — Ambulatory Visit (HOSPITAL_COMMUNITY)
Admission: EM | Admit: 2024-05-28 | Discharge: 2024-05-28 | Disposition: A | Discharge status: Other Acute Inpatient Hospital | Attending: Nurse Practitioner | Admitting: Nurse Practitioner

## 2024-05-28 ENCOUNTER — Inpatient Hospital Stay (HOSPITAL_COMMUNITY)
Admission: AD | Admit: 2024-05-28 | Discharge: 2024-06-02 | DRG: 882 | Disposition: A | Discharge status: Home / Self Care | Source: Intra-hospital | Attending: Psychiatry | Admitting: Psychiatry

## 2024-05-28 DIAGNOSIS — F1094 Alcohol use, unspecified with alcohol-induced mood disorder: Secondary | ICD-10-CM | POA: Diagnosis present

## 2024-05-28 DIAGNOSIS — Z5982 Transportation insecurity: Secondary | ICD-10-CM

## 2024-05-28 DIAGNOSIS — Z1152 Encounter for screening for COVID-19: Secondary | ICD-10-CM

## 2024-05-28 DIAGNOSIS — F17210 Nicotine dependence, cigarettes, uncomplicated: Secondary | ICD-10-CM | POA: Diagnosis present

## 2024-05-28 DIAGNOSIS — Z9109 Other allergy status, other than to drugs and biological substances: Secondary | ICD-10-CM | POA: Diagnosis not present

## 2024-05-28 DIAGNOSIS — F419 Anxiety disorder, unspecified: Secondary | ICD-10-CM | POA: Diagnosis present

## 2024-05-28 DIAGNOSIS — F1020 Alcohol dependence, uncomplicated: Secondary | ICD-10-CM | POA: Diagnosis present

## 2024-05-28 DIAGNOSIS — F411 Generalized anxiety disorder: Secondary | ICD-10-CM | POA: Insufficient documentation

## 2024-05-28 DIAGNOSIS — F4310 Post-traumatic stress disorder, unspecified: Secondary | ICD-10-CM | POA: Insufficient documentation

## 2024-05-28 DIAGNOSIS — Y908 Blood alcohol level of 240 mg/100 ml or more: Secondary | ICD-10-CM | POA: Diagnosis not present

## 2024-05-28 DIAGNOSIS — F333 Major depressive disorder, recurrent, severe with psychotic symptoms: Secondary | ICD-10-CM | POA: Diagnosis not present

## 2024-05-28 DIAGNOSIS — R45851 Suicidal ideations: Secondary | ICD-10-CM | POA: Insufficient documentation

## 2024-05-28 DIAGNOSIS — Z635 Disruption of family by separation and divorce: Secondary | ICD-10-CM | POA: Diagnosis not present

## 2024-05-28 DIAGNOSIS — Z79899 Other long term (current) drug therapy: Secondary | ICD-10-CM | POA: Diagnosis not present

## 2024-05-28 DIAGNOSIS — T438X6A Underdosing of other psychotropic drugs, initial encounter: Secondary | ICD-10-CM | POA: Diagnosis present

## 2024-05-28 DIAGNOSIS — Z811 Family history of alcohol abuse and dependence: Secondary | ICD-10-CM | POA: Diagnosis not present

## 2024-05-28 DIAGNOSIS — F1024 Alcohol dependence with alcohol-induced mood disorder: Secondary | ICD-10-CM | POA: Diagnosis present

## 2024-05-28 LAB — COMPREHENSIVE METABOLIC PANEL WITH GFR
ALT: 38 U/L (ref 0–44)
AST: 54 U/L — ABNORMAL HIGH (ref 15–41)
Albumin: 4.1 g/dL (ref 3.5–5.0)
Alkaline Phosphatase: 56 U/L (ref 38–126)
Anion gap: 14 (ref 5–15)
BUN: 9 mg/dL (ref 6–20)
CO2: 22 mmol/L (ref 22–32)
Calcium: 8.9 mg/dL (ref 8.9–10.3)
Chloride: 105 mmol/L (ref 98–111)
Creatinine, Ser: 0.81 mg/dL (ref 0.61–1.24)
GFR, Estimated: >60 mL/min (ref >60)
Glucose, Bld: 108 mg/dL — ABNORMAL HIGH (ref 70–99)
Potassium: 3.4 mmol/L — ABNORMAL LOW (ref 3.5–5.1)
Sodium: 141 mmol/L (ref 135–145)
Total Bilirubin: <0.2 mg/dL (ref 0.0–1.2)
Total Protein: 7.6 g/dL (ref 6.5–8.1)

## 2024-05-28 LAB — CBC WITH DIFFERENTIAL/PLATELET
Abs Immature Granulocytes: 0.01 K/uL (ref 0.00–0.07)
Basophils Absolute: 0 K/uL (ref 0.0–0.1)
Basophils Relative: 0 %
Eosinophils Absolute: 0.1 K/uL (ref 0.0–0.5)
Eosinophils Relative: 1 %
HCT: 47.8 % (ref 39.0–52.0)
Hemoglobin: 16.3 g/dL (ref 13.0–17.0)
Immature Granulocytes: 0 %
Lymphocytes Relative: 56 %
Lymphs Abs: 4.2 K/uL — ABNORMAL HIGH (ref 0.7–4.0)
MCH: 27.2 pg (ref 26.0–34.0)
MCHC: 34.1 g/dL (ref 30.0–36.0)
MCV: 79.8 fL — ABNORMAL LOW (ref 80.0–100.0)
Monocytes Absolute: 0.5 K/uL (ref 0.1–1.0)
Monocytes Relative: 7 %
Neutro Abs: 2.7 K/uL (ref 1.7–7.7)
Neutrophils Relative %: 36 %
Platelets: 307 K/uL (ref 150–400)
RBC: 5.99 MIL/uL — ABNORMAL HIGH (ref 4.22–5.81)
RDW: 14.4 % (ref 11.5–15.5)
WBC: 7.6 K/uL (ref 4.0–10.5)
nRBC: 0 % (ref 0.0–0.2)

## 2024-05-28 LAB — URINALYSIS, ROUTINE W REFLEX MICROSCOPIC
Bacteria, UA: NONE SEEN
Bilirubin Urine: NEGATIVE
Glucose, UA: NEGATIVE mg/dL
Ketones, ur: NEGATIVE mg/dL
Leukocytes,Ua: NEGATIVE
Nitrite: NEGATIVE
Protein, ur: 30 mg/dL — AB
Specific Gravity, Urine: 1.008 (ref 1.005–1.030)
pH: 5 (ref 5.0–8.0)

## 2024-05-28 LAB — LIPID PANEL
Cholesterol: 266 mg/dL — ABNORMAL HIGH (ref 0–200)
HDL: 71 mg/dL (ref >40)
LDL Cholesterol: 140 mg/dL — ABNORMAL HIGH (ref 0–99)
Total CHOL/HDL Ratio: 3.7 ratio
Triglycerides: 277 mg/dL — ABNORMAL HIGH (ref <150)
VLDL: 55 mg/dL — ABNORMAL HIGH (ref 0–40)

## 2024-05-28 LAB — POCT URINE DRUG SCREEN - MANUAL ENTRY (I-SCREEN)
POC Amphetamine UR: NOT DETECTED
POC Buprenorphine (BUP): NOT DETECTED
POC Cocaine UR: NOT DETECTED
POC Marijuana UR: POSITIVE — AB
POC Methadone UR: NOT DETECTED
POC Methamphetamine UR: NOT DETECTED
POC Morphine: NOT DETECTED
POC Oxazepam (BZO): NOT DETECTED
POC Oxycodone UR: NOT DETECTED
POC Secobarbital (BAR): NOT DETECTED

## 2024-05-28 LAB — ETHANOL: Alcohol, Ethyl (B): 292 mg/dL — ABNORMAL HIGH (ref <15)

## 2024-05-28 LAB — HEMOGLOBIN A1C
Hgb A1c MFr Bld: 5.1 % (ref 4.8–5.6)
Mean Plasma Glucose: 99.67 mg/dL

## 2024-05-28 LAB — TSH: TSH: 0.508 u[IU]/mL (ref 0.350–4.500)

## 2024-05-28 LAB — MAGNESIUM: Magnesium: 2.1 mg/dL (ref 1.7–2.4)

## 2024-05-28 LAB — SARS CORONAVIRUS 2 BY RT PCR: SARS Coronavirus 2 by RT PCR: NEGATIVE

## 2024-05-28 MED ORDER — MAGNESIUM HYDROXIDE 400 MG/5ML PO SUSP: 30.0000 mL | Freq: Every day | ORAL | Status: DC | PRN

## 2024-05-28 MED ORDER — HALOPERIDOL LACTATE 5 MG/ML IJ SOLN: 5.0000 mg | Freq: Three times a day (TID) | INTRAMUSCULAR | Status: DC | PRN

## 2024-05-28 MED ORDER — ALUM & MAG HYDROXIDE-SIMETH 200-200-20 MG/5ML PO SUSP: 30.0000 mL | ORAL | Status: DC | PRN

## 2024-05-28 MED ORDER — ACETAMINOPHEN 325 MG PO TABS: 650.0000 mg | ORAL_TABLET | Freq: Four times a day (QID) | ORAL | Status: DC | PRN

## 2024-05-28 MED ORDER — LOPERAMIDE HCL 2 MG PO CAPS: 2.0000 mg | ORAL_CAPSULE | ORAL | Status: AC | PRN

## 2024-05-28 MED ORDER — DIPHENHYDRAMINE HCL 50 MG/ML IJ SOLN
50.0000 mg | Freq: Three times a day (TID) | INTRAMUSCULAR | Status: DC | PRN
  Administered 2024-05-28: 50 mg via INTRAMUSCULAR
  Filled 2024-05-28: qty 1

## 2024-05-28 MED ORDER — CHLORDIAZEPOXIDE HCL 25 MG PO CAPS: 25.0000 mg | ORAL_CAPSULE | Freq: Four times a day (QID) | ORAL | Status: DC | PRN

## 2024-05-28 MED ORDER — ONDANSETRON 4 MG PO TBDP
4.0000 mg | ORAL_TABLET | Freq: Four times a day (QID) | ORAL | Status: AC | PRN
  Administered 2024-05-29: 4 mg via ORAL
  Filled 2024-05-28: qty 1

## 2024-05-28 MED ORDER — CHLORDIAZEPOXIDE HCL 25 MG PO CAPS: 25.0000 mg | ORAL_CAPSULE | Freq: Three times a day (TID) | ORAL | Status: DC

## 2024-05-28 MED ORDER — GABAPENTIN 300 MG PO CAPS
300.0000 mg | ORAL_CAPSULE | Freq: Three times a day (TID) | ORAL | Status: DC
  Administered 2024-05-29 – 2024-06-01 (×12): 300 mg via ORAL
  Filled 2024-05-28 (×13): qty 1

## 2024-05-28 MED ORDER — TRAZODONE HCL 50 MG PO TABS: 50.0000 mg | ORAL_TABLET | Freq: Every evening | ORAL | Status: DC | PRN

## 2024-05-28 MED ORDER — DIPHENHYDRAMINE HCL 50 MG/ML IJ SOLN: 50.0000 mg | Freq: Three times a day (TID) | INTRAMUSCULAR | Status: DC | PRN

## 2024-05-28 MED ORDER — CHLORDIAZEPOXIDE HCL 25 MG PO CAPS: 25.0000 mg | ORAL_CAPSULE | ORAL | Status: DC

## 2024-05-28 MED ORDER — LORAZEPAM 2 MG/ML IJ SOLN: 2.0000 mg | Freq: Three times a day (TID) | INTRAMUSCULAR | Status: DC | PRN

## 2024-05-28 MED ORDER — CHLORDIAZEPOXIDE HCL 25 MG PO CAPS
25.0000 mg | ORAL_CAPSULE | Freq: Three times a day (TID) | ORAL | Status: AC
  Administered 2024-05-30 – 2024-05-31 (×3): 25 mg via ORAL
  Filled 2024-05-28 (×3): qty 1

## 2024-05-28 MED ORDER — HALOPERIDOL 5 MG PO TABS: 5.0000 mg | ORAL_TABLET | Freq: Three times a day (TID) | ORAL | Status: DC | PRN

## 2024-05-28 MED ORDER — ACETAMINOPHEN 325 MG PO TABS
650.0000 mg | ORAL_TABLET | Freq: Four times a day (QID) | ORAL | Status: DC | PRN
  Filled 2024-05-28 (×3): qty 2

## 2024-05-28 MED ORDER — ADULT MULTIVITAMIN W/MINERALS CH
1.0000 | ORAL_TABLET | Freq: Every day | ORAL | Status: DC
  Administered 2024-05-29 – 2024-06-02 (×5): 1 via ORAL
  Filled 2024-05-28 (×5): qty 1

## 2024-05-28 MED ORDER — CHLORDIAZEPOXIDE HCL 25 MG PO CAPS
25.0000 mg | ORAL_CAPSULE | Freq: Four times a day (QID) | ORAL | Status: DC
  Administered 2024-05-28 (×2): 25 mg via ORAL
  Filled 2024-05-28 (×2): qty 1

## 2024-05-28 MED ORDER — CHLORDIAZEPOXIDE HCL 25 MG PO CAPS
25.0000 mg | ORAL_CAPSULE | Freq: Four times a day (QID) | ORAL | Status: AC | PRN
  Administered 2024-05-29: 25 mg via ORAL
  Filled 2024-05-28: qty 1

## 2024-05-28 MED ORDER — THIAMINE HCL 100 MG/ML IJ SOLN
100.0000 mg | Freq: Once | INTRAMUSCULAR | Status: AC
  Administered 2024-05-29: 100 mg via INTRAMUSCULAR
  Filled 2024-05-28: qty 2

## 2024-05-28 MED ORDER — GABAPENTIN 300 MG PO CAPS
300.0000 mg | ORAL_CAPSULE | Freq: Three times a day (TID) | ORAL | Status: DC
  Administered 2024-05-28: 300 mg via ORAL
  Filled 2024-05-28: qty 1

## 2024-05-28 MED ORDER — ONDANSETRON 4 MG PO TBDP: 4.0000 mg | ORAL_TABLET | Freq: Four times a day (QID) | ORAL | Status: DC | PRN

## 2024-05-28 MED ORDER — HYDROXYZINE HCL 25 MG PO TABS: 25.0000 mg | ORAL_TABLET | Freq: Four times a day (QID) | ORAL | Status: DC | PRN

## 2024-05-28 MED ORDER — CHLORDIAZEPOXIDE HCL 25 MG PO CAPS: 25.0000 mg | ORAL_CAPSULE | Freq: Every day | ORAL | Status: DC

## 2024-05-28 MED ORDER — HYDROXYZINE HCL 25 MG PO TABS
25.0000 mg | ORAL_TABLET | Freq: Three times a day (TID) | ORAL | Status: DC | PRN
  Filled 2024-05-28 (×3): qty 1

## 2024-05-28 MED ORDER — HALOPERIDOL LACTATE 5 MG/ML IJ SOLN
10.0000 mg | Freq: Three times a day (TID) | INTRAMUSCULAR | Status: DC | PRN
  Administered 2024-05-28: 10 mg via INTRAMUSCULAR
  Filled 2024-05-28: qty 2

## 2024-05-28 MED ORDER — CHLORDIAZEPOXIDE HCL 25 MG PO CAPS
25.0000 mg | ORAL_CAPSULE | Freq: Four times a day (QID) | ORAL | Status: AC
  Administered 2024-05-29 – 2024-05-30 (×5): 25 mg via ORAL
  Filled 2024-05-28 (×5): qty 1

## 2024-05-28 MED ORDER — HYDROXYZINE HCL 25 MG PO TABS
25.0000 mg | ORAL_TABLET | Freq: Four times a day (QID) | ORAL | Status: AC | PRN
  Administered 2024-05-30: 25 mg via ORAL

## 2024-05-28 MED ORDER — TRAZODONE HCL 50 MG PO TABS
50.0000 mg | ORAL_TABLET | Freq: Every evening | ORAL | Status: DC | PRN
  Filled 2024-05-28: qty 1

## 2024-05-28 MED ORDER — LORAZEPAM 2 MG/ML IJ SOLN
2.0000 mg | Freq: Three times a day (TID) | INTRAMUSCULAR | Status: DC | PRN
  Administered 2024-05-28: 2 mg via INTRAMUSCULAR
  Filled 2024-05-28: qty 1

## 2024-05-28 MED ORDER — CHLORDIAZEPOXIDE HCL 25 MG PO CAPS
25.0000 mg | ORAL_CAPSULE | ORAL | Status: AC
  Administered 2024-05-31 – 2024-06-01 (×2): 25 mg via ORAL
  Filled 2024-05-28 (×2): qty 1

## 2024-05-28 MED ORDER — DIPHENHYDRAMINE HCL 25 MG PO CAPS: 50.0000 mg | ORAL_CAPSULE | Freq: Three times a day (TID) | ORAL | Status: DC | PRN

## 2024-05-28 MED ORDER — ADULT MULTIVITAMIN W/MINERALS CH
1.0000 | ORAL_TABLET | Freq: Every day | ORAL | Status: DC
  Administered 2024-05-28: 1 via ORAL
  Filled 2024-05-28: qty 1

## 2024-05-28 MED ORDER — HYDROXYZINE HCL 25 MG PO TABS: 25.0000 mg | ORAL_TABLET | Freq: Three times a day (TID) | ORAL | Status: DC | PRN

## 2024-05-28 MED ORDER — LOPERAMIDE HCL 2 MG PO CAPS: 2.0000 mg | ORAL_CAPSULE | ORAL | Status: DC | PRN

## 2024-05-28 MED ORDER — TRAZODONE HCL 50 MG PO TABS
50.0000 mg | ORAL_TABLET | Freq: Every evening | ORAL | Status: DC | PRN
  Administered 2024-05-29: 50 mg via ORAL
  Filled 2024-05-28 (×2): qty 1

## 2024-05-28 MED ORDER — HYDROXYZINE HCL 25 MG PO TABS
25.0000 mg | ORAL_TABLET | Freq: Three times a day (TID) | ORAL | Status: DC | PRN
  Administered 2024-05-29 – 2024-06-01 (×3): 25 mg via ORAL
  Filled 2024-05-28 (×3): qty 1

## 2024-05-28 MED ORDER — CHLORDIAZEPOXIDE HCL 25 MG PO CAPS
25.0000 mg | ORAL_CAPSULE | Freq: Every day | ORAL | Status: DC
  Filled 2024-05-28: qty 1

## 2024-05-28 MED ORDER — ACETAMINOPHEN 325 MG PO TABS
650.0000 mg | ORAL_TABLET | Freq: Four times a day (QID) | ORAL | Status: DC | PRN
  Administered 2024-05-29 – 2024-06-01 (×3): 650 mg via ORAL

## 2024-05-28 MED ORDER — DIPHENHYDRAMINE HCL 50 MG PO CAPS: 50.0000 mg | ORAL_CAPSULE | Freq: Three times a day (TID) | ORAL | Status: DC | PRN

## 2024-05-28 MED ORDER — HALOPERIDOL LACTATE 5 MG/ML IJ SOLN: 10.0000 mg | Freq: Three times a day (TID) | INTRAMUSCULAR | Status: DC | PRN

## 2024-05-28 MED ORDER — THIAMINE HCL 100 MG/ML IJ SOLN
100.0000 mg | Freq: Once | INTRAMUSCULAR | Status: AC
  Administered 2024-05-28: 100 mg via INTRAMUSCULAR
  Filled 2024-05-28: qty 2
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
## 2024-05-28 NOTE — Progress Notes (Signed)
   05/28/24 1641  [facility] Triage Screening (Walk-ins at [facility] only)  How Did You Hear About Us ? Family/Friend  What Is the Reason for Your Visit/Call Today? PT [name] 53Y male presents to [facility] under IVC. PT is a Cytogeneticist; was in combat. Per IVC the pt has been diagnosed with PTSD, Depression, Bipolar, and alcoholism. PT was recently detoxed; pt has been drinking 35-40 beers for the last five days. PT stated that his last beer was a couple of hours ago. PT endorses SI, HI, AVH and appears under the influence.  How Long Has This Been Causing You Problems? <Week  Have You Recently Had Any Thoughts About Hurting Yourself? Yes  How long ago did you have thoughts about hurting yourself? Today  Are You Planning to Commit Suicide/Harm Yourself At This time? No  Have you Recently Had Thoughts About Hurting Someone Sherral? Yes  How long ago did you have thoughts of harming others? n/a  Are You Planning To Harm Someone At This Time? No  Physical Abuse Denies  Verbal Abuse Denies  Sexual Abuse Denies  Are you currently experiencing any auditory, visual or other hallucinations? Yes  Please explain the hallucinations you are currently experiencing: auditory and visual - possibly related to combat  Have You Used Any Alcohol  or Drugs in the Past 24 Hours? Yes  What Did You Use and How Much? Beer, unknown amount  Clinician description of patient physical appearance/behavior: tearful, shaky, appears under the influence  What Do You Feel Would Help You the Most Today? Alcohol  or Drug Use Treatment;Treatment for Depression or other mood problem;Stress Management;Social Support;Medication(s)  Determination of Need Urgent (48 hours)  Options For Referral Inpatient Hospitalization;Medication Management;Outpatient Therapy;BH Urgent Care;Facility-Based Crisis  Determination of Need filed? Yes
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
## 2024-05-29 DIAGNOSIS — F4310 Post-traumatic stress disorder, unspecified: Principal | ICD-10-CM

## 2024-05-29 DIAGNOSIS — F1024 Alcohol dependence with alcohol-induced mood disorder: Secondary | ICD-10-CM

## 2024-05-29 NOTE — Progress Notes (Signed)
 Pt is a 53 year old male admitted to [facility] IVC for substance abuse and suicidal ideation. Pt has a history of alcohol  abuse, PTSD, and bipolar disorder. Pt was nodding off and falling asleep during admission process and was unable to answer assessment questions. Skin assessment completed with Christy, MHT, and findings were WDL. Pt denies current SI/HI/AVH. Belongings searched according to policy and secured in pt locker. Safety checks initiated at q 15 minutes. Pt remains safe on the unit at this time.

## 2024-05-29 NOTE — Plan of Care (Signed)
 Nurse discussed anxiety, depression and coping skills with patient.

## 2024-05-29 NOTE — Plan of Care (Signed)
   Problem: Education: Goal: Knowledge of [place] [facility] Education information/materials will improve Outcome: Progressing Goal: Emotional status will improve Outcome: Progressing Goal: Mental status will improve Outcome: Progressing

## 2024-05-29 NOTE — Tx Team (Signed)
 Initial Treatment Plan 05/29/2024 2:30 AM [name] [id number]    PATIENT STRESSORS: Substance abuse   Traumatic event     PATIENT STRENGTHS: Capable of independent living  Supportive family/friends  Work skills    PATIENT IDENTIFIED PROBLEMS: Depression   PTSD  Substance abuse                 DISCHARGE CRITERIA:  Improved stabilization in mood, thinking, and/or behavior Motivation to continue treatment in a less acute level of care Verbal commitment to aftercare and medication compliance Withdrawal symptoms are absent or subacute and managed without 24-hour nursing intervention  PRELIMINARY DISCHARGE PLAN: Attend 12-step recovery group Outpatient therapy Return to previous living arrangement Return to previous work or school arrangements  PATIENT/FAMILY INVOLVEMENT: This treatment plan has been presented to and reviewed with the patient, [name].The patient has been given the opportunity to ask questions and make suggestions.  Gunnar Hereford C D'Addio, RN 05/29/2024, 2:30 AM

## 2024-05-29 NOTE — Progress Notes (Signed)
 D:  Patient's self inventory sheet, patient sleeps good, no sleep medication.  Poor appetite, low energy level, poor concentration.  Denied depression, hopeless and anxiety.  Withdrawals, none checked.  Denied SI.  Denied physical problems.  Denied physical pain.  Goal is to feel awake.  No discharge plans.  Will discharge his admission with MD/SW. A:  Medications administered per MD orders.  Emotional support and encouragement given patient. R:  Patient denied SI and HI, contracts for safety.  Denied A/V hallucinations.

## 2024-05-29 NOTE — H&P (Signed)
 Psychiatric Admission Assessment Adult  Patient Identification: [name] MRN:  [id number] Date of Evaluation:  05/29/2024 Chief Complaint:  Alcohol -induced mood disorder (HCC) [F10.94] Principal Diagnosis: PTSD (post-traumatic stress disorder) Diagnosis:  Principal Problem:   PTSD (post-traumatic stress disorder) Active Problems:   Alcohol  use disorder, severe, dependence (HCC)   Alcohol -induced mood disorder (HCC)  History of Present Illness: The patient is a 53 y/o male with a AUD and PTSD and multiple prior ED and hospital visits for alcohol  use who presented to [facility] on 8/23 under police escort after his GF petitioned for IVC for danger to self. At [facility] his BAL was 292. During assessment he is unable to provide any recollection of his behavior while at [facility]. Per NP Nkwenti:  During encounter with patient, he fluctuates between irritability, and tearfulness, and presents that way through entire encounter, he endorses suicidal ideations, reports having a plan, which he refuses to share with Clinical research associate, states that he does not own a gun, but repeatedly motions shooting a gun with his hand as if he is clicking on the barrel of a gun. He repeatedly states to writer you want me to be honest with you?  I should not be alive.  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  Patient denies homicidal ideations, but seems to be reliving the events of his combat days, cries hysterically when question regarding HI is asked, but then states he does not want to hurt anybody else.  He is asked if he is having auditory hallucinations, he denies it, but states that he is having visual hallucinations of death. He is asked what death looks like and cries even more stating what type of question is that?   Patient reports depressive symptoms including anhedonia, feelings of guilt, even though he is unable to state what it is about.  He reports insomnia, talks about not being able to sleep, reports poor  concentration levels, reports a poor appetite, inability to focus, as well as feelings of hopelessness, helplessness, and worthlessness.   Patient reports symptoms consistent with anxiety, including worrying excessively, muscle tension, restlessness, for at least a year now, worsening over the past few days leading to this hospitalization.  On assessment the patient appears guarded and is a poor historian. He reports that since last admission to [facility] in June there has been no significant change in his health status, symptoms, or treatment. He denied receiving any counseling or services through the [facility] and reports that he continues to engage in a binge-type pattern of drinking with periods of sobriety between episodes. He reports that all he recalls of recent events leading to this admission is that the day of admission he was at his house drinking with his GF and that she called the police to report that he was a danger to himself. He denies recalling anything else or making any specific suicidal statements. He reports that he was consuming around 30 drinks/beers daily for a few days leading up to this admission. He reports that his PTSD symptoms are still bad and that he currently is experiencing some mild shakes and chills. He otherwise reports that he is tired and denies SI.    Past Psychiatric History:  Multiple prior psychiatric hospitalizations, to include involuntary admissions for behaviors related to alcohol  use and intoxication. His most recent admission to [facility] was in June 2025 for SI while intoxicated. He has documented allergies to venlafaxine and paroxetine but reports that he has not regularly taken psychotropic  medications for PTSD and does not want to. He denies any history of suicidal behaviors. He has engaged in therapy through the [facility] in the past but is not currently in therapy. He reports 4-5 lifetime stays at an inpatient SUD program over the years. He reports  previously being engaged with AA for about 20 years but stopped due to lack of perceived benefit.    [place] Scale:  Flowsheet Row ED from 05/28/2024 in [facility] ED from 04/13/2024 in [facility] Emergency Department at [facility] Admission (Discharged[facility]  C-SSRS RISK CATEGORY High Risk High Risk No Risk     Past Medical History:  Past Medical History:  Diagnosis Date   Active smoker    Alcohol  abuse    Anxiety    Bipolar 2 disorder (HCC)    Depression    PTSD (post-traumatic stress disorder)     Past Surgical History:  Procedure Laterality Date   HERNIA REPAIR     LYMPH NODE DISSECTION     Family History:  Family History  Problem Relation Age of Onset   Alcohol  abuse Father    Family Psychiatric  History: Denies any known family history  Tobacco Screening:  Social History   Tobacco Use  Smoking Status Every Day   Current packs/day: 2.00   Average packs/day: 2.0 packs/day for 20.0 years (40.0 ttl pk-yrs)   Types: Cigarettes  Smokeless Tobacco Never    BH Tobacco Counseling     Are you interested in Tobacco Cessation Medications?  No, patient refused Counseled patient on smoking cessation:  Refused/Declined practical counseling Reason Tobacco Screening Not Completed: No value filed.       Social History:  Social History   Substance and Sexual Activity  Alcohol  Use Not Currently   Comment: Patient states he is a binge drinker. Last drink 06/09/23     Social History   Substance and Sexual Activity  Drug Use Yes   Types: Marijuana    Additional Social History: Single, divorced, he has one adult son that lives in [place] he states he is close to. He reports having virtually no local support network, though he does have friends. He is not working and reports that he is retired and 100% disabled through the [place] and receives disability and medical benefits.  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 Owns his own home. Loves alone. He enjoys outdoor activities such as fishing.     Allergies:   Allergies  Allergen Reactions   Effexor [Venlafaxine] Hives   Paroxetine Hives and Rash   Lab Results:  Results for orders placed or performed during the hospital encounter of 05/28/24 (from the past 48 hours)  CBC with Differential/Platelet     Status: Abnormal   Collection Time: 05/28/24  5:05 PM  Result Value Ref Range   WBC 7.6 4.0 - 10.5 K/uL   RBC 5.99 (H) 4.22 - 5.81 MIL/uL   Hemoglobin 16.3 13.0 - 17.0 g/dL   HCT 52.1 60.9 - 47.9 %   MCV 79.8 (L) 80.0 - 100.0 fL   MCH 27.2 26.0 - 34.0 pg   MCHC 34.1 30.0 - 36.0 g/dL   RDW 85.5 88.4 - 84.4 %   Platelets 307 150 - 400 K/uL   nRBC 0.0 0.0 - 0.2 %   Neutrophils Relative % 36 %   Neutro Abs 2.7 1.7 - 7.7 K/uL   Lymphocytes  Relative 56 %   Lymphs Abs 4.2 (H) 0.7 - 4.0 K/uL   Monocytes Relative 7 %   Monocytes Absolute 0.5 0.1 - 1.0 K/uL   Eosinophils Relative 1 %   Eosinophils Absolute 0.1 0.0 - 0.5 K/uL   Basophils Relative 0 %   Basophils Absolute 0.0 0.0 - 0.1 K/uL   Immature Granulocytes 0 %   Abs Immature Granulocytes 0.01 0.00 - 0.07 K/uL    Comment: Performed at [facility], [address], [place], [place] [address]  Comprehensive metabolic panel     Status: Abnormal   Collection Time: 05/28/24  5:05 PM  Result Value Ref Range   Sodium 141 135 - 145 mmol/L   Potassium 3.4 (L) 3.5 - 5.1 mmol/L   Chloride 105 98 - 111 mmol/L   CO2 22 22 - 32 mmol/L   Glucose, Bld 108 (H) 70 - 99 mg/dL    Comment: Glucose reference range applies only to samples taken after fasting for at least 8 hours.   BUN 9 6 - 20 mg/dL   Creatinine, Ser 9.18 0.61 - 1.24 mg/dL   Calcium 8.9 8.9 - 89.6 mg/dL   Total Protein 7.6 6.5 - 8.1 g/dL   Albumin 4.1 3.5 - 5.0 g/dL   AST 54 (H) 15 - 41 U/L   ALT 38 0 - 44 U/L   Alkaline Phosphatase 56 38 - 126 U/L   Total Bilirubin  <0.2 0.0 - 1.2 mg/dL   GFR, Estimated >39 >39 mL/min    Comment: (NOTE) Calculated using the CKD-EPI Creatinine Equation (2021)    Anion gap 14 5 - 15    Comment: Performed at [facility], [address], [place], [place] [address]  Hemoglobin A1c     Status: None   Collection Time: 05/28/24  5:05 PM  Result Value Ref Range   Hgb A1c MFr Bld 5.1 4.8 - 5.6 %    Comment: (NOTE) Diagnosis of Diabetes The following HbA1c ranges recommended by the American Diabetes Association (ADA) may be used as an aid in the diagnosis of diabetes mellitus.  Hemoglobin             Suggested A1C NGSP%              Diagnosis  <5.7                   Non Diabetic  5.7-6.4                Pre-Diabetic  >6.4                   Diabetic  <7.0                   Glycemic control for                       adults with diabetes.     Mean Plasma Glucose 99.67 mg/dL    Comment: Performed at [facility], [address], [place], [place] [address]  Magnesium      Status: None   Collection Time: 05/28/24  5:05 PM  Result Value Ref Range   Magnesium  2.1 1.7 - 2.4 mg/dL    Comment: Performed at [facility], [address] [place], [place] [address]  Ethanol     Status: Abnormal   Collection Time: 05/28/24  5:05 PM  Result Value Ref Range   Alcohol , Ethyl (  B) 292 (H) <15 mg/dL    Comment: (NOTE) For medical purposes only. Performed at [facility], [address], [place], [place] [address]   Lipid panel     Status: Abnormal   Collection Time: 05/28/24  5:05 PM  Result Value Ref Range   Cholesterol 266 (H) 0 - 200 mg/dL   Triglycerides 722 (H) <150 mg/dL   HDL 71 >59 mg/dL   Total CHOL/HDL Ratio 3.7 RATIO   VLDL 55 (H) 0 - 40 mg/dL   LDL Cholesterol 859 (H) 0 - 99 mg/dL    Comment:        Total Cholesterol/HDL:CHD Risk Coronary Heart Disease Risk Table                     Men   Women  1/2 Average Risk   3.4   3.3  Average Risk       5.0   4.4  2 X Average Risk   9.6    7.1  3 X Average Risk  23.4   11.0        Use the calculated Patient Ratio above and the CHD Risk Table to determine the patient's CHD Risk.        ATP III CLASSIFICATION (LDL):  <100     mg/dL   Optimal  899-870  mg/dL   Near or Above                    Optimal  130-159  mg/dL   Borderline  839-810  mg/dL   High  >809     mg/dL   Very High Performed at [facility], [address] [place], [place] [address]   TSH     Status: None   Collection Time: 05/28/24  5:05 PM  Result Value Ref Range   TSH 0.508 0.350 - 4.500 uIU/mL    Comment: Performed by a 3rd Generation assay with a functional sensitivity of <=0.01 uIU/mL. Performed at [facility], [address], [place], [place] [address]   Urinalysis, Routine w reflex microscopic -Urine, Clean Catch     Status: Abnormal   Collection Time: 05/28/24  5:07 PM  Result Value Ref Range   Color, Urine YELLOW YELLOW   APPearance CLEAR CLEAR   Specific Gravity, Urine 1.008 1.005 - 1.030   pH 5.0 5.0 - 8.0   Glucose, UA NEGATIVE NEGATIVE mg/dL   Hgb urine dipstick SMALL (A) NEGATIVE   Bilirubin Urine NEGATIVE NEGATIVE   Ketones, ur NEGATIVE NEGATIVE mg/dL   Protein, ur 30 (A) NEGATIVE mg/dL   Nitrite NEGATIVE NEGATIVE   Leukocytes,Ua NEGATIVE NEGATIVE   RBC / HPF 0-5 0 - 5 RBC/hpf   WBC, UA 0-5 0 - 5 WBC/hpf   Bacteria, UA NONE SEEN NONE SEEN   Squamous Epithelial / HPF 0-5 0 - 5 /HPF   Mucus PRESENT     Comment: Performed at [facility], [address] [place], [place] [address]  POCT Urine Drug Screen - (I-Screen)     Status: Abnormal   Collection Time: 05/28/24  5:07 PM  Result Value Ref Range   POC Amphetamine UR None Detected NONE DETECTED (Cut Off Level 1000 ng/mL)   POC Secobarbital (BAR) None Detected NONE DETECTED (Cut Off Level 300 ng/mL)   POC Buprenorphine (BUP) None Detected NONE DETECTED (Cut Off Level 10 ng/mL)   POC Oxazepam (BZO) None Detected NONE DETECTED (Cut Off Level  300 ng/mL)   POC  Cocaine UR None Detected NONE DETECTED (Cut Off Level 300 ng/mL)   POC Methamphetamine UR None Detected NONE DETECTED (Cut Off Level 1000 ng/mL)   POC Morphine None Detected NONE DETECTED (Cut Off Level 300 ng/mL)   POC Methadone UR None Detected NONE DETECTED (Cut Off Level 300 ng/mL)   POC Oxycodone  UR None Detected NONE DETECTED (Cut Off Level 100 ng/mL)   POC Marijuana UR Positive (A) NONE DETECTED (Cut Off Level 50 ng/mL)  SARS Coronavirus 2 by RT PCR (hospital order, performed in [facility] hospital lab) *cepheid single result test* Anterior Nasal Swab     Status: None   Collection Time: 05/28/24  5:45 PM   Specimen: Anterior Nasal Swab  Result Value Ref Range   SARS Coronavirus 2 by RT PCR NEGATIVE NEGATIVE    Comment: Performed at [facility], [address] [place], [place] [address]    Blood Alcohol  level:  Lab Results  Component Value Date   ETH 292 (H) 05/28/2024   ETH 370 (HH) 04/13/2024    Metabolic Disorder Labs:  Lab Results  Component Value Date   HGBA1C 5.1 05/28/2024   MPG 99.67 05/28/2024   No results found for: PROLACTIN Lab Results  Component Value Date   CHOL 266 (H) 05/28/2024   TRIG 277 (H) 05/28/2024   HDL 71 05/28/2024   CHOLHDL 3.7 05/28/2024   VLDL 55 (H) 05/28/2024   LDLCALC 140 (H) 05/28/2024   LDLCALC 141 (H) 10/14/2013    Current Medications: Current Facility-Administered Medications  Medication Dose Route Frequency Provider Last Rate Last Admin   acetaminophen  (TYLENOL ) tablet 650 mg  650 mg Oral Q6H PRN [place] Drilling, NP       acetaminophen  (TYLENOL ) tablet 650 mg  650 mg Oral Q6H PRN Tex Drilling, NP   650 mg at 05/29/24 0912   alum & mag hydroxide-simeth (MAALOX/MYLANTA) 200-200-20 MG/5ML suspension 30 mL  30 mL Oral Q4H PRN [place] Drilling, NP       alum & mag hydroxide-simeth (MAALOX/MYLANTA) 200-200-20 MG/5ML suspension 30 mL  30 mL Oral Q4H PRN Tex Drilling, NP       chlordiazePOXIDE  (LIBRIUM ) capsule 25 mg  25 mg  Oral Q6H PRN Tex Drilling, NP   25 mg at 05/29/24 1556   chlordiazePOXIDE  (LIBRIUM ) capsule 25 mg  25 mg Oral QID Tex Drilling, NP   25 mg at 05/29/24 1153   Followed by   NOREEN ON 05/30/2024] chlordiazePOXIDE  (LIBRIUM ) capsule 25 mg  25 mg Oral TID Tex Drilling, NP       Followed by   NOREEN ON 05/31/2024] chlordiazePOXIDE  (LIBRIUM ) capsule 25 mg  25 mg Oral BH-qamhs Nkwenti, Doris, NP       Followed by   NOREEN ON 06/02/2024] chlordiazePOXIDE  (LIBRIUM ) capsule 25 mg  25 mg Oral Daily Nkwenti, Doris, NP       haloperidol  (HALDOL ) tablet 5 mg  5 mg Oral TID PRN Tex Drilling, NP       And   diphenhydrAMINE  (BENADRYL ) capsule 50 mg  50 mg Oral TID PRN Tex Drilling, NP       haloperidol  lactate (HALDOL ) injection 10 mg  10 mg Intramuscular TID PRN Tex Drilling, NP       And   diphenhydrAMINE  (BENADRYL ) injection 50 mg  50 mg Intramuscular TID PRN Tex Drilling, NP       And   LORazepam  (ATIVAN ) injection 2 mg  2 mg Intramuscular TID PRN Nkwenti,  Doris, NP       haloperidol  lactate (HALDOL ) injection 5 mg  5 mg Intramuscular TID PRN Tex Drilling, NP       And   diphenhydrAMINE  (BENADRYL ) injection 50 mg  50 mg Intramuscular TID PRN Tex Drilling, NP       And   LORazepam  (ATIVAN ) injection 2 mg  2 mg Intramuscular TID PRN Tex Drilling, NP       gabapentin  (NEURONTIN ) capsule 300 mg  300 mg Oral TID Tex Drilling, NP   300 mg at 05/29/24 1153   hydrOXYzine  (ATARAX ) tablet 25 mg  25 mg Oral TID PRN Tex Drilling, NP       hydrOXYzine  (ATARAX ) tablet 25 mg  25 mg Oral TID PRN Tex Drilling, NP       hydrOXYzine  (ATARAX ) tablet 25 mg  25 mg Oral Q6H PRN Nkwenti, Doris, NP       loperamide  (IMODIUM ) capsule 2-4 mg  2-4 mg Oral PRN Tex Drilling, NP       magnesium  hydroxide (MILK OF MAGNESIA) suspension 30 mL  30 mL Oral Daily PRN Nkwenti, Doris, NP       magnesium  hydroxide (MILK OF MAGNESIA) suspension 30 mL  30 mL Oral Daily PRN Tex Drilling, NP       multivitamin with  minerals tablet 1 tablet  1 tablet Oral Daily Tex Drilling, NP   1 tablet at 05/29/24 0750   ondansetron  (ZOFRAN -ODT) disintegrating tablet 4 mg  4 mg Oral Q6H PRN Tex Drilling, NP   4 mg at 05/29/24 1556   traZODone  (DESYREL ) tablet 50 mg  50 mg Oral QHS PRN Tex Drilling, NP       traZODone  (DESYREL ) tablet 50 mg  50 mg Oral QHS PRN Tex Drilling, NP       PTA Medications: Medications Prior to Admission  Medication Sig Dispense Refill Last Dose/Taking   ezetimibe (ZETIA) 10 MG tablet Take 10 mg by mouth daily.   Past Week   folic acid  (FOLVITE ) 1 MG tablet Take 1 mg by mouth daily.   Past Week   thiamine  (VITAMIN B1) 100 MG tablet Take 100 mg by mouth daily.   Past Week    Musculoskeletal: Normal gait and station  Mental Status Exam: Appearance - Wearing hospital scrubs; disheveled Attitude - Disinterested, guarded Speech - normal volume, prosody, inflection Mood - Tired Affect - Restricted Thought Process - Linear, GD Thought Content - No delusional TC expressed SI/HI - Denies Perceptions - Denies; not RIS Judgement/Insight - Limited Fund of knowledge - WNL Language - No impairments   Physical Exam Vitals reviewed.  HENT:     Head: Normocephalic and atraumatic.  Pulmonary:     Effort: Pulmonary effort is normal.  Musculoskeletal:        General: Normal range of motion.    Review of Systems  Constitutional: Negative.   Respiratory: Negative.    Cardiovascular: Negative.    Blood pressure 114/82, pulse 84, temperature 98.3 F (36.8 C), temperature source Oral, resp. rate 12, height 5' 9 (1.753 m), weight 69.1 kg, SpO2 94%. Body mass index is 22.51 kg/m.  Assessment and Plan: Mr. Syler Norcia is a 53 y/o male with a history of previously diagnosed PTSD and severe AUD with multiple hospitalizations over the years for acute intoxication and withdrawal, who was admitted to [facility] for suicidal ideation in the context of acute alcohol  intoxication.  # Alcohol   use disorder, severe # History of complicated withdrawal - Uncontrolled. - Suspect  alcohol  induced mood disorder - CIWA protocol - Patient would benefit from inpatient or outpatient SUD treatment, but is likely to reject this, as he historically has. - Gabapentin  300 mg TID - Chlordiapoxide taper   # PTSD - Severe, uncontrolled - Patient has been avoiding treatment despite being eligible for services at the [facility] - Not interested in medication at this time   Observation Level/Precautions:  15 minute checks  Laboratory:  N/A  Psychotherapy:  Group  Medications:  As above  Consultations:  SW  Discharge Concerns:  Follow-up  Estimated LOS: 3-5 days     Physician Treatment Plan for Primary Diagnosis: PTSD (post-traumatic stress disorder) Long Term Goal(s): Improvement in symptoms so as ready for discharge  Short Term Goals: Ability to identify changes in lifestyle to reduce recurrence of condition will improve, Ability to verbalize feelings will improve, Ability to demonstrate self-control will improve, Ability to identify and develop effective coping behaviors will improve, and Ability to identify triggers associated with substance abuse/mental health issues will improve  Physician Treatment Plan for Secondary Diagnosis: Principal Problem:   PTSD (post-traumatic stress disorder) Active Problems:   Alcohol  use disorder, severe, dependence (HCC)   Alcohol -induced mood disorder (HCC)   I certify that inpatient services furnished can reasonably be expected to improve the patient's condition.    Oliva DELENA Salmon, DO 8/24/20254:17 PM

## 2024-05-29 NOTE — Progress Notes (Signed)
(  Sleep Hours) - 5 (Any PRNs that were needed, meds refused, or side effects to meds)-  none (Any disturbances and when (visitation, over night)- none (Concerns raised by the patient)- none (SI/HI/AVH)- not reported by admitting nurse

## 2024-05-29 NOTE — Plan of Care (Signed)
  Problem: Education: Goal: Knowledge of [facility] Education information/materials will improve 05/29/2024 2323 by Cecille Dan CROME, RN Outcome: Progressing 05/29/2024 2259 by Cecille Dan CROME, RN Outcome: Progressing Goal: Emotional status will improve 05/29/2024 2323 by Cecille Dan CROME, RN Outcome: Progressing 05/29/2024 2259 by Cecille Dan CROME, RN Outcome: Progressing Goal: Mental status will improve 05/29/2024 2323 by Cecille Dan CROME, RN Outcome: Progressing 05/29/2024 2259 by Cecille Dan CROME, RN Outcome: Progressing Goal: Verbalization of understanding the information provided will improve Outcome: Progressing

## 2024-05-29 NOTE — Group Note (Signed)
 Date:  05/29/2024 Time:  8:25 PM  Group Topic/Focus:  Goals Group:   The focus of this group is to help patients establish daily goals to achieve during treatment and discuss how the patient can incorporate goal setting into their daily lives to aide in recovery. Wrap-Up Group:   The focus of this group is to help patients review their daily goal of treatment and discuss progress on daily workbooks.    Participation Level:  Did Not Attend  Participation Quality:    Affect:    Cognitive:    Insight:   Engagement in Group:    Modes of Intervention:    Additional Comments:    Ellouise Dama Molt 05/29/2024, 8:25 PM

## 2024-05-29 NOTE — Group Note (Deleted)
 Date:  05/29/2024 Time:  5:45 PM  Group Topic/Focus:  Emotional Education:   The focus of this group is to discuss what feelings/emotions are, and how they are experienced.     Participation Level:  {BHH PARTICIPATION OZCZO:77735}  Participation Quality:  {BHH PARTICIPATION QUALITY:22265}  Affect:  {BHH AFFECT:22266}  Cognitive:  {BHH COGNITIVE:22267}  Insight: {BHH Insight2:20797}  Engagement in Group:  {BHH ENGAGEMENT IN HMNLE:77731}  Modes of Intervention:  {BHH MODES OF INTERVENTION:22269}  Additional Comments:  ***  [name] 05/29/2024, 5:45 PM

## 2024-05-29 NOTE — BHH Suicide Risk Assessment (Signed)
 [facility] INPATIENT:  Family/Significant Other Suicide Prevention Education  Suicide Prevention Education:  Patient Refusal for Family/Significant Other Suicide Prevention Education: The patient [name] has refused to provide written consent for family/significant other to be provided Family/Significant Other Suicide Prevention Education during admission and/or prior to discharge.  Physician notified.   CSW informed patient of suicide prevention education and gave patient SPE pamphlet. Patient acknowledge understanding of education and is aware of signs and who to reach when crisis occur.    Kiah Keay O Vershawn Westrup 05/29/2024, 5:54 PM

## 2024-05-29 NOTE — Group Note (Signed)
 Date:  05/29/2024 Time:  5:38 PM  Group Topic/Focus:  Emotional Education:   The focus of this group is to discuss what feelings/emotions are, and how they are experienced.    Participation Level:  Did Not Attend  Huel Mall 05/29/2024, 5:38 PM

## 2024-05-29 NOTE — Plan of Care (Signed)
   Problem: Education: Goal: Knowledge of [facility] Education information/materials will improve Outcome: Progressing Goal: Emotional status will improve Outcome: Progressing Goal: Mental status will improve Outcome: Progressing Goal: Verbalization of understanding the information provided will improve Outcome: Progressing

## 2024-05-29 NOTE — BHH Counselor (Signed)
 Adult Comprehensive Assessment  Patient ID: [name], male   DOB: [birth year], 53 y.o.   MRN: [id number]  Information Source: Information source: Patient  Current Stressors:  Patient states their primary concerns and needs for treatment are:: alcohol  Patient states their goals for this hospitilization and ongoing recovery are:: I want to be detox and go home Educational / Learning stressors: none reported Employment / Job issues: none reported Family Relationships: none reported Surveyor, quantity / Lack of resources (include bankruptcy): no Housing / Lack of housing: none reported Physical health (include injuries & life threatening diseases): sometime Social relationships: none reported Substance abuse: yes' alcohol  Bereavement / Loss: none reported  Living/Environment/Situation:  Living Arrangements: Alone Living conditions (as described by patient or guardian): its good Who else lives in the home?: no one else How long has patient lived in current situation?: six years What is atmosphere in current home: Comfortable  Family History:  Marital status: Single Long term relationship, how long?: none reported What types of issues is patient dealing with in the relationship?: none reported Additional relationship information: UTA Are you sexually active?: Yes What is your sexual orientation?: Heterosexual Has your sexual activity been affected by drugs, alcohol , medication, or emotional stress?: No Does patient have children?: Yes How many children?: 2 How is patient's relationship with their children?: UTA  Childhood History:  By whom was/is the patient raised?: Mother Additional childhood history information: I mostly lived with my aunt and uncle Description of patient's relationship with caregiver when they were a child: She was my mother Patient's description of current relationship with people who raised him/her: its good How were you disciplined when  you got in trouble as a child/adolescent?: She was firm and direct Does patient have siblings?: Yes Number of Siblings: 1 Description of patient's current relationship with siblings: The best Did patient suffer any verbal/emotional/physical/sexual abuse as a child?: No Did patient suffer from severe childhood neglect?: No Has patient ever been sexually abused/assaulted/raped as an adolescent or adult?: No Was the patient ever a victim of a crime or a disaster?: No Witnessed domestic violence?: No Has patient been affected by domestic violence as an adult?: No  Education:  Highest grade of school patient has completed: college Currently a Consulting civil engineer?: No Learning disability?: No  Employment/Work Situation:   Employment Situation: Retired Passenger transport manager has Been Impacted by Current Illness: No What is the Longest Time Patient has Held a Job?: 30 years Where was the Patient Employed at that Time?: With the [employer] Has Patient ever Been in the [employer]?: Yes (Describe in comment) Did You Receive Any Psychiatric Treatment/Services While in the Military?: Yes Type of Psychiatric Treatment/Services in Military: Patient was diagnoised with PTSD while at [employer]:   Financial resources: Income from employment Does patient have a representative payee or guardian?: No  Alcohol /Substance Abuse:   What has been your use of drugs/alcohol  within the last 12 months?: alcohol  If attempted suicide, did drugs/alcohol  play a role in this?: No Alcohol /Substance Abuse Treatment Hx: Past Tx, Inpatient If yes, describe treatment: ive been to AA and other treatments Has alcohol /substance abuse ever caused legal problems?: Yes (I got a DUI)  Social Support System:   Forensic psychologist System: Poor Type of faith/religion: Catholic How does patient's faith help to cope with current illness?: it helps me tremedously  Leisure/Recreation:   Do You Have Hobbies?:  Yes Leisure and Hobbies: Yardwork  Strengths/Needs:   What is the patient's perception of their strengths?:  I'm pretty strong, cable of getting myself help Patient states they can use these personal strengths during their treatment to contribute to their recovery: It helps  Patient states these barriers may affect/interfere with their treatment: I dont have a weakeness Patient states these barriers may affect their return to the community: none reported Other important information patient would like considered in planning for their treatment: none reported  Discharge Plan:   Currently receiving community mental health services: Yes (From Whom) Patient states concerns and preferences for aftercare planning are: I don't know Patient states they will know when they are safe and ready for discharge when: I don't know Does patient have access to transportation?: No Does patient have financial barriers related to discharge medications?: No Patient description of barriers related to discharge medications: none reported Plan for no access to transportation at discharge: CSW will provide patient with a taxi voucher at discharge Will patient be returning to same living situation after discharge?: Yes  Summary/Recommendations:   Summary and Recommendations (to be completed by the evaluator): [name] "[name]" is a 53 year old Caucasian male. The patient appears to be very sleepy during assessment, closing his eyes and nodding of. The patient is a veteran and stated that he is 100 percent cover. Per chart the patient has multiple prior psychiatric hospitalization to include involuntary admissions for behaviors related to alcohol  use intoxication and was admitted to [facility] in June 2025 for SI while intoxication. The patient denies SI, HI and AVH at the current time.  The patient stated that he does not have support system but still communicate with his son. The  patient stated that he would like to learn on how to decrease the intake of alcohol  while he is here. Patient will benefit from crisis stabilization, medication evaluation, group therapy and psychoeducation, in addition to case management for discharge planning. At discharge it is recommended that Patient adhere to the established discharge plan and continue in treatment  Rheda Kassab O Astaria Nanez. 05/29/2024

## 2024-05-29 NOTE — BHH Suicide Risk Assessment (Signed)
 [facility] Admission Suicide Risk Assessment   Nursing information obtained from:    Demographic factors:    Current Mental Status:    Loss Factors:    Historical Factors:    Risk Reduction Factors:     Total Time spent with patient: 45 minutes Principal Problem: PTSD (post-traumatic stress disorder) Diagnosis:  Principal Problem:   PTSD (post-traumatic stress disorder) Active Problems:   Alcohol  use disorder, severe, dependence (HCC)   Alcohol -induced mood disorder (HCC)  Subjective Data: See H&P  Continued Clinical Symptoms:    The Alcohol  Use Disorders Identification Test, Guidelines for Use in Primary Care, Second Edition.  World Science writer [facility]). Score between 0-7:  no or low risk or alcohol  related problems. Score between 8-15:  moderate risk of alcohol  related problems. Score between 16-19:  high risk of alcohol  related problems. Score 20 or above:  warrants further diagnostic evaluation for alcohol  dependence and treatment.   CLINICAL FACTORS:   PTSD, alcohol  induced mood disorder    COGNITIVE FEATURES THAT CONTRIBUTE TO RISK:  None    SUICIDE RISK:   Severe:  Frequent, intense, and enduring suicidal ideation, specific plan, no subjective intent, but some objective markers of intent (i.e., choice of lethal method), the method is accessible, some limited preparatory behavior, evidence of impaired self-control, severe dysphoria/symptomatology, multiple risk factors present, and few if any protective factors, particularly a lack of social support.  PLAN OF CARE: See H&P  I certify that inpatient services furnished can reasonably be expected to improve the patient's condition.   Dennis DELENA Salmon, DO 05/29/2024, 4:53 PM

## 2024-05-30 ENCOUNTER — Encounter (HOSPITAL_COMMUNITY): Payer: Self-pay

## 2024-05-30 LAB — VITAMIN D 25 HYDROXY (VIT D DEFICIENCY, FRACTURES): Vit D, 25-Hydroxy: 30.27 ng/mL (ref 30–100)

## 2024-05-30 LAB — VITAMIN B12: Vitamin B-12: 264 pg/mL (ref 180–914)

## 2024-05-30 LAB — FOLATE: Folate: 12.6 ng/mL (ref >5.9)

## 2024-05-30 MED ORDER — NICOTINE POLACRILEX 2 MG MT GUM
2.0000 mg | CHEWING_GUM | OROMUCOSAL | Status: DC | PRN
  Administered 2024-05-30 – 2024-06-02 (×20): 2 mg via ORAL
  Filled 2024-05-30 (×11): qty 1

## 2024-05-30 NOTE — Group Note (Signed)
 Date:  05/30/2024 Time:  4:02 PM  Group Topic/Focus:  Goals Group:   The focus of this group is to help patients establish daily goals to achieve during treatment and discuss how the patient can incorporate goal setting into their daily lives to aide in recovery. Orientation:   The focus of this group is to educate the patient on the purpose and policies of crisis stabilization and provide a format to answer questions about their admission.  The group details unit policies and expectations of patients while admitted.    Participation Level:  Did Not Attend   [name] 05/30/2024, 4:02 PM

## 2024-05-30 NOTE — Group Note (Signed)
 Recreation Therapy Group Note   Group Topic:Team Building  Group Date: 05/30/2024 Start Time: 0930 End Time: 0958 Facilitators: Aarica Wax-McCall, LRT,CTRS Location: [address] Dayroom   Group Topic: Communication, Team Building, Problem Solving  Goal Area(s) Addresses:  Patient will effectively work with peer towards shared goal.  Patient will identify skills used to make activity successful.  Patient will identify how skills used during activity can be applied to reach post d/c goals.   Behavioral Response:   Intervention: STEM Activity- Glass blower/designer  Activity: Tallest [employer]. In teams of 5-6, patients were given 11 craft pipe cleaners. Using the materials provided, patients were instructed to compete again the opposing team(s) to build the tallest free-standing structure from floor level. The activity was timed; difficulty increased by Clinical research associate as Production designer, theatre/television/film continued.  Systematically resources were removed with additional directions for example, placing one arm behind their back, working in silence, and shape stipulations. LRT facilitated post-activity discussion reviewing team processes and necessary communication skills involved in completion. Patients were encouraged to reflect how the skills utilized, or not utilized, in this activity can be incorporated to positively impact support systems post discharge.  Education: Pharmacist, community, Scientist, physiological, Discharge Planning   Education Outcome: Acknowledges education/In group clarification offered/Needs additional education.    Affect/Mood: N/A   Participation Level: Did not attend    Clinical Observations/Individualized Feedback:      Plan: Continue to engage patient in RT group sessions 2-3x/week.   Iram Astorino-McCall, LRT,CTRS 05/30/2024 12:57 PM

## 2024-05-30 NOTE — Group Note (Deleted)
 Date:  05/30/2024 Time:  8:07 PM  Group Topic/Focus:       Participation Level:  {BHH PARTICIPATION OZCZO:77735}  Participation Quality:  {BHH PARTICIPATION QUALITY:22265}  Affect:  {BHH AFFECT:22266}  Cognitive:  {BHH COGNITIVE:22267}  Insight: {BHH Insight2:20797}  Engagement in Group:  {BHH ENGAGEMENT IN HMNLE:77731}  Modes of Intervention:  {BHH MODES OF INTERVENTION:22269}  Additional Comments:  ***  Phoenyx Paulsen A Dontavious Emily 05/30/2024, 8:07 PM

## 2024-05-30 NOTE — Progress Notes (Signed)
   05/30/24 0823  Psych Admission Type (Psych Patients Only)  Admission Status Involuntary  Psychosocial Assessment  Patient Complaints Anergic;Anxiety  Eye Contact Fair  Facial Expression Pained  Affect Depressed  Speech Soft  Interaction Assertive  Motor Activity Other (Comment) (WDL)  Appearance/Hygiene Disheveled  Behavior Characteristics Appropriate to situation;Cooperative  Mood Anxious  Aggressive Behavior  Targets Self  Type of Behavior Verbal  Effect No apparent injury  Thought Process  Coherency WDL  Content WDL  Delusions WDL  Perception WDL  Hallucination None reported or observed  Judgment WDL  Confusion WDL  Danger to Self  Current suicidal ideation? Active  Agreement Not to Harm Self Yes  Description of Agreement Contract for safety  Danger to Others  Danger to Others None reported or observed

## 2024-05-30 NOTE — Group Note (Signed)
 Therapy Group Note  Group Topic:Other  Group Date: 05/30/2024 Start Time: 1500 End Time: 1534 Facilitators: Dot [place] MATSU, OT    The objective of today's group is to provide a comprehensive understanding of the concept of motivation and its role in human behavior and well-being. The content covers various theories of motivation, including intrinsic and extrinsic motivators, and explores the psychological mechanisms that drive individuals to achieve goals, overcome obstacles, and make decisions. By diving into real-world applications, the group aims to offer actionable strategies for enhancing motivation in different life domains, such as work, relationships, and personal growth.  Utilizing a multi-disciplinary approach, this group integrates insights from psychology, neuroscience, and behavioral economics to present a holistic view of motivation. The objective is not only to educate the audience about the complexities and driving forces behind motivation but also to equip them with practical tools and techniques to improve their own motivation levels. By the end of this multi-day group, patient's should have a well-rounded understanding of what motivates human actions and how to harness this knowledge for personal and professional betterment.     Participation Level: Engaged   Participation Quality: Independent   Behavior: Appropriate   Speech/Thought Process: Relevant   Affect/Mood: Appropriate   Insight: Fair   Judgement: Fair      Modes of Intervention: Education  Patient Response to Interventions:  Attentive   Plan: Continue to engage patient in OT groups 2 - 3x/week.  05/30/2024  [place] MATSU Dot, OT  Carleah Yablonski, OT

## 2024-05-30 NOTE — Progress Notes (Signed)
(  Sleep Hours) -12.75 (Any PRNs that were needed, meds refused, or side effects to meds)- prn trazodone , and hydroxyzine  @ 2059 (Any disturbances and when (visitation, over night)-none (Concerns raised by the patient)-none  (SI/HI/AVH)- Denies all

## 2024-05-30 NOTE — Group Note (Signed)
 Date:  05/30/2024 Time:  4:57 PM  Group Topic/Focus:  Emotional and Physical Wellness Education:   The purpose of this group is to identify and challenge unhealthy thought patterns key to emotional wellness. The connection between emotional and physical wellness was also discussed.     Participation Level:  Did Not Attend    [name] 05/30/2024, 4:57 PM

## 2024-05-30 NOTE — Plan of Care (Signed)
   Problem: Education: Goal: Knowledge of [facility] Education information/materials will improve Outcome: Progressing   Problem: Activity: Goal: Interest or engagement in activities will improve Outcome: Progressing   Problem: Coping: Goal: Ability to verbalize frustrations and anger appropriately will improve Outcome: Progressing
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
## 2024-05-30 NOTE — BH IP Treatment Plan (Signed)
 Interdisciplinary Treatment and Diagnostic Plan Update  05/30/2024 Time of Session: 10:05AM [name] MRN: [id number]  Principal Diagnosis: PTSD (post-traumatic stress disorder)  Secondary Diagnoses: Principal Problem:   PTSD (post-traumatic stress disorder) Active Problems:   Alcohol  use disorder, severe, dependence (HCC)   Alcohol -induced mood disorder (HCC)   Current Medications:  Current Facility-Administered Medications  Medication Dose Route Frequency Provider Last Rate Last Admin   acetaminophen  (TYLENOL ) tablet 650 mg  650 mg Oral Q6H PRN Tex Drilling, NP       acetaminophen  (TYLENOL ) tablet 650 mg  650 mg Oral Q6H PRN Tex Drilling, NP   650 mg at 05/29/24 0912   alum & mag hydroxide-simeth (MAALOX/MYLANTA) 200-200-20 MG/5ML suspension 30 mL  30 mL Oral Q4H PRN Tex Drilling, NP       alum & mag hydroxide-simeth (MAALOX/MYLANTA) 200-200-20 MG/5ML suspension 30 mL  30 mL Oral Q4H PRN Tex Drilling, NP       chlordiazePOXIDE  (LIBRIUM ) capsule 25 mg  25 mg Oral Q6H PRN Tex Drilling, NP   25 mg at 05/29/24 1556   chlordiazePOXIDE  (LIBRIUM ) capsule 25 mg  25 mg Oral TID Tex Drilling, NP   25 mg at 05/30/24 1242   Followed by   NOREEN ON 05/31/2024] chlordiazePOXIDE  (LIBRIUM ) capsule 25 mg  25 mg Oral BH-qamhs Nkwenti, Doris, NP       Followed by   NOREEN ON 06/02/2024] chlordiazePOXIDE  (LIBRIUM ) capsule 25 mg  25 mg Oral Daily Nkwenti, Doris, NP       haloperidol  (HALDOL ) tablet 5 mg  5 mg Oral TID PRN Tex Drilling, NP       And   diphenhydrAMINE  (BENADRYL ) capsule 50 mg  50 mg Oral TID PRN Tex Drilling, NP       haloperidol  lactate (HALDOL ) injection 10 mg  10 mg Intramuscular TID PRN Tex Drilling, NP       And   diphenhydrAMINE  (BENADRYL ) injection 50 mg  50 mg Intramuscular TID PRN Tex Drilling, NP       And   LORazepam  (ATIVAN ) injection 2 mg  2 mg Intramuscular TID PRN Tex Drilling, NP       haloperidol  lactate (HALDOL ) injection 5 mg  5 mg  Intramuscular TID PRN Tex Drilling, NP       And   diphenhydrAMINE  (BENADRYL ) injection 50 mg  50 mg Intramuscular TID PRN Tex Drilling, NP       And   LORazepam  (ATIVAN ) injection 2 mg  2 mg Intramuscular TID PRN Tex Drilling, NP       gabapentin  (NEURONTIN ) capsule 300 mg  300 mg Oral TID Tex Drilling, NP   300 mg at 05/30/24 1242   hydrOXYzine  (ATARAX ) tablet 25 mg  25 mg Oral TID PRN Tex Drilling, NP       hydrOXYzine  (ATARAX ) tablet 25 mg  25 mg Oral TID PRN Tex Drilling, NP   25 mg at 05/29/24 2059   hydrOXYzine  (ATARAX ) tablet 25 mg  25 mg Oral Q6H PRN Tex Drilling, NP       loperamide  (IMODIUM ) capsule 2-4 mg  2-4 mg Oral PRN Nkwenti, Doris, NP       magnesium  hydroxide (MILK OF MAGNESIA) suspension 30 mL  30 mL Oral Daily PRN Nkwenti, Doris, NP       magnesium  hydroxide (MILK OF MAGNESIA) suspension 30 mL  30 mL Oral Daily PRN Nkwenti, Doris, NP       multivitamin with minerals tablet 1 tablet  1 tablet Oral Daily  Tex Drilling, NP   1 tablet at 05/30/24 0839   nicotine  polacrilex (NICORETTE ) gum 2 mg  2 mg Oral PRN Parker, Alvin S, MD   2 mg at 05/30/24 1242   ondansetron  (ZOFRAN -ODT) disintegrating tablet 4 mg  4 mg Oral Q6H PRN Tex Drilling, NP   4 mg at 05/29/24 1556   traZODone  (DESYREL ) tablet 50 mg  50 mg Oral QHS PRN Tex Drilling, NP       traZODone  (DESYREL ) tablet 50 mg  50 mg Oral QHS PRN Tex Drilling, NP   50 mg at 05/29/24 2058   PTA Medications: Medications Prior to Admission  Medication Sig Dispense Refill Last Dose/Taking   ezetimibe (ZETIA) 10 MG tablet Take 10 mg by mouth daily.   Past Week   folic acid  (FOLVITE ) 1 MG tablet Take 1 mg by mouth daily.   Past Week   thiamine  (VITAMIN B1) 100 MG tablet Take 100 mg by mouth daily.   Past Week    Patient Stressors: Substance abuse   Traumatic event    Patient Strengths: Capable of independent living  Supportive family/friends  Work skills   Treatment Modalities: Medication Management, Group  therapy, Case management,  1 to 1 session with clinician, Psychoeducation, Recreational therapy.   Physician Treatment Plan for Primary Diagnosis: PTSD (post-traumatic stress disorder) Long Term Goal(s): Improvement in symptoms so as ready for discharge   Short Term Goals: Ability to identify changes in lifestyle to reduce recurrence of condition will improve Ability to verbalize feelings will improve Ability to demonstrate self-control will improve Ability to identify and develop effective coping behaviors will improve Ability to identify triggers associated with substance abuse/mental health issues will improve  Medication Management: Evaluate patient's response, side effects, and tolerance of medication regimen.  Therapeutic Interventions: 1 to 1 sessions, Unit Group sessions and Medication administration.  Evaluation of Outcomes: Not Progressing  Physician Treatment Plan for Secondary Diagnosis: Principal Problem:   PTSD (post-traumatic stress disorder) Active Problems:   Alcohol  use disorder, severe, dependence (HCC)   Alcohol -induced mood disorder (HCC)  Long Term Goal(s): Improvement in symptoms so as ready for discharge   Short Term Goals: Ability to identify changes in lifestyle to reduce recurrence of condition will improve Ability to verbalize feelings will improve Ability to demonstrate self-control will improve Ability to identify and develop effective coping behaviors will improve Ability to identify triggers associated with substance abuse/mental health issues will improve     Medication Management: Evaluate patient's response, side effects, and tolerance of medication regimen.  Therapeutic Interventions: 1 to 1 sessions, Unit Group sessions and Medication administration.  Evaluation of Outcomes: Not Progressing   RN Treatment Plan for Primary Diagnosis: PTSD (post-traumatic stress disorder) Long Term Goal(s): Knowledge of disease and therapeutic regimen to  maintain health will improve  Short Term Goals: Ability to remain free from injury will improve, Ability to verbalize frustration and anger appropriately will improve, Ability to demonstrate self-control, Ability to participate in decision making will improve, Ability to verbalize feelings will improve, Ability to disclose and discuss suicidal ideas, and Ability to identify and develop effective coping behaviors will improve  Medication Management: RN will administer medications as ordered by provider, will assess and evaluate patient's response and provide education to patient for prescribed medication. RN will report any adverse and/or side effects to prescribing provider.  Therapeutic Interventions: 1 on 1 counseling sessions, Psychoeducation, Medication administration, Evaluate responses to treatment, Monitor vital signs and CBGs as ordered, Perform/monitor CIWA, COWS, AIMS and  Fall Risk screenings as ordered, Perform wound care treatments as ordered.  Evaluation of Outcomes: Not Progressing   LCSW Treatment Plan for Primary Diagnosis: PTSD (post-traumatic stress disorder) Long Term Goal(s): Safe transition to appropriate next level of care at discharge, Engage patient in therapeutic group addressing interpersonal concerns.  Short Term Goals: Engage patient in aftercare planning with referrals and resources, Increase social support, Increase ability to appropriately verbalize feelings, Facilitate acceptance of mental health diagnosis and concerns, Facilitate patient progression through stages of change regarding substance use diagnoses and concerns, Identify triggers associated with mental health/substance abuse issues, and Increase skills for wellness and recovery  Therapeutic Interventions: Assess for all discharge needs, 1 to 1 time with Social worker, Explore available resources and support systems, Assess for adequacy in community support network, Educate family and significant other(s) on  suicide prevention, Complete Psychosocial Assessment, Interpersonal group therapy.  Evaluation of Outcomes: Not Progressing   Progress in Treatment: Attending groups: No. Participating in groups: No. Taking medication as prescribed: No. Toleration medication: No. Family/Significant other contact made: No, will contact:  family if given consent. Patient understands diagnosis: Yes. Discussing patient identified problems/goals with staff: Yes. Medical problems stabilized or resolved: Yes. Denies suicidal/homicidal ideation: Yes. Issues/concerns per patient self-inventory: No.  Patient Goals:   I don't really know, I guess I was IVC'd. I know I don't want any medications. I want to be cognitive on my own.   Discharge Plan or Barriers: Pt likely to discharge home once stable.   Reason for Continuation of Hospitalization: Depression  Estimated Length of Stay: 5-7 days  Last 3 [place] Suicide Severity Risk Score: Flowsheet Row ED from 05/28/2024 in [facility] ED from 04/13/2024 in [facility] Emergency Department [facility] Admission [facility]Discharged) [facility]  C-SSRS RISK CATEGORY High Risk High Risk No Risk    Last PHQ 2/9 Scores:    12/25/2023    8:37 AM 12/23/2023    1:14 PM  Depression screen PHQ 2/9  Decreased Interest 0 0  Down, Depressed, Hopeless 0 0  PHQ - 2 Score 0 0    Scribe for Treatment Team: Tamelia Michalowski M Seri Kimmer, [place]RA[place] 05/30/2024 1:28 PM

## 2024-05-30 NOTE — [facility] Group Notes (Signed)
 Spiritual care group on grief and loss facilitated by Chaplain Dyanne Carrel, [facility]  Group Goal: Support / Education around grief and loss  Members engage in facilitated group support and psycho-social education.  Group Description:  Following introductions and group rules, group members engaged in facilitated group dialogue and support around topic of loss, with particular support around experiences of loss in their lives. Group Identified types of loss (relationships / self / things) and identified patterns, circumstances, and changes that precipitate losses. Reflected on thoughts / feelings around loss, normalized grief responses, and recognized variety in grief experience. Group encouraged individual reflection on safe space and on the coping skills that they are already utilizing.  Group drew on Adlerian / Rogerian and narrative framework  Patient Progress: [name] attended group and actively engaged and participated in group conversation and activities.

## 2024-05-30 NOTE — Plan of Care (Signed)
   Problem: Education: Goal: Knowledge of [facility] Education information/materials will improve Outcome: Progressing Goal: Emotional status will improve Outcome: Progressing Goal: Mental status will improve Outcome: Progressing Goal: Verbalization of understanding the information provided will improve Outcome: Progressing

## 2024-05-31 LAB — RPR: RPR Ser Ql: NONREACTIVE

## 2024-05-31 LAB — HIV ANTIBODY (ROUTINE TESTING W REFLEX): HIV Screen 4th Generation wRfx: NONREACTIVE

## 2024-05-31 NOTE — Group Note (Signed)
 Date:  05/31/2024 Time:  9:58 AM  Group Topic/Focus:  Goals Group:   The focus of this group is to help patients establish daily goals to achieve during treatment and discuss how the patient can incorporate goal setting into their daily lives to aide in recovery.    Participation Level:  Active  Participation Quality:  Appropriate  Affect:  Appropriate  Cognitive:  Appropriate  Insight: Appropriate  Engagement in Group:  Engaged  Modes of Intervention:  Discussion  Additional Comments:  pt wants to speak to doctors about why he is here and have a plan to leave  Nat Rummer 05/31/2024, 9:58 AM

## 2024-05-31 NOTE — Progress Notes (Signed)
(  Sleep Hours) -6.5 (Any PRNs that were needed, meds refused, or side effects to meds)- none (Any disturbances and when (visitation, over night)-none (Concerns raised by the patient)- none (SI/HI/AVH)- Denies all

## 2024-05-31 NOTE — Group Note (Signed)
 LCSW Group Therapy Note   Group Date: 05/31/2024 Start Time: 1100 End Time: 1200   Participation:  patient was present.  He listened and was respectful, and minimally participated in the discussion.  Type of Therapy:  Group Therapy  Topic:  Understanding Your Path to Change  Objective:  The goal is to help individuals understand the stages of change, identify where they currently are in the process, and provide actionable next steps to continue moving forward in their journey of change.  Goals: Learn about the six stages of change:  Precontemplation, Contemplation, Preparation, Action, Maintenance, and Relapse Reflect on Current Change Efforts:  Recognize which stage participants are in regarding a personal change. Plan Next Steps for Moving Forward:  Create an action plan based on their current stage of change.  Group Summary:  In this session, we explored the Stages of Change as a framework to understand the process of change.  We discussed how each stage helps individuals recognize where they are in their personal journey and used the Stages of Change Worksheet for self-reflection. Participants answered questions to better understand their current stage, challenges, and progress. We also emphasized the importance of moving forward, even if setbacks (Relapse) occur, and created actionable steps to help participants continue progressing. By the end of the session, participants gained a clearer understanding of their path to change and left with a clear plan for next steps.  Therapeutic Modalities:  Elements of CBT (cognitive restructuring, problem solving)  Element of DBT (mindfulness, distress tolerance)   [name] O Jamey Demchak, LCSWA 05/31/2024  5:13 PM

## 2024-05-31 NOTE — [facility] Group Notes (Signed)
 [facility] Group Notes:  (Nursing/MHT/Case Management/Adjunct)  Date:  05/31/2024  Time:  9:04 PM  Type of Therapy:  Wrap-up group  Participation Level:  Active  Participation Quality:  Appropriate  Affect:  Appropriate  Cognitive:  Appropriate  Insight:  Appropriate  Engagement in Group:  Engaged  Modes of Intervention:  Education  Summary of Progress/Problems: PT goal to stay positive. Rated his day 10/10.  [name] [place] 05/31/2024, 9:04 PM

## 2024-05-31 NOTE — Progress Notes (Signed)
 Tour of Duty:  Prentice JINNY Angle, RN, 05/31/24, Tour of Duty: 0700-1900  SI/HI/AVH: Denies  Self-Reported   Mood: Negative  Anxiety: Denies, but observable Depression: Denies Irritability: Denies  Broset  Violence Prevention Guidelines *See Row Information*: (not recorded)   LBM  Last BM Date : 05/31/24   Pain: not present  Patient Refusals (including Rx): No  Shift Summary: Patient observed to be anxious on unit. Patient is guarded and evasive and preoccupied with discharge. Patient able to make needs known. Patient observed to engage appropriately with staff and peers. Patient taking medications as prescribed. This shift, PRN medication requested and administered. No observed or reported side effects to medication. No observed or reported agitation, aggression, or other acute emotional distress. No observed or reported physical abnormalities or concerns.    Last Vitals  Vitals Weight: 69.1 kg Temp: 97.6 F (36.4 C) Temp Source: Oral Pulse Rate: 82 Resp: 14 BP: 115/80 Patient Position: (not recorded)  Admission Type  Psych Admission Type (Psych Patients Only) Admission Status: Involuntary Date 72 hour document signed : (not recorded) Time 72 hour document signed : (not recorded) Provider Notified (First and Last Name) (see details for LINK to note): (not recorded)   Psychosocial Assessment  Psychosocial Assessment Patient Complaints: None Eye Contact: Avertive Facial Expression: Anxious Affect: Anxious, Preoccupied Speech: Logical/coherent Interaction: Guarded Motor Activity: Other (Comment) (WDL) Appearance/Hygiene: Unremarkable Behavior Characteristics: Resistant to care Mood: Anxious, Suspicious   Aggressive Behavior  Targets: (not recorded)   Thought Process  Thought Process Coherency: Within Defined Limits Content: Preoccupation Delusions: None reported or observed Perception: Within Defined Limits Hallucination: None reported or  observed Judgment: Impaired Confusion: None  Danger to Self/Others  Danger to Self Current suicidal ideation?: Denies Description of Suicide Plan: (not recorded) Self-Injurious Behavior: (not recorded) Agreement Not to Harm Self: (not recorded) Description of Agreement: (not recorded) Danger to Others: None reported or observed

## 2024-05-31 NOTE — [facility] Group Notes (Signed)
 [facility] Group Notes:  (Nursing/MHT/Case Management/Adjunct)  Date:  05/31/2024  Time:  3:08 AM  Type of Therapy:  Wrap- up group  Participation Level:  Active  Participation Quality:  Appropriate  Affect:  Appropriate  Cognitive:  Appropriate  Insight:  Appropriate  Engagement in Group:  Engaged  Modes of Intervention:  Education  Summary of Progress/Problems: Goal Stay calm. Rated day 9/10.  [name] 05/31/2024, 3:08 AM

## 2024-05-31 NOTE — Group Note (Signed)
 Date:  05/31/2024 Time:  3:51 PM  Group Topic/Focus:  Overcoming Stress:   The focus of this group is to define stress and help patients assess their triggers. Self Care:   The focus of this group is to help patients understand the importance of self-care in order to improve or restore emotional, physical, spiritual, interpersonal, and financial health.    Participation Level:  Active  Participation Quality:  Appropriate and Attentive  Affect:  Anxious  Cognitive:  Alert and Appropriate  Insight: Good  Engagement in Group:  Engaged  Modes of Intervention:  Discussion   Annalee  Elgie Landino 05/31/2024, 3:51 PM

## 2024-05-31 NOTE — Plan of Care (Signed)
   Problem: Education: Goal: Emotional status will improve Outcome: Progressing Goal: Mental status will improve Outcome: Progressing
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
## 2024-05-31 NOTE — Group Note (Signed)
 Recreation Therapy Group Note   Group Topic:Animal Assisted Therapy   Group Date: 05/31/2024 Start Time: 0945 End Time: 1030 Facilitators: Kervin Bones-McCall, LRT,CTRS Location: [address] Day[address]   Animal-Assisted Activity (AAA) Program Checklist/Progress Notes Patient Eligibility Criteria Checklist & Daily Group note for Rec Tx Intervention  AAA/T Program Assumption of Risk Form signed by Patient/ or Parent Legal Guardian Yes  Patient is free of allergies or severe asthma Yes  Patient reports no fear of animals Yes  Patient reports no history of cruelty to animals Yes  Patient understands his/her participation is voluntary Yes  Patient washes hands before animal contact Yes  Patient washes hands after animal contact Yes  Behavioral Response: Active   Education: Charity fundraiser, Appropriate Animal Interaction   Education Outcome: Acknowledges education.    Affect/Mood: Appropriate   Participation Level: Active   Participation Quality: Independent   Behavior: Appropriate   Speech/Thought Process: Focused   Insight: Good   Judgement: Good   Modes of Intervention: Teaching laboratory technician   Patient Response to Interventions:  Engaged   Education Outcome:  In group clarification offered    Clinical Observations/Individualized Feedback: Pt was quiet but bright. Pt had some engagement with therapy dog team. Pt also shared about his pets at home.     Plan: Continue to engage patient in RT group sessions 2-3x/week.   Adin Lariccia-McCall, LRT,CTRS 05/31/2024 12:43 PM

## 2024-06-01 NOTE — Group Note (Signed)
 Recreation Therapy Group Note   Group Topic:Communication  Group Date: 06/01/2024 Start Time: 0935 End Time: 1000 Facilitators: Aser Nylund-McCall, LRT,CTRS Location: [address] Day[address]   Group Topic: Communication, Problem Solving   Goal Area(s) Addresses:  Patient will effectively listen to complete activity.  Patient will identify communication skills used to make activity successful.  Patient will identify how skills used during activity can be used to reach post d/c goals.    Behavioral Response: Engaged   Intervention: Building surveyor Activity - Geometric pattern cards, pencils, blank paper    Activity: Geometric Drawings.  Three volunteers from the peer group will be shown an abstract picture with a particular arrangement of geometrical shapes.  Each round, one 'speaker' will describe the pattern, as accurately as possible without revealing the image to the group.  The remaining group members will listen and draw the picture to reflect how it is described to them. Patients with the role of 'listener' cannot ask clarifying questions but, may request that the speaker repeat a direction. Once the drawings are complete, the presenter will show the rest of the group the picture and compare how close each person came to drawing the picture. LRT will facilitate a post-activity discussion regarding effective communication and the importance of planning, listening, and asking for clarification in daily interactions with others.  Education: Environmental consultant, Active listening, Support systems, Discharge planning  Education Outcome: Acknowledges understanding/In group clarification offered/Needs additional education.    Affect/Mood: Appropriate   Participation Level: Engaged   Participation Quality: Independent   Behavior: Appropriate   Speech/Thought Process: Focused   Insight: Good   Judgement: Good   Modes of Intervention: Activity   Patient Response to  Interventions:  Engaged   Education Outcome:  In group clarification offered    Clinical Observations/Individualized Feedback: Pt came in late to group. Pt was the last presenter. Pt appeared at times to get a frustrated when peer kept bum barding him with questions but he managed to work through it. Pt was bright and expressed during processing he learned from the activity he sucks at listening.    Plan: Continue to engage patient in RT group sessions 2-3x/week.   Lindzee Gouge-McCall, LRT,CTRS  06/01/2024 11:46 AM

## 2024-06-01 NOTE — Group Note (Signed)
 Date:  06/01/2024 Time:  6:02 PM  Group Topic/Focus:  Coping With Mental Health Crisis:   The purpose of this group is to help patients identify strategies for coping with mental health crisis.  Group discusses possible causes of crisis and ways to manage them effectively. Developing a Wellness Toolbox:   The focus of this group is to help patients develop a wellness toolbox with skills and strategies to promote recovery upon discharge. Identifying Needs:   The focus of this group is to help patients identify their personal needs that have been historically problematic and identify healthy behaviors to address their needs.    Participation Level:  Active  Participation Quality:  Attentive  Affect:  Appropriate  Cognitive:  Appropriate  Insight: Appropriate  Engagement in Group:  Engaged  Modes of Intervention:  Discussion  Additional Comments:    Eleanor JAYSON Metro 06/01/2024, 6:02 PM

## 2024-06-01 NOTE — Progress Notes (Signed)
 [facility] MD Progress Note  06/01/2024 9:57 AM [name]  MRN:  [id number] Subjective:  [name] states, I am not suicidal.  I love myself, and have no intention of killing myself.  Principal Problem: PTSD (post-traumatic stress disorder) Diagnosis: Principal Problem:   PTSD (post-traumatic stress disorder) Active Problems:   Alcohol  use disorder, severe, dependence (HCC)   Alcohol -induced mood disorder (HCC)  Reason for admission:  The patient is a 53 y/o male with a AUD and PTSD and multiple prior ED and hospital visits for alcohol  use who presented to [facility] on 8/23 under police escort after his GF petitioned for IVC for danger to self. At [facility] his BAL was 292. During assessment he is unable to provide any recollection of his behavior while at [facility] [place]. Per NP Nkwenti:  During encounter with patient, he fluctuates between irritability, and tearfulness, and presents that way through entire encounter, he endorses suicidal ideations, reports having a plan, which he refuses to share with Clinical research associate, states that he does not own a gun, but repeatedly motions shooting a gun with his hand as if he is clicking on the barrel of a gun. He repeatedly states to writer you want me to be honest with you?  I should not be alive.  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  Patient denies homicidal ideations, but seems to be reliving the events of his combat days, cries hysterically when question regarding HI is asked, but then states he does not want to hurt anybody else.  He is asked if he is having auditory hallucinations, he denies it, but states that he is having visual hallucinations of death. He is asked what death looks like and cries even more stating what type of question is that?  Daily notes:  Garrel is seen this morning. Chart reviewed. The chart findings discussed with the treatment team. He presents alert, oriented & aware of situation. He is visible on the unit, attending group sessions. He  presents with a good affect, good eye contact & verbally responsive. He reports, I'm doing well. I have been in this hospital since last Saturday because I drank a lot of alcohol . I'm now done with the withdrawal symptoms. I made it clear that I cannot take any medicines for mental health while I'm here. I do go to the [place] for all my medical & mental health care. I would like to start any new medicines with the [place]. I'm ready now to be discharged. I'm feeling like myself again. I'm sleeping well. I have no symptoms of depression or anxiety. I'm not feeling like hurting myself or any one else. I'm not hearing any voices or seeing things. During the treatment team meeting this afternoon, it was decided that patient has met the maximum benefit of this hospitalization. He is to be discharged tomorrow. His vital signs remain stable.  Total Time spent with patient: 35 minutes  Past Psychiatric History: Multiple prior psychiatric hospitalizations, to include involuntary admissions for behaviors related to alcohol  use and intoxication. His most recent admission to [facility] was in June 2025 for SI while intoxicated. He has documented allergies to venlafaxine and paroxetine but reports that he has not regularly taken psychotropic medications for PTSD and does not want to. He denies any history of suicidal behaviors. He has engaged in therapy through the [place] in the past but is not currently in therapy. He reports 4-5 lifetime stays at an inpatient SUD program over the years. He reports previously  being engaged with AA for about 20 years but stopped due to lack of perceived benefit.   Past Medical History:  Past Medical History:  Diagnosis Date   Active smoker    Alcohol  abuse    Anxiety    Bipolar 2 disorder (HCC)    Depression    PTSD (post-traumatic stress disorder)     Past Surgical History:  Procedure Laterality Date   HERNIA REPAIR     LYMPH NODE DISSECTION     Family History:  Family History   Problem Relation Age of Onset   Alcohol  abuse Father    Family Psychiatric  History: See H&P reports Social History:  Social History   Substance and Sexual Activity  Alcohol  Use Not Currently   Comment: Patient states he is a binge drinker. Last drink 06/09/23     Social History   Substance and Sexual Activity  Drug Use Yes   Types: Marijuana    Social History   Socioeconomic History   Marital status: Legally Separated    Spouse name: Not on file   Number of children: Not on file   Years of education: Not on file   Highest education level: Not on file  Occupational History   Not on file  Tobacco Use   Smoking status: Every Day    Current packs/day: 2.00    Average packs/day: 2.0 packs/day for 20.0 years (40.0 ttl pk-yrs)    Types: Cigarettes   Smokeless tobacco: Never  Vaping Use   Vaping status: Never Used  Substance and Sexual Activity   Alcohol  use: Not Currently    Comment: Patient states he is a binge drinker. Last drink 06/09/23   Drug use: Yes    Types: Marijuana   Sexual activity: Yes  Other Topics Concern   Not on file  Social History Narrative   Has an ex-wife and 78 yo son whom he cannot see now, as his ex-wife divorced him. Was in the [employer] and went to [place], [place], and may have some PTSD symptoms from this. Was also a Management consultant. Heavy alcohol  abuse during binges, but has had periods of sobriety for months on end. Has been drinking up to two cases of beer a day   Social Drivers of Health   Financial Resource Strain: Not on file  Food Insecurity: Patient Unable To Answer (05/28/2024)   Hunger Vital Sign    Worried About Running Out of Food in the Last Year: Patient unable to answer    Ran Out of Food in the Last Year: Patient unable to answer  Transportation Needs: Unmet Transportation Needs (05/28/2024)   PRAPARE - Administrator, Civil Service (Medical): Yes    Lack of Transportation (Non-Medical): Patient unable to answer   Physical Activity: Not on file  Stress: Not on file  Social Connections: Unknown (10/26/2023)   Social Connection and Isolation Panel    Frequency of Communication with Friends and Family: Not on file    Frequency of Social Gatherings with Friends and Family: Not on file    Attends Religious Services: Not on file    Active Member of Clubs or Organizations: Not on file    Attends Banker Meetings: Not on file    Marital Status: Divorced   Additional Social History:    Sleep: Good Estimated Sleeping Duration (Last 24 Hours): 4.75-6.25 hours  Appetite:  Good  Current Medications: Current Facility-Administered Medications  Medication Dose Route Frequency Provider Last Rate Last Admin  acetaminophen  (TYLENOL ) tablet 650 mg  650 mg Oral Q6H PRN [place] Drilling, NP       acetaminophen  (TYLENOL ) tablet 650 mg  650 mg Oral Q6H PRN [place] Drilling, NP   650 mg at 05/31/24 9391   alum & mag hydroxide-simeth (MAALOX/MYLANTA) 200-200-20 MG/5ML suspension 30 mL  30 mL Oral Q4H PRN [place] Drilling, NP       alum & mag hydroxide-simeth (MAALOX/MYLANTA) 200-200-20 MG/5ML suspension 30 mL  30 mL Oral Q4H PRN [place] Drilling, NP       [START ON 06/02/2024] chlordiazePOXIDE  (LIBRIUM ) capsule 25 mg  25 mg Oral Daily Nkwenti, Doris, NP       haloperidol  (HALDOL ) tablet 5 mg  5 mg Oral TID PRN [place] Drilling, NP       And   diphenhydrAMINE  (BENADRYL ) capsule 50 mg  50 mg Oral TID PRN [place] Drilling, NP       haloperidol  lactate (HALDOL ) injection 10 mg  10 mg Intramuscular TID PRN [place] Drilling, NP       And   diphenhydrAMINE  (BENADRYL ) injection 50 mg  50 mg Intramuscular TID PRN [place] Drilling, NP       And   LORazepam  (ATIVAN ) injection 2 mg  2 mg Intramuscular TID PRN [place] Drilling, NP       haloperidol  lactate (HALDOL ) injection 5 mg  5 mg Intramuscular TID PRN [place] Drilling, NP       And   diphenhydrAMINE  (BENADRYL ) injection 50 mg  50 mg Intramuscular TID PRN [place] Drilling, NP        And   LORazepam  (ATIVAN ) injection 2 mg  2 mg Intramuscular TID PRN [place] Drilling, NP       gabapentin  (NEURONTIN ) capsule 300 mg  300 mg Oral TID [place] Drilling, NP   300 mg at 06/01/24 0802   hydrOXYzine  (ATARAX ) tablet 25 mg  25 mg Oral TID PRN [place] Drilling, NP       hydrOXYzine  (ATARAX ) tablet 25 mg  25 mg Oral TID PRN [place] Drilling, NP   25 mg at 06/01/24 0119   magnesium  hydroxide (MILK OF MAGNESIA) suspension 30 mL  30 mL Oral Daily PRN [place] Drilling, NP       magnesium  hydroxide (MILK OF MAGNESIA) suspension 30 mL  30 mL Oral Daily PRN [place] Drilling, NP       multivitamin with minerals tablet 1 tablet  1 tablet Oral Daily Nkwenti, Doris, NP   1 tablet at 06/01/24 9197   nicotine  polacrilex (NICORETTE ) gum 2 mg  2 mg Oral PRN Parker, Alvin S, MD   2 mg at 06/01/24 9386   traZODone  (DESYREL ) tablet 50 mg  50 mg Oral QHS PRN [place] Drilling, NP       traZODone  (DESYREL ) tablet 50 mg  50 mg Oral QHS PRN [place] Drilling, NP   50 mg at 05/29/24 2058   Lab Results:  Results for orders placed or performed during the hospital encounter of 05/28/24 (from the past 48 hours)  Folate     Status: None   Collection Time: 05/30/24  6:32 PM  Result Value Ref Range   Folate 12.6 >5.9 ng/mL    Comment: Performed at [facility], [address] [place], [place] [address]  RPR     Status: None   Collection Time: 05/30/24  6:32 PM  Result Value Ref Range   RPR Ser Ql NON REACTIVE NON REACTIVE    Comment: Performed at [facility], [address], [place], [place] [address]  Vitamin B12     Status: None   Collection Time: 05/30/24  6:32 PM  Result Value Ref Range   Vitamin B-12 264 180 - 914 pg/mL    Comment: (NOTE) This assay is not validated for testing neonatal or myeloproliferative syndrome specimens for Vitamin B12 levels. Performed at [facility], [address], [place], [place] [address]   VITAMIN D  25 Hydroxy (Vit-D Deficiency,  Fractures)     Status: None   Collection Time: 05/30/24  6:32 PM  Result Value Ref Range   Vit D, 25-Hydroxy 30.27 30 - 100 ng/mL    Comment: (NOTE) Vitamin D  deficiency has been defined by the Institute of Medicine  and an Endocrine Society practice guideline as a level of serum 25-OH  vitamin D  less than 20 ng/mL (1,2). The Endocrine Society went on to  further define vitamin D  insufficiency as a level between 21 and 29  ng/mL (2).  1. IOM (Institute of Medicine). 2010. Dietary reference intakes for  calcium and D. [place]: [employer]. 2. Holick MF, Binkley Griffith, Bischoff-Ferrari HA, et al. Evaluation,  treatment, and prevention of vitamin D  deficiency: an Endocrine  Society clinical practice guideline, JCEM. 2011 Jul; 96(7): 1911-30.  Performed at [facility], [address], [place], [place] [address]   HIV Antibody (routine testing w rflx)     Status: None   Collection Time: 05/30/24  6:32 PM  Result Value Ref Range   HIV Screen 4th Generation wRfx Non Reactive Non Reactive    Comment: Performed at [facility], [address] [place], [place] [address]   Blood Alcohol  level:  Lab Results  Component Value Date   ETH 292 (H) 05/28/2024   ETH 370 (HH) 04/13/2024   Metabolic Disorder Labs: Lab Results  Component Value Date   HGBA1C 5.1 05/28/2024   MPG 99.67 05/28/2024   No results found for: PROLACTIN Lab Results  Component Value Date   CHOL 266 (H) 05/28/2024   TRIG 277 (H) 05/28/2024   HDL 71 05/28/2024   CHOLHDL 3.7 05/28/2024   VLDL 55 (H) 05/28/2024   LDLCALC 140 (H) 05/28/2024   LDLCALC 141 (H) 10/14/2013   Physical Findings: AIMS:  ,  ,  ,  ,  ,  ,   CIWA:  CIWA-Ar Total: 1 COWS:     Musculoskeletal: Strength & Muscle Tone: within normal limits Gait & Station: normal Patient leans: N/A  Psychiatric Specialty Exam:  Presentation  General Appearance:  Appropriate for Environment; Casual  Eye  Contact: Good  Speech: Clear and Coherent  Speech Volume: Normal  Handedness: Right  Mood and Affect  Mood: Euthymic  Affect: Congruent  Thought Process  Thought Processes: Coherent; Goal Directed  Descriptions of Associations:Intact  Orientation:Full (Time, Place and Person)  Thought Content:Logical  History of Schizophrenia/Schizoaffective disorder:No  Duration of Psychotic Symptoms:No data recorded Hallucinations:Hallucinations: None  Ideas of Reference:None  Suicidal Thoughts:Suicidal Thoughts: No SI Active Intent and/or Plan: -- (Denies)  Homicidal Thoughts:Homicidal Thoughts: No  Sensorium  Memory: Immediate Good; Recent Good  Judgment: Fair  Insight: Fair  Art therapist  Concentration: Good  Attention Span: Good  Recall: Fair  Fund of Knowledge: Fair  Language: Good  Psychomotor Activity  Psychomotor Activity: Psychomotor Activity: Normal  Assets  Assets: Communication Skills; Physical Health; Resilience; Desire for Improvement  Sleep  Sleep: Sleep: Good Number of Hours of Sleep: 6.5  Physical Exam: Physical  Exam Vitals and nursing note reviewed.  Constitutional:      Appearance: He is normal weight.  HENT:     Head: Normocephalic.     Right Ear: External ear normal.     Left Ear: External ear normal.     Nose: Nose normal.     Mouth/Throat:     Mouth: Mucous membranes are moist.     Pharynx: Oropharynx is clear.  Eyes:     Extraocular Movements: Extraocular movements intact.  Cardiovascular:     Rate and Rhythm: Normal rate.     Pulses: Normal pulses.  Pulmonary:     Effort: Pulmonary effort is normal.  Abdominal:     Comments: Deferred  Genitourinary:    Comments: Deferred Musculoskeletal:        General: Normal range of motion.     Cervical back: Normal range of motion.  Skin:    General: Skin is dry.  Neurological:     General: No focal deficit present.     Mental Status: He is alert and  oriented to person, place, and time.  Psychiatric:        Mood and Affect: Mood normal.        Behavior: Behavior normal.    Review of Systems  Constitutional:  Negative for chills and fever.  HENT:  Negative for sore throat.   Eyes:  Negative for blurred vision.  Respiratory:  Negative for cough, sputum production, shortness of breath and wheezing.   Cardiovascular:  Negative for chest pain and palpitations.  Gastrointestinal:  Negative for abdominal pain, constipation, diarrhea, heartburn, nausea and vomiting.  Genitourinary:  Negative for dysuria, frequency and urgency.  Musculoskeletal:  Negative for falls.  Skin:  Negative for itching and rash.  Neurological:  Negative for dizziness and headaches.  Endo/Heme/Allergies:        See allergy listing  Psychiatric/Behavioral:  Positive for depression and substance abuse. Negative for hallucinations and suicidal ideas. The patient is nervous/anxious. The patient does not have insomnia.    Blood pressure 114/89, pulse 82, temperature 97.8 F (36.6 C), temperature source Oral, resp. rate 20, height 5' 9 (1.753 m), weight 69.1 kg, SpO2 97%. Body mass index is 22.51 kg/m.  Treatment Plan Summary: Daily contact with patient to assess and evaluate symptoms and progress in treatment and Medication management.   Principal/active diagnoses.  Plan: The risks/benefits/side-effects/alternatives to the medications in use were discussed in detail with the patient and time was given for patient's questions. The patient consents to medication trial.   Other PRNS -Continue Tylenol  650 mg every 6 hours PRN for mild pain -Continue Maalox 30 ml Q 4 hrs PRN for indigestion -Continue MOM 30 ml po Q 6 hrs for constipation  Safety and Monitoring: Voluntary admission to inpatient psychiatric unit for safety, stabilization and treatment Daily contact with patient to assess and evaluate symptoms and progress in treatment Patient's case to be discussed in  multi-disciplinary team meeting Observation Level : q15 minute checks Vital signs: q12 hours Precautions: Safety  Discharge Planning: Social work and case management to assist with discharge planning and identification of hospital follow-up needs prior to discharge Estimated LOS: 5-7 days Discharge Concerns: Need to establish a safety plan; Medication compliance and effectiveness Discharge Goals: Return home with outpatient referrals for mental health follow-up including medication management/psychotherapy    Mac Bolster, NP 06/01/2024, 9:57 AM Patient ID: [name], male   DOB: 1971-06-16, 53 y.o.   MRN: [id number] Patient ID: [name],  male   DOB: [birth year], 53 y.o.   MRN: [id number]

## 2024-06-01 NOTE — Group Note (Signed)
 Date:  06/01/2024 Time:  9:39 AM  Group Topic/Focus:  Goals Group:   The focus of this group is to help patients establish daily goals to achieve during treatment and discuss how the patient can incorporate goal setting into their daily lives to aide in recovery.    Participation Level:  Active  Participation Quality:  Appropriate  Affect:  Appropriate  Cognitive:  Appropriate  Insight: Appropriate  Engagement in Group:  Engaged  Modes of Intervention:  Discussion  Additional Comments:  pt plans to speak with the doctors about his discharge  Nat Rummer 06/01/2024, 9:39 AM

## 2024-06-01 NOTE — Progress Notes (Signed)
(  Sleep Hours) - 6.25 (Any PRNs that were needed, meds refused, or side effects to meds)- PRN vistaril  25 mg given for anxiety, no meds refused.  (Any disturbances and when (visitation, over night)- None  (Concerns raised by the patient)- Pt complains of recurring nightmares related to PTSD.  (SI/HI/AVH)- Denies SI/HI/AVH

## 2024-06-01 NOTE — Plan of Care (Signed)
   Problem: Education: Goal: Emotional status will improve Outcome: Progressing Goal: Mental status will improve Outcome: Progressing Goal: Verbalization of understanding the information provided will improve Outcome: Progressing   Problem: Activity: Goal: Interest or engagement in activities will improve Outcome: Progressing Goal: Sleeping patterns will improve Outcome: Progressing

## 2024-06-01 NOTE — Plan of Care (Signed)
   Problem: Education: Goal: Emotional status will improve Outcome: Progressing Goal: Mental status will improve Outcome: Progressing Goal: Verbalization of understanding the information provided will improve Outcome: Progressing

## 2024-06-01 NOTE — Progress Notes (Signed)
   06/01/24 1022  Psychosocial Assessment  Patient Complaints None  Eye Contact Fair  Facial Expression Animated  Affect Appropriate to circumstance  Speech Logical/coherent  Interaction Assertive  Motor Activity Other (Comment) (WDL)  Appearance/Hygiene Unremarkable  Behavior Characteristics Cooperative  Mood Pleasant  Thought Process  Coherency WDL  Content WDL  Delusions None reported or observed  Perception WDL  Hallucination None reported or observed  Judgment WDL  Confusion None  Danger to Self  Current suicidal ideation? Denies  Danger to Others  Danger to Others None reported or observed

## 2024-06-02 NOTE — BH Assessment (Signed)
.(  Sleep Hours) -7 (Any PRNs that were needed, meds refused, or side effects to meds)- 0 (Any disturbances and when (visitation, over night)-0 (Concerns raised by the patient)- 0 (SI/HI/AVH)-Denies

## 2024-06-02 NOTE — Discharge Summary (Addendum)
 Addended today to update discharge date to correct date. 7:49 AM 06/13/24   Physician Discharge Summary Note  Patient:  [name] is an 53 y.o., male MRN:  [id number] DOB:  08/28/1971 Patient phone:  [phone] (home)  Patient address:   [address] [place] [address],  Total Time spent with patient: 45 minutes  Date of Admission:  05/28/2024  Date of Discharge: 06-02-24  Reason for Admission: Patient was believed to be endanger to himself.  Principal Problem: PTSD (post-traumatic stress disorder)  Discharge Diagnoses: Principal Problem:   PTSD (post-traumatic stress disorder) Active Problems:   Alcohol  use disorder, severe, dependence (HCC)   Alcohol -induced mood disorder (HCC)  Past Psychiatric History: See H&P.  Past Medical History:  Past Medical History:  Diagnosis Date   Active smoker    Alcohol  abuse    Anxiety    Bipolar 2 disorder (HCC)    Depression    PTSD (post-traumatic stress disorder)     Past Surgical History:  Procedure Laterality Date   HERNIA REPAIR     LYMPH NODE DISSECTION     Family History:  Family History  Problem Relation Age of Onset   Alcohol  abuse Father    Family Psychiatric  History: See H&P. Social History:  Social History   Substance and Sexual Activity  Alcohol  Use Not Currently   Comment: Patient states he is a binge drinker. Last drink 06/09/23     Social History   Substance and Sexual Activity  Drug Use Yes   Types: Marijuana    Social History   Socioeconomic History   Marital status: Legally Separated    Spouse name: Not on file   Number of children: Not on file   Years of education: Not on file   Highest education level: Not on file  Occupational History   Not on file  Tobacco Use   Smoking status: Every Day    Current packs/day: 2.00    Average packs/day: 2.0 packs/day for 20.0 years (40.0 ttl pk-yrs)    Types: Cigarettes   Smokeless tobacco: Never  Vaping Use   Vaping status: Never  Used  Substance and Sexual Activity   Alcohol  use: Not Currently    Comment: Patient states he is a binge drinker. Last drink 06/09/23   Drug use: Yes    Types: Marijuana   Sexual activity: Yes  Other Topics Concern   Not on file  Social History Narrative   Has an ex-wife and 31 yo son whom he cannot see now, as his ex-wife divorced him. Was in the [employer] and went to [place], [place], and may have some PTSD symptoms from this. Was also a Management consultant. Heavy alcohol  abuse during binges, but has had periods of sobriety for months on end. Has been drinking up to two cases of beer a day   Social Drivers of Health   Financial Resource Strain: Not on file  Food Insecurity: Patient Unable To Answer (05/28/2024)   Hunger Vital Sign    Worried About Running Out of Food in the Last Year: Patient unable to answer    Ran Out of Food in the Last Year: Patient unable to answer  Transportation Needs: Unmet Transportation Needs (05/28/2024)   PRAPARE - Administrator, Civil Service (Medical): Yes    Lack of Transportation (Non-Medical): Patient unable to answer  Physical Activity: Not on file  Stress: Not on file  Social Connections: Unknown (10/26/2023)   Social Connection and Isolation  Panel    Frequency of Communication with Friends and Family: Not on file    Frequency of Social Gatherings with Friends and Family: Not on file    Attends Religious Services: Not on file    Active Member of Clubs or Organizations: Not on file    Attends Banker Meetings: Not on file    Marital Status: Divorced   Hospital Course: (Per admission evaluation notes): 53 y/o male with a AUD and PTSD and multiple prior ED and hospital visits for alcohol  use who presented to [facility] on 8/23 under police escort after his GF petitioned for IVC for danger to self. At [facility] his BAL was 292. During assessment he is unable to provide any recollection of his behavior while at [facility].   Upon the decision by his  treatment team to discharge [name] today, he was seen & evaluated for mood stability. The current laboratory findings were reviewed, stable. The nurses notes & vital signs were reviewed as well. All are stable. At this present time, there are no current mental health or medical issues that should prevent this discharge at this time. Patient is being discharged to continue mental health care & medication management as noted below. He is also aware & agreeable to this discharge.  After his admission evaluation with the knowledge that patient needed crisis intervention/medication management for his presenting symptoms, [name] was informed/explained his tentative treatment plan for his consent to start medication management for his symptoms. However, he declined to be on any medications citing that he had been [facility] but did not benefit from the treatment. He stated that those medications caused him bad side effects & were stopped. And with that said, he denied any symptoms of depression or anxiety. He did agree to alcohol  detoxification treatments & received the CIWA detoxification protocols. He was also enrolled & participated in the group counseling sessions being offered & help on this unit. He learned coping skills.  During the course of this hospitalization, [name] was visible on the unit during the day & in bed at night. He participated in the group counseling sessions being offered & held on this unit. He learned coping skills. There were no disruptive behaviors displayed throughout this hospitalization. He ate well & slept well without any complaints. And because he presented no behavioral issues during her hospital stay, he was not a candidate for a forced medications recommendations or order.   [name] is seen daily for follow-up evaluation by her by his provider. He maintained that he is not feeling depressed or anxious. He maintained  that he is not hearing voices or seeing things others were unable to hear or see. He maintained that he is not experiencing any delusional thinking.  He denied any suicidal or homicidal thoughts. He denied any thoughts of violence. He reported normal biological functions. The nursing staff reports that patient has been appropriate on the unit & no behavioral issues noted. Patient has not voiced any suicidal thoughts. Patient has not been adherent with treatment recommendations.    Patient was discussed at the team meeting this yesterday afternoon. Team members feel that patient is probably at his baseline level of function. The team agrees with plan to discharge patient today to continue mental health care on an outpatient basis with the [facility]. Upon discharge, patient adamantly denies any SIHI, AVH, delusional thoughts or paranoia. He was able to engage in safety planning including  plan to return to [facility] or contact emergency services if she feels unable to maintain her own safety or the safety of others. Pt had no further questions, comments, or concerns. He left [facility] with all personal belongings in no apparent distress. Transportation per taxi cab. [facility] assisted with bus pass.  Physical Findings: AIMS:  , ,  ,  ,  ,  ,   CIWA:  CIWA-Ar Total: 0 COWS:     Musculoskeletal: Strength & Muscle Tone: within normal limits Gait & Station: normal Patient leans: N/A   Psychiatric Specialty Exam:  Presentation  General Appearance:  Appropriate for Environment; Casual; Fairly Groomed  Eye Contact: Good  Speech: Clear and Coherent  Speech Volume: Normal  Handedness: Right   Mood and Affect  Mood: Euthymic  Affect: Congruent   Thought Process  Thought Processes: Coherent; Goal Directed  Descriptions of Associations:Intact  Orientation:Full (Time, Place and Person)  Thought Content:Logical  History of Schizophrenia/Schizoaffective disorder:No  Duration of Psychotic Symptoms:No data  recorded  Hallucinations:No data recorded  Ideas of Reference:None  Suicidal Thoughts:No data recorded  Homicidal Thoughts:No data recorded  Sensorium  Memory: Immediate Good; Recent Good  Judgment: Fair  Insight: Fair  Art therapist  Concentration: Good  Attention Span: Good  Recall: Fair  Fund of Knowledge: Fair  Language: Good  Psychomotor Activity  Psychomotor Activity:No data recorded  Assets  Assets: Communication Skills; Physical Health; Resilience; Desire for Improvement  Sleep  Sleep:No data recorded Estimated Sleeping Duration (Last 24 Hours): 6.25-6.75 hours  Physical Exam: Physical Exam Vitals and nursing note reviewed.  HENT:     Head: Normocephalic.     Nose: Nose normal.     Mouth/Throat:     Pharynx: Oropharynx is clear.  Cardiovascular:     Rate and Rhythm: Normal rate.     Pulses: Normal pulses.  Pulmonary:     Effort: Pulmonary effort is normal.  Genitourinary:    Comments: Deferred Musculoskeletal:        General: Normal range of motion.     Cervical back: Normal range of motion.  Skin:    General: Skin is dry.  Neurological:     General: No focal deficit present.     Mental Status: He is alert and oriented to person, place, and time.    Review of Systems  Constitutional:  Negative for chills, diaphoresis and fever.  HENT:  Negative for congestion and sore throat.   Respiratory:  Negative for cough, shortness of breath and wheezing.   Cardiovascular:  Negative for chest pain and palpitations.  Gastrointestinal:  Negative for abdominal pain, constipation, diarrhea, heartburn, nausea and vomiting.  Genitourinary:  Negative for dysuria.  Musculoskeletal:  Negative for falls, joint pain and myalgias.  Neurological:  Negative for dizziness, tingling, tremors, sensory change, speech change, focal weakness, seizures, loss of consciousness, weakness and headaches.  Endo/Heme/Allergies:        Allergies: Effexor,  Paroxetine.  Psychiatric/Behavioral:  Positive for substance abuse (Hx alcohol  use disorder.). Negative for depression, hallucinations, memory loss and suicidal ideas. The patient is not nervous/anxious and does not have insomnia.    Blood pressure 124/88, pulse 71, temperature 98.3 F (36.8 C), temperature source Oral, resp. rate 18, height 5' 9 (1.753 m), weight 69.1 kg, SpO2 98%. Body mass index is 22.51 kg/m.   Social History   Tobacco Use  Smoking Status Every Day   Current packs/day: 2.00   Average packs/day: 2.0 packs/day for 20.0 years (40.0 ttl pk-yrs)   Types:  Cigarettes  Smokeless Tobacco Never   Tobacco Cessation:  N/A, patient does not currently use tobacco products  Blood Alcohol  level:  Lab Results  Component Value Date   ETH 292 (H) 05/28/2024   ETH 370 (HH) 04/13/2024   Metabolic Disorder Labs:  Lab Results  Component Value Date   HGBA1C 5.1 05/28/2024   MPG 99.67 05/28/2024   No results found for: PROLACTIN Lab Results  Component Value Date   CHOL 266 (H) 05/28/2024   TRIG 277 (H) 05/28/2024   HDL 71 05/28/2024   CHOLHDL 3.7 05/28/2024   VLDL 55 (H) 05/28/2024   LDLCALC 140 (H) 05/28/2024   LDLCALC 141 (H) 10/14/2013   See Psychiatric Specialty Exam and Suicide Risk Assessment completed by Attending Physician prior to discharge.  Discharge destination:  Home  Is patient on multiple antipsychotic therapies at discharge:  No   Has Patient had three or more failed trials of antipsychotic monotherapy by history:  No  Recommended Plan for Multiple Antipsychotic Therapies: NA  Allergies as of 06/02/2024       Reactions   Effexor [venlafaxine] Hives   Paroxetine Hives, Rash        Medication List     STOP taking these medications    folic acid  1 MG tablet Commonly known as: FOLVITE    thiamine  100 MG tablet Commonly known as: VITAMIN B1       TAKE these medications      Indication  ezetimibe 10 MG tablet Commonly known as:  ZETIA Take 10 mg by mouth daily.  Indication: High Amount of Fats in the Blood        Follow-up Information     [facility] on 06/14/2024.   Why: You have a nurse call appointment on  06/14/24 at 10:00 am.   You have a hospital follow up appointment for therapy and medication management services on 06/27/24 at 11:00 am, in person. Contact information: [address] [facility] [place] [address] [phone]                 Plan Of Care/Follow-up recommendations:  Activity: as tolerated Diet: heart healthy  Other: -Follow-up with your outpatient psychiatric provider -instructions on appointment date, time, and address (location) are provided to you in discharge paperwork.  -Take your psychiatric medications as prescribed at discharge - instructions are provided to you in the discharge paperwork  -Follow-up with outpatient primary care doctor and other specialists -for management of preventative medicine and chronic medical issues  -Testing: Follow-up with outpatient provider for abnormal lab results: NA  -If you are prescribed an atypical antipsychotic medication, we recommend that your outpatient psychiatrist follow routine screening for side effects within 3 months of discharge, including monitoring: AIMS scale, height, weight, blood pressure, fasting lipid panel, HbA1c, and fasting blood sugar.   -Recommend total abstinence from alcohol , tobacco, and other illicit drug use at discharge.   -If your psychiatric symptoms recur, worsen, or if you have side effects to your psychiatric medications, call your outpatient psychiatric provider, 911, [phone] or go to the nearest emergency department.  -If suicidal thoughts occur, immediately call your outpatient psychiatric provider, 911, [phone] or go to the nearest emergency department. Signed: Mac Bolster, NP, pmhnp, fnp-bc. 06/02/2024, 5:38 PM

## 2024-06-02 NOTE — [facility] Suicide Risk Assessment (Signed)
 Suicide Risk Assessment  Discharge Assessment    [facility] Discharge Suicide Risk Assessment   Principal Problem: PTSD (post-traumatic stress disorder)  Discharge Diagnoses: Principal Problem:   PTSD (post-traumatic stress disorder) Active Problems:   Alcohol  use disorder, severe, dependence (HCC)   Alcohol -induced mood disorder (HCC)  Total Time spent with patient: 45 minutes  Musculoskeletal: Strength & Muscle Tone: within normal limits Gait & Station: normal Patient leans: N/A  Psychiatric Specialty Exam  Presentation  General Appearance:  Appropriate for Environment; Casual  Eye Contact: Good  Speech: Clear and Coherent  Speech Volume: Normal  Handedness: Right   Mood and Affect  Mood: Euthymic  Duration of Depression Symptoms: Greater than two weeks  Affect: Congruent   Thought Process  Thought Processes: Coherent; Goal Directed  Descriptions of Associations:Intact  Orientation:Full (Time, Place and Person)  Thought Content:Logical  History of Schizophrenia/Schizoaffective disorder:No  Duration of Psychotic Symptoms:No data recorded Hallucinations:No data recorded Ideas of Reference:None  Suicidal Thoughts:No data recorded Homicidal Thoughts:No data recorded  Sensorium  Memory: Immediate Good; Recent Good  Judgment: Fair  Insight: Fair   Art therapist  Concentration: Good  Attention Span: Good  Recall: Fair  Fund of Knowledge: Fair  Language: Good   Psychomotor Activity  Psychomotor Activity:No data recorded  Assets  Assets: Communication Skills; Physical Health; Resilience; Desire for Improvement  Sleep  Sleep:No data recorded Estimated Sleeping Duration (Last 24 Hours): 6.25-6.75 hours  Physical/Ros Exam:See H&P.  Blood pressure 124/88, pulse 71, temperature 98.3 F (36.8 C), temperature source Oral, resp. rate 18, height 5' 9 (1.753 m), weight 69.1 kg, SpO2 98%. Body mass index is 22.51  kg/m.  Mental Status Per Nursing Assessment::   On Admission:     Demographic Factors:  Male and Adolescent or young adult  Loss Factors: NA  Historical Factors: NA  Risk Reduction Factors:   Sense of responsibility to family, Living with another person, especially a relative, Positive social support, Positive therapeutic relationship, and Positive coping skills or problem solving skills  Continued Clinical Symptoms:  Alcohol /Substance Abuse/Dependencies Previous Psychiatric Diagnoses and Treatments  Cognitive Features That Contribute To Risk:  Thought constriction (tunnel vision)    Suicide Risk:  Minimal: No identifiable suicidal ideation.  Patients presenting with no risk factors but with morbid ruminations; may be classified as minimal risk based on the severity of the depressive symptoms   Follow-up Information     [facility] on 06/14/2024.   Why: You have a nurse call appointment on  06/14/24 at 10:00 am.   You have a hospital follow up appointment for therapy and medication management services on 06/27/24 at 11:00 am, in person. Contact information: [address] [facility] [place] [address] [phone]                Plan Of Care/Follow-up recommendations:  See the discharge recommendations above.  Mac Bolster, NP, pmhnp, fnp-bc. 06/02/2024, 10:10 AM

## 2024-06-02 NOTE — [facility] Group Notes (Signed)
 Spiritual care group facilitated by Chaplain Rockie Sofia, [facility]  Group focused on topic of strength. Group members reflected on what thoughts and feelings emerge when they hear this topic. They then engaged in facilitated dialog around how strength is present in their lives. This dialog focused on representing what strength had been to them in their lives (images and patterns given) and what they saw as helpful in their life now (what they needed / wanted).  Activity drew on narrative framework.  Patient Progress: [name] attended group and actively engaged and participated in group conversation and activities.

## 2024-06-02 NOTE — Transportation (Signed)
 06/02/2024  [name] DOB: 10/24/1970 MRN: [id number]   RIDER WAIVER AND RELEASE OF LIABILITY  For the purposes of helping with transportation needs, [facility] partners with outside transportation providers (taxi companies, [place], Catering manager.) to give [facility] patients or other approved people the choice of on-demand rides Public librarian) to our buildings for non-emergency visits.  By using [employer], I, the person signing this document, on behalf of myself and/or any legal minors (in my care using the [employer]), agree:  Science writer given to me are supplied by independent, outside transportation providers who do not work for, or have any affiliation with, [employer]. [place] is not a transportation company. [facility] has no control over the quality or safety of the rides I get using [employer]. [facility] has no control over whether any outside ride will happen on time or not. [facility] gives no guarantee on the reliability, quality, safety, or availability on any rides, or that no mistakes will happen. I know and accept that traveling by vehicle (car, truck, SVU, fleeta, bus, taxi, etc.) has risks of serious injuries such as disability, being paralyzed, and death. I know and agree the risk of using [employer] is mine alone, and not [employer]. [employer] are provided as is and as are available. The transportation providers are in charge for all inspections and care of the vehicles used to provide these rides. I agree not to take legal action against [facility], its agents, employees, officers, directors, representatives, insurers, attorneys, assigns, successors, subsidiaries, and affiliates at any time for any reasons related directly or indirectly to using [employer]. I also agree not to take legal action against [facility] or its affiliates for any injury, death, or damage to property caused by or related to using  [employer]. I have read this Waiver and Release of Liability, and I understand the terms used in it and their legal meaning. This Waiver is freely and voluntarily given with the understanding that my right (or any legal minors) to legal action against [facility] relating to [employer] is knowingly given up to use these services.   I attest that I read the Ride Waiver and Release of Liability to [name], gave Mr. Aloisi the opportunity to ask questions and answered the questions asked (if any). I affirm that [name] then provided consent for assistance with transportation.

## 2024-06-02 NOTE — Plan of Care (Signed)
   Problem: Education: Goal: Knowledge of [facility] Education information/materials will improve Outcome: Progressing Goal: Emotional status will improve Outcome: Progressing Goal: Mental status will improve Outcome: Progressing Goal: Verbalization of understanding the information provided will improve Outcome: Progressing

## 2024-06-02 NOTE — Progress Notes (Signed)
   06/02/24 0600  Psych Admission Type (Psych Patients Only)  Admission Status Involuntary  Psychosocial Assessment  Patient Complaints None  Eye Contact Fair  Facial Expression Animated  Affect Appropriate to circumstance  Speech Logical/coherent  Interaction Assertive  Motor Activity Other (Comment)  Appearance/Hygiene Unremarkable  Behavior Characteristics Appropriate to situation;Cooperative  Mood Pleasant  Aggressive Behavior  Effect No apparent injury  Thought Process  Coherency WDL  Content WDL  Delusions None reported or observed  Perception WDL  Hallucination None reported or observed  Judgment WDL  Confusion WDL  Danger to Self  Current suicidal ideation? Denies  Danger to Others  Danger to Others None reported or observed

## 2024-06-02 NOTE — Progress Notes (Signed)
 Patient verbalizes readiness for discharge. All patient belongings returned to patient. Discharge instructions read and discussed with patient (appointments, medications, resources). Patient expressed gratitude for care provided. Patient discharged to lobby at 1315 with taxi voucher.

## 2024-06-02 NOTE — Progress Notes (Signed)
  [facility] Adult Case Management Discharge Plan :  Will you be returning to the same living situation after discharge:  Yes,  pt returning back home at discharge At discharge, do you have transportation home?: Yes,  CSW arranged [employer] taxi for 100PM Do you have the ability to pay for your medications: Yes,  pt has active health insurance coverage through the [facility]  Release of information consent forms completed and in the chart;  Patient's signature needed at discharge.  Patient to Follow up at:  Follow-up Information     [facility] on 06/14/2024.   Why: You have a nurse call appointment on  06/14/24 at 10:00 am.   You have a hospital follow up appointment for therapy and medication management services on 06/27/24 at 11:00 am, in person. Contact information: [address] [facility] [place] [address] [phone]                 Next level of care provider has access to [facility] Link:no  Safety Planning and Suicide Prevention discussed: Yes,  given to pt at discharge, declined consents to speak with family/friend     Has patient been referred to the Quitline?: Patient refused referral for treatment  Patient has been referred for addiction treatment: Patient refused referral for treatment.  Jenkins LULLA Primer, LCSWA 06/02/2024, 8:58 AM

## 2024-06-03 NOTE — Progress Notes (Signed)
 LCSW faxed Discharge Summary to [facility] [phone].

## 2024-06-18 ENCOUNTER — Emergency Department (HOSPITAL_COMMUNITY)
Admission: EM | Admit: 2024-06-18 | Discharge: 2024-06-19 | Disposition: A | Discharge status: Home / Self Care | Attending: Emergency Medicine | Admitting: Emergency Medicine

## 2024-06-18 DIAGNOSIS — F10921 Alcohol use, unspecified with intoxication delirium: Secondary | ICD-10-CM

## 2024-06-18 DIAGNOSIS — F10121 Alcohol abuse with intoxication delirium: Secondary | ICD-10-CM | POA: Insufficient documentation

## 2024-06-18 DIAGNOSIS — R000 Tachycardia, unspecified: Secondary | ICD-10-CM | POA: Diagnosis not present

## 2024-06-18 DIAGNOSIS — F10129 Alcohol abuse with intoxication, unspecified: Secondary | ICD-10-CM | POA: Diagnosis present

## 2024-06-18 DIAGNOSIS — Y908 Blood alcohol level of 240 mg/100 ml or more: Secondary | ICD-10-CM | POA: Diagnosis not present

## 2024-06-18 LAB — COMPREHENSIVE METABOLIC PANEL WITH GFR
ALT: 69 U/L — ABNORMAL HIGH (ref 0–44)
AST: 109 U/L — ABNORMAL HIGH (ref 15–41)
Albumin: 4.7 g/dL (ref 3.5–5.0)
Alkaline Phosphatase: 91 U/L (ref 38–126)
Anion gap: 19 — ABNORMAL HIGH (ref 5–15)
BUN: 8 mg/dL (ref 6–20)
CO2: 22 mmol/L (ref 22–32)
Calcium: 8.8 mg/dL — ABNORMAL LOW (ref 8.9–10.3)
Chloride: 100 mmol/L (ref 98–111)
Creatinine, Ser: 0.67 mg/dL (ref 0.61–1.24)
GFR, Estimated: >60 mL/min (ref >60)
Glucose, Bld: 111 mg/dL — ABNORMAL HIGH (ref 70–99)
Potassium: 3.9 mmol/L (ref 3.5–5.1)
Sodium: 141 mmol/L (ref 135–145)
Total Bilirubin: 0.6 mg/dL (ref 0.0–1.2)
Total Protein: 7.7 g/dL (ref 6.5–8.1)

## 2024-06-18 LAB — CBC WITH DIFFERENTIAL/PLATELET
Abs Immature Granulocytes: 0.02 K/uL (ref 0.00–0.07)
Basophils Absolute: 0.1 K/uL (ref 0.0–0.1)
Basophils Relative: 1 %
Eosinophils Absolute: 0 K/uL (ref 0.0–0.5)
Eosinophils Relative: 1 %
HCT: 43.4 % (ref 39.0–52.0)
Hemoglobin: 14.9 g/dL (ref 13.0–17.0)
Immature Granulocytes: 0 %
Lymphocytes Relative: 39 %
Lymphs Abs: 2.8 K/uL (ref 0.7–4.0)
MCH: 27.5 pg (ref 26.0–34.0)
MCHC: 34.3 g/dL (ref 30.0–36.0)
MCV: 80.2 fL (ref 80.0–100.0)
Monocytes Absolute: 0.5 K/uL (ref 0.1–1.0)
Monocytes Relative: 6 %
Neutro Abs: 3.8 K/uL (ref 1.7–7.7)
Neutrophils Relative %: 53 %
Platelets: 252 K/uL (ref 150–400)
RBC: 5.41 MIL/uL (ref 4.22–5.81)
RDW: 15.4 % (ref 11.5–15.5)
WBC: 7.2 K/uL (ref 4.0–10.5)
nRBC: 0 % (ref 0.0–0.2)

## 2024-06-18 LAB — URINE DRUG SCREEN
Amphetamines: NEGATIVE
Barbiturates: NEGATIVE
Benzodiazepines: NEGATIVE
Cocaine: NEGATIVE
Fentanyl: NEGATIVE
Methadone Scn, Ur: NEGATIVE
Opiates: NEGATIVE
Tetrahydrocannabinol: NEGATIVE

## 2024-06-18 LAB — ETHANOL: Alcohol, Ethyl (B): 391 mg/dL (ref <15)

## 2024-06-18 MED ORDER — ONDANSETRON HCL 4 MG/2ML IJ SOLN
4.0000 mg | Freq: Once | INTRAMUSCULAR | Status: AC
  Administered 2024-06-18: 4 mg via INTRAVENOUS
  Filled 2024-06-18: qty 2

## 2024-06-18 MED ORDER — LACTATED RINGERS IV BOLUS
1000.0000 mL | Freq: Once | INTRAVENOUS | Status: AC
  Administered 2024-06-18: 1000 mL via INTRAVENOUS

## 2024-06-18 MED ORDER — THIAMINE HCL 100 MG/ML IJ SOLN
500.0000 mg | Freq: Once | INTRAVENOUS | Status: AC
  Administered 2024-06-18: 500 mg via INTRAVENOUS
  Filled 2024-06-18: qty 5

## 2024-06-18 MED ORDER — LACTATED RINGERS IV BOLUS
1000.0000 mL | Freq: Once | INTRAVENOUS | Status: AC
Start: 2024-06-18 — End: 2024-06-19
  Administered 2024-06-18: 1000 mL via INTRAVENOUS

## 2024-06-18 NOTE — ED Provider Notes (Signed)
 [place] EMERGENCY DEPARTMENT AT [facility] Provider Note   CSN: [id number] Arrival date & time: 06/18/24  8161     Patient presents with: Alcohol  Intoxication   LARICO DIMOCK is a 53 y.o. male.    Alcohol  Intoxication  Patient presents reportedly wanting alcohol  detox.  States he thinks he may be withdrawing now.  Reportedly has been drinking heavily.  States he has PTSD and drinks enough to kill most men.  Has been drinking today but somewhat difficult to quantify.  Does appear intoxicated.  Patient's wife reportedly was here but not here right now.  States he has had seizures in the past.  Reviewing the notes it appears the end of last month was inpatient for psychiatry and went at least 5 days without drinking.     Prior to Admission medications   Not on File    Allergies: Effexor [venlafaxine] and Paroxetine    Review of Systems  Updated Vital Signs BP (!) 128/114   Pulse 82   Temp 98.7 F (37.1 C)   Resp 20   Ht 5' 9 (1.753 m)   Wt 68 kg   SpO2 97%   BMI 22.15 kg/m   Physical Exam Vitals reviewed.  Cardiovascular:     Rate and Rhythm: Tachycardia present.  Abdominal:     Tenderness: There is no abdominal tenderness.  Musculoskeletal:     Cervical back: Neck supple.  Neurological:     Mental Status: He is alert.     Comments: Appears somewhat confused.  Likely intoxicated.     (all labs ordered are listed, but only abnormal results are displayed) Labs Reviewed  COMPREHENSIVE METABOLIC PANEL WITH GFR - Abnormal; Notable for the following components:      Result Value   Glucose, Bld 111 (*)    Calcium 8.8 (*)    AST 109 (*)    ALT 69 (*)    Anion gap 19 (*)    All other components within normal limits  ETHANOL - Abnormal; Notable for the following components:   Alcohol , Ethyl (B) 391 (*)    All other components within normal limits  URINE DRUG SCREEN  CBC WITH DIFFERENTIAL/PLATELET    EKG: EKG  Interpretation Date/Time:  Saturday June 18 2024 20:14:36 EDT Ventricular Rate:  104 PR Interval:  149 QRS Duration:  85 QT Interval:  329 QTC Calculation: 433 R Axis:   -28  Text Interpretation: Sinus tachycardia Borderline left axis deviation Borderline T abnormalities, lateral leads  anterior T waves more prominent than prior. Confirmed by Patsey Lot [phone]) on 06/18/2024 8:47:12 PM  Radiology: No results found.   Procedures   Medications Ordered in the ED  thiamine  (VITAMIN B1) 500 mg in sodium chloride  0.9 % 50 mL IVPB (has no administration in time range)  lactated ringers  bolus 1,000 mL (1,000 mLs Intravenous New Bag/Given 06/18/24 1938)  lactated ringers  bolus 1,000 mL (1,000 mLs Intravenous New Bag/Given 06/18/24 2221)  ondansetron  (ZOFRAN ) injection 4 mg (4 mg Intravenous Given 06/18/24 2226)                                    Medical Decision Making Risk Prescription drug management.   Patient requesting detox.  Reportedly drinking 30 more beers a day.  Reportedly last drink 30 minutes prior to arrival.  Reportedly has had seizures since the DTs in the past although appears to be in  the last month was on oral treatment. Social determinant of health includes his alcoholism.  At this point is not sure if patient is withdrawing versus intoxicated.  Will give fluid bolus.  Will check blood work.  Reviewed recent discharge note from psychiatry.  Alcohol  level was elevated at this time.  390.  Still tachycardic.  Do not think he is in withdrawal at this time.  Will give antiemetics.  Will give second fluid bolus.  Will continue to monitor.  With confusion will give high-dose of thiamine  at this time.  Carefully turned over to Dr. Darra.      Final diagnoses:  Acute alcoholic intoxication with delirium [facility])    ED Discharge Orders     None          Patsey Lot, MD 06/18/24 2239

## 2024-06-18 NOTE — ED Triage Notes (Signed)
 Pt here with wife for alcohol  detox. Pt drinks 30 or more beers daily. Last drink 30 min PTA. Pt has hx of DT's and seizures when withdrawing from alcohol .

## 2024-06-18 NOTE — ED Provider Triage Note (Signed)
 Emergency Medicine Provider Triage Evaluation Note  [name] , a 53 y.o. male  was evaluated in triage.  Pt complains of wanting alcohol  detox.  Has been binge drinking for 9 days straight per wife.  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  Has required admission previously for alcohol  withdrawal.  He is drinking 30 beers per day around-the-clock.  He is been complaining of some feet cramping and wife reports this usually happens when he has low potassium.  Denies any drug use.  Review of Systems  Positive:  Negative:   Physical Exam  BP (!) 146/95 (BP Location: Right Arm)   Pulse (!) 114   Temp 98.2 F (36.8 C) (Oral)   Resp 18   Ht 5' 9 (1.753 m)   Wt 68 kg   SpO2 95%   BMI 22.15 kg/m  Gen:   Obviously intoxicated, tearful Resp:  Normal effort  MSK:   Moves extremities without difficulty  Other:    Medical Decision Making  Medically screening exam initiated at 7:11 PM.  Appropriate orders placed.  [name] was informed that the remainder of the evaluation will be completed by another provider, this initial triage assessment does not replace that evaluation, and the importance of remaining in the ED until their evaluation is complete.  As ordered.  Nursing aware the patient needs to be roomed next.      Bernis Ernst, [place] 06/18/24 8087

## 2024-06-18 NOTE — ED Notes (Signed)
 Pt given sandwich and water . Pt able to eat/drink with no reports of nausea. No emesis noted

## 2024-06-18 NOTE — ED Provider Notes (Signed)
 Blood pressure (!) 128/114, pulse 82, temperature 98.7 F (37.1 C), resp. rate 20, height 5' 9 (1.753 m), weight 68 kg, SpO2 97%.  Assuming care from Dr. Patsey.  In short, [name] is a 53 y.o. male with a chief complaint of Alcohol  Intoxication .  Refer to the original H&P for additional details.  The current plan of care is to follow up after symptom mgmt and observation. Patient arrives with very high EtOH level.  12:16 AM  Patient reevaluated.  He has a very mild tremor.  Blood pressure and heart rate are decreasing.  He clinically still appears to be under the influence of alcohol  although is ambulatory and conversational.  I do not see signs of complicated withdrawal at this time.  12:49 AM  Patient apparently walked out of the emergency department prior to receiving his discharge instructions and prescription.  I did send Librium  to his pharmacy of choice.  He did not appear to be in complicated alcohol  withdrawal.  He does appear sober for discharge and plans to call a Lyft home although would not stay to help us  coordinate this for him.  I do not see an indication for involuntary commitment or restraint at this time.    Darra Fonda MATSU, MD 06/19/24 [phone]

## 2024-06-19 MED ORDER — LORAZEPAM 1 MG PO TABS
1.0000 mg | ORAL_TABLET | Freq: Once | ORAL | Status: AC
  Administered 2024-06-19: 1 mg via ORAL
  Filled 2024-06-19: qty 1

## 2024-06-19 MED ORDER — CHLORDIAZEPOXIDE HCL 25 MG PO CAPS: ORAL_CAPSULE | ORAL | 0 refills | Status: AC

## 2024-06-19 NOTE — Discharge Instructions (Signed)
